# Patient Record
Sex: Female | Born: 1955 | Race: White | Hispanic: No | Marital: Married | Smoking: Former smoker
Health system: Southern US, Community
[De-identification: ages and names within clinical notes are randomized; demographics above are authoritative.]

## PROBLEM LIST (undated history)

## (undated) DIAGNOSIS — Z860100 Personal history of colon polyps, unspecified: Secondary | ICD-10-CM

## (undated) DIAGNOSIS — G4730 Sleep apnea, unspecified: Secondary | ICD-10-CM

## (undated) DIAGNOSIS — F329 Major depressive disorder, single episode, unspecified: Secondary | ICD-10-CM

## (undated) DIAGNOSIS — F32A Depression, unspecified: Secondary | ICD-10-CM

## (undated) DIAGNOSIS — Z9989 Dependence on other enabling machines and devices: Secondary | ICD-10-CM

## (undated) DIAGNOSIS — M1990 Unspecified osteoarthritis, unspecified site: Secondary | ICD-10-CM

## (undated) DIAGNOSIS — I10 Essential (primary) hypertension: Secondary | ICD-10-CM

## (undated) DIAGNOSIS — K760 Fatty (change of) liver, not elsewhere classified: Secondary | ICD-10-CM

## (undated) DIAGNOSIS — K640 First degree hemorrhoids: Secondary | ICD-10-CM

## (undated) DIAGNOSIS — E538 Deficiency of other specified B group vitamins: Secondary | ICD-10-CM

## (undated) DIAGNOSIS — M47816 Spondylosis without myelopathy or radiculopathy, lumbar region: Secondary | ICD-10-CM

## (undated) DIAGNOSIS — M479 Spondylosis, unspecified: Secondary | ICD-10-CM

## (undated) DIAGNOSIS — E039 Hypothyroidism, unspecified: Secondary | ICD-10-CM

## (undated) DIAGNOSIS — J309 Allergic rhinitis, unspecified: Secondary | ICD-10-CM

## (undated) DIAGNOSIS — E7800 Pure hypercholesterolemia, unspecified: Secondary | ICD-10-CM

## (undated) DIAGNOSIS — N200 Calculus of kidney: Secondary | ICD-10-CM

## (undated) DIAGNOSIS — R351 Nocturia: Secondary | ICD-10-CM

## (undated) DIAGNOSIS — Z973 Presence of spectacles and contact lenses: Secondary | ICD-10-CM

## (undated) DIAGNOSIS — K449 Diaphragmatic hernia without obstruction or gangrene: Secondary | ICD-10-CM

## (undated) DIAGNOSIS — G4733 Obstructive sleep apnea (adult) (pediatric): Secondary | ICD-10-CM

## (undated) DIAGNOSIS — G629 Polyneuropathy, unspecified: Secondary | ICD-10-CM

## (undated) DIAGNOSIS — D509 Iron deficiency anemia, unspecified: Secondary | ICD-10-CM

## (undated) DIAGNOSIS — Z87442 Personal history of urinary calculi: Secondary | ICD-10-CM

## (undated) DIAGNOSIS — E119 Type 2 diabetes mellitus without complications: Secondary | ICD-10-CM

## (undated) DIAGNOSIS — J45909 Unspecified asthma, uncomplicated: Secondary | ICD-10-CM

## (undated) DIAGNOSIS — Z85528 Personal history of other malignant neoplasm of kidney: Secondary | ICD-10-CM

## (undated) HISTORY — PX: FRACTURE SURGERY: SHX138

## (undated) HISTORY — PX: OTHER SURGICAL HISTORY: SHX169

## (undated) HISTORY — PX: CARDIOVASCULAR STRESS TEST: SHX262

## (undated) HISTORY — PX: POLYPECTOMY: SHX149

## (undated) HISTORY — DX: Sleep apnea, unspecified: G47.30

## (undated) HISTORY — PX: CARDIAC CATHETERIZATION: SHX172

## (undated) HISTORY — PX: COLONOSCOPY: SHX174

---

## 1991-02-27 HISTORY — PX: KNEE ARTHROSCOPY: SUR90

## 2002-12-29 ENCOUNTER — Other Ambulatory Visit: Admission: RE | Admit: 2002-12-29 | Discharge: 2002-12-29 | Payer: Self-pay | Admitting: Obstetrics and Gynecology

## 2003-01-12 ENCOUNTER — Encounter: Admission: RE | Admit: 2003-01-12 | Discharge: 2003-01-12 | Payer: Self-pay | Admitting: Obstetrics and Gynecology

## 2003-03-24 ENCOUNTER — Encounter: Admission: RE | Admit: 2003-03-24 | Discharge: 2003-03-24 | Payer: Self-pay | Admitting: Chiropractic Medicine

## 2003-12-30 ENCOUNTER — Ambulatory Visit (HOSPITAL_COMMUNITY): Admission: RE | Admit: 2003-12-30 | Discharge: 2003-12-30 | Payer: Self-pay | Admitting: Neurosurgery

## 2003-12-31 ENCOUNTER — Ambulatory Visit: Payer: Self-pay | Admitting: Endocrinology

## 2004-01-03 ENCOUNTER — Other Ambulatory Visit: Admission: RE | Admit: 2004-01-03 | Discharge: 2004-01-03 | Payer: Self-pay | Admitting: Obstetrics and Gynecology

## 2004-01-05 ENCOUNTER — Ambulatory Visit: Payer: Self-pay | Admitting: Endocrinology

## 2004-01-27 ENCOUNTER — Encounter: Admission: RE | Admit: 2004-01-27 | Discharge: 2004-01-27 | Payer: Self-pay | Admitting: Endocrinology

## 2004-04-19 ENCOUNTER — Ambulatory Visit: Payer: Self-pay | Admitting: Internal Medicine

## 2004-08-09 ENCOUNTER — Ambulatory Visit: Payer: Self-pay | Admitting: Endocrinology

## 2004-08-17 ENCOUNTER — Ambulatory Visit: Payer: Self-pay | Admitting: Endocrinology

## 2004-08-30 ENCOUNTER — Ambulatory Visit: Payer: Self-pay

## 2004-08-30 ENCOUNTER — Encounter: Payer: Self-pay | Admitting: Cardiology

## 2004-09-11 ENCOUNTER — Encounter: Payer: Self-pay | Admitting: Pulmonary Disease

## 2004-09-11 ENCOUNTER — Ambulatory Visit (HOSPITAL_BASED_OUTPATIENT_CLINIC_OR_DEPARTMENT_OTHER): Admission: RE | Admit: 2004-09-11 | Discharge: 2004-09-11 | Payer: Self-pay | Admitting: Endocrinology

## 2004-09-18 ENCOUNTER — Ambulatory Visit: Payer: Self-pay | Admitting: Pulmonary Disease

## 2004-11-01 ENCOUNTER — Encounter: Payer: Self-pay | Admitting: Family Medicine

## 2004-11-01 ENCOUNTER — Ambulatory Visit: Payer: Self-pay | Admitting: Pulmonary Disease

## 2004-11-10 ENCOUNTER — Ambulatory Visit: Payer: Self-pay | Admitting: Endocrinology

## 2004-11-16 ENCOUNTER — Ambulatory Visit: Payer: Self-pay | Admitting: Endocrinology

## 2004-12-14 ENCOUNTER — Ambulatory Visit: Payer: Self-pay | Admitting: Endocrinology

## 2004-12-21 ENCOUNTER — Ambulatory Visit: Payer: Self-pay | Admitting: Pulmonary Disease

## 2005-01-05 ENCOUNTER — Other Ambulatory Visit: Admission: RE | Admit: 2005-01-05 | Discharge: 2005-01-05 | Payer: Self-pay | Admitting: Obstetrics and Gynecology

## 2005-01-16 ENCOUNTER — Encounter: Admission: RE | Admit: 2005-01-16 | Discharge: 2005-01-16 | Payer: Self-pay | Admitting: Neurosurgery

## 2005-01-31 ENCOUNTER — Ambulatory Visit: Payer: Self-pay | Admitting: Endocrinology

## 2005-02-02 ENCOUNTER — Encounter: Admission: RE | Admit: 2005-02-02 | Discharge: 2005-02-02 | Payer: Self-pay | Admitting: Neurosurgery

## 2005-02-05 ENCOUNTER — Ambulatory Visit: Payer: Self-pay | Admitting: Pulmonary Disease

## 2005-03-23 ENCOUNTER — Ambulatory Visit: Payer: Self-pay | Admitting: Internal Medicine

## 2005-04-24 ENCOUNTER — Encounter: Admission: RE | Admit: 2005-04-24 | Discharge: 2005-04-24 | Payer: Self-pay | Admitting: Endocrinology

## 2005-08-09 ENCOUNTER — Ambulatory Visit (HOSPITAL_COMMUNITY): Admission: RE | Admit: 2005-08-09 | Discharge: 2005-08-09 | Payer: Self-pay | Admitting: Neurosurgery

## 2005-10-23 ENCOUNTER — Encounter: Admission: RE | Admit: 2005-10-23 | Discharge: 2005-10-23 | Payer: Self-pay | Admitting: Neurosurgery

## 2005-11-13 ENCOUNTER — Encounter: Admission: RE | Admit: 2005-11-13 | Discharge: 2005-11-13 | Payer: Self-pay | Admitting: Neurosurgery

## 2005-11-23 ENCOUNTER — Ambulatory Visit: Payer: Self-pay | Admitting: Endocrinology

## 2005-11-29 ENCOUNTER — Ambulatory Visit: Payer: Self-pay | Admitting: Endocrinology

## 2005-12-27 ENCOUNTER — Ambulatory Visit: Payer: Self-pay | Admitting: Gastroenterology

## 2006-02-22 ENCOUNTER — Ambulatory Visit: Payer: Self-pay | Admitting: Endocrinology

## 2006-04-26 ENCOUNTER — Ambulatory Visit: Payer: Self-pay | Admitting: Family Medicine

## 2006-05-09 ENCOUNTER — Ambulatory Visit: Payer: Self-pay | Admitting: Endocrinology

## 2006-05-22 ENCOUNTER — Ambulatory Visit: Payer: Self-pay | Admitting: Endocrinology

## 2006-07-12 ENCOUNTER — Ambulatory Visit: Payer: Self-pay | Admitting: Internal Medicine

## 2006-09-24 ENCOUNTER — Ambulatory Visit: Payer: Self-pay | Admitting: Endocrinology

## 2006-09-24 LAB — CONVERTED CEMR LAB: TSH: 22.63 microintl units/mL — ABNORMAL HIGH (ref 0.35–5.50)

## 2006-09-27 ENCOUNTER — Encounter: Payer: Self-pay | Admitting: Endocrinology

## 2006-09-27 DIAGNOSIS — I10 Essential (primary) hypertension: Secondary | ICD-10-CM | POA: Insufficient documentation

## 2006-09-27 DIAGNOSIS — J45909 Unspecified asthma, uncomplicated: Secondary | ICD-10-CM | POA: Insufficient documentation

## 2006-10-04 ENCOUNTER — Ambulatory Visit: Payer: Self-pay | Admitting: Endocrinology

## 2006-11-29 ENCOUNTER — Ambulatory Visit: Payer: Self-pay | Admitting: Endocrinology

## 2006-11-29 LAB — CONVERTED CEMR LAB
AST: 56 units/L — ABNORMAL HIGH (ref 0–37)
Bilirubin, Direct: 0.2 mg/dL (ref 0.0–0.3)
Chloride: 101 meq/L (ref 96–112)
Creatinine, Ser: 1.1 mg/dL (ref 0.4–1.2)
Eosinophils Relative: 3.8 % (ref 0.0–5.0)
Glucose, Bld: 129 mg/dL — ABNORMAL HIGH (ref 70–99)
HCT: 39.4 % (ref 36.0–46.0)
Hemoglobin: 13.8 g/dL (ref 12.0–15.0)
Hgb A1c MFr Bld: 7.3 % — ABNORMAL HIGH (ref 4.6–6.0)
LDL Cholesterol: 50 mg/dL (ref 0–99)
MCV: 90.6 fL (ref 78.0–100.0)
Neutrophils Relative %: 66.2 % (ref 43.0–77.0)
RBC: 4.35 M/uL (ref 3.87–5.11)
RDW: 12.5 % (ref 11.5–14.6)
Sodium: 141 meq/L (ref 135–145)
Total Bilirubin: 1.3 mg/dL — ABNORMAL HIGH (ref 0.3–1.2)
Total CHOL/HDL Ratio: 3.3
Total Protein: 7.8 g/dL (ref 6.0–8.3)
WBC: 11.9 10*3/uL — ABNORMAL HIGH (ref 4.5–10.5)

## 2006-12-24 ENCOUNTER — Encounter: Admission: RE | Admit: 2006-12-24 | Discharge: 2006-12-24 | Payer: Self-pay | Admitting: Endocrinology

## 2007-01-07 ENCOUNTER — Telehealth (INDEPENDENT_AMBULATORY_CARE_PROVIDER_SITE_OTHER): Payer: Self-pay | Admitting: *Deleted

## 2007-01-07 ENCOUNTER — Ambulatory Visit: Payer: Self-pay | Admitting: Endocrinology

## 2007-01-07 DIAGNOSIS — E039 Hypothyroidism, unspecified: Secondary | ICD-10-CM | POA: Insufficient documentation

## 2007-01-15 ENCOUNTER — Ambulatory Visit: Payer: Self-pay | Admitting: Endocrinology

## 2007-01-15 DIAGNOSIS — E876 Hypokalemia: Secondary | ICD-10-CM | POA: Insufficient documentation

## 2007-01-17 ENCOUNTER — Telehealth: Payer: Self-pay | Admitting: Endocrinology

## 2007-01-20 ENCOUNTER — Encounter: Payer: Self-pay | Admitting: Endocrinology

## 2007-04-18 ENCOUNTER — Ambulatory Visit: Payer: Self-pay | Admitting: Endocrinology

## 2007-04-18 DIAGNOSIS — E119 Type 2 diabetes mellitus without complications: Secondary | ICD-10-CM | POA: Insufficient documentation

## 2007-04-20 LAB — CONVERTED CEMR LAB
CO2: 29 meq/L (ref 19–32)
Creatinine, Ser: 1.2 mg/dL (ref 0.4–1.2)
Potassium: 4 meq/L (ref 3.5–5.1)
Sodium: 139 meq/L (ref 135–145)
TSH: 10.14 microintl units/mL — ABNORMAL HIGH (ref 0.35–5.50)

## 2007-04-22 ENCOUNTER — Ambulatory Visit: Payer: Self-pay | Admitting: Endocrinology

## 2007-05-14 ENCOUNTER — Telehealth: Payer: Self-pay | Admitting: Endocrinology

## 2007-06-18 ENCOUNTER — Telehealth (INDEPENDENT_AMBULATORY_CARE_PROVIDER_SITE_OTHER): Payer: Self-pay | Admitting: *Deleted

## 2007-06-18 ENCOUNTER — Ambulatory Visit: Payer: Self-pay | Admitting: Endocrinology

## 2007-06-18 DIAGNOSIS — M542 Cervicalgia: Secondary | ICD-10-CM

## 2007-06-18 LAB — CONVERTED CEMR LAB
BUN: 24 mg/dL — ABNORMAL HIGH (ref 6–23)
CO2: 31 meq/L (ref 19–32)
Chloride: 103 meq/L (ref 96–112)
Creatinine, Ser: 1.7 mg/dL — ABNORMAL HIGH (ref 0.4–1.2)
Hgb A1c MFr Bld: 7.4 % — ABNORMAL HIGH (ref 4.6–6.0)

## 2007-06-25 ENCOUNTER — Encounter: Admission: RE | Admit: 2007-06-25 | Discharge: 2007-06-25 | Payer: Self-pay | Admitting: Endocrinology

## 2007-06-26 ENCOUNTER — Telehealth (INDEPENDENT_AMBULATORY_CARE_PROVIDER_SITE_OTHER): Payer: Self-pay | Admitting: *Deleted

## 2007-07-02 ENCOUNTER — Encounter: Payer: Self-pay | Admitting: Endocrinology

## 2007-08-04 ENCOUNTER — Encounter: Payer: Self-pay | Admitting: Endocrinology

## 2007-08-04 ENCOUNTER — Telehealth: Payer: Self-pay | Admitting: Endocrinology

## 2007-08-16 ENCOUNTER — Ambulatory Visit: Payer: Self-pay | Admitting: Internal Medicine

## 2007-08-16 ENCOUNTER — Telehealth: Payer: Self-pay | Admitting: Internal Medicine

## 2007-08-16 DIAGNOSIS — J209 Acute bronchitis, unspecified: Secondary | ICD-10-CM | POA: Insufficient documentation

## 2007-09-15 ENCOUNTER — Ambulatory Visit: Payer: Self-pay | Admitting: Endocrinology

## 2007-09-15 LAB — CONVERTED CEMR LAB: TSH: 0.5 microintl units/mL (ref 0.35–5.50)

## 2007-09-22 ENCOUNTER — Telehealth (INDEPENDENT_AMBULATORY_CARE_PROVIDER_SITE_OTHER): Payer: Self-pay | Admitting: *Deleted

## 2007-09-24 ENCOUNTER — Telehealth (INDEPENDENT_AMBULATORY_CARE_PROVIDER_SITE_OTHER): Payer: Self-pay | Admitting: *Deleted

## 2007-10-07 ENCOUNTER — Telehealth (INDEPENDENT_AMBULATORY_CARE_PROVIDER_SITE_OTHER): Payer: Self-pay | Admitting: *Deleted

## 2007-10-28 ENCOUNTER — Ambulatory Visit: Payer: Self-pay | Admitting: Endocrinology

## 2007-10-31 ENCOUNTER — Telehealth (INDEPENDENT_AMBULATORY_CARE_PROVIDER_SITE_OTHER): Payer: Self-pay | Admitting: *Deleted

## 2007-11-23 ENCOUNTER — Emergency Department (HOSPITAL_COMMUNITY): Admission: EM | Admit: 2007-11-23 | Discharge: 2007-11-23 | Payer: Self-pay | Admitting: Emergency Medicine

## 2007-12-11 ENCOUNTER — Ambulatory Visit: Payer: Self-pay | Admitting: Endocrinology

## 2007-12-14 LAB — CONVERTED CEMR LAB
Hgb A1c MFr Bld: 5.6 %
TSH: 0.04 u[IU]/mL — ABNORMAL LOW

## 2007-12-16 ENCOUNTER — Ambulatory Visit: Payer: Self-pay | Admitting: Endocrinology

## 2007-12-16 DIAGNOSIS — G4730 Sleep apnea, unspecified: Secondary | ICD-10-CM | POA: Insufficient documentation

## 2007-12-26 ENCOUNTER — Ambulatory Visit: Payer: Self-pay | Admitting: Pulmonary Disease

## 2007-12-29 ENCOUNTER — Ambulatory Visit: Payer: Self-pay | Admitting: Pulmonary Disease

## 2007-12-29 DIAGNOSIS — J309 Allergic rhinitis, unspecified: Secondary | ICD-10-CM | POA: Insufficient documentation

## 2008-01-26 ENCOUNTER — Ambulatory Visit: Payer: Self-pay | Admitting: Pulmonary Disease

## 2008-02-17 ENCOUNTER — Telehealth: Payer: Self-pay | Admitting: Endocrinology

## 2008-03-06 ENCOUNTER — Encounter: Payer: Self-pay | Admitting: Pulmonary Disease

## 2008-03-11 ENCOUNTER — Encounter: Admission: RE | Admit: 2008-03-11 | Discharge: 2008-03-11 | Payer: Self-pay | Admitting: Endocrinology

## 2008-03-16 ENCOUNTER — Ambulatory Visit: Payer: Self-pay | Admitting: Endocrinology

## 2008-03-16 LAB — CONVERTED CEMR LAB
ALT: 53 units/L — ABNORMAL HIGH (ref 0–35)
AST: 46 units/L — ABNORMAL HIGH (ref 0–37)
Basophils Relative: 0.7 % (ref 0.0–3.0)
Bilirubin, Direct: 0.1 mg/dL (ref 0.0–0.3)
CO2: 26 meq/L (ref 19–32)
Calcium: 9.2 mg/dL (ref 8.4–10.5)
Chloride: 111 meq/L (ref 96–112)
Creatinine, Ser: 1.2 mg/dL (ref 0.4–1.2)
Creatinine,U: 579.7 mg/dL
Eosinophils Relative: 6 % — ABNORMAL HIGH (ref 0.0–5.0)
Glucose, Bld: 120 mg/dL — ABNORMAL HIGH (ref 70–99)
Hemoglobin: 11.2 g/dL — ABNORMAL LOW (ref 12.0–15.0)
Ketones, ur: 15 mg/dL — AB
Lymphocytes Relative: 28.1 % (ref 12.0–46.0)
Microalb, Ur: 11.4 mg/dL — ABNORMAL HIGH (ref 0.0–1.9)
Monocytes Relative: 8.2 % (ref 3.0–12.0)
Neutro Abs: 4.2 10*3/uL (ref 1.4–7.7)
Nitrite: NEGATIVE
RBC: 3.66 M/uL — ABNORMAL LOW (ref 3.87–5.11)
Specific Gravity, Urine: >=1.03 (ref 1.000–1.03)
TSH: 0.05 microintl units/mL — ABNORMAL LOW (ref 0.35–5.50)
Total CHOL/HDL Ratio: 3.1
Total Protein: 6.7 g/dL (ref 6.0–8.3)
Triglycerides: 139 mg/dL (ref 0–149)
WBC: 7.4 10*3/uL (ref 4.5–10.5)
pH: 5 (ref 5.0–8.0)

## 2008-03-19 ENCOUNTER — Ambulatory Visit: Payer: Self-pay | Admitting: Endocrinology

## 2008-03-19 DIAGNOSIS — Z780 Asymptomatic menopausal state: Secondary | ICD-10-CM | POA: Insufficient documentation

## 2008-03-19 DIAGNOSIS — K7581 Nonalcoholic steatohepatitis (NASH): Secondary | ICD-10-CM

## 2008-03-19 DIAGNOSIS — S93409A Sprain of unspecified ligament of unspecified ankle, initial encounter: Secondary | ICD-10-CM | POA: Insufficient documentation

## 2008-03-20 IMAGING — MG MM DIGITAL SCREENING BILATERAL
4 series · 4 of 4 positions shown · non-contrast
Comparison: Prior studies.

DG SCREEN MAMMOGRAM BILATERAL
Bilateral CC and MLO view(s) were taken.

DIGITAL SCREENING MAMMOGRAM WITH CAD:

[R CC]
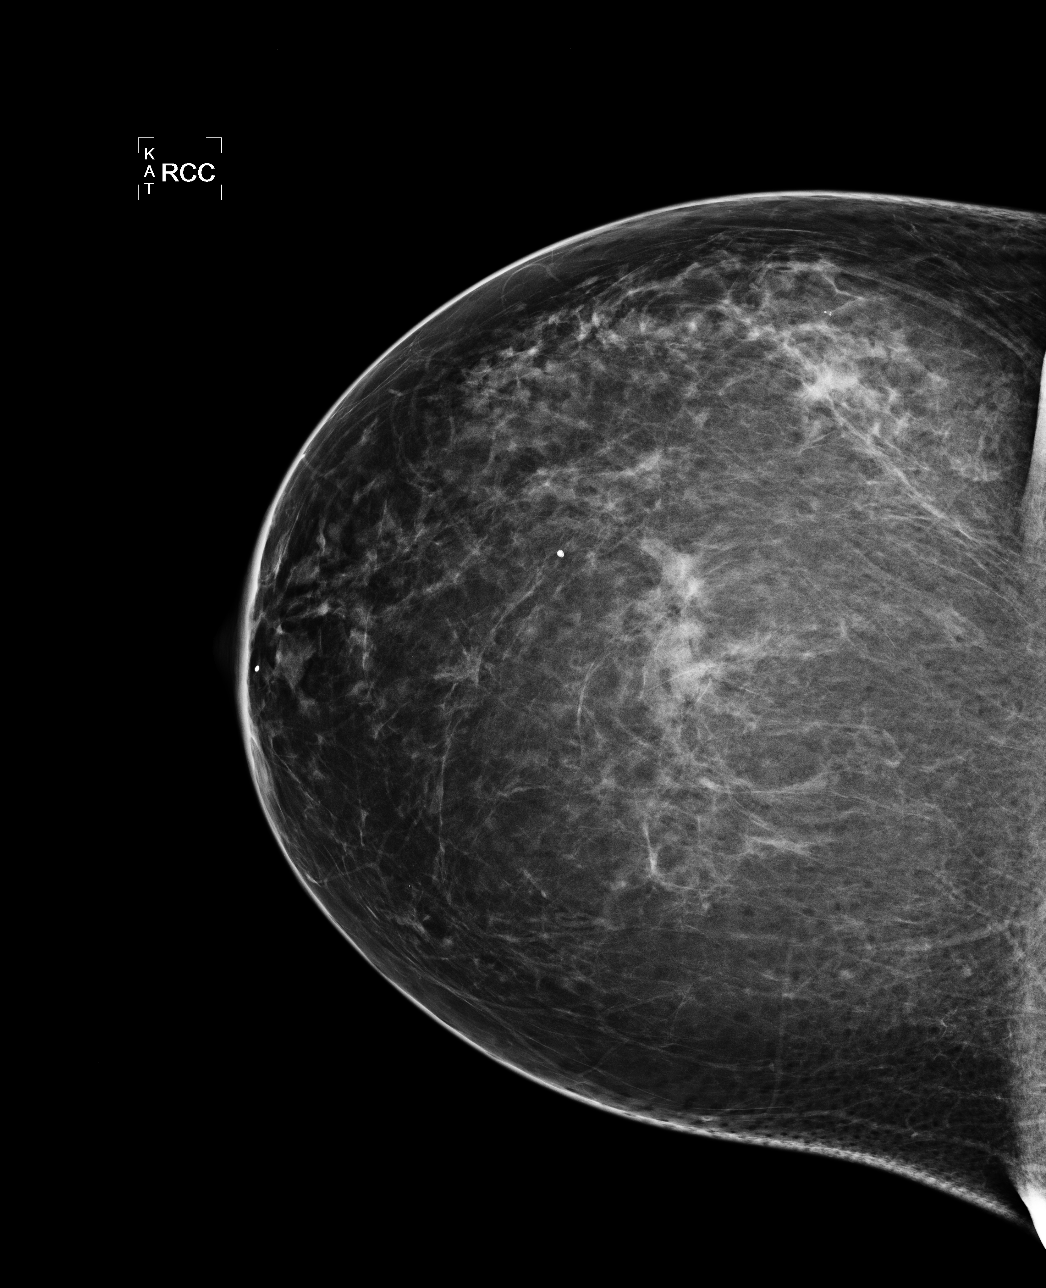

[L CC]
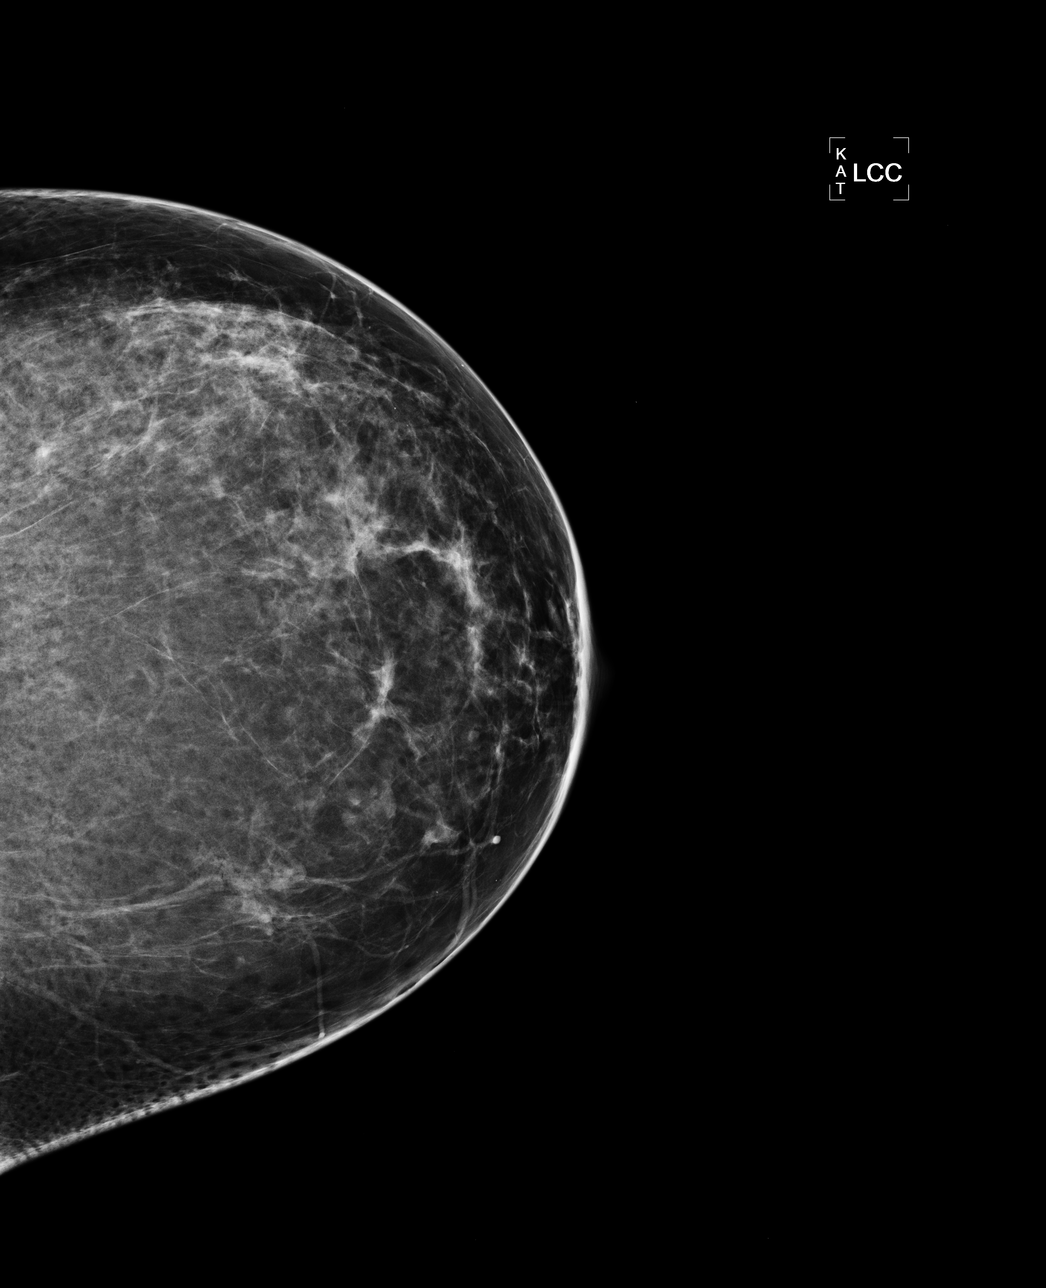

[L MLO]
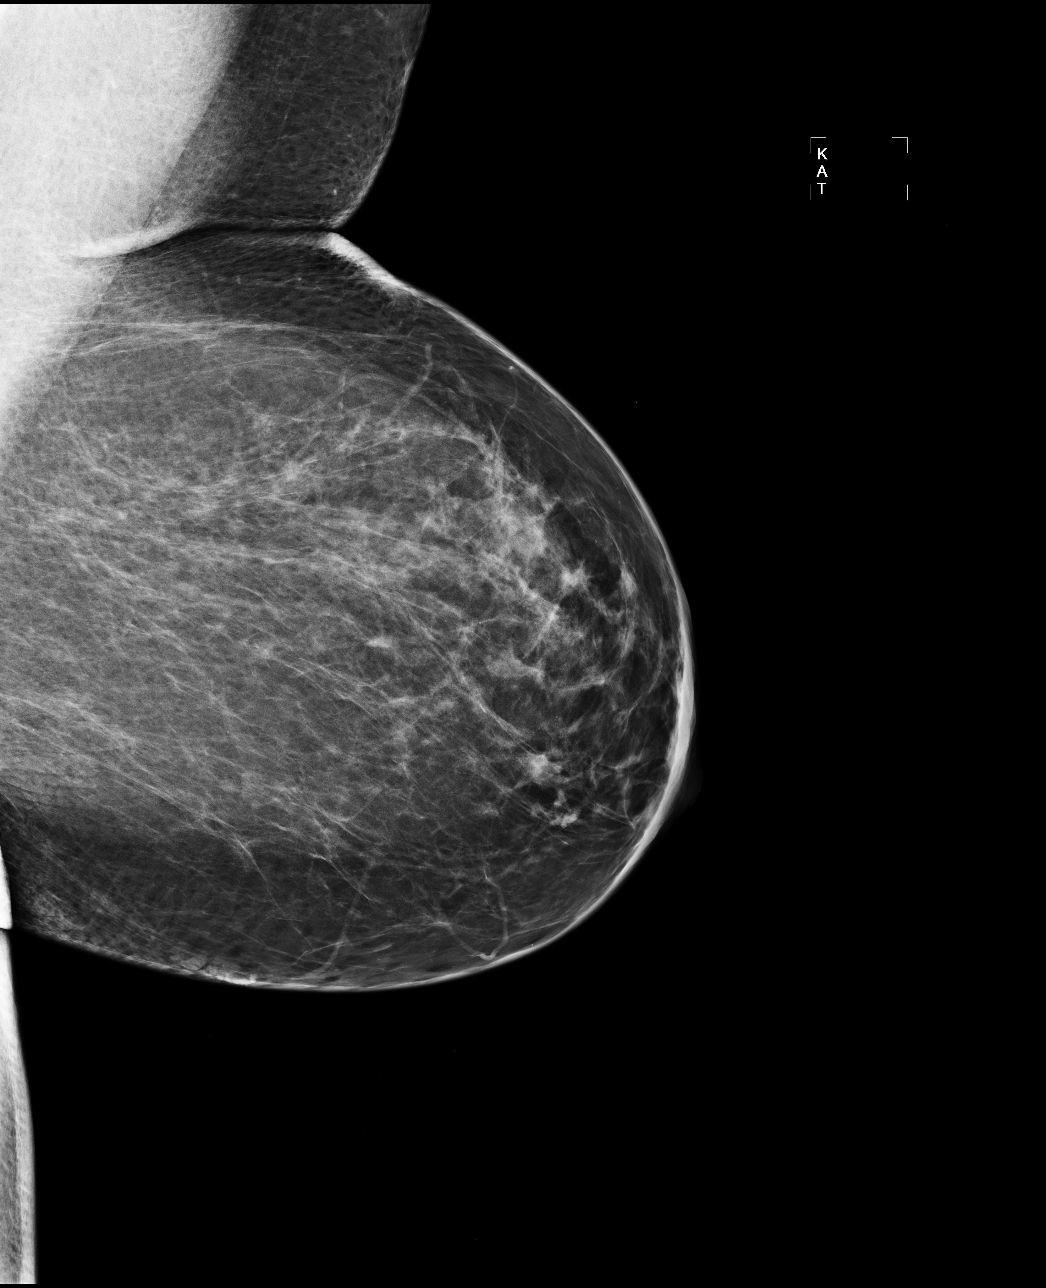

[R MLO]
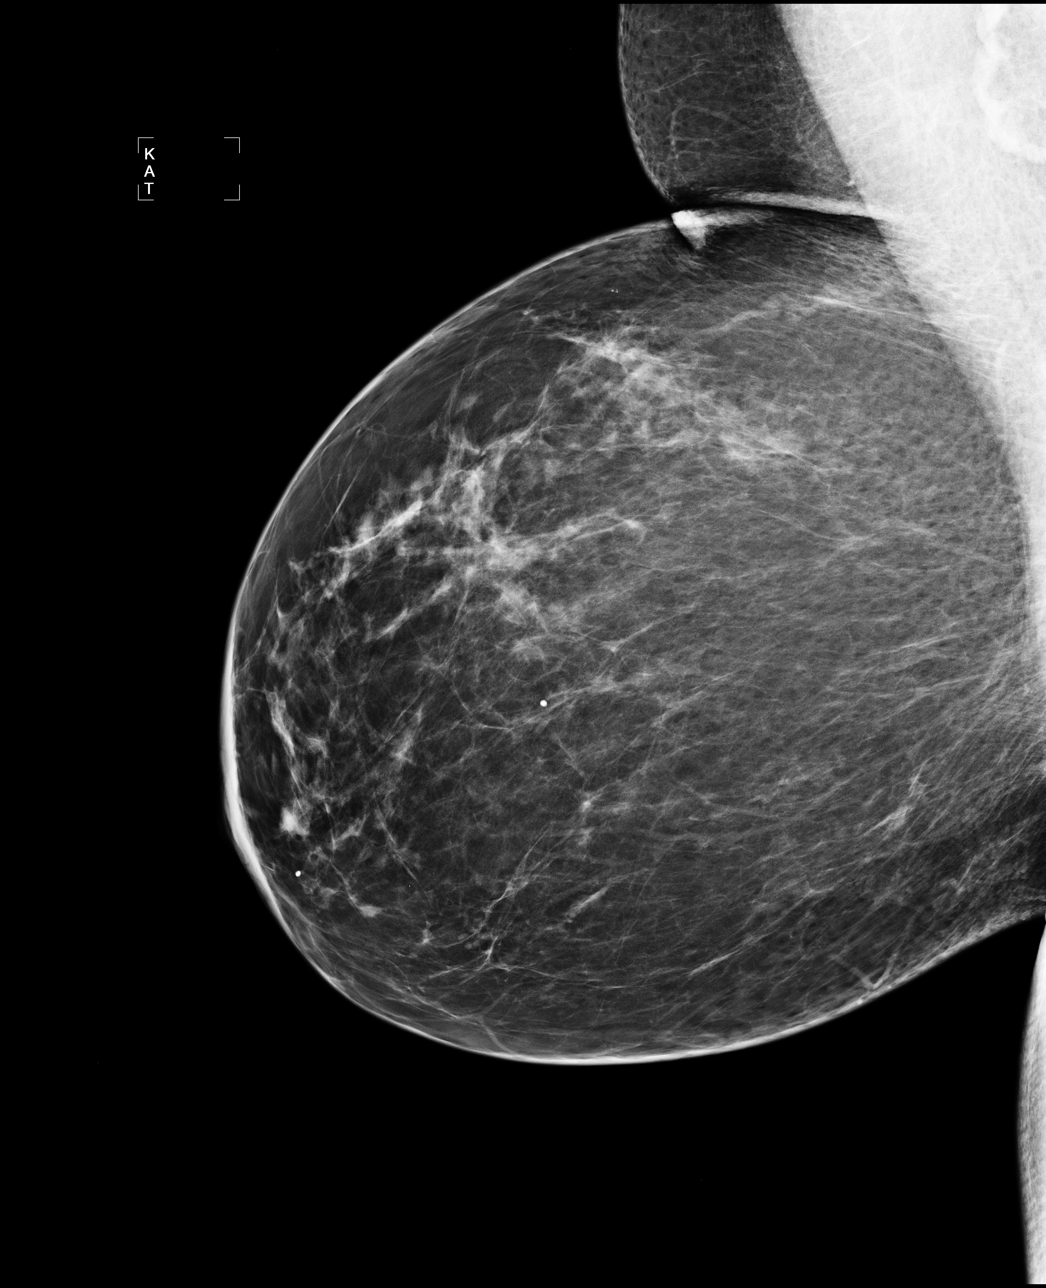

[4 of 4 positions shown; findings below may reference images not displayed]

There are scattered fibroglandular densities.  There is no dominant mass, architectural distortion 
or calcification to suggest malignancy.
IMPRESSION: No mammographic evidence of malignancy.  Suggest yearly screening mammography.

ASSESSMENT: Negative - BI-RADS 1

Screening mammogram in 1 year.
ANALYZED BY COMPUTER AIDED DETECTION. , THIS PROCEDURE WAS A DIGITAL MAMMOGRAM.

## 2008-04-16 ENCOUNTER — Ambulatory Visit: Payer: Self-pay | Admitting: Family Medicine

## 2008-04-16 ENCOUNTER — Encounter: Payer: Self-pay | Admitting: Endocrinology

## 2008-04-23 ENCOUNTER — Ambulatory Visit: Payer: Self-pay | Admitting: Gastroenterology

## 2008-04-26 ENCOUNTER — Telehealth (INDEPENDENT_AMBULATORY_CARE_PROVIDER_SITE_OTHER): Payer: Self-pay | Admitting: *Deleted

## 2008-04-30 ENCOUNTER — Telehealth: Payer: Self-pay | Admitting: Endocrinology

## 2008-05-07 ENCOUNTER — Ambulatory Visit: Payer: Self-pay | Admitting: Gastroenterology

## 2008-05-07 LAB — HM COLONOSCOPY

## 2008-05-12 ENCOUNTER — Ambulatory Visit: Payer: Self-pay | Admitting: Endocrinology

## 2008-05-13 LAB — CONVERTED CEMR LAB
Basophils Absolute: 0.1 10*3/uL (ref 0.0–0.1)
Basophils Relative: 0.7 % (ref 0.0–3.0)
HCT: 33.7 % — ABNORMAL LOW (ref 36.0–46.0)
Hemoglobin: 11.5 g/dL — ABNORMAL LOW (ref 12.0–15.0)
Lymphocytes Relative: 30.1 % (ref 12.0–46.0)
Lymphs Abs: 2.3 10*3/uL (ref 0.7–4.0)
Monocytes Relative: 5.8 % (ref 3.0–12.0)
Neutro Abs: 4.5 10*3/uL (ref 1.4–7.7)
RBC: 3.82 M/uL — ABNORMAL LOW (ref 3.87–5.11)
RDW: 12.8 % (ref 11.5–14.6)
TSH: 0.05 microintl units/mL — ABNORMAL LOW (ref 0.35–5.50)

## 2008-07-09 ENCOUNTER — Telehealth: Payer: Self-pay | Admitting: Endocrinology

## 2008-09-03 ENCOUNTER — Telehealth (INDEPENDENT_AMBULATORY_CARE_PROVIDER_SITE_OTHER): Payer: Self-pay | Admitting: *Deleted

## 2008-09-16 ENCOUNTER — Ambulatory Visit: Payer: Self-pay | Admitting: Pulmonary Disease

## 2008-12-03 ENCOUNTER — Telehealth: Payer: Self-pay | Admitting: Endocrinology

## 2008-12-06 ENCOUNTER — Ambulatory Visit: Payer: Self-pay | Admitting: Endocrinology

## 2008-12-06 DIAGNOSIS — R42 Dizziness and giddiness: Secondary | ICD-10-CM | POA: Insufficient documentation

## 2008-12-06 DIAGNOSIS — D509 Iron deficiency anemia, unspecified: Secondary | ICD-10-CM

## 2009-01-03 ENCOUNTER — Encounter: Payer: Self-pay | Admitting: Endocrinology

## 2009-01-17 ENCOUNTER — Telehealth: Payer: Self-pay | Admitting: Endocrinology

## 2009-01-25 ENCOUNTER — Telehealth: Payer: Self-pay | Admitting: Endocrinology

## 2009-01-31 ENCOUNTER — Telehealth: Payer: Self-pay | Admitting: Endocrinology

## 2009-02-08 ENCOUNTER — Ambulatory Visit: Payer: Self-pay | Admitting: Endocrinology

## 2009-03-02 ENCOUNTER — Telehealth: Payer: Self-pay | Admitting: Endocrinology

## 2009-03-23 ENCOUNTER — Encounter: Admission: RE | Admit: 2009-03-23 | Discharge: 2009-03-23 | Payer: Self-pay | Admitting: Orthopedic Surgery

## 2009-04-15 ENCOUNTER — Ambulatory Visit: Payer: Self-pay | Admitting: Endocrinology

## 2009-04-15 LAB — CONVERTED CEMR LAB
ALT: 56 units/L — ABNORMAL HIGH (ref 0–35)
Albumin: 4.3 g/dL (ref 3.5–5.2)
Alkaline Phosphatase: 62 units/L (ref 39–117)
BUN: 21 mg/dL (ref 6–23)
Basophils Relative: 0.6 % (ref 0.0–3.0)
Bilirubin, Direct: 0.3 mg/dL (ref 0.0–0.3)
Cholesterol: 109 mg/dL (ref 0–200)
Creatinine, Ser: 1.3 mg/dL — ABNORMAL HIGH (ref 0.4–1.2)
Eosinophils Relative: 1.9 % (ref 0.0–5.0)
GFR calc non Af Amer: 45.26 mL/min (ref >60)
Glucose, Bld: 120 mg/dL — ABNORMAL HIGH (ref 70–99)
Hemoglobin: 12.4 g/dL (ref 12.0–15.0)
Hgb A1c MFr Bld: 6.7 % — ABNORMAL HIGH (ref 4.6–6.5)
LDL Cholesterol: 35 mg/dL (ref 0–99)
Lymphocytes Relative: 36.4 % (ref 12.0–46.0)
MCV: 91.8 fL (ref 78.0–100.0)
Microalb, Ur: 8.7 mg/dL — ABNORMAL HIGH (ref 0.0–1.9)
Neutro Abs: 4.3 10*3/uL (ref 1.4–7.7)
Neutrophils Relative %: 54.2 % (ref 43.0–77.0)
Potassium: 3.7 meq/L (ref 3.5–5.1)
RBC: 3.99 M/uL (ref 3.87–5.11)
Total Protein, Urine: 30 mg/dL
Total Protein: 7.2 g/dL (ref 6.0–8.3)
Urine Glucose: NEGATIVE mg/dL
VLDL: 29 mg/dL (ref 0.0–40.0)
WBC: 7.7 10*3/uL (ref 4.5–10.5)

## 2009-04-18 ENCOUNTER — Ambulatory Visit: Payer: Self-pay | Admitting: Endocrinology

## 2009-04-18 ENCOUNTER — Telehealth (INDEPENDENT_AMBULATORY_CARE_PROVIDER_SITE_OTHER): Payer: Self-pay | Admitting: *Deleted

## 2009-04-18 ENCOUNTER — Telehealth: Payer: Self-pay | Admitting: Endocrinology

## 2009-04-18 DIAGNOSIS — M47816 Spondylosis without myelopathy or radiculopathy, lumbar region: Secondary | ICD-10-CM | POA: Insufficient documentation

## 2009-04-18 DIAGNOSIS — M479 Spondylosis, unspecified: Secondary | ICD-10-CM

## 2009-04-18 DIAGNOSIS — R079 Chest pain, unspecified: Secondary | ICD-10-CM

## 2009-04-18 DIAGNOSIS — E7800 Pure hypercholesterolemia, unspecified: Secondary | ICD-10-CM

## 2009-04-27 ENCOUNTER — Encounter: Payer: Self-pay | Admitting: Endocrinology

## 2009-05-02 ENCOUNTER — Telehealth (INDEPENDENT_AMBULATORY_CARE_PROVIDER_SITE_OTHER): Payer: Self-pay | Admitting: *Deleted

## 2009-05-10 ENCOUNTER — Telehealth (INDEPENDENT_AMBULATORY_CARE_PROVIDER_SITE_OTHER): Payer: Self-pay | Admitting: *Deleted

## 2009-05-11 ENCOUNTER — Ambulatory Visit: Payer: Self-pay | Admitting: Cardiology

## 2009-05-11 ENCOUNTER — Encounter (HOSPITAL_COMMUNITY): Admission: RE | Admit: 2009-05-11 | Discharge: 2009-06-29 | Payer: Self-pay | Admitting: Endocrinology

## 2009-05-11 ENCOUNTER — Ambulatory Visit: Payer: Self-pay

## 2009-05-13 ENCOUNTER — Telehealth (INDEPENDENT_AMBULATORY_CARE_PROVIDER_SITE_OTHER): Payer: Self-pay | Admitting: *Deleted

## 2009-05-19 ENCOUNTER — Telehealth: Payer: Self-pay | Admitting: Endocrinology

## 2009-05-19 ENCOUNTER — Encounter: Admission: RE | Admit: 2009-05-19 | Discharge: 2009-05-19 | Payer: Self-pay | Admitting: Endocrinology

## 2009-05-19 LAB — HM MAMMOGRAPHY: HM Mammogram: NORMAL

## 2009-05-23 ENCOUNTER — Ambulatory Visit: Payer: Self-pay | Admitting: Cardiovascular Disease

## 2009-05-23 DIAGNOSIS — R002 Palpitations: Secondary | ICD-10-CM | POA: Insufficient documentation

## 2009-06-01 ENCOUNTER — Ambulatory Visit: Payer: Self-pay | Admitting: Cardiovascular Disease

## 2009-06-01 ENCOUNTER — Inpatient Hospital Stay (HOSPITAL_BASED_OUTPATIENT_CLINIC_OR_DEPARTMENT_OTHER): Admission: RE | Admit: 2009-06-01 | Discharge: 2009-06-01 | Payer: Self-pay | Admitting: Cardiovascular Disease

## 2009-06-09 ENCOUNTER — Emergency Department (HOSPITAL_COMMUNITY): Admission: EM | Admit: 2009-06-09 | Discharge: 2009-06-09 | Payer: Self-pay | Admitting: Emergency Medicine

## 2009-06-14 ENCOUNTER — Ambulatory Visit: Payer: Self-pay | Admitting: Internal Medicine

## 2009-06-14 DIAGNOSIS — H6690 Otitis media, unspecified, unspecified ear: Secondary | ICD-10-CM | POA: Insufficient documentation

## 2009-06-27 ENCOUNTER — Telehealth: Payer: Self-pay | Admitting: Endocrinology

## 2009-06-28 ENCOUNTER — Ambulatory Visit: Payer: Self-pay | Admitting: Endocrinology

## 2009-06-28 DIAGNOSIS — R319 Hematuria, unspecified: Secondary | ICD-10-CM

## 2009-06-28 LAB — CONVERTED CEMR LAB
Basophils Absolute: 0 10*3/uL (ref 0.0–0.1)
Eosinophils Absolute: 0.3 10*3/uL (ref 0.0–0.7)
Leukocytes, UA: NEGATIVE
Lymphocytes Relative: 32.5 % (ref 12.0–46.0)
MCHC: 34.8 g/dL (ref 30.0–36.0)
Neutrophils Relative %: 55.5 % (ref 43.0–77.0)
Nitrite: NEGATIVE
Platelets: 169 10*3/uL (ref 150.0–400.0)
RDW: 14.3 % (ref 11.5–14.6)
Saturation Ratios: 17.3 % — ABNORMAL LOW (ref 20.0–50.0)
Specific Gravity, Urine: >=1.03 (ref 1.000–1.030)
Transferrin: 264.1 mg/dL (ref 212.0–360.0)
pH: 5.5 (ref 5.0–8.0)

## 2009-07-01 ENCOUNTER — Encounter: Payer: Self-pay | Admitting: Endocrinology

## 2009-07-13 ENCOUNTER — Ambulatory Visit: Payer: Self-pay | Admitting: Endocrinology

## 2009-07-14 LAB — CONVERTED CEMR LAB

## 2009-07-19 ENCOUNTER — Ambulatory Visit: Payer: Self-pay | Admitting: Cardiovascular Disease

## 2009-08-09 ENCOUNTER — Ambulatory Visit: Payer: Self-pay | Admitting: Pulmonary Disease

## 2009-08-09 DIAGNOSIS — G4733 Obstructive sleep apnea (adult) (pediatric): Secondary | ICD-10-CM

## 2009-08-16 ENCOUNTER — Encounter: Payer: Self-pay | Admitting: Endocrinology

## 2009-09-26 LAB — CONVERTED CEMR LAB: Pap Smear: NORMAL

## 2009-09-26 LAB — HM PAP SMEAR

## 2009-11-08 ENCOUNTER — Ambulatory Visit: Payer: Self-pay | Admitting: Endocrinology

## 2009-11-08 DIAGNOSIS — N209 Urinary calculus, unspecified: Secondary | ICD-10-CM | POA: Insufficient documentation

## 2009-11-08 LAB — CONVERTED CEMR LAB
Basophils Absolute: 0 10*3/uL (ref 0.0–0.1)
Hemoglobin: 12.1 g/dL (ref 12.0–15.0)
Iron: 82 ug/dL (ref 42–145)
Lymphocytes Relative: 33 % (ref 12.0–46.0)
Monocytes Relative: 5.5 % (ref 3.0–12.0)
Neutro Abs: 3.1 10*3/uL (ref 1.4–7.7)
Neutrophils Relative %: 57.8 % (ref 43.0–77.0)
RBC: 3.88 M/uL (ref 3.87–5.11)
RDW: 13.7 % (ref 11.5–14.6)
Saturation Ratios: 17.1 % — ABNORMAL LOW (ref 20.0–50.0)
Transferrin: 343 mg/dL (ref 212.0–360.0)

## 2009-11-10 ENCOUNTER — Telehealth: Payer: Self-pay | Admitting: Endocrinology

## 2009-11-12 ENCOUNTER — Encounter: Payer: Self-pay | Admitting: Pulmonary Disease

## 2009-11-22 ENCOUNTER — Telehealth: Payer: Self-pay | Admitting: Pulmonary Disease

## 2009-12-02 ENCOUNTER — Telehealth: Payer: Self-pay | Admitting: Internal Medicine

## 2009-12-26 ENCOUNTER — Ambulatory Visit: Payer: Self-pay | Admitting: Endocrinology

## 2009-12-29 ENCOUNTER — Ambulatory Visit: Payer: Self-pay | Admitting: Endocrinology

## 2009-12-29 DIAGNOSIS — M545 Low back pain, unspecified: Secondary | ICD-10-CM | POA: Insufficient documentation

## 2009-12-29 LAB — CONVERTED CEMR LAB: Sed Rate: 21 mm/hr (ref 0–22)

## 2010-02-07 ENCOUNTER — Ambulatory Visit: Payer: Self-pay | Admitting: Endocrinology

## 2010-02-20 ENCOUNTER — Emergency Department (HOSPITAL_COMMUNITY)
Admission: EM | Admit: 2010-02-20 | Discharge: 2010-02-20 | Payer: Self-pay | Source: Home / Self Care | Admitting: Emergency Medicine

## 2010-03-07 ENCOUNTER — Encounter: Payer: Self-pay | Admitting: Endocrinology

## 2010-03-07 ENCOUNTER — Ambulatory Visit
Admission: RE | Admit: 2010-03-07 | Discharge: 2010-03-07 | Payer: Self-pay | Source: Home / Self Care | Attending: Endocrinology | Admitting: Endocrinology

## 2010-03-07 LAB — CONVERTED CEMR LAB
Nitrite: NEGATIVE
Protein, U semiquant: 100
pH: 5

## 2010-03-10 ENCOUNTER — Encounter: Payer: Self-pay | Admitting: Endocrinology

## 2010-03-14 ENCOUNTER — Telehealth: Payer: Self-pay | Admitting: Endocrinology

## 2010-03-19 ENCOUNTER — Encounter: Payer: Self-pay | Admitting: Endocrinology

## 2010-03-23 ENCOUNTER — Other Ambulatory Visit (HOSPITAL_COMMUNITY): Payer: Self-pay | Admitting: Surgery

## 2010-03-23 ENCOUNTER — Encounter: Payer: Self-pay | Admitting: Endocrinology

## 2010-03-23 DIAGNOSIS — E0590 Thyrotoxicosis, unspecified without thyrotoxic crisis or storm: Secondary | ICD-10-CM

## 2010-03-26 LAB — CONVERTED CEMR LAB
BUN: 21 mg/dL (ref 6–23)
Basophils Absolute: 0.1 10*3/uL (ref 0.0–0.1)
Basophils Relative: 0.5 % (ref 0.0–3.0)
CO2: 27 meq/L (ref 19–32)
CO2: 28 meq/L (ref 19–32)
Calcium: 8.9 mg/dL (ref 8.4–10.5)
Chloride: 101 meq/L (ref 96–112)
Chloride: 106 meq/L (ref 96–112)
Eosinophils Absolute: 0.3 10*3/uL (ref 0.0–0.7)
Glucose, Bld: 205 mg/dL — ABNORMAL HIGH (ref 70–99)
HCT: 33.3 % — ABNORMAL LOW (ref 36.0–46.0)
HCT: 34.3 % — ABNORMAL LOW (ref 36.0–46.0)
Hemoglobin: 10.8 g/dL — ABNORMAL LOW (ref 12.0–15.0)
Hemoglobin: 11.5 g/dL — ABNORMAL LOW (ref 12.0–15.0)
Lymphocytes Relative: 30.7 % (ref 12.0–46.0)
Lymphs Abs: 1.8 10*3/uL (ref 0.7–4.0)
MCHC: 32.4 g/dL (ref 30.0–36.0)
MCHC: 33.6 g/dL (ref 30.0–36.0)
MCV: 91.2 fL (ref 78.0–100.0)
Monocytes Absolute: 0.4 10*3/uL (ref 0.1–1.0)
Monocytes Relative: 6.7 % (ref 3.0–12.0)
Neutro Abs: 3.6 10*3/uL (ref 1.4–7.7)
Neutro Abs: 3.8 10*3/uL (ref 1.4–7.7)
Platelets: 181 10*3/uL (ref 150.0–400.0)
Potassium: 4 meq/L (ref 3.5–5.1)
Potassium: 4.3 meq/L (ref 3.5–5.1)
RBC: 3.62 M/uL — ABNORMAL LOW (ref 3.87–5.11)
RDW: 12.6 % (ref 11.5–14.6)
Sodium: 139 meq/L (ref 135–145)
Sodium: 142 meq/L (ref 135–145)
TSH: 0.19 microintl units/mL — ABNORMAL LOW (ref 0.35–5.50)
aPTT: 29.8 s — ABNORMAL HIGH (ref 21.7–28.8)

## 2010-03-28 NOTE — Miscellaneous (Signed)
Summary: optimal pressure 12cm.  Clinical Lists Changes  Orders: Added new Referral order of DME Referral (DME) - Signed auto shows great compliance, no leak, optimal pressure 12cm.

## 2010-03-28 NOTE — Letter (Signed)
Summary: [facility]  [facility]   Imported By: Lester Clarence 04/29/2009 11:24:25  _____________________________________________________________________  External Attachment:    Type:   Image     Comment:   External Document

## 2010-03-28 NOTE — Progress Notes (Signed)
Summary: c pap order   [id number]  Phone Note Call from Patient   Caller: Patient Call For: clance Summary of Call: pt need [facility] Initial call taken by: Rickard Patience,  November 22, 2009 2:56 PM  Follow-up for Phone Call        ATC pt at home #.  NA and unable to leave message.  ATC pt at work #.  [id number]. . Just need to get more info on what pt is exactly needing.  Aundra Millet Reynolds LPN  November 22, 2009 3:06 PM   Spoke with pt and she states she took her machine in to [facility] to have it adjusted and they told her is has too many hours on it and that she needs a new machine and for her to contact Dr. Shelle Iron for a rx for new machine. Please advise if ok to send rx for new cpap machine. Carron Curie CMA  November 22, 2009 4:25 PM   Additional Follow-up for Phone Call Additional follow up Details #1::        order sent to [facility]. Additional Follow-up by: Barbaraann Share MD,  November 23, 2009 1:57 PM

## 2010-03-28 NOTE — Assessment & Plan Note (Signed)
Summary: np6/chest pain/jml  Medications Added LOSARTAN POTASSIUM-HCTZ 100-12.5 MG TABS (LOSARTAN POTASSIUM-HCTZ) Take one tablet by mouth once daily.      Allergies Added:   Visit Type:  Initial Consult Referring Provider:  Romero Belling Primary Provider:  Minus Breeding MD  CC:  chest pain and palpitations.  History of Present Illness: 55 yo white female with history of HTN, hyperlipidemia and DM who is referred today as a new patient for further evaluation of chest pain. She has recently undergone a Lexiscan stress myoview that demonstrated possible ischemia in the distal anteroseptal wall and apex. This could represent breast attenuation artifact or ischemia. She tells me that she has been under much stress lately. She describes "fluttering" of her heart when sitting around. This lasts for a few seconds. This makes her feel like she can't catch her breath. She has had no dizziness, near syncope or syncope. This occurs several times per month, especially when she is stressed out. Her chest pain occurs mostly when she is laying down. It feels like a pressure over the sternum. This gets better when she sits up. No other associated symptoms with the chest pressure. She does not exercise and has difficulty walking because of her achilles tendon. It is difficult to say if there is an exertional component to the chest pain.   Current Medications (verified): 1)  Trazodone Hcl 100 Mg  Tabs (Trazodone Hcl) .... Take 1 Tab Qhs 2)  Zocor 80 Mg  Tabs (Simvastatin) .... Take 1 By Mouth Qd 3)  Diclofenac Sodium 75 Mg  Tbec (Diclofenac Sodium) .... Take 1 By Mouth Two Times A Day 4)  Potassium Chloride Cr 8 Meq  Cpcr (Potassium Chloride) .... 2 Tabs Qd 5)  Glucose Test Strips, Any Brand, and Lancets .... Qd 6)  Ventolin Hfa 108 (90 Base) Mcg/act  Aers (Albuterol Sulfate) .... 2 Puffs Up To Four Times Daily As Needed For Wheezing 7)  Glucophage Xr 500 Mg  Xr24h-Tab (Metformin Hcl) .... Take 2 By Mouth Two  Times A Day 8)  Citalopram Hydrobromide 40 Mg Tabs (Citalopram Hydrobromide) .... Qd 9)  Nasonex 50 Mcg/act  Susp (Mometasone Furoate) .... Two Puffs Each Nostril Daily 10)  Metoprolol Succinate 25 Mg Xr24h-Tab (Metoprolol Succinate) .... 1/2 Qd 11)  Clotrimazole-Betamethasone 1-0.05 % Crea (Clotrimazole-Betamethasone) .... Three Times A Day As Needed Foot Cracking 12)  Tylenol With Codeine #3 300-30 Mg Tabs (Acetaminophen-Codeine) .Marland Kitchen.. 1 Q4h As Needed Pain 13)  Onetouch Ultra Test  Strp (Glucose Blood) .... Once Daily, and Lancets 250.00 14)  Levothyroxine Sodium 175 Mcg Tabs (Levothyroxine Sodium) .Marland Kitchen.. 1 Qd 15)  Losartan Potassium-Hctz 100-12.5 Mg Tabs (Losartan Potassium-Hctz) .... Take One Tablet By Mouth Once Daily.  Allergies (verified): 1)  Erythromycin  Past History:  Past Medical History: Reviewed history from 05/20/2009 and no changes required. 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 Mother alive with colon cancer Father deceased from MI, age 21 2 sisters alive and healthy  allergies: 2 sisters asthma: sister heart disease: father, paternal grandmother clotting disorders: grandson cancer: mother (colon)  Social History: Reviewed history from 04/18/2009 and no changes required. Patient  states former smoker. She quit in 1977. pt is married. pt has 2 children She does not use alcohol or illicit drugs. pt is a Horticulturist, commercial (studying medical coding and billing).    Review of Systems       The patient complains of chest pain and palpitations.  The patient denies fatigue, malaise, fever, weight gain/loss, vision loss, decreased hearing, hoarseness, shortness of breath, prolonged cough, wheezing, sleep apnea, coughing up blood, abdominal pain, blood in stool, nausea, vomiting, diarrhea, heartburn, incontinence, blood in urine, muscle weakness, joint pain, leg swelling, rash, skin lesions, headache, fainting, dizziness, depression, anxiety, enlarged lymph nodes, easy bruising or bleeding, and environmental allergies.    Vital Signs:  Patient profile:   55 year old female Height:      68 inches Weight:      244 pounds BMI:     37.23 Pulse rate:   76 / minute BP sitting:   136 / 88  (left arm)  Vitals Entered By: Laurance Flatten CMA (May 23, 2009 3:52 PM)  Physical Exam  General:  General: Well developed, well nourished, NAD HEENT: OP clear, mucus membranes moist SKIN: warm, dry Neuro: No focal deficits Musculoskeletal: Muscle strength 5/5 all ext Psychiatric: Mood and affect normal Neck: No JVD, no carotid bruits, no thyromegaly, no lymphadenopathy. Lungs:Clear bilaterally, no wheezes, rhonci, crackles CV: RRR no murmurs, gallops rubs Abdomen: soft, NT, ND, BS present Extremities: No edema, pulses 2+.    Nuclear Study  Procedure date:  05/11/2009  Findings:      Rest Procedure   Myocardial perfusion imaging was performed at rest 45 minutes following the intravenous administration of Myoview Technetium 67m Tetrofosmin.  Stress Procedure   The patient received IV Lexiscan 0.4 mg over 15-seconds.  Myoview injected at 30-seconds.  There were no significant changes with infusion.  Quantitative spect images were obtained after a 45 minute delay.  QPS  Raw  Data Images:  There is a breast shadow. Stress Images:  There is decreased uptake in the distal anteroseptal wall and apex. Rest Images:  There is mild decreased uptake in the apex. Subtraction (SDS):  These findings are consistent with mild  ischemia; cannot R/O shifting breast attenuation as cause of defect. Transient Ischemic Dilatation:  .97  (Normal <1.22)  Lung/Heart Ratio:  .33  (Normal <0.45)  Quantitative Gated Spect Images  QGS EDV:  98 ml QGS ESV:  35 ml QGS EF:  64 % QGS cine images:  Normal wall motion.   Overall Impression   Exercise Capacity: Lexiscan study with no exercise. BP Response: Normal blood pressure response. Clinical Symptoms: No chest pain ECG Impression: No significant ST segment change suggestive of ischemia. Overall Impression: Abnormal lexiscan nuclear study with mild ischemia in the distal anteroseptal wall and apex; this defect may be due to shifting breast attenuation.  EKG  Procedure date:  05/23/2009  Findings:      NSR, rate 76 bpm. 1st degree AV block.   Impression & Recommendations:  Problem # 1:  CHEST PAIN (ICD-786.50) Many atypical features however she does have multiple risk factors for CAD  including HTN, hyperlipidemia, DM, obesity, sedentary lifestyle and a strong family history CAD. Her stress test was abnormal and could represent apical ischemia. I have discussed proceeding to a diagnostic left heart cath to exclude CAD. Risk and benefits reviewed. She is in agreement to proceed. We will arrange this for Wednesday April  6th at 8:30am in the outpatient cath lab.  BMET, CBC and coags today.   Her updated medication list for this problem includes:    Metoprolol Succinate 25 Mg Xr24h-tab (Metoprolol succinate) .Marland Kitchen... 1/2 qd  Orders: EKG w/ Interpretation (93000) TLB-BMP (Basic Metabolic Panel-BMET) (80048-METABOL) TLB-CBC Platelet - w/Differential (85025-CBCD) TLB-PT (Protime) (85610-PTP) TLB-PTT (85730-PTTL)  Problem # 2:   PALPITATIONS (ICD-785.1) Occur several times per month and only last a few seconds. Could be related to short runs of SVT or premature beats. If these worsen or occur more frequently, will have her wear a heart monitor. For now, observe and continue the beta blocker.   Her updated medication list for this problem includes:    Metoprolol Succinate 25 Mg Xr24h-tab (Metoprolol succinate) .Marland Kitchen... 1/2 qd  Patient Instructions: 1)  Your physician recommends that you schedule a follow-up appointment in: 3 weeks 2)  Your physician recommends that you continue on your current medications as directed. Please refer to the Current Medication list given to you today. 3)  Your physician has requested that you have a cardiac catheterization.  Cardiac catheterization is used to diagnose and/or treat various heart conditions. Doctors may recommend this procedure for a number of different reasons. The most common reason is to evaluate chest pain. Chest pain can be a symptom of coronary artery disease (CAD), and cardiac catheterization can show whether plaque is narrowing or blocking your heart's arteries. This procedure is also used to evaluate the valves, as well as measure the blood flow and oxygen levels in different parts of your heart.  For further information please visit [email or web address]  Please follow instruction sheet, as given.

## 2010-03-28 NOTE — Assessment & Plan Note (Signed)
Summary: rov for osa   Copy to:  Romero Belling Primary Provider/Referring Provider:  Minus Breeding MD  CC:  Pt is here for a 1 yr f/u appt on OSA.   Pt states she is wearing her cpap machine every night.  Approx 8 hours per night. Pt states she is due for a new mask and would also like cpap machine brought in for a routine check up.  Pt also c/o nsal stuffiness and headaches.  Pt states she has sob at rest when she is "hot."  .  History of Present Illness: the pt comes in today for f/u of her osa.  She has been wearing cpap compliantly, and states that she is due for new supplies and mask.  She is sleeping well, and is satisfied with her daytime alertness, but thinks she may be not quite as rested as in the past.  She wonders if her pressure needs to be looked at again.  Her weight is stable from the last visit.  She has some nasal congestion, and I have asked her to make sure the humidity is set appropriately for her.  Current Medications (verified): 1)  Trazodone Hcl 100 Mg  Tabs (Trazodone Hcl) .... Take 1 Tab Qhs 2)  Zocor 80 Mg  Tabs (Simvastatin) .... Take 1 By Mouth Qd 3)  Diclofenac Sodium 75 Mg  Tbec (Diclofenac Sodium) .... Take 1 By Mouth Two Times A Day 4)  Potassium Chloride Cr 8 Meq  Cpcr (Potassium Chloride) .... 2 Tabs Qd 5)  Glucose Test Strips, Any Brand, and Lancets .... Qd 6)  Ventolin Hfa 108 (90 Base) Mcg/act  Aers (Albuterol Sulfate) .... 2 Puffs Up To Four Times Daily As Needed For Wheezing 7)  Glucophage Xr 500 Mg  Xr24h-Tab (Metformin Hcl) .... Take 2 By Mouth Two Times A Day 8)  Citalopram Hydrobromide 40 Mg Tabs (Citalopram Hydrobromide) .... Qd 9)  Nasonex 50 Mcg/act  Susp (Mometasone Furoate) .... Two Puffs Each Nostril Daily 10)  Metoprolol Succinate 25 Mg Xr24h-Tab (Metoprolol Succinate) .Marland Kitchen.. 1po  Once Daily 11)  Clotrimazole-Betamethasone 1-0.05 % Crea (Clotrimazole-Betamethasone) .... Three Times A Day As Needed Foot Cracking 12)  Onetouch Ultra Test  Strp  (Glucose Blood) .... Once Daily, and Lancets 250.00 13)  Losartan Potassium-Hctz 100-12.5 Mg Tabs (Losartan Potassium-Hctz) .... Take One Tablet By Mouth Once Daily. 14)  Meclizine Hcl 12.5 Mg Tabs (Meclizine Hcl) .Marland Kitchen.. 1 - 2 By Mouth Three Times A Day As Needed 15)  Levothyroxine Sodium 200 Mcg Tabs (Levothyroxine Sodium) .Marland Kitchen.. 1 Once Daily  Allergies (verified): 1)  Erythromycin  Review of Systems       The patient complains of shortness of breath at rest, headaches, and nasal congestion/difficulty breathing through nose.  The patient denies shortness of breath with activity, productive cough, non-productive cough, coughing up blood, chest pain, irregular heartbeats, acid heartburn, indigestion, loss of appetite, weight change, abdominal pain, difficulty swallowing, sore throat, tooth/dental problems, sneezing, itching, ear ache, anxiety, depression, hand/feet swelling, joint stiffness or pain, rash, change in color of mucus, and fever.    Vital Signs:  Patient profile:   55 year old female Height:      67 inches Weight:      241 pounds BMI:     37.88 O2 Sat:      98 % on Room air Temp:     98.6 degrees F oral Pulse rate:   84 / minute BP sitting:   120 / 70  (left  arm) Cuff size:   large  Vitals Entered By: Arman Filter LPN (August 09, 2009 11:55 AM)  O2 Flow:  Room air CC: Pt is here for a 1 yr f/u appt on OSA.   Pt states she is wearing her cpap machine every night.  Approx 8 hours per night. Pt states she is due for a new mask and would also like cpap machine brought in for a routine check up.  Pt also c/o nsal stuffiness and headaches.  Pt states she has sob at rest when she is "hot."   Comments Medications reviewed with patient Arman Filter LPN  August 09, 2009 11:58 AM    Physical Exam  General:  obese female in nad Nose:  no skin breakdown or pressure necrosis from cpap mask Extremities:  no edema or cyanosis Neurologic:  alert, not sleepy, moves all 4.   Impression &  Recommendations:  Problem # 1:  OBSTRUCTIVE SLEEP APNEA (ICD-327.23) the pt is doing fairly well with cpap, but is in need of new supplies and mask.  Will also re-optimize her pressure with autoset at home for 2 weeks, and let her know the appropriate pressure.  I have encouraged her to work aggressively on weight loss, and to followup with me in 12mos or sooner if having issues. Care Plan:  At this point, will arrange for the patient's machine to be changed over to auto mode for 2 weeks to optimize their pressure.  I will review the downloaded data once sent by dme, and also evaluate for compliance, leaks, and residual osa.  I will call the patient and dme to discuss the results, and have the patient's machine set appropriately.  This will serve as the pt's cpap pressure titration.  Other Orders: Est. Patient Level III (16109) DME Referral (DME)  Patient Instructions: 1)  will get dme to work with you on new supplies, check your machine, and to reoptimize pressure with auto device 2)  work on weight loss 3)  followup with me in one year.

## 2010-03-28 NOTE — Progress Notes (Signed)
  Phone Note Refill Request Message from:  Fax from Pharmacy on May 19, 2009 11:58 AM  Pharmacy states Losartan Potassium-HCTZ 100-12.5 mg is on back order. Pharmacy would like to know if they can go back to plain Losartan 100 mg and plain HCTZ 12.5mg ? Please advise  Initial call taken by: Josph Macho RMA,  May 19, 2009 11:59 AM  Follow-up for Phone Call        sent  Follow-up by: Minus Breeding MD,  May 19, 2009 12:03 PM    New/Updated Medications: LOSARTAN POTASSIUM 100 MG TABS (LOSARTAN POTASSIUM) 1 once daily HYDROCHLOROTHIAZIDE 12.5 MG TABS (HYDROCHLOROTHIAZIDE) 1 once daily Prescriptions: HYDROCHLOROTHIAZIDE 12.5 MG TABS (HYDROCHLOROTHIAZIDE) 1 once daily  #30 x 11   Entered and Authorized by:   Minus Breeding MD   Signed by:   Minus Breeding MD on 05/19/2009   Method used:   Electronically to        [facility] [address] (retail)       [phone]       [place], [place]  [address]       Ph: [phone]       Fax: [phone]   RxID:   [id number] LOSARTAN POTASSIUM 100 MG TABS (LOSARTAN POTASSIUM) 1 once daily  #30 x 11   Entered and Authorized by:   Minus Breeding MD   Signed by:   Minus Breeding MD on 05/19/2009   Method used:   Electronically to        [facility] [address] (retail)       [phone]       [place], [place]  [address]       Ph: [phone]       Fax: [phone]   RxID:   [id number]

## 2010-03-28 NOTE — Assessment & Plan Note (Signed)
Summary: PER PT 3 MTH FU---STC   Vital Signs:  Patient profile:   55 year old female Height:      67 inches Weight:      221 pounds BMI:     34.74 O2 Sat:      98 % on Room air Temp:     98.2 degrees F oral Pulse rate:   87 / minute BP sitting:   112 / 80  (left arm) Cuff size:   large  Vitals Entered ByZella Ball Ewing (Jun 28, 2009 8:05 AM)  O2 Flow:  Room air CC: 3 Month Followup/RE   Referring Provider:  Romero Belling Primary Provider:  Minus Breeding MD  CC:  3 Month Followup/RE.  History of Present Illness: pt does not take fe tab.  she has slight hematuria last week with uti--now resolved. no cbg record, but states cbg's are well-controlled. dizziness persists, but is improved.  she feels no different with increasing synthroid. bilateral otalgia persists    Current Medications (verified): 1)  Trazodone Hcl 100 Mg  Tabs (Trazodone Hcl) .... Take 1 Tab Qhs 2)  Zocor 80 Mg  Tabs (Simvastatin) .... Take 1 By Mouth Qd 3)  Diclofenac Sodium 75 Mg  Tbec (Diclofenac Sodium) .... Take 1 By Mouth Two Times A Day 4)  Potassium Chloride Cr 8 Meq  Cpcr (Potassium Chloride) .... 2 Tabs Qd 5)  Glucose Test Strips, Any Brand, and Lancets .... Qd 6)  Ventolin Hfa 108 (90 Base) Mcg/act  Aers (Albuterol Sulfate) .... 2 Puffs Up To Four Times Daily As Needed For Wheezing 7)  Glucophage Xr 500 Mg  Xr24h-Tab (Metformin Hcl) .... Take 2 By Mouth Two Times A Day 8)  Citalopram Hydrobromide 40 Mg Tabs (Citalopram Hydrobromide) .... Qd 9)  Nasonex 50 Mcg/act  Susp (Mometasone Furoate) .... Two Puffs Each Nostril Daily 10)  Metoprolol Succinate 25 Mg Xr24h-Tab (Metoprolol Succinate) .Marland Kitchen.. 1po  Once Daily 11)  Clotrimazole-Betamethasone 1-0.05 % Crea (Clotrimazole-Betamethasone) .... Three Times A Day As Needed Foot Cracking 12)  Onetouch Ultra Test  Strp (Glucose Blood) .... Once Daily, and Lancets 250.00 13)  Levothyroxine Sodium 175 Mcg Tabs (Levothyroxine Sodium) .Marland Kitchen.. 1 Qd 14)  Losartan  Potassium-Hctz 100-12.5 Mg Tabs (Losartan Potassium-Hctz) .... Take One Tablet By Mouth Once Daily. 15)  Meclizine Hcl 12.5 Mg Tabs (Meclizine Hcl) .Marland Kitchen.. 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         she has lost weight, due to her efforts  Physical Exam  General:  normal appearance.   Head:  head: no deformity eyes: no periorbital swelling, no proptosis external nose and ears are normal mouth: no lesion seen Eyes:  both tm's are slightly red Neck:  Supple without thyroid enlargement or tenderness.   Additional Exam:   Hemoglobin           [L]  11.4 g/dL  12.0-15.0   Hematocrit           [L]  32.6 %     Iron Saturation      [L]  17.3 %                      20.0-50.0 FastTSH              [H]  10.99 uIU/mL       Impression & Recommendations:  Problem # 1:  ANEMIA, IRON DEFICIENCY (ICD-280.9) needs increased rx  Problem # 2:  HEMATURIA UNSPECIFIED (ICD-599.70) uncertain etiology  Problem # 3:  UNSPECIFIED HYPOTHYROIDISM (ICD-244.9) needs increased rx  Problem # 4:  OTITIS MEDIA, LEFT (ICD-382.9) persistent  Medications Added to Medication List This Visit: 1)  Cefuroxime Axetil 250 Mg Tabs  (Cefuroxime axetil) .Marland Kitchen.. 1 tab two times a day 2)  Levothyroxine Sodium 200 Mcg Tabs (Levothyroxine sodium) .Marland Kitchen.. 1 once daily  Other Orders: TLB-Udip w/ Micro (81001-URINE) TLB-CBC Platelet - w/Differential (85025-CBCD) TLB-IBC Pnl (Iron/FE;Transferrin) (83550-IBC) TLB-TSH (Thyroid Stimulating Hormone) (84443-TSH) TLB-A1C / Hgb A1C (Glycohemoglobin) (83036-A1C) Urology Referral (Urology) Est. Patient Level IV (08657)  Patient Instructions: 1)  tests are being ordered for you today.  a few days after the test(s), please call [phone] to hear your test results. 2)  Please schedule a follow-up appointment in 6 months. 3)  cefuroxime 250 mg two times a day 4)  (update: i left message on phone-tree:  increase synthroid to 200/day.  refer urol.  take fe 2/day). Prescriptions: LEVOTHYROXINE SODIUM 200 MCG TABS (LEVOTHYROXINE SODIUM) 1 once daily  #30 x 11   Entered and Authorized by:   Minus Breeding MD   Signed by:   Minus Breeding MD on 06/28/2009   Method used:   Electronically to        [facility] [address] (retail)       [phone]       [place], [place]  [address]       Ph: [phone]       Fax: [phone]   RxID:   [id number] METOPROLOL SUCCINATE 25 MG XR24H-TAB (METOPROLOL SUCCINATE) 1po  once daily  #90 x 3   Entered and Authorized by:   Minus Breeding MD   Signed by:   Minus Breeding MD on 06/28/2009   Method used:   Print then Give to Patient   RxID:   [id number] POTASSIUM CHLORIDE CR 8 MEQ  CPCR (POTASSIUM CHLORIDE) 2 tabs qd  #180 x 3   Entered and Authorized by:   Minus Breeding MD   Signed by:   Minus Breeding MD on 06/28/2009   Method used:   Print then Give to Patient   RxID:   [id number] LOSARTAN POTASSIUM-HCTZ 100-12.5 MG TABS (LOSARTAN POTASSIUM-HCTZ) Take one tablet by mouth once daily.  #90 x 3   Entered and Authorized by:   Minus Breeding MD   Signed by:   Minus Breeding MD on 06/28/2009   Method used:    Print then Give to Patient   RxID:   [id number] METOPROLOL SUCCINATE 25 MG XR24H-TAB (METOPROLOL SUCCINATE) 1po  once daily  #90 x 3   Entered and Authorized by:   Minus Breeding MD   Signed by:   Minus Breeding MD on 06/28/2009   Method used:   Electronically to  [facility] [address] (retail)       [phone]       [place], [place]  [address]       Ph: [phone]       Fax: [phone]   RxID:   [id number] CITALOPRAM HYDROBROMIDE 40 MG TABS (CITALOPRAM HYDROBROMIDE) qd  #90 x 3   Entered and Authorized by:   Minus Breeding MD   Signed by:   Minus Breeding MD on 06/28/2009   Method used:   Electronically to        [facility] [address] (retail)       [phone]       [place], [place]  [address]       Ph: [phone]       Fax: [phone]   RxID:   [id number] CEFUROXIME AXETIL 250 MG TABS (CEFUROXIME AXETIL) 1 tab two times a day  #14 x 11   Entered and Authorized by:   Minus Breeding MD   Signed by:   Minus Breeding MD on 06/28/2009   Method used:   Electronically to        [facility] [address] (retail)       [phone]       [place], [place]  [address]       Ph: [phone]       Fax: [phone]   RxID:   [id number]   Appended Document: PER PT 3 MTH FU---STC please ask pt to come in for hemoccults (because of anemia)  Appended Document: PER PT 3 MTH FU---STC Informed pt and put hemoccult cards in cabinet.

## 2010-03-28 NOTE — Assessment & Plan Note (Signed)
Summary: post hosp needs visit 4/19 per [facility]/sae pt/sae out all wk/cd   Vital Signs:  Patient profile:   55 year old female Height:      68 inches Weight:      241.13 pounds BMI:     36.80 O2 Sat:      97 % on Room air Temp:     97.3 degrees F oral Pulse rate:   76 / minute BP sitting:   122 / 82  (left arm) Cuff size:   large  Vitals Entered ByZella Ball Ewing (June 14, 2009 10:16 AM)  O2 Flow:  Room air CC: Post Hospital/RE   Primary Care Provider:  Minus Breeding MD  CC:  Post Hospital/RE.  History of Present Illness: here post ER eval, unfort with worsening left ear pain, fever, and mild dizziness , headache and malaise; for the last 2 days;  no ST, cough and Pt denies CP, sob, doe, wheezing, orthopnea, pnd, worsening LE edema, palps, dizziness or syncope   Pt denies new neuro symptoms such as headache, facial or extremity weakness   Pt denies polydipsia, polyuria, or low sugar symptoms such as shakiness improved with eating.  Overall good compliance with meds, trying to follow low chol, DM diet, wt stable, little excercise however   Problems Prior to Update: 1)  Otitis Media, Left  (ICD-382.9) 2)  Palpitations  (ICD-785.1) 3)  Chest Pain  (ICD-786.50) 4)  Hypertension  (ICD-401.9) 5)  Hypercholesterolemia  (ICD-272.0) 6)  Dizziness  (ICD-780.4) 7)  Osteoarthritis, Lumbar Spine  (ICD-721.90) 8)  Anemia, Iron Deficiency  (ICD-280.9) 9)  Routine General Medical Exam@health  Care Facl  (ICD-V70.0) 10)  Asymptomatic Postmenopausal Status  (ICD-V49.81) 11)  Ankle Sprain, Right  (ICD-845.00) 12)  Fatty Liver Disease  (ICD-571.8) 13)  Allergic Rhinitis Cause Unspecified  (ICD-477.9) 14)  Sleep Apnea  (ICD-780.57) 15)  Asthmatic Bronchitis, Acute  (ICD-466.0) 16)  Neck Pain  (ICD-723.1) 17)  Aodm  (ICD-250.00) 18)  Hypokalemia  (ICD-276.8) 19)  Unspecified Hypothyroidism  (ICD-244.9) 20)  Asthma  (ICD-493.90)  Medications Prior to Update: 1)  Trazodone Hcl 100 Mg  Tabs  (Trazodone Hcl) .... Take 1 Tab Qhs 2)  Zocor 80 Mg  Tabs (Simvastatin) .... Take 1 By Mouth Qd 3)  Diclofenac Sodium 75 Mg  Tbec (Diclofenac Sodium) .... Take 1 By Mouth Two Times A Day 4)  Potassium Chloride Cr 8 Meq  Cpcr (Potassium Chloride) .... 2 Tabs Qd 5)  Glucose Test Strips, Any Brand, and Lancets .... Qd 6)  Ventolin Hfa 108 (90 Base) Mcg/act  Aers (Albuterol Sulfate) .... 2 Puffs Up To Four Times Daily As Needed For Wheezing 7)  Glucophage Xr 500 Mg  Xr24h-Tab (Metformin Hcl) .... Take 2 By Mouth Two Times A Day 8)  Citalopram Hydrobromide 40 Mg Tabs (Citalopram Hydrobromide) .... Qd 9)  Nasonex 50 Mcg/act  Susp (Mometasone Furoate) .... Two Puffs Each Nostril Daily 10)  Metoprolol Succinate 25 Mg Xr24h-Tab (Metoprolol Succinate) .... 1/2 Qd 11)  Clotrimazole-Betamethasone 1-0.05 % Crea (Clotrimazole-Betamethasone) .... Three Times A Day As Needed Foot Cracking 12)  Tylenol With Codeine #3 300-30 Mg Tabs (Acetaminophen-Codeine) .Marland Kitchen.. 1 Q4h As Needed Pain 13)  Onetouch Ultra Test  Strp (Glucose Blood) .... Once Daily, and Lancets 250.00 14)  Levothyroxine Sodium 175 Mcg Tabs (Levothyroxine Sodium) .Marland Kitchen.. 1 Qd 15)  Losartan Potassium-Hctz 100-12.5 Mg Tabs (Losartan Potassium-Hctz) .... Take One Tablet By Mouth Once Daily.  Current Medications (verified): 1)  Trazodone Hcl 100  Mg  Tabs (Trazodone Hcl) .... Take 1 Tab Qhs 2)  Zocor 80 Mg  Tabs (Simvastatin) .... Take 1 By Mouth Qd 3)  Diclofenac Sodium 75 Mg  Tbec (Diclofenac Sodium) .... Take 1 By Mouth Two Times A Day 4)  Potassium Chloride Cr 8 Meq  Cpcr (Potassium Chloride) .... 2 Tabs Qd 5)  Glucose Test Strips, Any Brand, and Lancets .... Qd 6)  Ventolin Hfa 108 (90 Base) Mcg/act  Aers (Albuterol Sulfate) .... 2 Puffs Up To Four Times Daily As Needed For Wheezing 7)  Glucophage Xr 500 Mg  Xr24h-Tab (Metformin Hcl) .... Take 2 By Mouth Two Times A Day 8)  Citalopram Hydrobromide 40 Mg Tabs (Citalopram Hydrobromide) .... Qd 9)   Nasonex 50 Mcg/act  Susp (Mometasone Furoate) .... Two Puffs Each Nostril Daily 10)  Metoprolol Succinate 25 Mg Xr24h-Tab (Metoprolol Succinate) .Marland Kitchen.. 1po  Once Daily 11)  Clotrimazole-Betamethasone 1-0.05 % Crea (Clotrimazole-Betamethasone) .... Three Times A Day As Needed Foot Cracking 12)  Onetouch Ultra Test  Strp (Glucose Blood) .... Once Daily, and Lancets 250.00 13)  Levothyroxine Sodium 175 Mcg Tabs (Levothyroxine Sodium) .Marland Kitchen.. 1 Qd 14)  Losartan Potassium-Hctz 100-12.5 Mg Tabs (Losartan Potassium-Hctz) .... Take One Tablet By Mouth Once Daily. 15)  Cephalexin 500 Mg Caps (Cephalexin) .Marland Kitchen.. 1 By Mouth Three Times A Day 16)  Meclizine Hcl 12.5 Mg Tabs (Meclizine Hcl) .Marland Kitchen.. 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 She quit in 1977. pt is married. pt has 2 children She does not use alcohol or illicit drugs. pt is a Horticulturist, commercial (studying medical coding and billing).    Risk Factors: Smoking Status: quit (12/29/2007)  Review of Systems       all otherwise negative per pt -    Physical Exam  General:  alert and  overweight-appearing.   Head:  normocephalic and atraumatic.   Eyes:  vision grossly intact, pupils equal, and pupils round.   Ears:  left tm severe erythema with mild bulging;  right tm ok and canals clear, sinus nontender Nose:  no external deformity and no nasal discharge.   Mouth:  no gingival abnormalities and pharynx pink and moist.   Neck:  supple and no masses.   Lungs:  normal respiratory effort and normal breath sounds.   Heart:  normal rate and regular rhythm.   Extremities:  no edema, no erythema  Neurologic:  cranial nerves II-XII intact, gait normal, and finger-to-nose normal.     Impression & Recommendations:  Problem # 1:  OTITIS MEDIA, LEFT (ICD-382.9)  Her updated medication list for this problem includes:    Diclofenac Sodium 75 Mg Tbec (Diclofenac sodium) .Marland Kitchen... Take 1 by mouth two times a day    Cephalexin 500 Mg Caps (Cephalexin) .Marland Kitchen... 1 by mouth three times a day treat as above, f/u any worsening signs or symptoms   Problem # 2:  HYPERTENSION (ICD-401.9)  Her  updated medication list for this problem includes:    Metoprolol Succinate 25 Mg Xr24h-tab (Metoprolol succinate) .Marland Kitchen... 1po  once daily    Losartan Potassium-hctz 100-12.5 Mg Tabs (Losartan potassium-hctz) .Marland Kitchen... Take one tablet by mouth once daily.  BP today: 122/82 Prior BP: 136/88 (05/23/2009)  Labs Reviewed: K+: 4.0 (05/23/2009) Creat: : 1.3 (05/23/2009)   Chol: 109 (04/15/2009)   HDL: 45.30 (04/15/2009)   LDL: 35 (04/15/2009)   TG: 145.0 (04/15/2009) stable overall by hx and exam, ok to continue meds/tx as is   Problem # 3:  AODM (ICD-250.00)  Her updated medication list for this problem includes:    Glucophage Xr 500 Mg Xr24h-tab (Metformin hcl) .Marland Kitchen... Take 2 by mouth two times a day    Losartan Potassium-hctz 100-12.5 Mg Tabs (Losartan potassium-hctz) .Marland Kitchen... Take one tablet by mouth once daily.  Labs Reviewed: Creat: 1.3 (05/23/2009)    Reviewed HgBA1c results: 6.7 (04/15/2009)  6.7  (12/06/2008) stable overall by hx and exam, ok to continue meds/tx as is   Complete Medication List: 1)  Trazodone Hcl 100 Mg Tabs (Trazodone hcl) .... Take 1 tab qhs 2)  Zocor 80 Mg Tabs (Simvastatin) .... Take 1 by mouth qd 3)  Diclofenac Sodium 75 Mg Tbec (Diclofenac sodium) .... Take 1 by mouth two times a day 4)  Potassium Chloride Cr 8 Meq Cpcr (Potassium chloride) .... 2 tabs qd 5)  Glucose Test Strips, Any Brand, and Lancets  .... Qd 6)  Ventolin Hfa 108 (90 Base) Mcg/act Aers (Albuterol sulfate) .... 2 puffs up to four times daily as needed for wheezing 7)  Glucophage Xr 500 Mg Xr24h-tab (Metformin hcl) .... Take 2 by mouth two times a day 8)  Citalopram Hydrobromide 40 Mg Tabs (Citalopram hydrobromide) .... Qd 9)  Nasonex 50 Mcg/act Susp (Mometasone furoate) .... Two puffs each nostril daily 10)  Metoprolol Succinate 25 Mg Xr24h-tab (Metoprolol succinate) .Marland Kitchen.. 1po  once daily 11)  Clotrimazole-betamethasone 1-0.05 % Crea (Clotrimazole-betamethasone) .... Three times a day as needed foot cracking 12)  Onetouch Ultra Test Strp (Glucose blood) .... Once daily, and lancets 250.00 13)  Levothyroxine Sodium 175 Mcg Tabs (Levothyroxine sodium) .Marland Kitchen.. 1 qd 14)  Losartan Potassium-hctz 100-12.5 Mg Tabs (Losartan potassium-hctz) .... Take one tablet by mouth once daily. 15)  Cephalexin 500 Mg Caps (Cephalexin) .Marland Kitchen.. 1 by mouth three times a day 16)  Meclizine Hcl 12.5 Mg Tabs (Meclizine hcl) .Marland Kitchen.. 1 - 2 by mouth three times a day as needed  Patient Instructions: 1)  Please take all new medications as prescribed 2)  Continue all previous medications as before this visit  3)  Please schedule an appointment with your primary doctor as needed: Prescriptions: MECLIZINE HCL 12.5 MG TABS (MECLIZINE HCL) 1 - 2 by mouth three times a day as needed  #60 x 1   Entered and Authorized by:   Corwin Levins MD   Signed by:   Corwin Levins MD on 06/14/2009   Method used:   Electronically to        [facility] [address] (retail)       [phone]       [place], [place]  [address]       Ph: [phone]       Fax: [phone]   RxID:   [id number] CEPHALEXIN 500 MG CAPS (CEPHALEXIN) 1 by mouth three times a day  #30 x 0   Entered and Authorized by:  Corwin Levins MD   Signed by:   Corwin Levins MD on 06/14/2009   Method used:   Electronically to        [facility] [address] (retail)       [phone]       [place], [place]  [address]       Ph: [phone]       Fax: [phone]   RxID:   [id number]

## 2010-03-28 NOTE — Progress Notes (Signed)
----   Converted from flag ---- ---- 05/13/2009 8:43 AM, Edman Circle wrote: appt 3/28 @ 4:00 with Clifton James  ---- 05/13/2009 8:07 AM, Dagoberto Reef wrote: Thanks  ---- 05/12/2009 6:17 PM, Minus Breeding MD wrote: The following orders have been entered for this patient and placed on Admin Hold:  Type:     Referral       Code:   Cardiology Description:   Cardiology Referral Order Date:   05/12/2009   Authorized By:   Minus Breeding MD Order #:   [id number] Clinical Notes: ------------------------------

## 2010-03-28 NOTE — Letter (Signed)
[facility]: [facility]   Imported By: Sherian Rein 08/05/2009 08:44:04  _____________________________________________________________________  External Attachment:    Type:   Image     Comment:   External Document

## 2010-03-28 NOTE — Progress Notes (Signed)
Summary: labs  Phone Note Call from Patient   Caller: Patient Reason for Call: Talk to Doctor, Lab or Test Results Summary of Call: Pt left msg on vm had bloodwork done on Tuesday. Not on phone-tree. Pt requesting results. Pls advise Initial call taken by: Orlan Leavens RMA,  November 10, 2009 3:39 PM  Follow-up for Phone Call        i checked phone-tree.  it is there. Follow-up by: Minus Breeding MD,  November 10, 2009 4:00 PM  Additional Follow-up for Phone Call Additional follow up Details #1::        called pt back with md response. I verify # that was given to her, and it was'nt correct #. Gave her correct  MRN # Additional Follow-up by: Orlan Leavens RMA,  November 10, 2009 4:07 PM

## 2010-03-28 NOTE — Assessment & Plan Note (Signed)
Summary: back pain/cd   Vital Signs:  Patient profile:   55 year old female Height:      67 inches (170.18 cm) Weight:      244.25 pounds (111.02 kg) BMI:     38.39 O2 Sat:      96 % on Room air Temp:     98.3 degrees F (36.83 degrees C) oral Pulse rate:   73 / minute Pulse rhythm:   regular BP sitting:   124 / 70  (left arm) Cuff size:   large  Vitals Entered By: Brenton Grills CMA Duncan Dull) (December 29, 2009 8:34 AM)  O2 Flow:  Room air CC: Pt c/o back pain/pt is no longer taking Urocrit/aj Is Patient Diabetic? No Pain Assessment Patient in pain? yes     Location: back Intensity: 8   Referring Provider:  Romero Belling Primary Provider:  Minus Breeding MD  CC:  Pt c/o back pain/pt is no longer taking Urocrit/aj.  History of Present Illness: pt states 1 week of moderate pain at the right lumbat paravertebral area.  no assoc numbness.  no recent injury.  she says codeine causes strange dreams.  she took vicodin from an old prescription, and it did not help.  she has seen dr Ethelene Hal for this in the past.    Current Medications (verified): 1)  Trazodone Hcl 100 Mg  Tabs (Trazodone Hcl) .... Take 1 Tab Qhs 2)  Zocor 80 Mg  Tabs (Simvastatin) .... Take 1 By Mouth Qd 3)  Diclofenac Sodium 75 Mg  Tbec (Diclofenac Sodium) .... Take 1 By Mouth Two Times A Day 4)  Potassium Chloride Cr 8 Meq  Cpcr (Potassium Chloride) .... 2 Tabs Qd 5)  Ventolin Hfa 108 (90 Base) Mcg/act  Aers (Albuterol Sulfate) .... 2 Puffs Up To Four Times Daily As Needed For Wheezing 6)  Glucophage Xr 500 Mg  Xr24h-Tab (Metformin Hcl) .... Take 2 By Mouth Two Times A Day 7)  Citalopram Hydrobromide 40 Mg Tabs (Citalopram Hydrobromide) .... Qd 8)  Nasonex 50 Mcg/act  Susp (Mometasone Furoate) .... Two Puffs Each Nostril Daily 9)  Metoprolol Succinate 25 Mg Xr24h-Tab (Metoprolol Succinate) .Marland Kitchen.. 1po  Once Daily 10)  Clotrimazole-Betamethasone 1-0.05 % Crea (Clotrimazole-Betamethasone) .... Three Times A Day As Needed  Foot Cracking 11)  Losartan Potassium-Hctz 100-12.5 Mg Tabs (Losartan Potassium-Hctz) .... Take One Tablet By Mouth Once Daily. 12)  Meclizine Hcl 12.5 Mg Tabs (Meclizine Hcl) .Marland Kitchen.. 1 - 2 By Mouth Three Times A Day As Needed 13)  Levothyroxine Sodium 200 Mcg Tabs (Levothyroxine Sodium) .Marland Kitchen.. 1 Once Daily 14)  Urocit-K 15 15 Meq (1620 Mg) Cr-Tabs (Potassium Citrate) .... 2 Tablets Two Times A Day 15)  Onetouch Test  Strp (Glucose Blood) .... Once Daily, and Lancets 250.00 16)  Onglyza 5 Mg Tabs (Saxagliptin Hcl) .Marland Kitchen.. 1 Tab Each Am 17)  Amaryl 1 Mg Tabs (Glimepiride) .Marland Kitchen.. 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  denies leg weakness.  denies hypoglycemia  Physical Exam  General:  obese.  Msk:  there is no spinal tenderness. gait is normal and steady Extremities:  sensation is intact to touch on the legs Additional Exam:   Sed Rate                  21 mm/hr      Impression & Recommendations:  Problem # 1:  OSTEOARTHRITIS, LUMBAR SPINE (ICD-721.90) Assessment Deteriorated he is referred back to [facility] for f/u, but pt says she will for herself  Medications Added to Medication List This Visit: 1)  Oxycodone Hcl 10 Mg Tabs  (Oxycodone hcl) .Marland Kitchen.. 1 tab every 4 hrs as needed for pain  Other Orders: TLB-Sedimentation Rate (ESR) (85652-ESR) Est. Patient Level IV ([address])  Patient Instructions: 1)  please see dr Ethelene Hal for follow-up. 2)  blood tests are being ordered for you today.  please call [phone] to hear your test results. 3)  oxycodone 10 mg every 4 hrs as needed for pain. Prescriptions: OXYCODONE HCL 10 MG TABS (OXYCODONE HCL) 1 tab every 4 hrs as needed for pain  #50 x 0   Entered and Authorized by:   Minus Breeding MD   Signed by:   Minus Breeding MD on 12/29/2009   Method used:   Print then Give to Patient   RxID:   [id number]    Orders Added: 1)  TLB-Sedimentation Rate (ESR) [85652-ESR] 2)  Est. Patient Level IV [86578]

## 2010-03-28 NOTE — Assessment & Plan Note (Signed)
Summary: f3w/jss    Visit Type:  3 weeks follow up Referring Provider:  Romero Belling Primary Provider:  Minus Breeding MD  CC:  Pt. denies any more episodes of chest pain.  History of Present Illness: 55 yo white female with history of HTN, hyperlipidemia and DM who is here today for cardiac follow up. She was seen as a new patient for further evaluation of chest pain three weeks ago. She had recently undergone a Lexiscan stress myoview that demonstrated possible ischemia in the distal anteroseptal wall and apex.  She also described chest pain. I arranged a left heart cath to exclude obstructive CAD. This was performed on 06/01/09 and showed no evidence of CAD.  Her blood pressure was elevated in the cath lab and her LVEDP was 32. I started Toprol. She has had no further chest pain. She did have a recent inner ear infection with vertigo but this has resolved.   Current Medications (verified): 1)  Trazodone Hcl 100 Mg  Tabs (Trazodone Hcl) .... Take 1 Tab Qhs 2)  Zocor 80 Mg  Tabs (Simvastatin) .... Take 1 By Mouth Qd 3)  Diclofenac Sodium 75 Mg  Tbec (Diclofenac Sodium) .... Take 1 By Mouth Two Times A Day 4)  Potassium Chloride Cr 8 Meq  Cpcr (Potassium Chloride) .... 2 Tabs Qd 5)  Glucose Test Strips, Any Brand, and Lancets .... Qd 6)  Ventolin Hfa 108 (90 Base) Mcg/act  Aers (Albuterol Sulfate) .... 2 Puffs Up To Four Times Daily As Needed For Wheezing 7)  Glucophage Xr 500 Mg  Xr24h-Tab (Metformin Hcl) .... Take 2 By Mouth Two Times A Day 8)  Citalopram Hydrobromide 40 Mg Tabs (Citalopram Hydrobromide) .... Qd 9)  Nasonex 50 Mcg/act  Susp (Mometasone Furoate) .... Two Puffs Each Nostril Daily 10)  Metoprolol Succinate 25 Mg Xr24h-Tab (Metoprolol Succinate) .Marland Kitchen.. 1po  Once Daily 11)  Clotrimazole-Betamethasone 1-0.05 % Crea (Clotrimazole-Betamethasone) .... Three Times A Day As Needed Foot Cracking 12)  Onetouch Ultra Test  Strp (Glucose Blood) .... Once Daily, and Lancets 250.00 13)   Losartan Potassium-Hctz 100-12.5 Mg Tabs (Losartan Potassium-Hctz) .... Take One Tablet By Mouth Once Daily. 14)  Meclizine Hcl 12.5 Mg Tabs (Meclizine Hcl) .Marland Kitchen.. 1 - 2 By Mouth Three Times A Day As Needed 15)  Cefuroxime Axetil 250 Mg Tabs (Cefuroxime Axetil) .Marland Kitchen.. 1 Tab Two Times A Day 16)  Levothyroxine Sodium 200 Mcg Tabs (Levothyroxine Sodium) .Marland Kitchen.. 1 Once Daily  Allergies: 1)  Erythromycin  Past History:  Past Medical History: Reviewed history from 05/20/2009 and no changes required. 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 Patient states former smoker. She quit in 1977. pt is married. pt has 2 children She does not use alcohol or illicit drugs. pt is a Horticulturist, commercial (studying medical coding and billing).    Review of Systems  The patient denies fatigue, malaise, fever, weight gain/loss, vision loss, decreased hearing, hoarseness, chest pain, palpitations, shortness of breath, prolonged cough, wheezing, sleep apnea, coughing up blood, abdominal pain, blood in stool, nausea, vomiting, diarrhea, heartburn, incontinence, blood in urine, muscle weakness, joint pain, leg swelling, rash,  skin lesions, headache, fainting, dizziness, depression, anxiety, enlarged lymph nodes, easy bruising or bleeding, and environmental allergies.    Vital Signs:  Patient  profile:   55 year old female Height:      67 inches Weight:      245.75 pounds BMI:     38.63 Pulse rate:   83 / minute Pulse rhythm:   regular Resp:     18 per minute BP sitting:   134 / 74  (right arm) Cuff size:   large  Vitals Entered By: Vikki Ports (Jul 19, 2009 11:37 AM)  Physical Exam  General:  General: Well developed, well nourished, NAD Psychiatric: Mood and affect normal Neck: No JVD, no carotid bruits Lungs:Clear bilaterally, no wheezes, rhonci, crackles CV: RRR no murmurs, gallops rubs Abdomen: soft, NT, ND, BS present Extremities: No edema, pulses 2+. Right groin cath site without hematoma.    Cardiac Cath  Procedure date:  06/01/2009  Findings:      1. Left main coronary artery was a long vessel that had no disease.     This bifurcated into the circumflex and the left anterior     descending artery. 2. The circumflex vessel was a small to moderate-sized vessel that was     composed largely of a bifurcating moderate sized obtuse marginal     branch.  The AV groove portion of the circumflex was small in     caliber.  There was a ramus intermediate branch that was small to     moderate size and had no disease. 3. Left anterior descending is a large vessel that courses to the apex     and gives off two diagonal branches.  The first diagonal branch is     moderate-sized and has no disease.  Second diagonal branch is small     in caliber.  There is a very small-caliber third diagonal branch.     There is no disease noted in the left anterior descending system. 4. The right coronary artery is a large dominant vessel that has no     evidence of obstructive disease. 5. Left ventricular angiogram was performed in the RAO projection and     showed normal left ventricular systolic function with ejection     fraction of 60-65%.  No wall motion abnormalities.    Impression & Recommendations:  Problem # 1:  CHEST PAIN (ICD-786.50) No recurrence. No CAD on  cath. No further cardiac workup.   Her updated medication list for this problem includes:    Metoprolol Succinate 25 Mg Xr24h-tab (Metoprolol succinate) .Marland Kitchen... 1po  once daily  Problem # 2:  HYPERTENSION (ICD-401.9) Much better controlled on current therapy. She will continue to have her BP followed by Dr. Everardo All.   Her updated medication list for this problem includes:    Metoprolol Succinate 25 Mg Xr24h-tab (Metoprolol succinate) .Marland Kitchen... 1po  once daily    Losartan Potassium-hctz 100-12.5 Mg Tabs (Losartan potassium-hctz) .Marland Kitchen... Take one tablet by mouth once daily.  Patient Instructions: 1)  Your physician recommends that you schedule a follow-up appointment in: as needed.

## 2010-03-28 NOTE — Progress Notes (Signed)
Summary: CBG/SAE pt  Phone Note Call from Patient Call back at [facility] Phone [phone] Call back at Work Phone [phone]   Caller: Patient   Summary of Call: Pt called saying her CBG's are 255 and 258.  Would like her meds adjusted.  Follow-up for Phone Call        already at max glucophage and onglyza dose - will add low dose amaryl - 1 by mouth two times a day, hold if sugar<100 - erx done - will need to make OV to f/u on same with SAE in next 4 weeks, sooner if problems - thanks Follow-up by: Newt Lukes MD,  December 02, 2009 12:00 PM  Additional Follow-up for Phone Call Additional follow up Details #1::        Pt states that her normal average is 160-140. Pt advised to take if above 160 and to follow up with SAE. Is this okay? Additional Follow-up by: Margaret Pyle, CMA,  December 02, 2009 12:58 PM    Additional Follow-up for Phone Call Additional follow up Details #2::    yes - thanks Newt Lukes MD  December 02, 2009 1:01 PM   New/Updated Medications: AMARYL 1 MG TABS (GLIMEPIRIDE) 1 by mouth two times a day, hold if cbg<100 Prescriptions: AMARYL 1 MG TABS (GLIMEPIRIDE) 1 by mouth two times a day, hold if cbg<100  #60 x 0   Entered and Authorized by:   Newt Lukes MD   Signed by:   Newt Lukes MD on 12/02/2009   Method used:   Electronically to        [facility] [address] (retail)       [phone]       [place], [place]  [address]       Ph: [phone]       Fax: [phone]   RxID:   [id number]

## 2010-03-28 NOTE — Progress Notes (Signed)
----   Converted from flag ---- ---- 04/18/2009 9:27 AM, Minus Breeding MD wrote: paper chart please ? egd ? myoview ------------------------------  Phone Note Other Incoming   Summary of Call: Paperchart requested. Initial call taken by: Josph Macho RMA,  April 18, 2009 9:40 AM  Follow-up for Phone Call        Nothing for an EGD Myoview 08/30/04 Paperchart on MD's desk Follow-up by: Josph Macho RMA,  April 19, 2009 10:12 AM  Additional Follow-up for Phone Call Additional follow up Details #1::        i called pt 04/20/09. pt says she does not take fe tabs i scheduled myoview Additional Follow-up by: Minus Breeding MD,  April 20, 2009 6:29 PM

## 2010-03-28 NOTE — Cardiovascular Report (Signed)
Summary: Pre Cath Orders  Pre Cath Orders   Imported By: Roderic Ovens 05/31/2009 11:44:37  _____________________________________________________________________  External Attachment:    Type:   Image     Comment:   External Document

## 2010-03-28 NOTE — Progress Notes (Signed)
Summary: REFILL   Phone Note Refill Request Message from:  Pharmacy  Refills Requested: Medication #1:  DICLOFENAC SODIUM 75 MG  TBEC TAKE 1 by mouth two times a day OK to refill?   Initial call taken by: Lamar Sprinkles, CMA,  Jun 27, 2009 8:26 AM  Follow-up for Phone Call        refill prn Follow-up by: Minus Breeding MD,  Jun 27, 2009 9:02 AM    Prescriptions: DICLOFENAC SODIUM 75 MG  TBEC (DICLOFENAC SODIUM) TAKE 1 by mouth two times a day  #180 Each x 3   Entered by:   Margaret Pyle, CMA   Authorized by:   Minus Breeding MD   Signed by:   Margaret Pyle, CMA on 06/27/2009   Method used:   Electronically to        [facility] [address] (retail)       [phone]       [place], [place]  [address]       Ph: [phone]       Fax: [phone]   RxID:   [id number]

## 2010-03-28 NOTE — Assessment & Plan Note (Signed)
Summary: Cardiology Nuclear Study  Nuclear Med Background Indications for Stress Test: Evaluation for Ischemia   History: Myocardial Perfusion Study  History Comments: 07/06 MPS NL EF 69%  Symptoms: Chest Tightness    Nuclear Pre-Procedure Cardiac Risk Factors: Family History - CAD, History of Smoking, NIDDM Caffeine/Decaff Intake: None NPO After: 6:00 AM Lungs: clear IV 0.9% NS with Angio Cath: 22g     IV Site: (L) AC IV Started by: Irean Hong RN Chest Size (in) 44     Cup Size D     Height (in): 68 Weight (lb): 239 BMI: 36.47 Tech Comments: The patient has foot pain, and took metoprolol this am., changed to lexiscan.Patsy Edwards,RN.  Nuclear Med Study 1 or 2 day study:  1 day     Stress Test Type:  Eugenie Birks Reading MD:  Olga Millers, MD     Referring MD:  S.Ellison Resting Radionuclide:  Technetium 79m Tetrofosmin     Resting Radionuclide Dose:  11.0 mCi  Stress Radionuclide:  Technetium 89m Tetrofosmin     Stress Radionuclide Dose:  33.0 mCi   Stress Protocol   Lexiscan: 0.4 mg   Stress Test Technologist:  Milana Na EMT-P     Nuclear Technologist:  Domenic Polite CNMT  Rest Procedure  Myocardial perfusion imaging was performed at rest 45 minutes following the intravenous administration of Myoview Technetium 101m Tetrofosmin.  Stress Procedure  The patient received IV Lexiscan 0.4 mg over 15-seconds.  Myoview injected at 30-seconds.  There were no significant changes with infusion.  Quantitative spect images were obtained after a 45 minute delay.  QPS Raw Data Images:  There is a breast shadow. Stress Images:  There is decreased uptake in the distal anteroseptal wall and apex. Rest Images:  There is mild decreased uptake in the apex. Subtraction (SDS):  These findings are consistent with mild  ischemia; cannot R/O shifting breast attenuation as cause of defect. Transient Ischemic Dilatation:  .97  (Normal <1.22)  Lung/Heart Ratio:  .33  (Normal  <0.45)  Quantitative Gated Spect Images QGS EDV:  98 ml QGS ESV:  35 ml QGS EF:  64 % QGS cine images:  Normal wall motion.   Overall Impression  Exercise Capacity: Lexiscan study with no exercise. BP Response: Normal blood pressure response. Clinical Symptoms: No chest pain ECG Impression: No significant ST segment change suggestive of ischemia. Overall Impression: Abnormal lexiscan nuclear study with mild ischemia in the distal anteroseptal wall and apex; this defect may be due to shifting breast attenuation.  Appended Document: Orders Update  i called pt 05/12/09 ref cardiol   Clinical Lists Changes  Orders: Added new Referral order of Cardiology Referral (Cardiology) - Signed

## 2010-03-28 NOTE — Progress Notes (Signed)
----   Converted from flag ---- ---- 04/18/2009 11:44 AM, Minus Breeding MD wrote: no, we'll takwe the dip only  ---- 04/18/2009 10:25 AM, Josph Macho RMA wrote: Lab called and said pt had enough urine for tests except microscopic testing/ lab stated that if you wanted urine to be looked at under Microscope pt would need to come back? ------------------------------

## 2010-03-28 NOTE — Assessment & Plan Note (Signed)
Summary: elev sugar/cd   Vital Signs:  Patient profile:   55 year old female Height:      67 inches (170.18 cm) Weight:      240.50 pounds (109.32 kg) BMI:     37.80 O2 Sat:      97 % on Room air Temp:     98.0 degrees F (36.67 degrees C) oral Pulse rate:   74 / minute BP sitting:   122 / 60  (left arm) Cuff size:   large  Vitals Entered By: Brenton Grills MA (November 08, 2009 8:54 AM)  O2 Flow:  Room air CC: elevated blood sugar/pt needs test strips and lancets for Freedom meter/aj Is Patient Diabetic? Yes   Referring Provider:  Romero Belling Primary Provider:  Minus Breeding MD  CC:  elevated blood sugar/pt needs test strips and lancets for Freedom meter/aj.  History of Present Illness: the status of at least 3 ongoing medical problems is addressed today: dm:  no cbg record, but states cbg's have increased to 200.  she reports slight leg edema. anemia:  she has intermittent mild brbpr.  she does not take fe tabs hypothyroidism:  she has dry skin.  Current Medications (verified): 1)  Trazodone Hcl 100 Mg  Tabs (Trazodone Hcl) .... Take 1 Tab Qhs 2)  Zocor 80 Mg  Tabs (Simvastatin) .... Take 1 By Mouth Qd 3)  Diclofenac Sodium 75 Mg  Tbec (Diclofenac Sodium) .... Take 1 By Mouth Two Times A Day 4)  Potassium Chloride Cr 8 Meq  Cpcr (Potassium Chloride) .... 2 Tabs Qd 5)  Glucose Test Strips, Any Brand, and Lancets .... Qd 6)  Ventolin Hfa 108 (90 Base) Mcg/act  Aers (Albuterol Sulfate) .... 2 Puffs Up To Four Times Daily As Needed For Wheezing 7)  Glucophage Xr 500 Mg  Xr24h-Tab (Metformin Hcl) .... Take 2 By Mouth Two Times A Day 8)  Citalopram Hydrobromide 40 Mg Tabs (Citalopram Hydrobromide) .... Qd 9)  Nasonex 50 Mcg/act  Susp (Mometasone Furoate) .... Two Puffs Each Nostril Daily 10)  Metoprolol Succinate 25 Mg Xr24h-Tab (Metoprolol Succinate) .Marland Kitchen.. 1po  Once Daily 11)  Clotrimazole-Betamethasone 1-0.05 % Crea (Clotrimazole-Betamethasone) .... Three Times A Day As  Needed Foot Cracking 12)  Onetouch Ultra Test  Strp (Glucose Blood) .... Once Daily, and Lancets 250.00 13)  Losartan Potassium-Hctz 100-12.5 Mg Tabs (Losartan Potassium-Hctz) .... Take One Tablet By Mouth Once Daily. 14)  Meclizine Hcl 12.5 Mg Tabs (Meclizine Hcl) .Marland Kitchen.. 1 - 2 By Mouth Three Times A Day As Needed 15)  Levothyroxine Sodium 200 Mcg Tabs (Levothyroxine Sodium) .Marland Kitchen.. 1 Once Daily 16)  Urocit-K 15 15 Meq (1620 Mg) Cr-Tabs (Potassium Citrate) .... 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         denies n/v  Physical Exam  General:  normal appearance.   Pulses:  dorsalis pedis intact bilat.  Extremities:  there are contraction deformities at the right foot.  no ulcer on  the feet.  feet are of normal color and temp.   trace right pedal edema and trace left pedal edema.   Neurologic:  cn 2-12 grossly intact.   readily moves all 4's.   sensation is intact to touch on the feet, but significantly decreased from normal. Additional Exam:  Hemoglobin                12.1 g/dL                   16.1-09.6 Hematocrit           [L]  34.9 %    Hemoglobin A1C       [H]  7.5 %   Impression & Recommendations:  Problem # 1:  ANEMIA, IRON  DEFICIENCY (ICD-280.9) Assessment Improved  Problem # 2:  edema this precludes actos, at least for now  Problem # 3:  AODM (ICD-250.00) needs increased rx  Problem # 4:  UNSPECIFIED HYPOTHYROIDISM (ICD-244.9) well-replaced  Medications Added to Medication List This Visit: 1)  Urocit-k 15 15 Meq (1620 Mg) Cr-tabs (Potassium citrate) .... 2 tablets two times a day 2)  Onetouch Test Strp (Glucose blood) .... Once daily, and lancets 250.00 3)  Onglyza 5 Mg Tabs (Saxagliptin hcl) .Marland Kitchen.. 1 tab each am  Other Orders: TLB-CBC Platelet - w/Differential (85025-CBCD) TLB-TSH (Thyroid Stimulating Hormone) (84443-TSH) TLB-IBC Pnl (Iron/FE;Transferrin) (83550-IBC) TLB-A1C / Hgb A1C (Glycohemoglobin) (83036-A1C) Est. Patient Level IV (04540)  Patient Instructions: 1)  blood tests are being ordered for you today.  please call [phone] to hear your test results. 2)  pending the test results, please add onglyza 5 mg each am 3)  good diet and exercise habits significanly improve the control of your diabetes.  please let me know if you wish to be referred to a dietician.  high blood sugar is very risky to your health.  you should see an eye doctor every year. 4)  Please schedule a follow-up appointment in 4 months. 5)  (update: i left message on phone-tree:  rx as we discussed) Prescriptions: ONGLYZA 5 MG TABS (SAXAGLIPTIN HCL) 1 tab each am  #30 x 11   Entered and Authorized by:   Minus Breeding MD   Signed by:   Minus Breeding MD on 11/08/2009   Method used:   Electronically to        [facility] [phone]* (retail)       [address] [place]  [address]       Ph: [phone] or [phone]       Fax: [phone]   RxID:   [id number] [facility] TEST  STRP (GLUCOSE BLOOD) once daily, and lancets 250.00  #100 x 3   Entered and Authorized by:   Minus Breeding MD   Signed by:   Minus Breeding MD on 11/08/2009   Method used:   Electronically to        [facility] [phone]*  (retail)       [address]       [place], [place]  [address]       Ph: [phone] or [phone]       Fax: [phone]   RxID:   [id number]

## 2010-03-28 NOTE — Letter (Signed)
[facility]: [facility]   Imported By: Sherian Rein 08/18/2009 15:14:08  _____________________________________________________________________  External Attachment:    Type:   Image     Comment:   External Document

## 2010-03-28 NOTE — Progress Notes (Signed)
  Phone Note Refill Request Message from:  Fax from Pharmacy on March 02, 2009 8:48 AM  Refills Requested: Medication #1:  VENTOLIN HFA 108 (90 BASE) MCG/ACT  AERS 2 puffs up to four times daily as needed for wheezing   Dosage confirmed as above?Dosage Confirmed [facility] supply  Initial call taken by: Josph Macho CMA,  March 02, 2009 8:48 AM    Prescriptions: VENTOLIN HFA 108 (90 BASE) MCG/ACT  AERS (ALBUTEROL SULFATE) 2 puffs up to four times daily as needed for wheezing  #3 x 3   Entered by:   Josph Macho CMA   Authorized by:   Minus Breeding MD   Signed by:   Josph Macho CMA on 03/02/2009   Method used:   [facility] to        [facility] [phone]* (retail)       [address]       [place], [place]  [address]       Ph: [phone] or [phone]       Fax: [phone]   RxID:   [id number]

## 2010-03-28 NOTE — Assessment & Plan Note (Signed)
Summary: CPX/NWS #/cd   Vital Signs:  Patient profile:   55 year old female Height:      68 inches (172.72 cm) Weight:      239.50 pounds (108.86 kg) O2 Sat:      96 % on Room air Temp:     97.1 degrees F (36.17 degrees C) oral Pulse rate:   97 / minute BP sitting:   108 / 72  (left arm) Cuff size:   large  Vitals Entered By: Josph Macho RMA (April 18, 2009 9:07 AM)  O2 Flow:  Room air CC: Physical/Pt states she has been having chest tightness/ pt states she is no longer taking Singulair, Doxycycline, or Benzonatate/CF Is Patient Diabetic? Yes   Referring Provider:  Romero Belling Primary Provider:  Everardo All  CC:  Physical/Pt states she has been having chest tightness/ pt states she is no longer taking Singulair, Doxycycline, and or Benzonatate/CF.  History of Present Illness: here for regular wellness examination.  He's feeling pretty well in general, and does not drink or smoke.   Current Medications (verified): 1)  Hyzaar 100-25 Mg  Tabs (Losartan Potassium-Hctz) .... Take 1 By Mouth Qd 2)  Trazodone Hcl 100 Mg  Tabs (Trazodone Hcl) .... Take 1/2 Tab Qhs 3)  Zocor 80 Mg  Tabs (Simvastatin) .... Take 1 By Mouth Qd 4)  Diclofenac Sodium 75 Mg  Tbec (Diclofenac Sodium) .... Take 1 By Mouth Two Times A Day 5)  Potassium Chloride Cr 8 Meq  Cpcr (Potassium Chloride) .... 2 Tabs Qd 6)  Glucose Test Strips, Any Brand, and Lancets .... Qd 7)  Ventolin Hfa 108 (90 Base) Mcg/act  Aers (Albuterol Sulfate) .... 2 Puffs Up To Four Times Daily As Needed For Wheezing 8)  Glucophage Xr 500 Mg  Xr24h-Tab (Metformin Hcl) .... Take 2 By Mouth Two Times A Day 9)  Citalopram Hydrobromide 40 Mg Tabs (Citalopram Hydrobromide) .... Qd 10)  Nasonex 50 Mcg/act  Susp (Mometasone Furoate) .... Two Puffs Each Nostril Daily 11)  Singulair 10 Mg  Tabs (Montelukast Sodium) .... One By Mouth Daily 12)  Metoprolol Succinate 25 Mg Xr24h-Tab (Metoprolol Succinate) .... 1/2 Qd 13)   Clotrimazole-Betamethasone 1-0.05 % Crea (Clotrimazole-Betamethasone) .... Three Times A Day As Needed Foot Cracking 14)  Levothyroxine Sodium 150 Mcg Tabs (Levothyroxine Sodium) .Marland Kitchen.. 1 Qd 15)  Tylenol With Codeine #3 300-30 Mg Tabs (Acetaminophen-Codeine) .Marland Kitchen.. 1 Q4h As Needed Pain 16)  Doxycycline Hyclate 100 Mg Caps (Doxycycline Hyclate) .Marland Kitchen.. 1 Bid 17)  Benzonatate 100 Mg Caps (Benzonatate) .Marland Kitchen.. 1 Three Times A Day As Needed Cough 18)  Onetouch Ultra Test  Strp (Glucose Blood) .... Once Daily, and Lancets 250.00  Allergies (verified): 1)  Erythromycin  Family History: Reviewed history from 12/29/2007 and no changes required. allergies: 2 sisters asthma: sister heart disease: father, paternal grandmother clotting disorders: grandson cancer: mother (colon)   Social History: Reviewed history from 12/29/2007 and no changes required. Patient states former smoker.  pt is married. pt has children. pt is a Horticulturist, commercial.    Review of Systems  The patient denies fever, weight loss, vision loss, decreased hearing, chest pain, dyspnea on exertion, prolonged cough, abdominal pain, melena, severe indigestion/heartburn, and suspicious skin lesions.         she atttributes chest pain and headache to anxiety.  she attributes depression to her dtr's probs.  Physical Exam  General:  normal appearance.   Head:  head: no deformity eyes: no periorbital swelling, no proptosis external nose  and ears are normal mouth: no lesion seen Neck:  Supple without thyroid enlargement or tenderness.   Breasts:  sees gyn  Heart:  Regular rate and rhythm without murmurs or gallops noted. Normal S1,S2.   Abdomen:  abdomen is soft, nontender.  no hepatosplenomegaly.   not distended.  there is a self-reducing ventral hernia Rectal:  sees gyn  Genitalia:  sees gyn  Msk:  muscle bulk and strength are grossly normal.  no obvious joint swelling.  gait is normal and steady  Pulses:  dorsalis pedis  intact bilat.  no carotid bruit Extremities:  there are contraction deformities at the right foot.  no ulcer on the feet.  feet are of normal color and temp.  no edeme Neurologic:  cn 2-12 grossly intact.   readily moves all 4's.   sensation is intact to touch on the feet, but significantly decreased from normal. Skin:  normal texture and temp.  no rash.  not diaphoretic  Cervical Nodes:  No significant adenopathy.  Psych:  Alert and cooperative; normal mood and affect; normal attention span and concentration.   Additional Exam:  SEPARATE EVALUATION FOLLOWS--EACH PROBLEM HERE IS NEW, NOT RESPONDING TO TREATMENT, OR POSES SIGNIFICANT RISK TO THE PATIENT'S HEALTH: HISTORY OF THE PRESENT ILLNESS: she says trazodone does not relieve her insomnia low plts are noted. she denies brbpr pt states last week had tightness in the chest in the context of anxiety PAST MEDICAL HISTORY reviewed and up to date today REVIEW OF SYSTEMS: denies syncope and weight gain PHYSICAL EXAMINATION: chest wall is nontender clear to auscultation.  no respiratory distress see vs page LAB/XRAY RESULTS: ecg is reviewed Platelet Count       [L]  128.0 K/uL    FastTSH              [H]  13.74 uIU/mL   IMPRESSION: htn, overcontrolled insomnia, needs increased rx chest pain, uncertain etiology low plts hypothyroidism, needs increased rx PLAN: see instruction page   Impression & Recommendations:  Problem # 1:  ROUTINE GENERAL MEDICAL EXAM@HEALTH  CARE FACL (ICD-V70.0)  Medications Added to Medication List This Visit: 1)  Trazodone Hcl 100 Mg Tabs (Trazodone hcl) .... Take 1 tab qhs 2)  Losartan Potassium-hctz 100-12.5 Mg Tabs (Losartan potassium-hctz) .Marland Kitchen.. 1 qd 3)  Levothyroxine Sodium 175 Mcg Tabs (Levothyroxine sodium) .Marland Kitchen.. 1 qd  Other Orders: EKG w/ Interpretation ([address]) TLB-Lipid Panel (80061-LIPID) TLB-BMP (Basic Metabolic Panel-BMET) (80048-METABOL) TLB-CBC Platelet - w/Differential  (85025-CBCD) TLB-Hepatic/Liver Function Pnl (80076-HEPATIC) TLB-TSH (Thyroid Stimulating Hormone) (84443-TSH) TLB-IBC Pnl (Iron/FE;Transferrin) (83550-IBC) TLB-A1C / Hgb A1C (Glycohemoglobin) (83036-A1C) TLB-Microalbumin/Creat Ratio, Urine (82043-MALB) TLB-Udip w/ Micro (81001-URINE) Cardiology Referral (Cardiology) Est. Patient Level IV ([address]) Est. Patient 40-64 years ([address])  Preventive Care Screening     gyn is dr Senaida Ores   Patient Instructions: 1)  increase trazodone to 100 mg at bedtime. 2)  skip 1 day of hyzaar, then restart at 100/12.5, 1 day. 3)  tests are being ordered for you today.  a few days after the test(s), please call [phone] to hear your test results. 4)  pending the test results, please continue the same other medications for now. 5)  Please schedule a follow-up appointment in 3 months. 6)  (update: i left message on phone-tree:  increase levothyroxine to 175 micrograms/d.  we'll follow plts) Prescriptions: LEVOTHYROXINE SODIUM 175 MCG TABS (LEVOTHYROXINE SODIUM) 1 qd  #30 x 11   Entered and Authorized by:   Minus Breeding MD   Signed by:  Minus Breeding MD on 04/19/2009   Method used:   [facility]        [facility] [phone]* (retail)       [address], [place]  [address]       Ph: [phone] or [phone]       Fax: [phone]   RxID:   [id number] LOSARTAN POTASSIUM-HCTZ 100-12.5 MG TABS (LOSARTAN POTASSIUM-HCTZ) 1 qd  #30 x 11   Entered and Authorized by:   Minus Breeding MD   Signed by:   Minus Breeding MD on 04/18/2009   Method used:   [facility] to        [facility] [phone]* (retail)       [address] [place]  [address]       Ph: [phone] or [phone]       Fax: [phone]   RxID:   [id number] LOSARTAN POTASSIUM-HCTZ 100-12.5 MG TABS (LOSARTAN POTASSIUM-HCTZ) 1 qd  #30 x 11   Entered and Authorized by:   Minus Breeding MD   Signed by:   Minus Breeding MD on 04/18/2009    Method used:   Electronically to        [facility] [address] (retail)       [phone]       [place], [place]  [address]       Ph: [phone]       Fax: [phone]   RxID:   [id number] TRAZODONE HCL 100 MG  TABS (TRAZODONE HCL) take 1 tab qhs  #30 x 11   Entered and Authorized by:   Minus Breeding MD   Signed by:   Minus Breeding MD on 04/18/2009   Method used:   Electronically to        [facility] [address] (retail)       [phone]       [place], [place]  [address]       Ph: [phone]       Fax: [phone]   RxID:   [id number]

## 2010-03-28 NOTE — Progress Notes (Signed)
Summary: Nuclear Pre-Procedure  Phone Note Outgoing Call   Call placed by: Milana Na, EMT-P,  May 10, 2009 2:02 PM Summary of Call: Left message with information on Myoview Information Sheet (see scanned document for details).      Nuclear Med Background Indications for Stress Test: Evaluation for Ischemia   History: Myocardial Perfusion Study  History Comments: 07/06 MPS NL EF 69%  Symptoms: Chest Tightness    Nuclear Pre-Procedure Cardiac Risk Factors: Family History - CAD, History of Smoking, NIDDM Height (in): 68  Nuclear Med Study Referring MD:  S.Ellison

## 2010-03-28 NOTE — Letter (Signed)
Summary: Cardiac Catheterization Instructions- [facility]  [facility], Main Office  [address]   [place], [place] [address]   Phone: [phone]  Fax: [phone]     05/23/2009 MRN: [id number]  Savannah [name] [address] [place], [place]  [address]  Dear Savannah [name],   You are scheduled for a Cardiac Catheterization on _April 6, 2011 with Dr. Clifton James.  Please arrive to the 1st floor of the Heart and Vascular Center at [facility] at 7:30 am  on the day of your procedure. Please do not arrive before 6:30 a.m. Call the Heart and Vascular Center at [phone] if you are unable to make your appointmnet. The Code to get into the parking garage under the building is 0001.. Take the elevators to the 1st floor. You must have someone to drive you home. Someone must be with you for the first 24 hours after you arrive home. Please wear clothes that are easy to get on and off and wear slip-on shoes. Do not eat or drink after midnight except water with your medications that morning. Bring all your medications and current insurance cards with you.  _X__ DO NOT take these medications before your procedure: Do not take glucophage evening before and morning of procedure and do not take for 48 hours after procedure ________________________________________________________________  ___ Make sure you take your aspirin.  __X_ You may take ALL of your medications with water that morning. ________________________________________________________________________________________________________________________________  ___ DO NOT take ANY medications before your procedure.  ___ Pre-med instructions:  ________________________________________________________________________________________________________________________________  The usual length of stay after your procedure is 2 to 3 hours. This can vary.  If you have any questions, please call the office at the number  listed above.   Dossie Arbour, RN, BSN

## 2010-03-28 NOTE — Assessment & Plan Note (Signed)
Summary: FLU SHOT Natale Milch  Nurse Visit   Allergies: 1)  Erythromycin  Orders Added: 1)  Admin 1st Vaccine [90471] 2)  Flu Vaccine 40yrs + [[address]]      Flu Vaccine Consent Questions     Do you have a history of severe allergic reactions to this vaccine? no    Any prior history of allergic reactions to egg and/or gelatin? no    Do you have a sensitivity to the preservative Thimersol? no    Do you have a past history of Guillan-Barre Syndrome? no    Do you currently have an acute febrile illness? no    Have you ever had a severe reaction to latex? no    Vaccine information given and explained to patient? yes    Are you currently pregnant? no    Lot Number:[id number]   Exp Date:08/26/2010   Site Given  Left Deltoid IM

## 2010-03-28 NOTE — Progress Notes (Signed)
----   Converted from flag ---- ---- 05/02/2009 1:28 PM, Edman Circle wrote: appt 3/16 @ 8:15 NPO 4 hours, no smoking or caffeine 12 hours  ---- 05/02/2009 11:56 AM, Dagoberto Reef wrote: Savannah [name], pt need treadmill myoview.   Thanks ------------------------------

## 2010-03-30 NOTE — Assessment & Plan Note (Signed)
Summary: 6 MTH FU  STC   Vital Signs:  Patient profile:   55 year old female Height:      67 inches (170.18 cm) Weight:      237.25 pounds (107.84 kg) BMI:     37.29 O2 Sat:      96 % on Room air Temp:     98.8 degrees F (37.11 degrees C) oral Pulse rate:   85 / minute Pulse rhythm:   regular BP sitting:   122 / 80  (left arm) Cuff size:   large  Vitals Entered By: Brenton Grills CMA Duncan Dull) (February 07, 2010 9:56 AM)  O2 Flow:  Room air CC: Follow-up visit/discuss Onglyza/aj Is Patient Diabetic? Yes   Referring Provider:  Romero Belling Primary Provider:  Minus Breeding MD  CC:  Follow-up visit/discuss Onglyza/aj.  History of Present Illness: pt states she feels well in general.  she seldom takes the amaryl.    Current Medications (verified): 1)  Trazodone Hcl 100 Mg  Tabs (Trazodone Hcl) .... Take 1 Tab Qhs 2)  Zocor 80 Mg  Tabs (Simvastatin) .... Take 1 By Mouth Qd 3)  Diclofenac Sodium 75 Mg  Tbec (Diclofenac Sodium) .... Take 1 By Mouth Two Times A Day 4)  Potassium Chloride Cr 8 Meq  Cpcr (Potassium Chloride) .... 2 Tabs Qd 5)  Ventolin Hfa 108 (90 Base) Mcg/act  Aers (Albuterol Sulfate) .... 2 Puffs Up To Four Times Daily As Needed For Wheezing 6)  Glucophage Xr 500 Mg  Xr24h-Tab (Metformin Hcl) .... Take 2 By Mouth Two Times A Day 7)  Citalopram Hydrobromide 40 Mg Tabs (Citalopram Hydrobromide) .... Qd 8)  Nasonex 50 Mcg/act  Susp (Mometasone Furoate) .... Two Puffs Each Nostril Daily 9)  Metoprolol Succinate 25 Mg Xr24h-Tab (Metoprolol Succinate) .Marland Kitchen.. 1po  Once Daily 10)  Clotrimazole-Betamethasone 1-0.05 % Crea (Clotrimazole-Betamethasone) .... Three Times A Day As Needed Foot Cracking 11)  Losartan Potassium-Hctz 100-12.5 Mg Tabs (Losartan Potassium-Hctz) .... Take One Tablet By Mouth Once Daily. 12)  Meclizine Hcl 12.5 Mg Tabs (Meclizine Hcl) .Marland Kitchen.. 1 - 2 By Mouth Three Times A Day As Needed 13)  Levothyroxine Sodium 200 Mcg Tabs (Levothyroxine Sodium) .Marland Kitchen.. 1 Once  Daily 14)  Urocit-K 15 15 Meq (1620 Mg) Cr-Tabs (Potassium Citrate) .... 2 Tablets Two Times A Day 15)  Onetouch Test  Strp (Glucose Blood) .... Once Daily, and Lancets 250.00 16)  Onglyza 5 Mg Tabs (Saxagliptin Hcl) .Marland Kitchen.. 1 Tab Each Am 17)  Amaryl 1 Mg Tabs (Glimepiride) .Marland Kitchen.. 1 By Mouth Two Times A Day, Hold If Cbg<100 18)  Oxycodone Hcl 10 Mg Tabs (Oxycodone Hcl) .Marland Kitchen.. 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    Physical Exam  General:  normal appearance.   Psych:  Alert and cooperative; normal mood and affect; normal attention span and concentration.   Additional Exam:  Hemoglobin A1C            6.2 %  Impression & Recommendations:  Problem # 1:  AODM (ICD-250.00) well-controlled  Other Orders: TLB-A1C / Hgb A1C (Glycohemoglobin) (83036-A1C)  Patient Instructions: 1)  blood tests are being ordered for you today.  please call [phone] to hear your test results. 2)  Please schedule a physical appointment in 3 months. 3)  (update: i left message on phone-tree:  rx as we discussed.  you do not need the amaryl). Prescriptions: ONGLYZA 5 MG TABS (SAXAGLIPTIN HCL) 1 tab each am  #90 x 3   Entered and Authorized by:   Minus Breeding MD   Signed by:   Minus Breeding MD on 02/07/2010   Method used:   Electronically to        [facility] [address] (retail)       [phone]       [place], [place]  [address]       Ph: [phone]       Fax: [phone]   RxID:   [id number]    Orders Added: 1)  TLB-A1C / Hgb A1C (Glycohemoglobin) [83036-A1C]     Preventive Care Screening  Pap Smear:    Date:  09/26/2009    Results:  normal   Mammogram:    Date:  05/19/2009    Results:  normal

## 2010-03-30 NOTE — Assessment & Plan Note (Signed)
Summary: blood in urine/cd   Vital Signs:  Patient profile:   55 year old female Height:      67 inches (170.18 cm) Weight:      244.50 pounds (111.14 kg) BMI:     38.43 O2 Sat:      97 % on Room air Temp:     97.4 degrees F (36.33 degrees C) oral Pulse rate:   81 / minute Pulse rhythm:   regular BP sitting:   120 / 70  (left arm) Cuff size:   large  Vitals Entered By: Brenton Grills CMA Duncan Dull) (March 07, 2010 3:35 PM)  O2 Flow:  Room air CC: Blood in urine, frequency, pressure in lower abdomen/dx with Kidney stone in ER 02/20/10/aj Is Patient Diabetic? Yes   Referring Provider:  Romero Belling Primary Provider:  Minus Breeding MD  CC:  Blood in urine, frequency, and pressure in lower abdomen/dx with Kidney stone in ER 02/20/10/aj.  History of Present Illness: pt was seen in er 2 weeks ago with painful hematuria.  ct showed only the pre-existing right renal stone.  she was rx'ed with abx and pain meds.  sxs are little if any improved since then.    Current Medications (verified): 1)  Trazodone Hcl 100 Mg  Tabs (Trazodone Hcl) .... Take 1 Tab Qhs 2)  Zocor 80 Mg  Tabs (Simvastatin) .... Take 1 By Mouth Qd 3)  Diclofenac Sodium 75 Mg  Tbec (Diclofenac Sodium) .... Take 1 By Mouth Two Times A Day 4)  Potassium Chloride Cr 8 Meq  Cpcr (Potassium Chloride) .... 2 Tabs Qd 5)  Ventolin Hfa 108 (90 Base) Mcg/act  Aers (Albuterol Sulfate) .... 2 Puffs Up To Four Times Daily As Needed For Wheezing 6)  Glucophage Xr 500 Mg  Xr24h-Tab (Metformin Hcl) .... Take 2 By Mouth Two Times A Day 7)  Citalopram Hydrobromide 40 Mg Tabs (Citalopram Hydrobromide) .... Qd 8)  Nasonex 50 Mcg/act  Susp (Mometasone Furoate) .... Two Puffs Each Nostril Daily 9)  Metoprolol Succinate 25 Mg Xr24h-Tab (Metoprolol Succinate) .Marland Kitchen.. 1po  Once Daily 10)  Clotrimazole-Betamethasone 1-0.05 % Crea (Clotrimazole-Betamethasone) .... Three Times A Day As Needed Foot Cracking 11)  Losartan Potassium-Hctz 100-12.5 Mg  Tabs (Losartan Potassium-Hctz) .... Take One Tablet By Mouth Once Daily. 12)  Meclizine Hcl 12.5 Mg Tabs (Meclizine Hcl) .Marland Kitchen.. 1 - 2 By Mouth Three Times A Day As Needed 13)  Levothyroxine Sodium 200 Mcg Tabs (Levothyroxine Sodium) .Marland Kitchen.. 1 Once Daily 14)  Urocit-K 15 15 Meq (1620 Mg) Cr-Tabs (Potassium Citrate) .... 2 Tablets Two Times A Day 15)  Onetouch Test  Strp (Glucose Blood) .... Once Daily, and Lancets 250.00 16)  Onglyza 5 Mg Tabs (Saxagliptin Hcl) .Marland Kitchen.. 1 Tab Each Am 17)  Oxycodone Hcl 10 Mg Tabs (Oxycodone Hcl) .Marland Kitchen.. 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    Physical Exam  General:  obese.  no distress  Abdomen:  abdomen is soft, nontender.  no hepatosplenomegaly.  not distended.  no hernia.    Impression & Recommendations:  Problem # 1:  URINARY CALCULUS (ICD-592.9) with recurrent sxs  Other Orders: T-Urine Culture (Spectrum Order) [id number]) Urology Referral (Urology) Est. Patient Level III ([address])  Patient Instructions: 1)  refer back to dr Patsi Sears.  you will be called with a day and time for an appointment.   2)  continue to take oxycodone as needed for pain.     Orders Added: 1)  T-Urine Culture (Spectrum Order)  [[id number]] 2)  Urology Referral [Urology] 3)  Est. Patient Level III [99213]    Laboratory Results   Urine Tests   Date/Time Reported: 03/07/2010 3:36pm  Routine Urinalysis   Color: brown Appearance: Hazy Glucose: negative   (Normal Range: Negative) Bilirubin: moderate   (Normal Range: Negative) Ketone: moderate (40)   (Normal Range: Negative) Spec. Gravity: >=1.030   (Normal Range: 1.003-1.035) Blood: moderate   (Normal Range: Negative) pH: 5.0   (Normal Range: 5.0-8.0) Protein: 100   (Normal Range: Negative) Urobilinogen: 0.2   (Normal Range: 0-1) Nitrite: negative   (Normal Range: Negative) Leukocyte Esterace: negative   (Normal Range: Negative)

## 2010-03-30 NOTE — Letter (Signed)
Summary: [facility]   Imported By: Lester Monticello 03/17/2010 10:45:57  _____________________________________________________________________  External Attachment:    Type:   Image     Comment:   External Document

## 2010-03-30 NOTE — Progress Notes (Signed)
Summary: rx refill req  Phone Note Refill Request Message from:  Fax from Pharmacy on March 14, 2010 1:52 PM  Refills Requested: Medication #1:  TRAZODONE HCL 100 MG  TABS take 1 tab qhs   Dosage confirmed as above?Dosage Confirmed   Last Refilled: 12/20/2009  Method Requested: Electronic Next Appointment Scheduled: 05/09/2010 Initial call taken by: Brenton Grills CMA (AAMA),  March 14, 2010 1:52 PM    Prescriptions: TRAZODONE HCL 100 MG  TABS (TRAZODONE HCL) take 1 tab qhs  #30 x 1   Entered by:   Brenton Grills CMA (AAMA)   Authorized by:   Minus Breeding MD   Signed by:   Brenton Grills CMA (AAMA) on 03/14/2010   Method used:   Electronically to        [facility] [address] (retail)       [phone]       [place], [place]  [address]       Ph: [phone]       Fax: [phone]   RxID:   [id number]

## 2010-04-06 ENCOUNTER — Ambulatory Visit (HOSPITAL_COMMUNITY)
Admission: RE | Admit: 2010-04-06 | Discharge: 2010-04-06 | Disposition: A | Discharge status: Home / Self Care | Payer: 59 | Source: Ambulatory Visit | Attending: Surgery | Admitting: Surgery

## 2010-04-06 DIAGNOSIS — E0590 Thyrotoxicosis, unspecified without thyrotoxic crisis or storm: Secondary | ICD-10-CM

## 2010-04-06 MED ORDER — TECHNETIUM TC 99M SESTAMIBI - CARDIOLITE
25.0000 | Freq: Once | INTRAVENOUS | Status: AC | PRN
  Administered 2010-04-06: 09:00:00 25 via INTRAVENOUS

## 2010-04-06 MED ORDER — TECHNETIUM TC 99M SESTAMIBI - CARDIOLITE: 25.0000 | Freq: Once | INTRAVENOUS | Status: DC | PRN

## 2010-04-19 NOTE — Consult Note (Signed)
Summary: [facility]   Imported By: Sherian Rein 04/11/2010 10:21:16  _____________________________________________________________________  External Attachment:    Type:   Image     Comment:   External Document

## 2010-04-25 ENCOUNTER — Encounter: Payer: Self-pay | Admitting: Endocrinology

## 2010-05-02 ENCOUNTER — Other Ambulatory Visit: Payer: Self-pay

## 2010-05-03 ENCOUNTER — Encounter: Payer: Self-pay | Admitting: Endocrinology

## 2010-05-08 LAB — URINALYSIS, ROUTINE W REFLEX MICROSCOPIC
Ketones, ur: NEGATIVE mg/dL
Nitrite: NEGATIVE
Protein, ur: NEGATIVE mg/dL
pH: 6 (ref 5.0–8.0)

## 2010-05-08 LAB — COMPREHENSIVE METABOLIC PANEL
ALT: 51 U/L — ABNORMAL HIGH (ref 0–35)
BUN: 17 mg/dL (ref 6–23)
CO2: 24 mEq/L (ref 19–32)
Calcium: 8.9 mg/dL (ref 8.4–10.5)
Creatinine, Ser: 1.08 mg/dL (ref 0.4–1.2)
GFR calc non Af Amer: 53 mL/min — ABNORMAL LOW (ref >60)
Glucose, Bld: 210 mg/dL — ABNORMAL HIGH (ref 70–99)

## 2010-05-08 LAB — CBC
HCT: 34.7 % — ABNORMAL LOW (ref 36.0–46.0)
Hemoglobin: 11.4 g/dL — ABNORMAL LOW (ref 12.0–15.0)
MCH: 29.5 pg (ref 26.0–34.0)
MCHC: 32.9 g/dL (ref 30.0–36.0)
MCV: 89.9 fL (ref 78.0–100.0)
RDW: 13.8 % (ref 11.5–15.5)

## 2010-05-08 LAB — DIFFERENTIAL
Eosinophils Absolute: 0.2 10*3/uL (ref 0.0–0.7)
Lymphocytes Relative: 32 % (ref 12–46)
Lymphs Abs: 1.6 10*3/uL (ref 0.7–4.0)
Neutro Abs: 2.8 10*3/uL (ref 1.7–7.7)
Neutrophils Relative %: 57 % (ref 43–77)

## 2010-05-08 LAB — URINE MICROSCOPIC-ADD ON

## 2010-05-09 ENCOUNTER — Encounter: Payer: Self-pay | Admitting: Endocrinology

## 2010-05-09 ENCOUNTER — Other Ambulatory Visit: Payer: Self-pay | Admitting: Endocrinology

## 2010-05-09 ENCOUNTER — Other Ambulatory Visit: Payer: 59

## 2010-05-09 ENCOUNTER — Encounter (INDEPENDENT_AMBULATORY_CARE_PROVIDER_SITE_OTHER): Payer: 59 | Admitting: Endocrinology

## 2010-05-09 DIAGNOSIS — I10 Essential (primary) hypertension: Secondary | ICD-10-CM

## 2010-05-09 DIAGNOSIS — E7800 Pure hypercholesterolemia, unspecified: Secondary | ICD-10-CM

## 2010-05-09 DIAGNOSIS — E039 Hypothyroidism, unspecified: Secondary | ICD-10-CM

## 2010-05-09 DIAGNOSIS — K7689 Other specified diseases of liver: Secondary | ICD-10-CM

## 2010-05-09 DIAGNOSIS — Z780 Asymptomatic menopausal state: Secondary | ICD-10-CM

## 2010-05-09 DIAGNOSIS — E119 Type 2 diabetes mellitus without complications: Secondary | ICD-10-CM

## 2010-05-09 DIAGNOSIS — M1480 Arthropathies in other specified diseases classified elsewhere, unspecified site: Secondary | ICD-10-CM

## 2010-05-09 DIAGNOSIS — Z0000 Encounter for general adult medical examination without abnormal findings: Secondary | ICD-10-CM

## 2010-05-09 DIAGNOSIS — Z23 Encounter for immunization: Secondary | ICD-10-CM

## 2010-05-09 DIAGNOSIS — D509 Iron deficiency anemia, unspecified: Secondary | ICD-10-CM

## 2010-05-09 LAB — HEMOGLOBIN A1C: Hgb A1c MFr Bld: 7.1 % — ABNORMAL HIGH (ref 4.6–6.5)

## 2010-05-09 LAB — CBC WITH DIFFERENTIAL/PLATELET
Eosinophils Absolute: 0.2 10*3/uL (ref 0.0–0.7)
Eosinophils Relative: 3.1 % (ref 0.0–5.0)
MCHC: 35.2 g/dL (ref 30.0–36.0)
MCV: 87.7 fl (ref 78.0–100.0)
Monocytes Absolute: 0.4 10*3/uL (ref 0.1–1.0)
Neutrophils Relative %: 63.8 % (ref 43.0–77.0)
Platelets: 166 10*3/uL (ref 150.0–400.0)
WBC: 6.1 10*3/uL (ref 4.5–10.5)

## 2010-05-09 LAB — HEPATIC FUNCTION PANEL
Bilirubin, Direct: 0.2 mg/dL (ref 0.0–0.3)
Total Bilirubin: 1.1 mg/dL (ref 0.3–1.2)

## 2010-05-09 LAB — IBC PANEL
Iron: 70 ug/dL (ref 42–145)
Transferrin: 330.8 mg/dL (ref 212.0–360.0)

## 2010-05-09 LAB — BASIC METABOLIC PANEL
BUN: 19 mg/dL (ref 6–23)
Calcium: 9.1 mg/dL (ref 8.4–10.5)
Chloride: 106 mEq/L (ref 96–112)
Creatinine, Ser: 1.1 mg/dL (ref 0.4–1.2)

## 2010-05-09 LAB — URINALYSIS, ROUTINE W REFLEX MICROSCOPIC
Nitrite: NEGATIVE
Total Protein, Urine: NEGATIVE
Urine Glucose: NEGATIVE
Urobilinogen, UA: 0.2 (ref 0.0–1.0)

## 2010-05-09 LAB — LIPID PANEL
Cholesterol: 113 mg/dL (ref 0–200)
HDL: 34.2 mg/dL — ABNORMAL LOW (ref >39.00)
LDL Cholesterol: 45 mg/dL (ref 0–99)
Triglycerides: 168 mg/dL — ABNORMAL HIGH (ref 0.0–149.0)

## 2010-05-09 LAB — MICROALBUMIN / CREATININE URINE RATIO: Microalb, Ur: 1.3 mg/dL (ref 0.0–1.9)

## 2010-05-09 NOTE — Letter (Signed)
Summary: Dr. Magnus Ivan visit note  Dr. Magnus Ivan visit note   Imported By: Kassie Mends 05/05/2010 09:35:09  _____________________________________________________________________  External Attachment:    Type:   Image     Comment:   External Document

## 2010-05-11 ENCOUNTER — Telehealth: Payer: Self-pay | Admitting: Endocrinology

## 2010-05-15 ENCOUNTER — Ambulatory Visit (INDEPENDENT_AMBULATORY_CARE_PROVIDER_SITE_OTHER)
Admission: RE | Admit: 2010-05-15 | Discharge: 2010-05-15 | Disposition: A | Discharge status: Home / Self Care | Payer: 59 | Source: Ambulatory Visit

## 2010-05-15 DIAGNOSIS — Z780 Asymptomatic menopausal state: Secondary | ICD-10-CM

## 2010-05-15 DIAGNOSIS — Z13820 Encounter for screening for osteoporosis: Secondary | ICD-10-CM

## 2010-05-16 NOTE — Letter (Signed)
[facility]: [facility]   Imported By: Sherian Rein 05/10/2010 10:17:35  _____________________________________________________________________  External Attachment:    Type:   Image     Comment:   External Document

## 2010-05-16 NOTE — Progress Notes (Signed)
Summary: Rx req  Phone Note Call from Patient Call back at Home Phone [phone]   Caller: Patient Summary of Call: Pt called stating she recieved message left on PT and would like to start Actos as advised. Pt is requesting Rx to [facility] Initial call taken by: Margaret Pyle, CMA,  May 11, 2010 9:46 AM  Follow-up for Phone Call        i sent rx Follow-up by: Minus Breeding MD,  May 11, 2010 11:58 AM  Additional Follow-up for Phone Call Additional follow up Details #1::        Pt informed Additional Follow-up by: Margaret Pyle, CMA,  May 11, 2010 12:00 PM    New/Updated Medications: ACTOS 15 MG TABS (PIOGLITAZONE HCL) 1 tab once daily Prescriptions: ACTOS 15 MG TABS (PIOGLITAZONE HCL) 1 tab once daily  #30 x 11   Entered and Authorized by:   Minus Breeding MD   Signed by:   Minus Breeding MD on 05/11/2010   Method used:   Electronically to        [facility]* (retail)       [address]       [place], [place]  [address]       Ph: [phone]       Fax: [phone]   RxID:   [id number]

## 2010-05-17 LAB — URINALYSIS, ROUTINE W REFLEX MICROSCOPIC
Glucose, UA: NEGATIVE mg/dL
Hgb urine dipstick: NEGATIVE
Ketones, ur: NEGATIVE mg/dL
Protein, ur: NEGATIVE mg/dL
pH: 5 (ref 5.0–8.0)

## 2010-05-17 LAB — BASIC METABOLIC PANEL
BUN: 20 mg/dL (ref 6–23)
CO2: 24 mEq/L (ref 19–32)
Chloride: 107 mEq/L (ref 96–112)
GFR calc Af Amer: >60 mL/min (ref >60)
Potassium: 4 mEq/L (ref 3.5–5.1)

## 2010-05-17 LAB — CBC
HCT: 32.3 % — ABNORMAL LOW (ref 36.0–46.0)
MCHC: 34.3 g/dL (ref 30.0–36.0)
MCV: 90.3 fL (ref 78.0–100.0)
RBC: 3.58 MIL/uL — ABNORMAL LOW (ref 3.87–5.11)
WBC: 4.9 10*3/uL (ref 4.0–10.5)

## 2010-05-17 LAB — POCT I-STAT GLUCOSE
Glucose, Bld: 151 mg/dL — ABNORMAL HIGH (ref 70–99)
Operator id: 221371

## 2010-05-17 LAB — DIFFERENTIAL
Basophils Relative: 0 % (ref 0–1)
Eosinophils Absolute: 0.2 10*3/uL (ref 0.0–0.7)
Eosinophils Relative: 4 % (ref 0–5)
Lymphs Abs: 1.3 10*3/uL (ref 0.7–4.0)
Monocytes Relative: 6 % (ref 3–12)

## 2010-05-23 ENCOUNTER — Other Ambulatory Visit: Payer: Self-pay | Admitting: Endocrinology

## 2010-05-25 ENCOUNTER — Encounter: Payer: Self-pay | Admitting: Endocrinology

## 2010-05-25 NOTE — Assessment & Plan Note (Signed)
Summary: PHYSICAL  / STC / NWS   Vital Signs:  Patient profile:   55 year old female Height:      67 inches (170.18 cm) Weight:      238.25 pounds (108.30 kg) BMI:     37.45 O2 Sat:      96 % on Room air Temp:     99.1 degrees F (37.28 degrees C) oral Pulse rate:   83 / minute Pulse rhythm:   regular BP sitting:   124 / 82  (left arm) Cuff size:   large  Vitals Entered By: Brenton Grills CMA Duncan Dull) (May 09, 2010 9:35 AM)  O2 Flow:  Room air CC: CPX/aj Is Patient Diabetic? Yes   Referring Provider:  Romero Belling Primary Provider:  Minus Breeding MD  CC:  CPX/aj.  History of Present Illness: here for regular wellness examination.  He's feeling pretty well in general, and does not drink or smoke.  Current Medications (verified): 1)  Trazodone Hcl 100 Mg  Tabs (Trazodone Hcl) .... Take 1 Tab Qhs 2)  Zocor 80 Mg  Tabs (Simvastatin) .... Take 1 By Mouth Qd 3)  Diclofenac Sodium 75 Mg  Tbec (Diclofenac Sodium) .... Take 1 By Mouth Two Times A Day 4)  Potassium Chloride Cr 8 Meq  Cpcr (Potassium Chloride) .... 2 Tabs Qd 5)  Ventolin Hfa 108 (90 Base) Mcg/act  Aers (Albuterol Sulfate) .... 2 Puffs Up To Four Times Daily As Needed For Wheezing 6)  Glucophage Xr 500 Mg  Xr24h-Tab (Metformin Hcl) .... Take 2 By Mouth Two Times A Day 7)  Citalopram Hydrobromide 40 Mg Tabs (Citalopram Hydrobromide) .... Qd 8)  Nasonex 50 Mcg/act  Susp (Mometasone Furoate) .... Two Puffs Each Nostril Daily 9)  Metoprolol Succinate 25 Mg Xr24h-Tab (Metoprolol Succinate) .Marland Kitchen.. 1po  Once Daily 10)  Clotrimazole-Betamethasone 1-0.05 % Crea (Clotrimazole-Betamethasone) .... Three Times A Day As Needed Foot Cracking 11)  Losartan Potassium-Hctz 100-12.5 Mg Tabs (Losartan Potassium-Hctz) .... Take One Tablet By Mouth Once Daily. 12)  Meclizine Hcl 12.5 Mg Tabs (Meclizine Hcl) .Marland Kitchen.. 1 - 2 By Mouth Three Times A Day As Needed 13)  Levothyroxine Sodium 200 Mcg Tabs (Levothyroxine Sodium) .Marland Kitchen.. 1 Once Daily 14)   Urocit-K 15 15 Meq (1620 Mg) Cr-Tabs (Potassium Citrate) .... 2 Tablets Two Times A Day 15)  Onetouch Test  Strp (Glucose Blood) .... Once Daily, and Lancets 250.00 16)  Onglyza 5 Mg Tabs (Saxagliptin Hcl) .Marland Kitchen.. 1 Tab Each Am 17)  Oxycodone Hcl 10 Mg Tabs (Oxycodone Hcl) .Marland Kitchen.. 1 Tab Every 4 Hrs As Needed For Pain  Allergies (verified): 1)  Erythromycin  Past History:  Past Surgical History: Arthroscopic knee surgery 1993 gyn is dr Ambrose Mantle  Family History: Reviewed history from 05/23/2009 and no changes required. Mother alive with colon cancer Father deceased from MI, age 57 2 sisters alive and healthy  allergies: 2 sisters asthma: sister heart disease: father, paternal grandmother clotting disorders: grandson cancer: mother (colon)   Social History: Reviewed history from 05/23/2009 and no changes required. Patient states former smoker. She quit in 1977. pt is married. pt has 2 children She does not use alcohol or illicit drugs. 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    Review of Systems  The patient denies weight gain, vision loss, decreased hearing, chest pain, syncope, dyspnea on exertion, headaches, abdominal pain, melena, hematochezia, severe indigestion/heartburn, suspicious skin lesions, and depression.         she has a  slight dry cough--residual from recent uri.  she recently saw urol for heamaturia.  Physical Exam  General:  obese.  no distress  Head:  head: no deformity eyes: no periorbital swelling, no proptosis external nose and ears are normal mouth: no lesion seen Neck:  Supple without thyroid enlargement or tenderness.  Breasts:  sees gyn  Heart:  Regular rate and rhythm without murmurs or gallops noted. Normal S1,S2.   Abdomen:  abdomen is soft, nontender.  no hepatosplenomegaly.   not distended.  no hernia  Rectal:  sees gyn  Genitalia:  sees gyn  Msk:  muscle bulk and strength are grossly normal.  no obvious joint  swelling.  gait is normal and steady  Extremities:  no deformity.  no ulcer on the feet.  feet are of normal color and temp.  no edema the right achilles tendon is (chronically) ruptured there hammer toes and mycotic toenails.   Neurologic:  cn 2-12 grossly intact.   readily moves all 4's.   sensation is intact to touch on the feet  Skin:  normal texture and temp.  no rash.  not diaphoretic  Cervical Nodes:  No significant adenopathy.  Psych:  Alert and cooperative; normal mood and affect; normal attention span and concentration.   Additional Exam:  SEPARATE EVALUATION FOLLOWS--EACH PROBLEM HERE IS NEW, NOT RESPONDING TO TREATMENT, OR POSES SIGNIFICANT RISK TO THE PATIENT'S HEALTH: HISTORY OF THE PRESENT ILLNESS: pt states 3 weeks of intermittent slight pain at both ears.  she had assoc nasal congestion, but this is improved.   PAST MEDICAL HISTORY reviewed and up to date today REVIEW OF SYSTEMS: denies fever and weight loss PHYSICAL EXAMINATION: dorsalis pedis intact bilat.  no carotid bruit clear to auscultation.  no respiratory distress both tm's are slightly red LAB/XRAY RESULTS: anemia, fe, and a1c, and liver panel are noted IMPRESSION: dm, needs increased rx bilat aom fatty liver, needs increased rx fe-deficiency anemia, needs increased rx PLAN: see instruction page    Impression & Recommendations:  Problem # 1:  ROUTINE GENERAL MEDICAL EXAM@HEALTH  CARE FACL (ICD-V70.0)  Medications Added to Medication List This Visit: 1)  Doxycycline Hyclate 100 Mg Tabs (Doxycycline hyclate) .Marland Kitchen.. 1 tab once daily  Other Orders: EKG w/ Interpretation (93000) T-Lumbar Vertebral Assessment (04540) T-Bone Densitometry (98119) Pneumococcal Vaccine (14782) Admin 1st Vaccine (95621) TLB-Lipid Panel (80061-LIPID) TLB-BMP (Basic Metabolic Panel-BMET) (80048-METABOL) TLB-CBC Platelet - w/Differential (85025-CBCD) TLB-Hepatic/Liver Function Pnl (80076-HEPATIC) TLB-TSH (Thyroid  Stimulating Hormone) (84443-TSH) TLB-IBC Pnl (Iron/FE;Transferrin) (83550-IBC) TLB-A1C / Hgb A1C (Glycohemoglobin) (83036-A1C) TLB-Microalbumin/Creat Ratio, Urine (82043-MALB) TLB-Udip w/ Micro (81001-URINE) Est. Patient Level IV (30865) Est. Patient 40-64 years (78469)  Patient Instructions: 1)  take doxycycline 100 mg two times a day 2)  please consider these measures for your health:  minimize alcohol.  do not use tobacco products.  have a colonoscopy at least every 10 years from age 88.  keep firearms safely stored.  always use seat belts.  have working smoke alarms in your home.  see an eye doctor and dentist regularly.  never drive under the influence of alcohol or drugs (including prescription drugs).  those with fair skin should take precautions against the sun. 3)  please let me know what your wishes would be, if artificial life support measures should become necessary.  it is critically important to prevent falling down (keep floor areas well-lit, dry, and free of loose objects) 4)  blood tests are being ordered for you today.  please call [phone] to hear your test  results. 5)  Please schedule a follow-up appointment in 6 months. 6)  (pth 252.1 and a1c 250.00 prior) 7)  (update: i left message on phone-tree:  take fe 1/day.  hemoccults are being mailed.  i advised actos 15 mg once daily.   Prescriptions: OXYCODONE HCL 10 MG TABS (OXYCODONE HCL) 1 tab every 4 hrs as needed for pain  #50 x 0   Entered and Authorized by:   Minus Breeding MD   Signed by:   Minus Breeding MD on 05/09/2010   Method used:   Print then Give to Patient   RxID:   [id number] DOXYCYCLINE HYCLATE 100 MG TABS (DOXYCYCLINE HYCLATE) 1 tab once daily  #14 x 0   Entered and Authorized by:   Minus Breeding MD   Signed by:   Minus Breeding MD on 05/09/2010   Method used:   Electronically to        [facility] (retail)       [phone]       [place], [place]  [address]        Ph: [phone]       Fax: [phone]   RxID:   [id number]    Orders Added: 1)  EKG w/ Interpretation [93000] 2)  T-Lumbar Vertebral Assessment [77082] 3)  T-Bone Densitometry [77080] 4)  Pneumococcal Vaccine [90732] 5)  Admin 1st Vaccine [90471] 6)  TLB-Lipid Panel [80061-LIPID] 7)  TLB-BMP (Basic Metabolic Panel-BMET) [80048-METABOL] 8)  TLB-CBC Platelet - w/Differential [85025-CBCD] 9)  TLB-Hepatic/Liver Function Pnl [80076-HEPATIC] 10)  TLB-TSH (Thyroid Stimulating Hormone) [84443-TSH] 11)  TLB-IBC Pnl (Iron/FE;Transferrin) [83550-IBC] 12)  TLB-A1C / Hgb A1C (Glycohemoglobin) [83036-A1C] 13)  TLB-Microalbumin/Creat Ratio, Urine [82043-MALB] 14)  TLB-Udip w/ Micro [81001-URINE] 15)  Est. Patient Level IV [34742] 16)  Est. Patient 40-64 years [99396]   Immunizations Administered:  Pneumonia Vaccine:    Vaccine Type: Pneumovax    Site: right deltoid    Mfr: Merck    Dose: 0.5 ml    Route: IM    Given by: Brenton Grills CMA (AAMA)    Exp. Date: 07/21/2011    Lot #: [id number]    VIS given: 01/31/09 version given May 09, 2010.   Immunizations Administered:  Pneumonia Vaccine:    Vaccine Type: Pneumovax    Site: right deltoid    Mfr: Merck    Dose: 0.5 ml    Route: IM    Given by: Brenton Grills CMA (AAMA)    Exp. Date: 07/21/2011    Lot #: [id number]    VIS given: 01/31/09 version given May 09, 2010.

## 2010-05-29 ENCOUNTER — Emergency Department (HOSPITAL_COMMUNITY)
Admission: EM | Admit: 2010-05-29 | Discharge: 2010-05-29 | Disposition: A | Discharge status: Home / Self Care | Payer: 59 | Attending: Emergency Medicine | Admitting: Emergency Medicine

## 2010-05-29 DIAGNOSIS — Z87442 Personal history of urinary calculi: Secondary | ICD-10-CM | POA: Insufficient documentation

## 2010-05-29 DIAGNOSIS — Z79899 Other long term (current) drug therapy: Secondary | ICD-10-CM | POA: Insufficient documentation

## 2010-05-29 DIAGNOSIS — M549 Dorsalgia, unspecified: Secondary | ICD-10-CM | POA: Insufficient documentation

## 2010-05-29 DIAGNOSIS — E039 Hypothyroidism, unspecified: Secondary | ICD-10-CM | POA: Insufficient documentation

## 2010-05-29 DIAGNOSIS — I10 Essential (primary) hypertension: Secondary | ICD-10-CM | POA: Insufficient documentation

## 2010-05-29 DIAGNOSIS — M5430 Sciatica, unspecified side: Secondary | ICD-10-CM | POA: Insufficient documentation

## 2010-06-08 LAB — GLUCOSE, CAPILLARY
Glucose-Capillary: 110 mg/dL — ABNORMAL HIGH (ref 70–99)
Glucose-Capillary: 121 mg/dL — ABNORMAL HIGH (ref 70–99)

## 2010-06-15 ENCOUNTER — Other Ambulatory Visit: Payer: Self-pay | Admitting: Endocrinology

## 2010-06-25 ENCOUNTER — Other Ambulatory Visit: Payer: Self-pay | Admitting: Endocrinology

## 2010-06-26 NOTE — Telephone Encounter (Signed)
Pt requesting Rx refill on Celexa.

## 2010-06-26 NOTE — Telephone Encounter (Signed)
Please preload and refill prn

## 2010-06-28 ENCOUNTER — Other Ambulatory Visit: Payer: Self-pay | Admitting: Endocrinology

## 2010-06-28 MED ORDER — CITALOPRAM HYDROBROMIDE 40 MG PO TABS: 40.0000 mg | ORAL_TABLET | Freq: Every day | ORAL | Status: DC

## 2010-06-28 NOTE — Telephone Encounter (Signed)
R'cd fax from [facility] for refill for pt's Citalopram  Last OV-05/09/2010  Last Filled-03/25/2010

## 2010-07-07 ENCOUNTER — Other Ambulatory Visit: Payer: Self-pay | Admitting: Endocrinology

## 2010-07-13 ENCOUNTER — Other Ambulatory Visit: Payer: Self-pay | Admitting: Endocrinology

## 2010-07-14 ENCOUNTER — Other Ambulatory Visit (INDEPENDENT_AMBULATORY_CARE_PROVIDER_SITE_OTHER): Payer: 59

## 2010-07-14 ENCOUNTER — Ambulatory Visit (INDEPENDENT_AMBULATORY_CARE_PROVIDER_SITE_OTHER): Payer: 59 | Admitting: Endocrinology

## 2010-07-14 ENCOUNTER — Encounter: Payer: Self-pay | Admitting: Endocrinology

## 2010-07-14 ENCOUNTER — Other Ambulatory Visit: Payer: Self-pay | Admitting: *Deleted

## 2010-07-14 DIAGNOSIS — K7689 Other specified diseases of liver: Secondary | ICD-10-CM

## 2010-07-14 DIAGNOSIS — E876 Hypokalemia: Secondary | ICD-10-CM

## 2010-07-14 DIAGNOSIS — M6281 Muscle weakness (generalized): Secondary | ICD-10-CM

## 2010-07-14 DIAGNOSIS — E039 Hypothyroidism, unspecified: Secondary | ICD-10-CM

## 2010-07-14 DIAGNOSIS — D509 Iron deficiency anemia, unspecified: Secondary | ICD-10-CM

## 2010-07-14 DIAGNOSIS — E119 Type 2 diabetes mellitus without complications: Secondary | ICD-10-CM

## 2010-07-14 LAB — HEMOGLOBIN A1C: Hgb A1c MFr Bld: 6.3 % (ref 4.6–6.5)

## 2010-07-14 LAB — CBC WITH DIFFERENTIAL/PLATELET
Eosinophils Absolute: 0.2 10*3/uL (ref 0.0–0.7)
Lymphocytes Relative: 32.1 % (ref 12.0–46.0)
MCHC: 34.5 g/dL (ref 30.0–36.0)
MCV: 90.6 fl (ref 78.0–100.0)
Monocytes Absolute: 0.4 10*3/uL (ref 0.1–1.0)
Neutrophils Relative %: 58.5 % (ref 43.0–77.0)
Platelets: 165 10*3/uL (ref 150.0–400.0)
WBC: 6 10*3/uL (ref 4.5–10.5)

## 2010-07-14 LAB — HEPATIC FUNCTION PANEL
AST: 48 U/L — ABNORMAL HIGH (ref 0–37)
Albumin: 3.9 g/dL (ref 3.5–5.2)
Alkaline Phosphatase: 59 U/L (ref 39–117)
Bilirubin, Direct: 0.2 mg/dL (ref 0.0–0.3)
Total Protein: 6.7 g/dL (ref 6.0–8.3)

## 2010-07-14 LAB — BASIC METABOLIC PANEL
BUN: 23 mg/dL (ref 6–23)
Calcium: 9.2 mg/dL (ref 8.4–10.5)
Chloride: 105 mEq/L (ref 96–112)
Creatinine, Ser: 1.1 mg/dL (ref 0.4–1.2)

## 2010-07-14 LAB — CK: Total CK: 344 U/L — ABNORMAL HIGH (ref 7–177)

## 2010-07-14 LAB — IBC PANEL: Iron: 84 ug/dL (ref 42–145)

## 2010-07-14 MED ORDER — SIMVASTATIN 80 MG PO TABS: 80.0000 mg | ORAL_TABLET | Freq: Every day | ORAL | Status: DC

## 2010-07-14 MED ORDER — METOPROLOL SUCCINATE ER 25 MG PO TB24: 25.0000 mg | ORAL_TABLET | Freq: Every day | ORAL | Status: DC

## 2010-07-14 MED ORDER — LOSARTAN POTASSIUM-HCTZ 100-12.5 MG PO TABS: 1.0000 | ORAL_TABLET | Freq: Every day | ORAL | Status: DC

## 2010-07-14 NOTE — Procedures (Signed)
[name], [name] NO.:  [id number]   MEDICAL RECORD NO.:  [id number]          PATIENT TYPE:  OUT   LOCATION:  SLEEP CENTER                 FACILITY:  [facility]   PHYSICIAN:  Marcelyn Bruins, M.D. [facility] DATE OF BIRTH:  03/24/1955   DATE OF STUDY:  09/11/2004                              NOCTURNAL POLYSOMNOGRAM   REFERRING PHYSICIAN:  Dr. Romero Belling   INDICATION FOR THE STUDY:  Hypersomnia with sleep apnea. Epworth score: 10.   SLEEP ARCHITECTURE:  The patient had a total sleep time of 242 minutes and  never achieved slow wave sleep or REM. Sleep onset latency was prolonged 93  minutes. Sleep efficiency was very 452%.   IMPRESSION:  1.  Very severe obstructive sleep apnea/hypopnea syndrome with a respiratory      disturbance index of 119 events per hour and oxygen desaturation as low      as 75%. Events were not positional but were associated with moderate to      loud snoring. Treatment at this degree of sleep apnea will primarily      revolve around weight loss and C-PAP.  2.  Rare premature atrial contraction and premature ventricular contraction.     ______________________________  Suzzette Righter    KC/MEDQ  D:  09/20/2004 11:46:28  T:  09/20/2004 13:39:19  Job:  [id number]

## 2010-07-14 NOTE — Patient Instructions (Addendum)
blood tests are being ordered for you today.  please call [phone] to hear your test results.  You will be prompted to enter the 9-digit "MRN" number that appears at the top left of this page, followed by #.  Then you will hear the message. Let' check a special test at a neurologist's office, to try and find the cause of the symptoms. Stop simvastatin, on a trial basis (update: i left message on phone-tree:  rx as we discussed).

## 2010-07-14 NOTE — Progress Notes (Signed)
Subjective:    Patient ID: Savannah [name], female    DOB: February 03, 1956, 55 y.o.   MRN: [id number]  HPI Pt states few weeks of moderate pain at the arms and legs, worse in the past few days.  She has assoc weakness.  On further questioning, she says the sxs are throughout the body.   She takes fe 1/d She takes actos. Past Medical History  Diagnosis Date  . Unspecified hypothyroidism 01/07/2007  . 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 HYPERCHOLESTEROLEMIA 04/18/2009  . HYPOKALEMIA 01/15/2007  . ANEMIA, IRON DEFICIENCY 12/06/2008  . OBSTRUCTIVE SLEEP APNEA 08/09/2009  . HYPERTENSION 09/27/2006  . ALLERGIC RHINITIS CAUSE UNSPECIFIED 12/29/2007  . ASTHMA 09/27/2006  . FATTY LIVER DISEASE 03/19/2008  . URINARY CALCULUS 11/08/2009  . 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 BACK PAIN, LUMBAR 12/29/2009  . Palpitations 05/23/2009  . ASYMPTOMATIC POSTMENOPAUSAL STATUS 03/19/2008    Past Surgical History  Procedure Date  . Knee arthroscopy 1993    History   Social History  . Marital Status: Married    Spouse Name: N/A    Number of Children: 2  . Years of Education: N/A   Occupational History  . Homemaker    Social History Main Topics  . Smoking status: Former Smoker    Quit date: 02/27/1975  . Smokeless tobacco: Not on file  . Alcohol Use: No  . Drug Use: No  . Sexually Active:    Other Topics Concern  . Not on file   Social History Narrative   Pt has completed training as a Energy manager    Current Outpatient Prescriptions on File Prior to Visit  Medication Sig Dispense Refill  . citalopram (CELEXA) 40 MG tablet Take 1 tablet (40 mg total) by mouth daily.  90 tablet  3  . clotrimazole-betamethasone (LOTRISONE) cream Three times a day as needed for foot cracking       . diclofenac (VOLTAREN) 75 MG EC tablet TAKE ONE TABLET BY MOUTH TWICE DAILY  180 tablet  2  . glucose blood (ONE TOUCH TEST STRIPS) test strip Once daily dx 250.00       . KLOR-CON 8 MEQ CR tablet TAKE TWO TABLETS BY MOUTH  EVERY DAY  180 each  2  . levothyroxine (SYNTHROID, LEVOTHROID) 200 MCG tablet TAKE ONE TABLET BY MOUTH EVERY DAY  30 tablet  5  . losartan-hydrochlorothiazide (HYZAAR) 100-12.5 MG per tablet Take 1 tablet by mouth daily.        . meclizine (ANTIVERT) 12.5 MG tablet 1-2 by mouth three times a day as needed       . metFORMIN (GLUCOPHAGE-XR) 500 MG 24 hr tablet Take 2 tablets by mouth two times a day       . metoprolol succinate (TOPROL-XL) 25 MG 24 hr tablet Take 25 mg by mouth daily.        . Oxycodone HCl 10 MG TABS Take 1 tablet by mouth every 4 (four) hours as needed. For pain       . pioglitazone (ACTOS) 15 MG tablet Take 15 mg by mouth daily.        . Potassium Citrate (UROCIT-K 15) 15 MEQ (1620 MG) TBCR Take 2 tablets by mouth 2 (two) times daily.        . saxagliptin HCl (ONGLYZA) 5 MG TABS tablet Take 2.5 mg by mouth every morning.        . simvastatin (ZOCOR) 80 MG tablet Take 80 mg by mouth at bedtime.        Marland Kitchen  traZODone (DESYREL) 100 MG tablet TAKE ONE TABLET BY MOUTH AT BEDTIME  30 tablet  5  . albuterol (VENTOLIN HFA) 108 (90 BASE) MCG/ACT inhaler 2 puffs up to four times daily as needed for wheezing       . DISCONTD: citalopram (CELEXA) 40 MG tablet TAKE ONE TABLET BY MOUTH EVERY DAY  30 tablet  3  . DISCONTD: doxycycline (DORYX) 100 MG EC tablet Take 100 mg by mouth daily.        Marland Kitchen DISCONTD: mometasone (NASONEX) 50 MCG/ACT nasal spray 2 sprays by Nasal route daily.          Allergies  Allergen Reactions  . Erythromycin     REACTION: nausea/vomiting    Family History  Problem Relation Age of Onset  . Cancer Mother     Colon Cancer  . Heart attack Father   . Heart disease Father   . Allergies Sister   . Asthma Sister   . Heart disease Paternal Grandfather   . Allergies Sister   . Clotting disorder Grandchild     BP 118/76  Pulse 78  Temp(Src) 97.4 F (36.3 C) (Oral)  Ht 5\' 7"  (1.702 m)  Wt 229 lb 6.4 oz (104.055 kg)  BMI 35.93 kg/m2  SpO2 98%    Review of  Systems She reports drowsiness.  Denies dizziness and depression.    Objective:   Physical Exam GENERAL: no distress. Msk: strength is 4/5 throughout all 4's. Gait: slow but steady.  Neuro: sensation is intact to touch on the lower extremities. knee jerks are 2+ bilat. PSYCH: Alert and oriented x 3.     Lab Results  Component Value Date   HGBA1C 6.3 07/14/2010   Lab Results  Component Value Date   IRON 84 07/14/2010   Lab Results  Component Value Date   WBC 6.0 07/14/2010   HGB 11.7* 07/14/2010   HCT 33.8* 07/14/2010   MCV 90.6 07/14/2010   PLT 165.0 07/14/2010      Assessment & Plan:  Myalgias, uncertain etiology fe-deficiency anemia, improved Dm, well-controlled.

## 2010-08-05 ENCOUNTER — Other Ambulatory Visit: Payer: Self-pay | Admitting: Endocrinology

## 2010-08-29 ENCOUNTER — Other Ambulatory Visit: Payer: Self-pay | Admitting: Endocrinology

## 2010-09-04 IMAGING — CT CT HEAD W/O CM
1 series · 16 of 30 positions shown, 20 images · non-contrast
Comparison: None available.

CLINICAL DATA: Dizziness.  Minus fill finding.  Asthma.
Hypertension.

CT HEAD WITHOUT CONTRAST
TECHNIQUE: Contiguous axial images were obtained from the base of
the skull through the vertex without contrast.

[Series 2: head_seq 4.5 h37s st · axial · 0.43mm/px · z∈[+1031,+1157]mm · 16 of 32 slices shown, 20 images]
[im 2/32  brain]
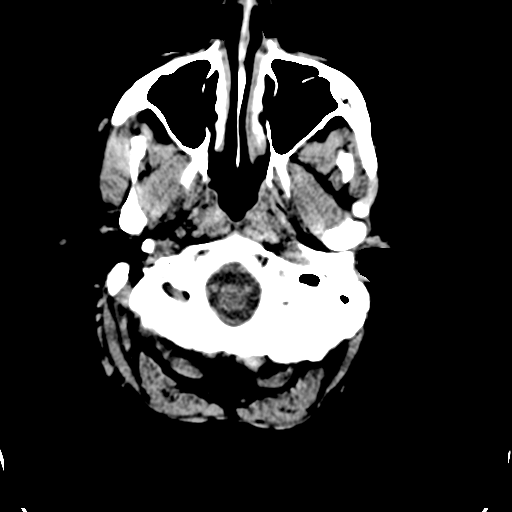
[im 2/32  bone]
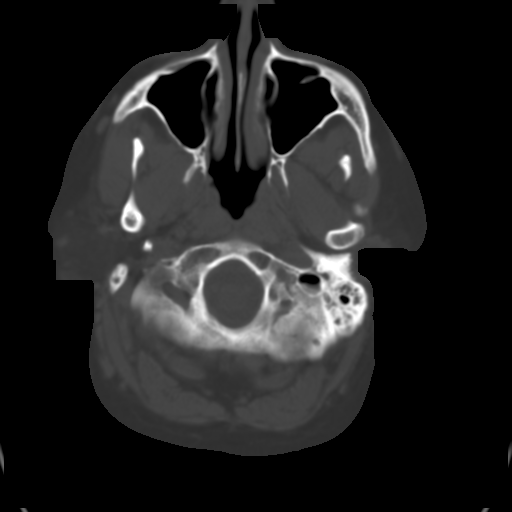
[im 4/32  brain]
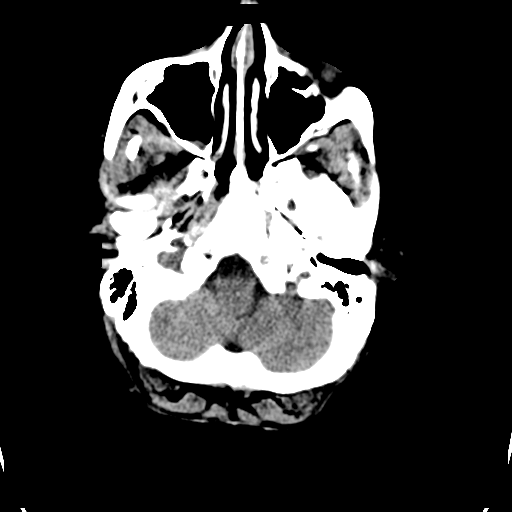
[im 6/32  brain]
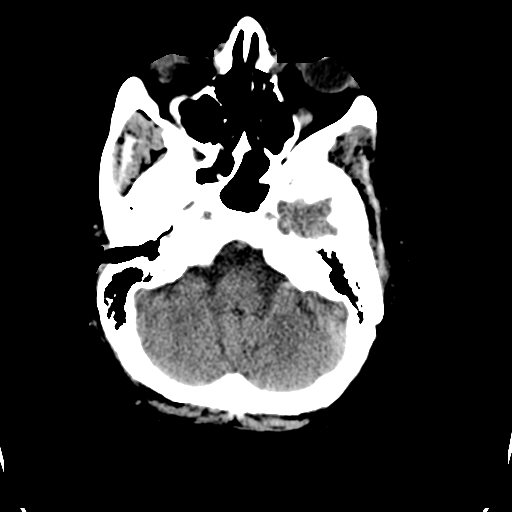
[im 8/32  brain]
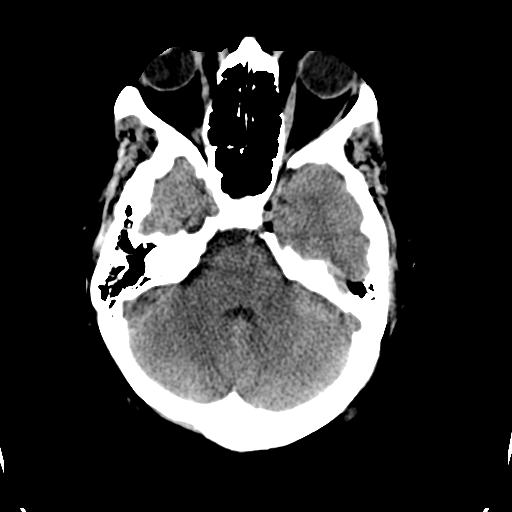
[im 9/32  brain]
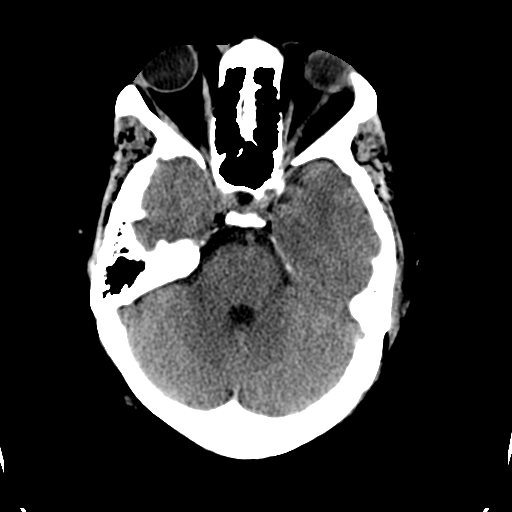
[im 9/32  bone]
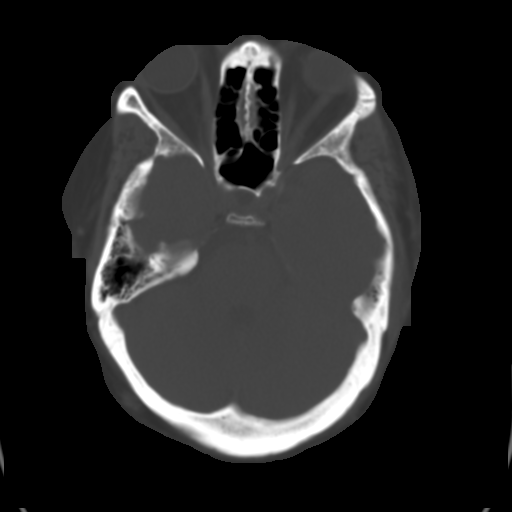
[im 11/32  brain]
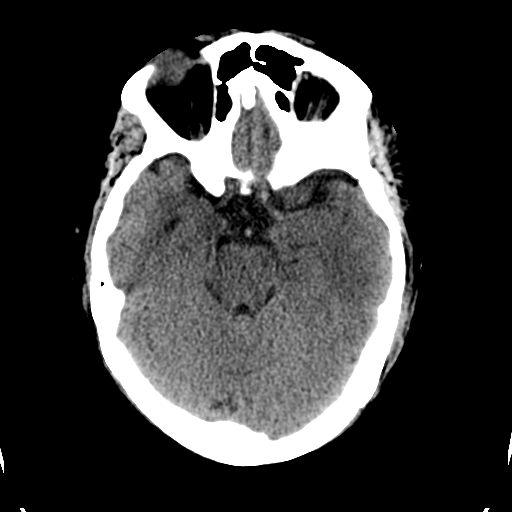
[im 13/32  brain]
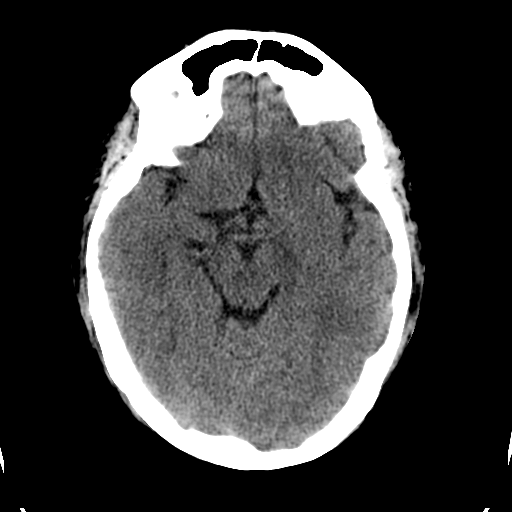
[im 15/32  brain]
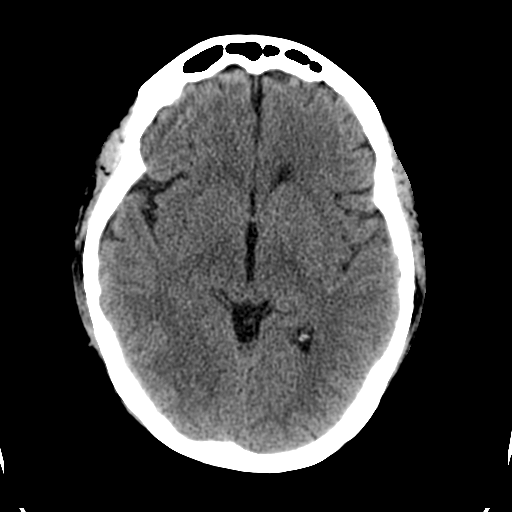
[im 17/32  brain]
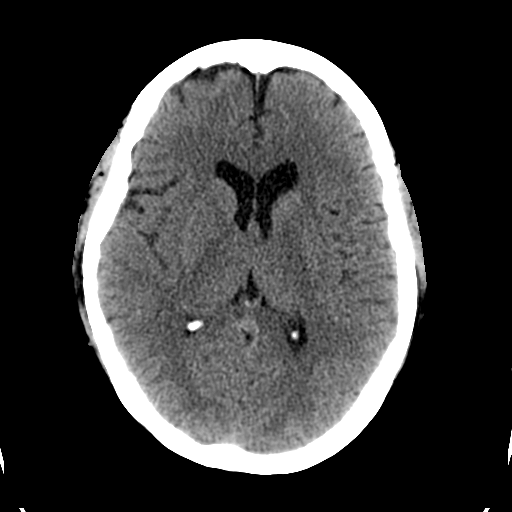
[im 17/32  bone]
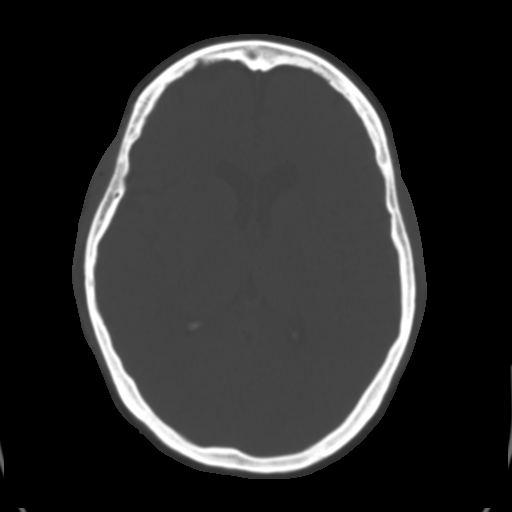
[im 19/32  brain]
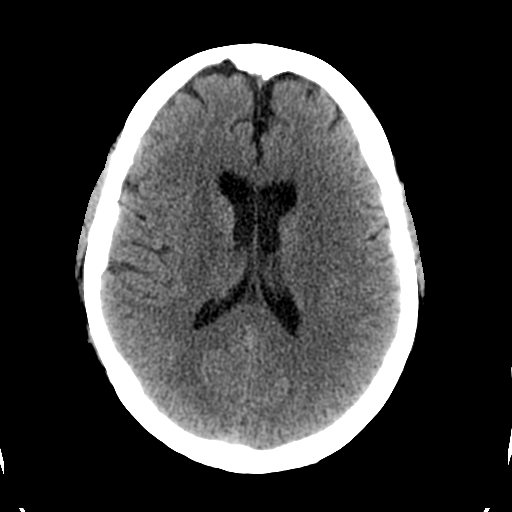
[im 21/32  brain]
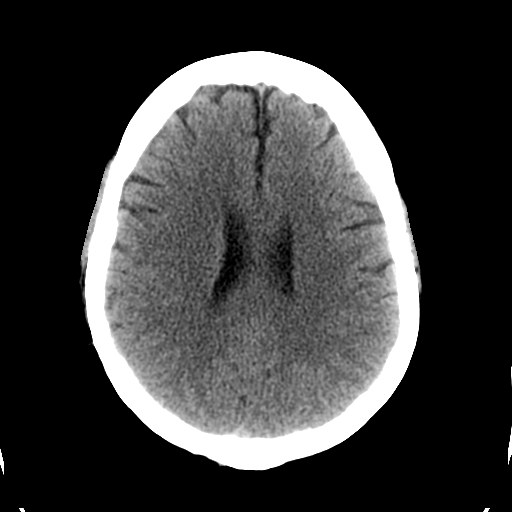
[im 23/32  brain]
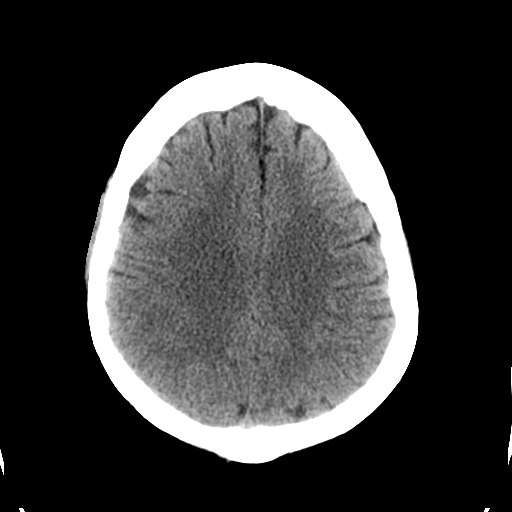
[im 24/32  brain]
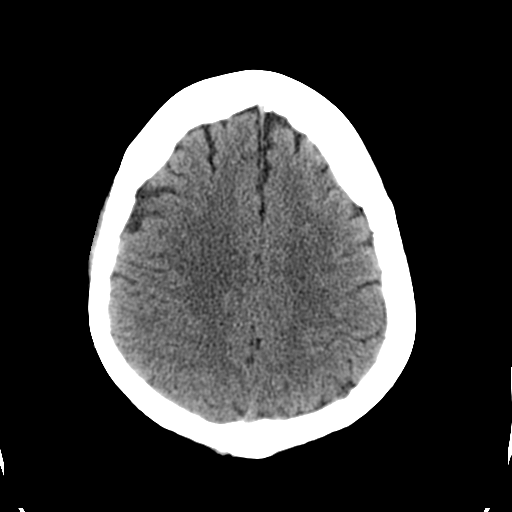
[im 24/32  bone]
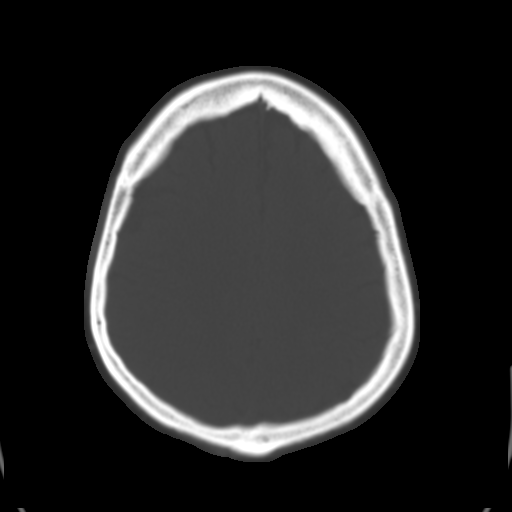
[im 26/32  brain]
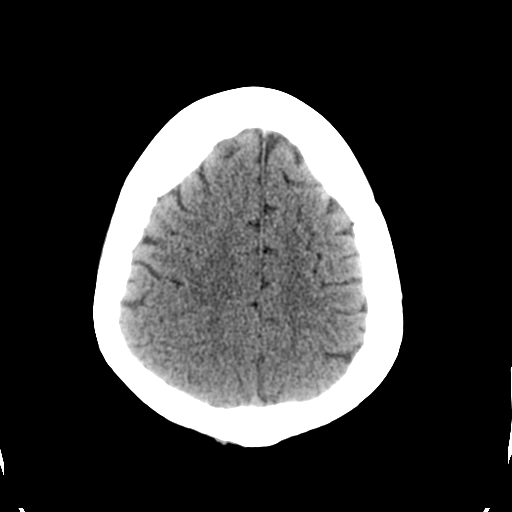
[im 28/32  brain]
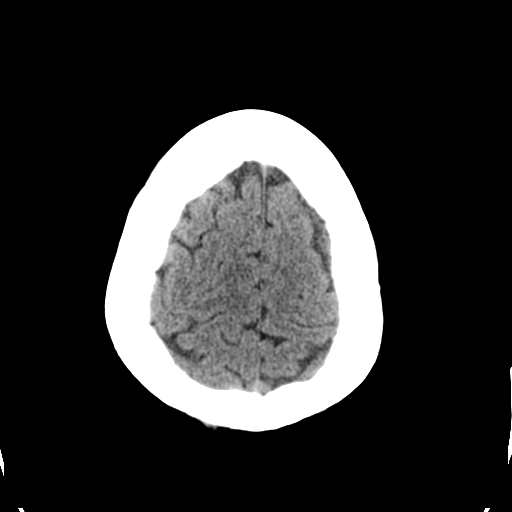
[im 30/32  brain]
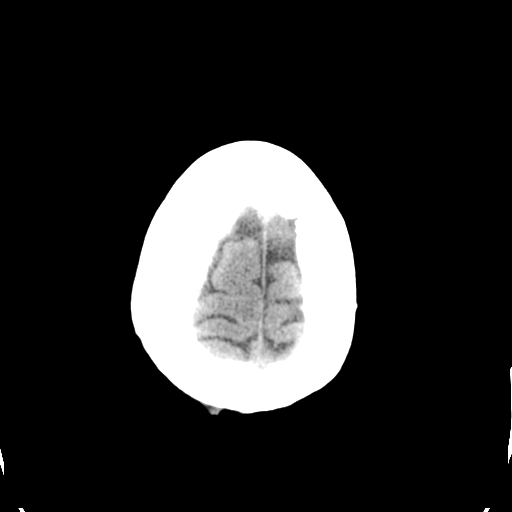

[16 of 30 positions shown; findings below may reference images not displayed]

FINDINGS: No acute intracranial abnormality is present.
Specifically, there is no evidence for acute infarct, hemorrhage,
mass, hydrocephalus, or extra-axial fluid collection.  The
paranasal sinuses and mastoid air cells are clear.  The globes and
orbits are intact.  The osseous skull is intact.
IMPRESSION: Normal CT of the brain.

## 2010-09-10 ENCOUNTER — Other Ambulatory Visit: Payer: Self-pay | Admitting: Endocrinology

## 2010-09-19 ENCOUNTER — Other Ambulatory Visit: Payer: Self-pay

## 2010-09-19 MED ORDER — OXYCODONE HCL 10 MG PO TABS: 1.0000 | ORAL_TABLET | ORAL | Status: DC | PRN

## 2010-09-19 NOTE — Telephone Encounter (Signed)
Pt informed, Rx in cabinet for pt pick up  

## 2010-09-19 NOTE — Telephone Encounter (Signed)
i printed 

## 2010-10-21 ENCOUNTER — Other Ambulatory Visit: Payer: Self-pay | Admitting: Endocrinology

## 2010-10-23 ENCOUNTER — Ambulatory Visit (INDEPENDENT_AMBULATORY_CARE_PROVIDER_SITE_OTHER): Payer: 59 | Admitting: Endocrinology

## 2010-10-23 ENCOUNTER — Ambulatory Visit (INDEPENDENT_AMBULATORY_CARE_PROVIDER_SITE_OTHER)
Admission: RE | Admit: 2010-10-23 | Discharge: 2010-10-23 | Disposition: A | Discharge status: Home / Self Care | Payer: 59 | Source: Ambulatory Visit | Attending: Endocrinology | Admitting: Endocrinology

## 2010-10-23 ENCOUNTER — Encounter: Payer: Self-pay | Admitting: Endocrinology

## 2010-10-23 ENCOUNTER — Other Ambulatory Visit (INDEPENDENT_AMBULATORY_CARE_PROVIDER_SITE_OTHER): Payer: 59

## 2010-10-23 DIAGNOSIS — E119 Type 2 diabetes mellitus without complications: Secondary | ICD-10-CM

## 2010-10-23 DIAGNOSIS — R05 Cough: Secondary | ICD-10-CM

## 2010-10-23 DIAGNOSIS — R300 Dysuria: Secondary | ICD-10-CM

## 2010-10-23 DIAGNOSIS — R059 Cough, unspecified: Secondary | ICD-10-CM

## 2010-10-23 LAB — URINALYSIS, ROUTINE W REFLEX MICROSCOPIC
Specific Gravity, Urine: 1.025 (ref 1.000–1.030)
Urobilinogen, UA: 0.2 (ref 0.0–1.0)

## 2010-10-23 LAB — HEMOGLOBIN A1C: Hgb A1c MFr Bld: 5.7 % (ref 4.6–6.5)

## 2010-10-23 MED ORDER — OXYCODONE HCL 10 MG PO TABS: 1.0000 | ORAL_TABLET | ORAL | Status: DC | PRN

## 2010-10-23 MED ORDER — ATORVASTATIN CALCIUM 10 MG PO TABS: 10.0000 mg | ORAL_TABLET | Freq: Every day | ORAL | Status: DC

## 2010-10-23 MED ORDER — CEFUROXIME AXETIL 500 MG PO TABS: 500.0000 mg | ORAL_TABLET | Freq: Two times a day (BID) | ORAL | Status: AC

## 2010-10-23 MED ORDER — PIOGLITAZONE HCL 15 MG PO TABS: 15.0000 mg | ORAL_TABLET | Freq: Every day | ORAL | Status: DC

## 2010-10-23 MED ORDER — PROMETHAZINE-CODEINE 6.25-10 MG/5ML PO SYRP: 5.0000 mL | ORAL_SOLUTION | ORAL | Status: AC | PRN

## 2010-10-23 NOTE — Patient Instructions (Addendum)
blood and urine tests are being requested for you today.  please call [phone] to hear your test results.  You will be prompted to enter the 9-digit "MRN" number that appears at the top left of this page, followed by #.  Then you will hear the message.   Here is a prescription for cough syrup.  i have sent a prescription to your pharmacy, for both the urine and respiratory infections. Try loratadine-d (non-prescription) as needed for congestion.  Try lipitor 10 mg daily. Here is a refill of your pain medicine.  Do not take it with your cough syrup, as they are related medication. Please make a follow-up appointment in 3 months (update: i left message on phone-tree:  Stop onglyza)

## 2010-10-23 NOTE — Progress Notes (Signed)
Subjective:    Patient ID: Savannah [name], female    DOB: 1955/05/09, 55 y.o.   MRN: [id number]  HPI Pt states 10 days of prod-quality cough in the chest, and assoc nasal congestion. no cbg record, but states cbg's are well-controlled. She stopped her lipitor due to myalgias. Past Medical History  Diagnosis Date  . Unspecified hypothyroidism 01/07/2007  . 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 HYPERCHOLESTEROLEMIA 04/18/2009  . HYPOKALEMIA 01/15/2007  . ANEMIA, IRON DEFICIENCY 12/06/2008  . OBSTRUCTIVE SLEEP APNEA 08/09/2009  . HYPERTENSION 09/27/2006  . ALLERGIC RHINITIS CAUSE UNSPECIFIED 12/29/2007  . ASTHMA 09/27/2006  . FATTY LIVER DISEASE 03/19/2008  . URINARY CALCULUS 11/08/2009  . 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 BACK PAIN, LUMBAR 12/29/2009  . Palpitations 05/23/2009  . ASYMPTOMATIC POSTMENOPAUSAL STATUS 03/19/2008    Past Surgical History  Procedure Date  . Knee arthroscopy 1993    History   Social History  . Marital Status: Married    Spouse Name: N/A    Number of Children: 2  . Years of Education: N/A   Occupational History  . Homemaker    Social History Main Topics  . Smoking status: Former Smoker    Quit date: 02/27/1975  . Smokeless tobacco: Not on file  . Alcohol Use: No  . Drug Use: No  . Sexually Active:    Other Topics Concern  . Not on file   Social History Narrative   Pt has completed training as a Energy manager    Current Outpatient Prescriptions on File Prior to Visit  Medication Sig Dispense Refill  . citalopram (CELEXA) 40 MG tablet Take 1 tablet (40 mg total) by mouth daily.  90 tablet  3  . clotrimazole-betamethasone (LOTRISONE) cream Three times a day as needed for foot cracking       . diclofenac (VOLTAREN) 75 MG EC tablet TAKE ONE TABLET BY MOUTH TWICE DAILY  180 tablet  2  . glucose blood (ONE TOUCH TEST STRIPS) test strip Once daily dx 250.00       . KLOR-CON 8 MEQ CR tablet TAKE TWO TABLETS BY MOUTH EVERY DAY  180 each  2  . levothyroxine  (SYNTHROID, LEVOTHROID) 200 MCG tablet TAKE ONE TABLET BY MOUTH EVERY DAY  30 tablet  5  . losartan-hydrochlorothiazide (HYZAAR) 100-12.5 MG per tablet TAKE 1 TABLET BY MOUTH EVERY DAY  30 tablet  5  . meclizine (ANTIVERT) 12.5 MG tablet 1-2 by mouth three times a day as needed       . metFORMIN (GLUCOPHAGE-XR) 500 MG 24 hr tablet TAKE TWO TABLETS BY MOUTH TWICE DAILY  120 tablet  5  . metoprolol succinate (TOPROL-XL) 25 MG 24 hr tablet TAKE 1 TABLET BY MOUTH ONCE DAILY  90 tablet  1  . Oxycodone HCl 10 MG TABS Take 1 tablet (10 mg total) by mouth every 4 (four) hours as needed. For pain  50 each  0  . pioglitazone (ACTOS) 15 MG tablet Take 15 mg by mouth daily.        . Potassium Citrate (UROCIT-K 15) 15 MEQ (1620 MG) TBCR Take 2 tablets by mouth 2 (two) times daily.        . saxagliptin HCl (ONGLYZA) 5 MG TABS tablet Take 2.5 mg by mouth every morning.        . traZODone (DESYREL) 100 MG tablet TAKE ONE TABLET BY MOUTH AT BEDTIME  30 tablet  5  . albuterol (VENTOLIN HFA) 108 (90 BASE) MCG/ACT inhaler 2 puffs up  to four times daily as needed for wheezing       . simvastatin (ZOCOR) 80 MG tablet TAKE 1 TABLET BY MOUTH AT BED TIME  90 tablet  2   Allergies  Allergen Reactions  . Erythromycin     REACTION: nausea/vomiting   Family History  Problem Relation Age of Onset  . Cancer Mother     Colon Cancer  . Heart attack Father   . Heart disease Father   . Allergies Sister   . Asthma Sister   . Heart disease Paternal Grandfather   . Allergies Sister   . Clotting disorder Grandchild    BP 118/70  Pulse 94  Temp(Src) 98.5 F (36.9 C) (Oral)  Ht 5\' 8"  (1.727 m)  Wt 228 lb 9.6 oz (103.692 kg)  BMI 34.76 kg/m2  SpO2 97%  Review of Systems  Constitutional: Negative for unexpected weight change.  HENT: Negative for tinnitus.   Cardiovascular: Negative for chest pain.  Neurological: Negative for syncope.  Psychiatric/Behavioral: Positive for sleep disturbance.   She has bilat  earache, low-back pain, dysuria.  Fever is resolved. Denies n/v and sob.  Myalgias are much better.    Objective:   Physical Exam VITAL SIGNS:  See vs page GENERAL: no distress head: no deformity eyes: no periorbital swelling, no proptosis external nose and ears are normal mouth: no lesion seen Both eac's are normal.  Right tm is very red.  Left is less red. LUNGS:  Clear to auscultation.   Lab Results  Component Value Date   HGBA1C 5.7 10/23/2010   Lab Results  Component Value Date   CHOL 113 05/09/2010   CHOL 109 04/15/2009   CHOL 119 03/16/2008   Lab Results  Component Value Date   HDL 34.20* 05/09/2010   HDL 45.30 04/15/2009   HDL 54.0* 03/16/2008   Lab Results  Component Value Date   LDLCALC 45 05/09/2010   LDLCALC 35 04/15/2009   LDLCALC 53 03/16/2008   Lab Results  Component Value Date   TRIG 168.0* 05/09/2010   TRIG 145.0 04/15/2009   TRIG 139 03/16/2008   Lab Results  Component Value Date   CHOLHDL 3 05/09/2010   CHOLHDL 2 04/15/2009   CHOLHDL 3.1 CALC 03/16/2008   ua is pos      Assessment & Plan:  uri, new Dm.  She does not need this much medication. Chronic pain syndrome.  Pain med and cough syrup could interact.   Dyslipidemia, therapy is limited by perceived drug intolerance, but her ldl is very good.  Therefore, she could be well-controlled with a lower dosage of lipitor Uti, new

## 2010-11-06 ENCOUNTER — Other Ambulatory Visit: Payer: Self-pay | Admitting: Endocrinology

## 2010-11-07 ENCOUNTER — Ambulatory Visit: Payer: 59 | Admitting: Endocrinology

## 2010-11-07 NOTE — Telephone Encounter (Signed)
Pt states she has no SOB or fever. Rx faxed to [facility].

## 2010-11-07 NOTE — Telephone Encounter (Signed)
Please verify no fever or sob. If not, i have printed refill

## 2010-11-07 NOTE — Telephone Encounter (Signed)
Please advise 

## 2010-11-12 ENCOUNTER — Other Ambulatory Visit: Payer: Self-pay | Admitting: Endocrinology

## 2010-11-27 LAB — URINALYSIS, ROUTINE W REFLEX MICROSCOPIC
Glucose, UA: NEGATIVE
Nitrite: POSITIVE — AB
Specific Gravity, Urine: 1.029
pH: 5

## 2010-11-27 LAB — URINE MICROSCOPIC-ADD ON

## 2010-11-27 LAB — URINE CULTURE

## 2010-11-27 LAB — GLUCOSE, CAPILLARY: Glucose-Capillary: 122 — ABNORMAL HIGH

## 2010-12-01 ENCOUNTER — Encounter (INDEPENDENT_AMBULATORY_CARE_PROVIDER_SITE_OTHER): Payer: Self-pay | Admitting: Surgery

## 2010-12-05 ENCOUNTER — Ambulatory Visit (INDEPENDENT_AMBULATORY_CARE_PROVIDER_SITE_OTHER): Payer: 59 | Admitting: Surgery

## 2010-12-05 ENCOUNTER — Encounter (INDEPENDENT_AMBULATORY_CARE_PROVIDER_SITE_OTHER): Payer: Self-pay | Admitting: Surgery

## 2010-12-05 VITALS — BP 130/82 | HR 96 | Temp 98.1°F | Resp 18 | Ht 68.0 in | Wt 231.4 lb

## 2010-12-05 DIAGNOSIS — E210 Primary hyperparathyroidism: Secondary | ICD-10-CM

## 2010-12-05 NOTE — Progress Notes (Signed)
Subjective:     Patient ID: Savannah [name], female   DOB: [birth year], 55 y.o.   MRN: [id number]  HPI She is here for a followup visit of her hyperparathyroidism. Again, she has elevated parathyroid hormone, multiple kidney stones, but minimally elevated calcium. Her last PTH level was 103. When I saw her last we were unable to localize a parathyroid adenoma on preoperative scanning. At that point we decided to forego a neck exploration. She is referred back again by Dr. Patsi Sears to discuss this further  Review of Systems     Objective:   Physical Exam    Again, on physical examination there are no palpable neck masses. Assessment:     Hyperparathyroidism    Plan:     At this point, I again discussed options. This included reimaging her neck versus an attempt at minimally invasive parathyroid surgery with injection the day of surgery with an understanding that a total neck exploration course may be necessary versus continued medical management with even perhaps vitamin D replacement. At this point she is going to again get Dr. George Hugh opinion regarding this. I will see her back after that.

## 2010-12-06 ENCOUNTER — Encounter: Payer: Self-pay | Admitting: Endocrinology

## 2010-12-06 ENCOUNTER — Ambulatory Visit (INDEPENDENT_AMBULATORY_CARE_PROVIDER_SITE_OTHER): Payer: 59 | Admitting: Endocrinology

## 2010-12-06 ENCOUNTER — Other Ambulatory Visit (INDEPENDENT_AMBULATORY_CARE_PROVIDER_SITE_OTHER): Payer: 59

## 2010-12-06 DIAGNOSIS — E213 Hyperparathyroidism, unspecified: Secondary | ICD-10-CM

## 2010-12-06 DIAGNOSIS — E559 Vitamin D deficiency, unspecified: Secondary | ICD-10-CM

## 2010-12-06 LAB — BASIC METABOLIC PANEL
Calcium: 9 mg/dL (ref 8.4–10.5)
GFR: 60.2 mL/min (ref >60.00)
Potassium: 4.4 mEq/L (ref 3.5–5.1)
Sodium: 140 mEq/L (ref 135–145)

## 2010-12-06 NOTE — Patient Instructions (Addendum)
blood tests are being requested for you today.  please call [phone] to hear your test results.  You will be prompted to enter the 9-digit "MRN" number that appears at the top left of this page, followed by #.  Then you will hear the message. (update: i left message on phone-tree:  You need only take vit-d 2000 units/day.

## 2010-12-06 NOTE — Progress Notes (Signed)
Subjective:    Patient ID: Savannah [name], female    DOB: 30-May-1955, 55 y.o.   MRN: [id number]  HPI Pt was ref to dr Patsi Sears for urolithiasis.  elev pth was noted, and the question has been raised as to whether this is primary or secondary.  She says no h/o osteoporosis.  She fx right wrist (age 27), and left "elbow" (approx age 17).  Both falls were with roller skating.   Past Medical History  Diagnosis Date  . Unspecified hypothyroidism 01/07/2007  . 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 HYPERCHOLESTEROLEMIA 04/18/2009  . HYPOKALEMIA 01/15/2007  . ANEMIA, IRON DEFICIENCY 12/06/2008  . OBSTRUCTIVE SLEEP APNEA 08/09/2009  . HYPERTENSION 09/27/2006  . ALLERGIC RHINITIS CAUSE UNSPECIFIED 12/29/2007  . ASTHMA 09/27/2006  . FATTY LIVER DISEASE 03/19/2008  . URINARY CALCULUS 11/08/2009  . 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 BACK PAIN, LUMBAR 12/29/2009  . Palpitations 05/23/2009  . ASYMPTOMATIC POSTMENOPAUSAL STATUS 03/19/2008  . Chronic kidney disease     Past Surgical History  Procedure Date  . Knee arthroscopy 1993  . Hernia repair   . Cardiac catheterization 2011    History   Social History  . Marital Status: Married    Spouse Name: N/A    Number of Children: 2  . Years of Education: N/A   Occupational History  . Homemaker    Social History Main Topics  . Smoking status: Former Smoker    Quit date: 02/27/1975  . Smokeless tobacco: Not on file  . Alcohol Use: No  . Drug Use: No  . Sexually Active:    Other Topics Concern  . Not on file   Social History Narrative   Pt has completed training as a Energy manager    Current Outpatient Prescriptions on File Prior to Visit  Medication Sig Dispense Refill  . albuterol (VENTOLIN HFA) 108 (90 BASE) MCG/ACT inhaler 2 puffs up to four times daily as needed for wheezing       . atorvastatin (LIPITOR) 10 MG tablet Take 1 tablet (10 mg total) by mouth daily.  30 tablet  11  . citalopram (CELEXA) 40 MG tablet Take 1 tablet (40 mg total) by  mouth daily.  90 tablet  3  . clotrimazole-betamethasone (LOTRISONE) cream Three times a day as needed for foot cracking       . diclofenac (VOLTAREN) 75 MG EC tablet TAKE ONE TABLET BY MOUTH TWICE DAILY  180 tablet  2  . glucose blood (ONE TOUCH TEST STRIPS) test strip Once daily dx 250.00       . levothyroxine (SYNTHROID, LEVOTHROID) 200 MCG tablet TAKE ONE TABLET BY MOUTH EVERY DAY  30 tablet  5  . losartan-hydrochlorothiazide (HYZAAR) 100-12.5 MG per tablet TAKE 1 TABLET BY MOUTH EVERY DAY  30 tablet  5  . meclizine (ANTIVERT) 12.5 MG tablet 1-2 by mouth three times a day as needed       . metFORMIN (GLUCOPHAGE-XR) 500 MG 24 hr tablet TAKE TWO TABLETS BY MOUTH TWICE DAILY  120 tablet  5  . metoprolol succinate (TOPROL-XL) 25 MG 24 hr tablet TAKE 1 TABLET BY MOUTH ONCE DAILY  90 tablet  1  . Oxycodone HCl 10 MG TABS Take 1 tablet (10 mg total) by mouth every 4 (four) hours as needed. For pain  100 each  0  . pioglitazone (ACTOS) 15 MG tablet Take 1 tablet (15 mg total) by mouth daily.  30 tablet  11  . Potassium Citrate (UROCIT-K 15) 15 MEQ (  1620 MG) TBCR Take 2 tablets by mouth 2 (two) times daily.        . simvastatin (ZOCOR) 80 MG tablet TAKE 1 TABLET BY MOUTH AT BED TIME  90 tablet  2  . traZODone (DESYREL) 100 MG tablet TAKE ONE TABLET BY MOUTH AT BEDTIME  30 tablet  2    Allergies  Allergen Reactions  . Erythromycin     REACTION: nausea/vomiting    Family History  Problem Relation Age of Onset  . Cancer Mother     Colon Cancer  . Heart attack Father   . Heart disease Father   . Allergies Sister   . Asthma Sister   . Heart disease Paternal Grandfather   . Allergies Sister   . Clotting disorder Grandchild     BP 126/82  Pulse 82  Temp(Src) 98.4 F (36.9 C) (Oral)  Ht 5\' 8"  (1.727 m)  Wt 231 lb 3.2 oz (104.872 kg)  BMI 35.15 kg/m2  SpO2 96%    Review of Systems Denies loc.  She seldom has cramps.      Objective:   Physical Exam VITAL SIGNS:  See vs  page GENERAL: no distress Gait: favors right lower extremity (chronically ruptured right achilles tendon)  (dexa was normal earlier this year)  outside test results are reviewed: Pth=103 Ca++=9.1 Lab Results  Component Value Date   PTH 84.0* 12/06/2010   CALCIUM 9.0 12/06/2010   Vitamin-d=29    Assessment & Plan:  Hyperparathyroidism, prob secondary. Urolithiasis, unlikely related to elev pth Vit-d deficiency, mild.

## 2010-12-07 LAB — VITAMIN D 25 HYDROXY (VIT D DEFICIENCY, FRACTURES): Vit D, 25-Hydroxy: 29 ng/mL — ABNORMAL LOW (ref 30–89)

## 2010-12-07 LAB — PTH, INTACT AND CALCIUM: PTH: 84 pg/mL — ABNORMAL HIGH (ref 14.0–72.0)

## 2010-12-09 DIAGNOSIS — E559 Vitamin D deficiency, unspecified: Secondary | ICD-10-CM | POA: Insufficient documentation

## 2010-12-28 ENCOUNTER — Other Ambulatory Visit: Payer: Self-pay | Admitting: Endocrinology

## 2010-12-29 ENCOUNTER — Ambulatory Visit (INDEPENDENT_AMBULATORY_CARE_PROVIDER_SITE_OTHER): Payer: 59 | Admitting: *Deleted

## 2010-12-29 DIAGNOSIS — Z23 Encounter for immunization: Secondary | ICD-10-CM

## 2011-01-20 ENCOUNTER — Ambulatory Visit: Payer: 59 | Admitting: Family Medicine

## 2011-01-22 ENCOUNTER — Ambulatory Visit: Payer: 59 | Admitting: Endocrinology

## 2011-02-08 ENCOUNTER — Other Ambulatory Visit: Payer: Self-pay | Admitting: Endocrinology

## 2011-03-02 ENCOUNTER — Other Ambulatory Visit: Payer: Self-pay | Admitting: Endocrinology

## 2011-03-08 ENCOUNTER — Other Ambulatory Visit: Payer: Self-pay | Admitting: Endocrinology

## 2011-03-29 ENCOUNTER — Ambulatory Visit: Payer: 59 | Admitting: Endocrinology

## 2011-04-02 ENCOUNTER — Encounter: Payer: Self-pay | Admitting: Endocrinology

## 2011-04-02 ENCOUNTER — Ambulatory Visit (INDEPENDENT_AMBULATORY_CARE_PROVIDER_SITE_OTHER): Payer: BC Managed Care – PPO | Admitting: Endocrinology

## 2011-04-02 VITALS — BP 124/76 | HR 88 | Temp 97.4°F | Ht 68.0 in | Wt 234.0 lb

## 2011-04-02 DIAGNOSIS — H6690 Otitis media, unspecified, unspecified ear: Secondary | ICD-10-CM

## 2011-04-02 MED ORDER — POTASSIUM CITRATE ER 15 MEQ (1620 MG) PO TBCR: 2.0000 | EXTENDED_RELEASE_TABLET | Freq: Two times a day (BID) | ORAL | Status: DC

## 2011-04-02 MED ORDER — LOSARTAN POTASSIUM-HCTZ 100-12.5 MG PO TABS: 1.0000 | ORAL_TABLET | Freq: Every day | ORAL | Status: DC

## 2011-04-02 MED ORDER — PIOGLITAZONE HCL 15 MG PO TABS: 15.0000 mg | ORAL_TABLET | Freq: Every day | ORAL | Status: DC

## 2011-04-02 MED ORDER — OXYCODONE HCL 10 MG PO TABS: 10.0000 mg | ORAL_TABLET | ORAL | Status: DC | PRN

## 2011-04-02 MED ORDER — METOPROLOL SUCCINATE ER 25 MG PO TB24: 25.0000 mg | ORAL_TABLET | Freq: Every day | ORAL | Status: DC

## 2011-04-02 MED ORDER — DOXYCYCLINE HYCLATE 100 MG PO TABS: 100.0000 mg | ORAL_TABLET | Freq: Two times a day (BID) | ORAL | Status: AC

## 2011-04-02 MED ORDER — ATORVASTATIN CALCIUM 10 MG PO TABS: 10.0000 mg | ORAL_TABLET | Freq: Every day | ORAL | Status: DC

## 2011-04-02 NOTE — Progress Notes (Signed)
Subjective:    Patient ID: Savannah [name], female    DOB: 05/26/1955, 56 y.o.   MRN: [id number]  HPI Pt has recently regained her insurance, as her husband changed jobs.  Pt states 1 week of moderate pain at both ears, but no assoc "blocked" sensation.   Past Medical History  Diagnosis Date  . Unspecified hypothyroidism 01/07/2007  . 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 HYPERCHOLESTEROLEMIA 04/18/2009  . HYPOKALEMIA 01/15/2007  . ANEMIA, IRON DEFICIENCY 12/06/2008  . OBSTRUCTIVE SLEEP APNEA 08/09/2009  . HYPERTENSION 09/27/2006  . ALLERGIC RHINITIS CAUSE UNSPECIFIED 12/29/2007  . ASTHMA 09/27/2006  . FATTY LIVER DISEASE 03/19/2008  . URINARY CALCULUS 11/08/2009  . 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 BACK PAIN, LUMBAR 12/29/2009  . Palpitations 05/23/2009  . ASYMPTOMATIC POSTMENOPAUSAL STATUS 03/19/2008  . Chronic kidney disease     Past Surgical History  Procedure Date  . Knee arthroscopy 1993  . Hernia repair   . Cardiac catheterization 2011    History   Social History  . Marital Status: Married    Spouse Name: N/A    Number of Children: 2  . Years of Education: N/A   Occupational History  . Homemaker    Social History Main Topics  . Smoking status: Former Smoker    Quit date: 02/27/1975  . Smokeless tobacco: Not on file  . Alcohol Use: No  . Drug Use: No  . Sexually Active:    Other Topics Concern  . Not on file   Social History Narrative   Pt has completed training as a Energy manager    Current Outpatient Prescriptions on File Prior to Visit  Medication Sig Dispense Refill  . albuterol (VENTOLIN HFA) 108 (90 BASE) MCG/ACT inhaler 2 puffs up to four times daily as needed for wheezing       . citalopram (CELEXA) 40 MG tablet Take 1 tablet (40 mg total) by mouth daily.  90 tablet  3  . clotrimazole-betamethasone (LOTRISONE) cream Three times a day as needed for foot cracking       . diclofenac (VOLTAREN) 75 MG EC tablet TAKE ONE TABLET BY MOUTH TWICE DAILY  180 tablet  2    . glucose blood (ONE TOUCH TEST STRIPS) test strip Once daily dx 250.00       . levothyroxine (SYNTHROID, LEVOTHROID) 200 MCG tablet TAKE ONE TABLET BY MOUTH EVERY DAY  30 tablet  5  . meclizine (ANTIVERT) 12.5 MG tablet 1-2 by mouth three times a day as needed       . metFORMIN (GLUCOPHAGE-XR) 500 MG 24 hr tablet TAKE TWO TABLETS BY MOUTH TWICE DAILY  120 tablet  5  . traZODone (DESYREL) 100 MG tablet TAKE ONE TABLET BY MOUTH AT BEDTIME  30 tablet  3    Allergies  Allergen Reactions  . Erythromycin     REACTION: nausea/vomiting    Family History  Problem Relation Age of Onset  . Cancer Mother     Colon Cancer  . Heart attack Father   . Heart disease Father   . Allergies Sister   . Asthma Sister   . Heart disease Paternal Grandfather   . Allergies Sister   . Clotting disorder Grandchild     BP 124/76  Pulse 88  Temp(Src) 97.4 F (36.3 C) (Oral)  Ht 5\' 8"  (1.727 m)  Wt 234 lb (106.142 kg)  BMI 35.58 kg/m2  SpO2 99%   Review of Systems Denies fever.  Pain med works well.  Objective:   Physical Exam VITAL SIGNS:  See vs page GENERAL: no distress Ears:  Both tm's are red     Assessment & Plan:  bilat AOM, new Low-back-pain, well-controlled HTN, well-controlled

## 2011-04-02 NOTE — Patient Instructions (Addendum)
i have sent a prescription to your pharmacy, for an antibiotic.   Please come back for a regular physical appointment in late may, or in June.

## 2011-06-14 ENCOUNTER — Other Ambulatory Visit: Payer: Self-pay | Admitting: Endocrinology

## 2011-06-25 ENCOUNTER — Other Ambulatory Visit: Payer: Self-pay

## 2011-06-25 MED ORDER — OXYCODONE HCL 10 MG PO TABS: 10.0000 mg | ORAL_TABLET | ORAL | Status: DC | PRN

## 2011-06-25 NOTE — Telephone Encounter (Signed)
Pt informed, Rx in cabinet for pt pick up  

## 2011-06-27 ENCOUNTER — Encounter: Payer: Self-pay | Admitting: Pulmonary Disease

## 2011-06-27 ENCOUNTER — Ambulatory Visit (INDEPENDENT_AMBULATORY_CARE_PROVIDER_SITE_OTHER): Payer: BC Managed Care – PPO | Admitting: Pulmonary Disease

## 2011-06-27 VITALS — BP 120/72 | HR 72 | Temp 98.6°F | Ht 67.0 in | Wt 239.0 lb

## 2011-06-27 DIAGNOSIS — G4733 Obstructive sleep apnea (adult) (pediatric): Secondary | ICD-10-CM

## 2011-06-27 NOTE — Assessment & Plan Note (Signed)
The patient had been wearing CPAP compliantly, and feels that Savannah [name] continues to do well with the device.  Savannah [name] is overdue for a new mask and supplies, he would also like her machine checked to make sure that it is functioning properly.  I have asked her to stay on CPAP compliantly, and to work aggressively on weight loss.  Savannah [name] will followup in one year if doing well.

## 2011-06-27 NOTE — Patient Instructions (Signed)
Will get your machine checked, and make sure in working order Will get you new mask and supplies. Work on weight loss followup with me in 1 year if doing well.

## 2011-06-27 NOTE — Progress Notes (Signed)
  Subjective:    Patient ID: Savannah [name], female    DOB: May 19, 1955, 56 y.o.   MRN: [id number]  HPI The patient comes in today for followup of her known obstructive sleep apnea.  She is wearing CPAP compliantly, but has not kept up with supplies and mask changes.  She is having some leaking from her nasal pillows, and would also like to get her machine checked.  She feels that she sleeps fairly well with the mask and machine, and that it has definitely improved her daytime alertness.  Her weight is stable since the last visit.   Review of Systems  Constitutional: Negative for fever and unexpected weight change.  HENT: Positive for ear pain, sneezing and sinus pressure. Negative for nosebleeds, congestion, sore throat, rhinorrhea, trouble swallowing, dental problem and postnasal drip.   Eyes: Negative for redness and itching.  Respiratory: Negative for cough, chest tightness, shortness of breath and wheezing.   Cardiovascular: Negative for palpitations and leg swelling.  Gastrointestinal: Negative for nausea and vomiting.  Genitourinary: Negative for dysuria.  Musculoskeletal: Negative for joint swelling.  Skin: Negative for rash.  Neurological: Positive for headaches.  Hematological: Does not bruise/bleed easily.  Psychiatric/Behavioral: Negative for dysphoric mood. The patient is not nervous/anxious.        Objective:   Physical Exam Overweight female in no acute distress No skin breakdown or pressure necrosis from the CPAP mask Lower extremities without significant edema, no cyanosis Alert and oriented, moves all 4 extremities.       Assessment & Plan:

## 2011-07-06 ENCOUNTER — Other Ambulatory Visit: Payer: Self-pay | Admitting: Endocrinology

## 2011-07-24 ENCOUNTER — Ambulatory Visit (INDEPENDENT_AMBULATORY_CARE_PROVIDER_SITE_OTHER): Payer: BC Managed Care – PPO | Admitting: Endocrinology

## 2011-07-24 ENCOUNTER — Encounter: Payer: Self-pay | Admitting: Endocrinology

## 2011-07-24 ENCOUNTER — Other Ambulatory Visit (INDEPENDENT_AMBULATORY_CARE_PROVIDER_SITE_OTHER): Payer: BC Managed Care – PPO

## 2011-07-24 VITALS — BP 138/72 | HR 80 | Temp 97.8°F | Ht 68.0 in | Wt 240.0 lb

## 2011-07-24 DIAGNOSIS — R319 Hematuria, unspecified: Secondary | ICD-10-CM

## 2011-07-24 DIAGNOSIS — E039 Hypothyroidism, unspecified: Secondary | ICD-10-CM

## 2011-07-24 DIAGNOSIS — D509 Iron deficiency anemia, unspecified: Secondary | ICD-10-CM

## 2011-07-24 DIAGNOSIS — N209 Urinary calculus, unspecified: Secondary | ICD-10-CM

## 2011-07-24 DIAGNOSIS — K7689 Other specified diseases of liver: Secondary | ICD-10-CM

## 2011-07-24 DIAGNOSIS — R42 Dizziness and giddiness: Secondary | ICD-10-CM

## 2011-07-24 DIAGNOSIS — E119 Type 2 diabetes mellitus without complications: Secondary | ICD-10-CM

## 2011-07-24 DIAGNOSIS — M479 Spondylosis, unspecified: Secondary | ICD-10-CM

## 2011-07-24 DIAGNOSIS — E7800 Pure hypercholesterolemia, unspecified: Secondary | ICD-10-CM

## 2011-07-24 DIAGNOSIS — E349 Endocrine disorder, unspecified: Secondary | ICD-10-CM

## 2011-07-24 DIAGNOSIS — I10 Essential (primary) hypertension: Secondary | ICD-10-CM

## 2011-07-24 DIAGNOSIS — M1480 Arthropathies in other specified diseases classified elsewhere, unspecified site: Secondary | ICD-10-CM

## 2011-07-24 LAB — CBC WITH DIFFERENTIAL/PLATELET
Basophils Absolute: 0 10*3/uL (ref 0.0–0.1)
Basophils Relative: 0.6 % (ref 0.0–3.0)
Eosinophils Absolute: 0.2 10*3/uL (ref 0.0–0.7)
HCT: 34.9 % — ABNORMAL LOW (ref 36.0–46.0)
Hemoglobin: 11.7 g/dL — ABNORMAL LOW (ref 12.0–15.0)
Lymphs Abs: 1.5 10*3/uL (ref 0.7–4.0)
MCHC: 33.5 g/dL (ref 30.0–36.0)
MCV: 91 fl (ref 78.0–100.0)
Monocytes Absolute: 0.3 10*3/uL (ref 0.1–1.0)
Neutro Abs: 2.9 10*3/uL (ref 1.4–7.7)
RDW: 14 % (ref 11.5–14.6)

## 2011-07-24 LAB — URINALYSIS, ROUTINE W REFLEX MICROSCOPIC
Bilirubin Urine: NEGATIVE
Urine Glucose: NEGATIVE
Urobilinogen, UA: 0.2 (ref 0.0–1.0)

## 2011-07-24 LAB — HEPATIC FUNCTION PANEL
ALT: 52 U/L — ABNORMAL HIGH (ref 0–35)
AST: 42 U/L — ABNORMAL HIGH (ref 0–37)
Albumin: 3.9 g/dL (ref 3.5–5.2)
Total Protein: 6.8 g/dL (ref 6.0–8.3)

## 2011-07-24 LAB — LIPID PANEL
Cholesterol: 183 mg/dL (ref 0–200)
HDL: 49.1 mg/dL (ref >39.00)
Total CHOL/HDL Ratio: 4
Triglycerides: 205 mg/dL — ABNORMAL HIGH (ref 0.0–149.0)

## 2011-07-24 LAB — BASIC METABOLIC PANEL
CO2: 25 mEq/L (ref 19–32)
Calcium: 9.1 mg/dL (ref 8.4–10.5)
Chloride: 106 mEq/L (ref 96–112)
Glucose, Bld: 132 mg/dL — ABNORMAL HIGH (ref 70–99)
Sodium: 139 mEq/L (ref 135–145)

## 2011-07-24 LAB — IBC PANEL: Transferrin: 309.6 mg/dL (ref 212.0–360.0)

## 2011-07-24 LAB — LDL CHOLESTEROL, DIRECT: Direct LDL: 115.7 mg/dL

## 2011-07-24 LAB — VITAMIN B12: Vitamin B-12: 276 pg/mL (ref 211–911)

## 2011-07-24 LAB — MICROALBUMIN / CREATININE URINE RATIO: Microalb Creat Ratio: 0.7 mg/g (ref 0.0–30.0)

## 2011-07-24 LAB — URIC ACID: Uric Acid, Serum: 5.4 mg/dL (ref 2.4–7.0)

## 2011-07-24 LAB — TSH: TSH: 0.51 u[IU]/mL (ref 0.35–5.50)

## 2011-07-24 MED ORDER — MECLIZINE HCL 12.5 MG PO TABS: 12.5000 mg | ORAL_TABLET | Freq: Three times a day (TID) | ORAL | Status: DC | PRN

## 2011-07-24 MED ORDER — OXYCODONE HCL 10 MG PO TABS: 10.0000 mg | ORAL_TABLET | ORAL | Status: DC | PRN

## 2011-07-24 MED ORDER — TRIAMCINOLONE ACETONIDE 0.1 % EX CREA: TOPICAL_CREAM | Freq: Three times a day (TID) | CUTANEOUS | Status: AC | PRN

## 2011-07-24 NOTE — Patient Instructions (Addendum)
please consider these measures for your health:  minimize alcohol.  do not use tobacco products.  have a colonoscopy at least every 10 years from age 57.  Women should have an annual mammogram from age 76.  keep firearms safely stored.  always use seat belts.  have working smoke alarms in your home.  see an eye doctor and dentist regularly.  never drive under the influence of alcohol or drugs (including prescription drugs).  those with fair skin should take precautions against the sun. blood tests are being requested for you today.  You will receive a letter with results. Refer to an orthopedic specialist.  you will receive a phone call, about a day and time for an appointment.   here are some tests for blood in the bowels.  please follow the instructions, and return to the lab downstairs.   Here are prescriptions for skin cream and oxycodone.   Please return in 1 year.

## 2011-07-24 NOTE — Progress Notes (Signed)
Subjective:    Patient ID: Savannah [name], female    DOB: 03-01-1955, 56 y.o.   MRN: [id number]  HPI here for regular wellness examination.  she's feeling pretty well in general, and says chronic med probs are stable, except as noted below.   Past Medical History  Diagnosis Date  . Unspecified hypothyroidism 01/07/2007  . 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 HYPERCHOLESTEROLEMIA 04/18/2009  . HYPOKALEMIA 01/15/2007  . ANEMIA, IRON DEFICIENCY 12/06/2008  . OBSTRUCTIVE SLEEP APNEA 08/09/2009  . HYPERTENSION 09/27/2006  . ALLERGIC RHINITIS CAUSE UNSPECIFIED 12/29/2007  . ASTHMA 09/27/2006  . FATTY LIVER DISEASE 03/19/2008  . URINARY CALCULUS 11/08/2009  . 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 BACK PAIN, LUMBAR 12/29/2009  . Palpitations 05/23/2009  . ASYMPTOMATIC POSTMENOPAUSAL STATUS 03/19/2008  . Chronic kidney disease     Past Surgical History  Procedure Date  . Knee arthroscopy 1993  . Hernia repair   . Cardiac catheterization 2011    History   Social History  . Marital Status: Married    Spouse Name: N/A    Number of Children: 2  . Years of Education: N/A   Occupational History  . Homemaker    Social History Main Topics  . Smoking status: Former Smoker -- 0.3 packs/day for 2 years    Types: Cigarettes    Quit date: 02/27/1975  . Smokeless tobacco: Not on file  . Alcohol Use: No  . Drug Use: No  . Sexually Active: Not on file   Other Topics Concern  . Not on file   Social History Narrative   Pt has completed training as a Energy manager    Current Outpatient Prescriptions on File Prior to Visit  Medication Sig Dispense Refill  . albuterol (VENTOLIN HFA) 108 (90 BASE) MCG/ACT inhaler 2 puffs up to four times daily as needed for wheezing       . atorvastatin (LIPITOR) 10 MG tablet Take 1 tablet (10 mg total) by mouth daily.  90 tablet  3  . citalopram (CELEXA) 40 MG tablet Take 1 tablet (40 mg total) by mouth daily.  90 tablet  3  . clotrimazole-betamethasone (LOTRISONE) cream  Three times a day as needed for foot cracking       . diclofenac (VOLTAREN) 75 MG EC tablet TAKE ONE TABLET BY MOUTH TWICE DAILY  180 tablet  2  . glucose blood (ONE TOUCH TEST STRIPS) test strip Once daily dx 250.00       . levothyroxine (SYNTHROID, LEVOTHROID) 200 MCG tablet TAKE ONE TABLET BY MOUTH EVERY DAY  30 tablet  5  . losartan-hydrochlorothiazide (HYZAAR) 100-12.5 MG per tablet Take 1 tablet by mouth daily.  90 tablet  3  . meclizine (ANTIVERT) 12.5 MG tablet 1-2 by mouth three times a day as needed       . metFORMIN (GLUCOPHAGE-XR) 500 MG 24 hr tablet TAKE TWO TABLETS BY MOUTH TWICE DAILY  120 tablet  5  . metoprolol succinate (TOPROL-XL) 25 MG 24 hr tablet Take 1 tablet (25 mg total) by mouth daily.  90 tablet  3  . Oxycodone HCl 10 MG TABS Take 1 tablet (10 mg total) by mouth every 4 (four) hours as needed. For pain  100 each  0  . pioglitazone (ACTOS) 15 MG tablet Take 1 tablet (15 mg total) by mouth daily.  90 tablet  3  . Potassium Citrate (UROCIT-K 15) 15 MEQ (1620 MG) TBCR Take 2 tablets by mouth 2 (two) times daily.  180 tablet  3  . traZODone (DESYREL) 100 MG tablet TAKE ONE TABLET BY MOUTH AT BEDTIME  30 tablet  2    Allergies  Allergen Reactions  . Erythromycin     REACTION: nausea/vomiting    Family History  Problem Relation Age of Onset  . Cancer Mother     Colon Cancer  . Heart attack Father   . Heart disease Father   . Allergies Sister   . Asthma Sister   . Heart disease Paternal Grandfather   . Allergies Sister   . Clotting disorder Grandchild     BP 138/72  Pulse 80  Temp(Src) 97.8 F (36.6 C) (Oral)  Ht 5\' 8"  (1.727 m)  Wt 240 lb (108.863 kg)  BMI 36.49 kg/m2  SpO2 96%  Review of Systems  Constitutional: Negative for fever.  HENT: Negative for hearing loss.   Eyes: Negative for visual disturbance.  Respiratory: Negative for shortness of breath.   Gastrointestinal: Negative for anal bleeding.  Genitourinary: Negative for hematuria.    Musculoskeletal: Positive for back pain.  Skin: Negative for rash.  Neurological: Negative for headaches.       Intermittent vertigo  Psychiatric/Behavioral: Positive for dysphoric mood.       Objective:   Physical Exam VS: see vs page GEN: no distress HEAD: head: no deformity eyes: no periorbital swelling, no proptosis external nose and ears are normal mouth: no lesion seen NECK: supple, thyroid is not enlarged CHEST WALL: no deformity LUNGS:  Clear to auscultation BREASTS:  sees gyn CV: reg rate and rhythm, no murmur ABD: abdomen is soft, nontender.  no hepatosplenomegaly.  not distended.  no hernia GENITALIA/RECTAL: sees gyn MUSCULOSKELETAL: muscle bulk and strength are grossly normal.  no obvious joint swelling.  gait is normal and steady EXTEMITIES: no deformity.  no ulcer on the feet.  feet are of normal color and temp.  Trace bilat leg edema.  There is bilateral onychomycosis PULSES: dorsalis pedis intact bilat.  no carotid bruit NEURO:  cn 2-12 grossly intact.   readily moves all 4's.  sensation is intact to touch on the feet, but decreased from normal SKIN:  Normal texture and temperature.  No rash or suspicious lesion is visible.   NODES:  None palpable at the neck.   PSYCH: alert, oriented x3.  Does not appear anxious nor depressed.    Assessment & Plan:  Wellness visit today, with problems stable, except as noted.   SEPARATE EVALUATION FOLLOWS--EACH PROBLEM HERE IS NEW, NOT RESPONDING TO TREATMENT, OR POSES SIGNIFICANT RISK TO THE PATIENT'S HEALTH: HISTORY OF THE PRESENT ILLNESS: Low-back pain is worse.  She received steroid shot in the past, and it helped.   Anemia persists.  She denies syncope. Dyslipidemia: denies chest pain PAST MEDICAL HISTORY reviewed and up to date today REVIEW OF SYSTEMS: Denies weight change.  She has easy bruising. PHYSICAL EXAMINATION: VITAL SIGNS:  See vs page GENERAL: no distress Spine: nontender LAB/XRAY RESULTS: Lab  Results  Component Value Date   WBC 4.9 07/24/2011   HGB 11.7* 07/24/2011   HCT 34.9* 07/24/2011   PLT 133.0* 07/24/2011   GLUCOSE 132* 07/24/2011   CHOL 183 07/24/2011   TRIG 205.0* 07/24/2011   HDL 49.10 07/24/2011   LDLDIRECT 115.7 07/24/2011   LDLCALC 45 05/09/2010   ALT 52* 07/24/2011   AST 42* 07/24/2011   NA 139 07/24/2011   K 4.7 07/24/2011   CL 106 07/24/2011   CREATININE 1.0 07/24/2011   BUN 21 07/24/2011   CO2  25 07/24/2011   TSH 0.51 07/24/2011   INR 1.1 ratio* 05/23/2009   HGBA1C 6.6* 07/24/2011   MICROALBUR 1.5 07/24/2011  IMPRESSION: Anemia, persistent.  uncertain etiology Low-back pain, recurrent Dyslipidemia, needs increased rx PLAN: See instruction page

## 2011-07-25 ENCOUNTER — Telehealth: Payer: Self-pay | Admitting: *Deleted

## 2011-07-25 LAB — PTH, INTACT AND CALCIUM: PTH: 56.4 pg/mL (ref 14.0–72.0)

## 2011-07-25 NOTE — Telephone Encounter (Signed)
Called pt to inform of lab results, pt informed. Pt states that she is taking 1 Iron pill daily. She also wants increased Lipitor rx sent to [facility] mail order.

## 2011-07-26 MED ORDER — ATORVASTATIN CALCIUM 40 MG PO TABS: 40.0000 mg | ORAL_TABLET | Freq: Every day | ORAL | Status: DC

## 2011-07-26 NOTE — Telephone Encounter (Signed)
Pt informed

## 2011-07-26 NOTE — Telephone Encounter (Signed)
Left message for pt to callback office.  

## 2011-07-26 NOTE — Telephone Encounter (Signed)
i have sent a prescription to your pharmacy, for the increased lipitor Please continue the same fe tabs

## 2011-08-21 ENCOUNTER — Telehealth: Payer: Self-pay | Admitting: Endocrinology

## 2011-08-21 MED ORDER — OXYCODONE HCL 10 MG PO TABS: 10.0000 mg | ORAL_TABLET | ORAL | Status: DC | PRN

## 2011-08-21 NOTE — Telephone Encounter (Signed)
Dr Everardo All is PCP - to forward request

## 2011-08-21 NOTE — Telephone Encounter (Signed)
i printed 

## 2011-08-21 NOTE — Telephone Encounter (Signed)
Patient requesting refill for Oxycodone and would like to come pick it up when ready.

## 2011-08-22 NOTE — Telephone Encounter (Signed)
Informed pt rx ready for pickup via VM, rx placed upfront in cabinet.

## 2011-09-06 ENCOUNTER — Other Ambulatory Visit: Payer: Self-pay | Admitting: Endocrinology

## 2011-09-23 ENCOUNTER — Other Ambulatory Visit: Payer: Self-pay | Admitting: Endocrinology

## 2011-10-05 ENCOUNTER — Telehealth: Payer: Self-pay | Admitting: Endocrinology

## 2011-10-05 MED ORDER — OXYCODONE HCL 10 MG PO TABS: 10.0000 mg | ORAL_TABLET | ORAL | Status: DC | PRN

## 2011-10-05 NOTE — Telephone Encounter (Signed)
Pt states that she did see ortho MD and that he gave her a cortisone injection in her back, but it did not work as well this time.

## 2011-10-05 NOTE — Telephone Encounter (Signed)
Did pt go to ortho?

## 2011-10-05 NOTE — Telephone Encounter (Signed)
i printed 

## 2011-10-05 NOTE — Telephone Encounter (Signed)
Patient calling that she needs a refill for her Oxycodone 10 mg take 1 by mouth every 4 hours as needed.  EPIC chart shows last refill 08/21/11 and she took her last pill this morning, 10/05/11.   Please call her when the prescription is ready to be picked up.  Her number is [phone].

## 2011-10-05 NOTE — Telephone Encounter (Signed)
Pt informed rx ready for pickup, rx placed upfront in cabinet. 

## 2011-10-26 ENCOUNTER — Encounter: Payer: Self-pay | Admitting: Endocrinology

## 2011-10-26 ENCOUNTER — Ambulatory Visit (INDEPENDENT_AMBULATORY_CARE_PROVIDER_SITE_OTHER): Payer: BC Managed Care – PPO | Admitting: Endocrinology

## 2011-10-26 VITALS — BP 130/78 | HR 106 | Temp 99.0°F | Ht 68.0 in | Wt 240.0 lb

## 2011-10-26 DIAGNOSIS — E119 Type 2 diabetes mellitus without complications: Secondary | ICD-10-CM

## 2011-10-26 DIAGNOSIS — J069 Acute upper respiratory infection, unspecified: Secondary | ICD-10-CM

## 2011-10-26 MED ORDER — PROMETHAZINE-CODEINE 6.25-10 MG/5ML PO SYRP: 5.0000 mL | ORAL_SOLUTION | ORAL | Status: AC | PRN

## 2011-10-26 MED ORDER — CEFUROXIME AXETIL 250 MG PO TABS: 250.0000 mg | ORAL_TABLET | Freq: Two times a day (BID) | ORAL | Status: AC

## 2011-10-26 NOTE — Patient Instructions (Addendum)
i have sent a prescription: antibiotic and cough syrup. Refer to a dietician specialist.  you will receive a phone call, about a day and time for an appointment Loratadine-d (non-prescription) will help your congestion. I hope you feel better soon.  If you don't feel better by next week, please call back.  Please call sooner if you get worse.

## 2011-10-26 NOTE — Progress Notes (Signed)
Subjective:    Patient ID: Savannah [name], female    DOB: 06/22/1955, 56 y.o.   MRN: [id number]  HPI Pt states 2 weeks of moderate pain at both ears, and assoc prod cough. Past Medical History  Diagnosis Date  . Unspecified hypothyroidism 01/07/2007  . 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 HYPERCHOLESTEROLEMIA 04/18/2009  . HYPOKALEMIA 01/15/2007  . ANEMIA, IRON DEFICIENCY 12/06/2008  . OBSTRUCTIVE SLEEP APNEA 08/09/2009  . HYPERTENSION 09/27/2006  . ALLERGIC RHINITIS CAUSE UNSPECIFIED 12/29/2007  . ASTHMA 09/27/2006  . FATTY LIVER DISEASE 03/19/2008  . URINARY CALCULUS 11/08/2009  . 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 BACK PAIN, LUMBAR 12/29/2009  . Palpitations 05/23/2009  . ASYMPTOMATIC POSTMENOPAUSAL STATUS 03/19/2008  . Chronic kidney disease     Past Surgical History  Procedure Date  . Knee arthroscopy 1993  . Hernia repair   . Cardiac catheterization 2011    History   Social History  . Marital Status: Married    Spouse Name: N/A    Number of Children: 2  . Years of Education: N/A   Occupational History  . Homemaker    Social History Main Topics  . Smoking status: Former Smoker -- 0.3 packs/day for 2 years    Types: Cigarettes    Quit date: 02/27/1975  . Smokeless tobacco: Not on file  . Alcohol Use: No  . Drug Use: No  . Sexually Active: Not on file   Other Topics Concern  . Not on file   Social History Narrative   Pt has completed training as a Energy manager    Current Outpatient Prescriptions on File Prior to Visit  Medication Sig Dispense Refill  . albuterol (VENTOLIN HFA) 108 (90 BASE) MCG/ACT inhaler 2 puffs up to four times daily as needed for wheezing       . atorvastatin (LIPITOR) 40 MG tablet Take 1 tablet (40 mg total) by mouth daily.  90 tablet  3  . citalopram (CELEXA) 40 MG tablet TAKE ONE TABLET BY MOUTH EVERY DAY  90 tablet  2  . clotrimazole-betamethasone (LOTRISONE) cream Three times a day as needed for foot cracking       . diclofenac (VOLTAREN) 75  MG EC tablet TAKE ONE TABLET BY MOUTH TWICE DAILY  180 tablet  2  . gabapentin (NEURONTIN) 300 MG capsule Take 300 mg by mouth at bedtime.      Marland Kitchen glucose blood (ONE TOUCH TEST STRIPS) test strip Once daily dx 250.00       . levothyroxine (SYNTHROID, LEVOTHROID) 200 MCG tablet TAKE ONE TABLET BY MOUTH EVERY DAY  30 tablet  5  . losartan-hydrochlorothiazide (HYZAAR) 100-12.5 MG per tablet Take 1 tablet by mouth daily.  90 tablet  3  . meclizine (ANTIVERT) 12.5 MG tablet Take 1 tablet (12.5 mg total) by mouth 3 (three) times daily as needed for dizziness. 1-2 by mouth three times a day as needed  50 tablet  3  . metFORMIN (GLUCOPHAGE-XR) 500 MG 24 hr tablet TAKE TWO TABLETS BY MOUTH TWICE DAILY  120 tablet  9  . metoprolol succinate (TOPROL-XL) 25 MG 24 hr tablet Take 1 tablet (25 mg total) by mouth daily.  90 tablet  3  . Oxycodone HCl 10 MG TABS Take 1 tablet (10 mg total) by mouth every 4 (four) hours as needed. For pain  120 each  0  . pioglitazone (ACTOS) 15 MG tablet Take 1 tablet (15 mg total) by mouth daily.  90 tablet  3  . Potassium  Citrate (UROCIT-K 15) 15 MEQ (1620 MG) TBCR Take 2 tablets by mouth 2 (two) times daily.  180 tablet  3  . traZODone (DESYREL) 100 MG tablet TAKE ONE TABLET BY MOUTH EVERY DAY AT BEDTIME  30 tablet  3  . triamcinolone cream (KENALOG) 0.1 % Apply topically 3 (three) times daily as needed. for rash  45 g  3    Allergies  Allergen Reactions  . Erythromycin     REACTION: nausea/vomiting    Family History  Problem Relation Age of Onset  . Cancer Mother     Colon Cancer  . Heart attack Father   . Heart disease Father   . Allergies Sister   . Asthma Sister   . Heart disease Paternal Grandfather   . Allergies Sister   . Clotting disorder Grandchild     BP 130/78  Pulse 106  Temp 99 F (37.2 C) (Oral)  Ht 5\' 8"  (1.727 m)  Wt 240 lb (108.863 kg)  BMI 36.49 kg/m2  SpO2 95%   Review of Systems She has nasal congestion and low-grade fever.  No sob  or wheezing.    Objective:   Physical Exam VITAL SIGNS:  See vs page GENERAL: no distress head: no deformity eyes: no periorbital swelling, no proptosis external nose and ears are normal mouth: no lesion seen. Both tm's are very red.      Assessment & Plan:  URI, new

## 2011-11-02 ENCOUNTER — Ambulatory Visit (INDEPENDENT_AMBULATORY_CARE_PROVIDER_SITE_OTHER): Payer: BC Managed Care – PPO | Admitting: Physician Assistant

## 2011-11-02 VITALS — BP 130/76 | HR 73 | Temp 98.1°F | Resp 18 | Ht 68.0 in | Wt 239.0 lb

## 2011-11-02 DIAGNOSIS — R350 Frequency of micturition: Secondary | ICD-10-CM

## 2011-11-02 DIAGNOSIS — H6690 Otitis media, unspecified, unspecified ear: Secondary | ICD-10-CM

## 2011-11-02 LAB — POCT UA - MICROSCOPIC ONLY
Crystals, Ur, HPF, POC: NEGATIVE
Mucus, UA: POSITIVE

## 2011-11-02 LAB — POCT URINALYSIS DIPSTICK
Bilirubin, UA: NEGATIVE
Blood, UA: NEGATIVE
Protein, UA: NEGATIVE
Spec Grav, UA: 1.025
pH, UA: 5

## 2011-11-02 MED ORDER — AMOXICILLIN 875 MG PO TABS: 875.0000 mg | ORAL_TABLET | Freq: Two times a day (BID) | ORAL | Status: AC

## 2011-11-02 MED ORDER — CIPROFLOXACIN HCL 500 MG PO TABS: 500.0000 mg | ORAL_TABLET | Freq: Two times a day (BID) | ORAL | Status: AC

## 2011-11-02 NOTE — Progress Notes (Signed)
  Subjective:    Patient ID: Savannah [name], female    DOB: 1955/12/30, 56 y.o.   MRN: [id number]  HPI 56 year old female presents with acute onset of urinary frequency, suprapubic pressure, and some lower abdominal pain.  Has a history of urinary tract infections in the past. Denies fever, chills, nausea, vomiting, or back pain.  Also complains of left ear pain. Says she was treated for a right ear infection by per PCP and just completed antibiotics.  States her left ear now is hurting and has pressure.      Review of Systems  All other systems reviewed and are negative.       Objective:   Physical Exam  Constitutional: She is oriented to person, place, and time. She appears well-developed and well-nourished.  HENT:  Head: Normocephalic and atraumatic.  Right Ear: Hearing, tympanic membrane, external ear and ear canal normal.  Left Ear: Hearing, external ear and ear canal normal. Tympanic membrane is erythematous. A middle ear effusion is present.  Mouth/Throat: No oropharyngeal exudate.  Eyes: Conjunctivae are normal.  Cardiovascular: Normal rate, regular rhythm and normal heart sounds.   Pulmonary/Chest: Effort normal and breath sounds normal.  Abdominal: Soft. Bowel sounds are normal. There is tenderness (suprapubic tenderness, no CVA tenderness bilaterally).  Neurological: She is alert and oriented to person, place, and time.  Psychiatric: She has a normal mood and affect. Her behavior is normal. Judgment and thought content normal.          Assessment & Plan:   1. Urinary frequency  Cipro 500 mg bid x 5 days Urine culture sent Increase fluids Follow up if symptoms worsen or fail to improve.  POCT UA - Microscopic Only, POCT urinalysis dipstick, ciprofloxacin (CIPRO) 500 MG tablet, Urine culture  2. Otitis media  Start amoxicillin 875 mg  Take OTC Zyrtec to help with effusion amoxicillin (AMOXIL) 875 MG tablet

## 2011-11-04 LAB — URINE CULTURE
Colony Count: NO GROWTH
Organism ID, Bacteria: NO GROWTH

## 2011-11-27 ENCOUNTER — Ambulatory Visit: Payer: BC Managed Care – PPO | Admitting: *Deleted

## 2011-11-27 ENCOUNTER — Telehealth: Payer: Self-pay | Admitting: Endocrinology

## 2011-11-27 NOTE — Telephone Encounter (Signed)
Caller: Fusako/Patient; Patient Name: Savannah [name]; PCP: Romero Belling (Adults only); Best Callback Phone Number: [phone]; Reason for call:Right ear congestion.  On 10/26/11-was seen for Upper Respiratory Infection and was given antibiotic with cough medication. About 1 week afterward, patient felt she had a urinary tract infection but could not get in to see Dr. Rennis Harding.  Went to a walk-in clinic and was diagnosed with a bladder infection and ear infection.  She was placed on an antibiotic for ear and bladder. Medication was completed 1-2 weeks ago.  Currently feels that  left ear is congested and muffled to sound. When she lays down it seems to open up, but when up it is at its worse.  She feels like she can hear her heart beat.  Denies pain or emergent Symptoms.  Triaged using Ear: symptoms with a disposition for home care due to ear fullness/pressure along with symptoms of a cold/upper respiratory infection or diagnosed seasonal allergies.  Care advice given.  Caller demonstrated understanding.  Patient is  requesting refill of Oxycodone HCl 10 MG TABS;  Dose: 10 mg Route: Oral Frequency: Every 4 hours PRN.  Dr. Everardo All is the original prescriber, her last office visit was 10/26/11.  She has (1) pill left.   [facility] on [address] is pharmacy of choice at [phone].  OFFICE: PLEASE FOLLOW UP ON OXYCODONE REQUEST WITH PATIENT.  THANKS

## 2011-11-27 NOTE — Telephone Encounter (Signed)
Pt states she does not have funding for an office visit copay. Please advise.

## 2011-11-27 NOTE — Telephone Encounter (Signed)
Please advise ov tomorrow 

## 2011-11-28 NOTE — Telephone Encounter (Signed)
i cannot advise until i examine pt

## 2011-11-28 NOTE — Telephone Encounter (Signed)
Pt notified stated that she would just have to make an appt when funds became available.

## 2011-12-10 ENCOUNTER — Other Ambulatory Visit: Payer: Self-pay | Admitting: Endocrinology

## 2011-12-13 ENCOUNTER — Ambulatory Visit: Payer: BC Managed Care – PPO | Admitting: *Deleted

## 2011-12-28 ENCOUNTER — Other Ambulatory Visit: Payer: Self-pay

## 2011-12-28 ENCOUNTER — Encounter: Payer: Self-pay | Admitting: Endocrinology

## 2011-12-28 ENCOUNTER — Ambulatory Visit (INDEPENDENT_AMBULATORY_CARE_PROVIDER_SITE_OTHER): Payer: BC Managed Care – PPO | Admitting: Endocrinology

## 2011-12-28 VITALS — BP 126/82 | HR 102 | Temp 98.6°F | Wt 240.0 lb

## 2011-12-28 DIAGNOSIS — Z23 Encounter for immunization: Secondary | ICD-10-CM

## 2011-12-28 DIAGNOSIS — M545 Low back pain: Secondary | ICD-10-CM

## 2011-12-28 MED ORDER — OXYCODONE HCL 10 MG PO TABS: 10.0000 mg | ORAL_TABLET | ORAL | Status: DC | PRN

## 2011-12-28 MED ORDER — TRAZODONE HCL 100 MG PO TABS: 100.0000 mg | ORAL_TABLET | Freq: Every day | ORAL | Status: DC

## 2011-12-28 MED ORDER — CEFUROXIME AXETIL 250 MG PO TABS: 250.0000 mg | ORAL_TABLET | Freq: Two times a day (BID) | ORAL | Status: AC

## 2011-12-28 MED ORDER — LEVOTHYROXINE SODIUM 200 MCG PO TABS: 200.0000 ug | ORAL_TABLET | Freq: Every day | ORAL | Status: DC

## 2011-12-28 NOTE — Patient Instructions (Addendum)
i have sent a prescription to your pharmacy, for an antibiotic pill. Loratadine-d (non-prescription) will help your congestion. Here is a refill of your pain medication. I hope you feel better soon.  If you don't feel better by next week, please dr Ethelene Hal.
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
## 2012-01-07 ENCOUNTER — Ambulatory Visit: Payer: BC Managed Care – PPO | Admitting: *Deleted

## 2012-01-08 ENCOUNTER — Ambulatory Visit: Payer: BC Managed Care – PPO | Admitting: *Deleted

## 2012-01-15 ENCOUNTER — Encounter: Payer: Self-pay | Admitting: Endocrinology

## 2012-01-15 ENCOUNTER — Ambulatory Visit (INDEPENDENT_AMBULATORY_CARE_PROVIDER_SITE_OTHER): Payer: BC Managed Care – PPO | Admitting: Endocrinology

## 2012-01-15 ENCOUNTER — Other Ambulatory Visit: Payer: Self-pay | Admitting: *Deleted

## 2012-01-15 VITALS — BP 134/78 | HR 74 | Temp 98.8°F | Wt 240.0 lb

## 2012-01-15 DIAGNOSIS — R1013 Epigastric pain: Secondary | ICD-10-CM

## 2012-01-15 LAB — CBC WITH DIFFERENTIAL/PLATELET
Hemoglobin: 11.3 g/dL — ABNORMAL LOW (ref 12.0–15.0)
Lymphs Abs: 2.7 10*3/uL (ref 0.7–4.0)
MCH: 29.5 pg (ref 26.0–34.0)
Monocytes Relative: 3 % (ref 3–12)
Neutro Abs: 4.4 10*3/uL (ref 1.7–7.7)
Neutrophils Relative %: 40 % — ABNORMAL LOW (ref 43–77)
RBC: 3.83 MIL/uL — ABNORMAL LOW (ref 3.87–5.11)

## 2012-01-15 LAB — HEPATIC FUNCTION PANEL
AST: 20 U/L (ref 0–37)
Alkaline Phosphatase: 64 U/L (ref 39–117)
Bilirubin, Direct: 0.2 mg/dL (ref 0.0–0.3)
Indirect Bilirubin: 0.9 mg/dL (ref 0.0–0.9)
Total Bilirubin: 1.1 mg/dL (ref 0.3–1.2)

## 2012-01-15 MED ORDER — ONDANSETRON HCL 4 MG PO TABS: 4.0000 mg | ORAL_TABLET | Freq: Four times a day (QID) | ORAL | Status: DC | PRN

## 2012-01-15 MED ORDER — OMEPRAZOLE 40 MG PO CPDR: 40.0000 mg | DELAYED_RELEASE_CAPSULE | Freq: Every day | ORAL | Status: DC

## 2012-01-15 NOTE — Progress Notes (Signed)
Subjective:    Patient ID: Savannah [name], female    DOB: 12/26/1955, 56 y.o.   MRN: [id number]  HPI Pt states 2 weeks of moderate pain at the epigastric area, but no assoc vomiting.  She has nausea.  sxs are not affected by eating.   She denies brbpr and hematuria.  Past Medical History  Diagnosis Date  . Unspecified hypothyroidism 01/07/2007  . 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 HYPERCHOLESTEROLEMIA 04/18/2009  . HYPOKALEMIA 01/15/2007  . ANEMIA, IRON DEFICIENCY 12/06/2008  . OBSTRUCTIVE SLEEP APNEA 08/09/2009  . HYPERTENSION 09/27/2006  . ALLERGIC RHINITIS CAUSE UNSPECIFIED 12/29/2007  . ASTHMA 09/27/2006  . FATTY LIVER DISEASE 03/19/2008  . URINARY CALCULUS 11/08/2009  . 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 BACK PAIN, LUMBAR 12/29/2009  . Palpitations 05/23/2009  . ASYMPTOMATIC POSTMENOPAUSAL STATUS 03/19/2008  . Chronic kidney disease     Past Surgical History  Procedure Date  . Knee arthroscopy 1993  . Hernia repair   . Cardiac catheterization 2011    History   Social History  . Marital Status: Married    Spouse Name: N/A    Number of Children: 2  . Years of Education: N/A   Occupational History  . Homemaker    Social History Main Topics  . Smoking status: Former Smoker -- 0.3 packs/day for 2 years    Types: Cigarettes    Quit date: 02/27/1975  . Smokeless tobacco: Not on file  . Alcohol Use: No  . Drug Use: No  . Sexually Active: Not on file   Other Topics Concern  . Not on file   Social History Narrative   Pt has completed training as a Energy manager    Current Outpatient Prescriptions on File Prior to Visit  Medication Sig Dispense Refill  . albuterol (VENTOLIN HFA) 108 (90 BASE) MCG/ACT inhaler 2 puffs up to four times daily as needed for wheezing       . atorvastatin (LIPITOR) 40 MG tablet Take 1 tablet (40 mg total) by mouth daily.  90 tablet  3  . citalopram (CELEXA) 40 MG tablet TAKE ONE TABLET BY MOUTH EVERY DAY  90 tablet  2  . clotrimazole-betamethasone  (LOTRISONE) cream Three times a day as needed for foot cracking       . diclofenac (VOLTAREN) 75 MG EC tablet TAKE ONE TABLET BY MOUTH TWICE DAILY  180 tablet  1  . gabapentin (NEURONTIN) 300 MG capsule Take 300 mg by mouth at bedtime.      Marland Kitchen glucose blood (ONE TOUCH TEST STRIPS) test strip Once daily dx 250.00       . levothyroxine (SYNTHROID, LEVOTHROID) 200 MCG tablet Take 1 tablet (200 mcg total) by mouth daily.  90 tablet  6  . losartan-hydrochlorothiazide (HYZAAR) 100-12.5 MG per tablet Take 1 tablet by mouth daily.  90 tablet  3  . meclizine (ANTIVERT) 12.5 MG tablet Take 1 tablet (12.5 mg total) by mouth 3 (three) times daily as needed for dizziness. 1-2 by mouth three times a day as needed  50 tablet  3  . metFORMIN (GLUCOPHAGE-XR) 500 MG 24 hr tablet TAKE TWO TABLETS BY MOUTH TWICE DAILY  120 tablet  9  . metoprolol succinate (TOPROL-XL) 25 MG 24 hr tablet Take 1 tablet (25 mg total) by mouth daily.  90 tablet  3  . Oxycodone HCl 10 MG TABS Take 1 tablet (10 mg total) by mouth every 4 (four) hours as needed. For pain  120 each  0  .  pioglitazone (ACTOS) 15 MG tablet Take 1 tablet (15 mg total) by mouth daily.  90 tablet  3  . Potassium Citrate (UROCIT-K 15) 15 MEQ (1620 MG) TBCR Take 2 tablets by mouth 2 (two) times daily.  180 tablet  3  . traZODone (DESYREL) 100 MG tablet Take 1 tablet (100 mg total) by mouth at bedtime.  90 tablet  6  . triamcinolone cream (KENALOG) 0.1 % Apply topically 3 (three) times daily as needed. for rash  45 g  3  . omeprazole (PRILOSEC) 40 MG capsule Take 1 capsule (40 mg total) by mouth daily.  30 capsule  1    Allergies  Allergen Reactions  . Erythromycin     REACTION: nausea/vomiting    Family History  Problem Relation Age of Onset  . Cancer Mother     Colon Cancer  . Heart attack Father   . Heart disease Father   . Allergies Sister   . Asthma Sister   . Heart disease Paternal Grandfather   . Allergies Sister   . Clotting disorder Grandchild      BP 134/78  Pulse 74  Temp 98.8 F (37.1 C) (Oral)  Wt 240 lb (108.863 kg)  SpO2 98%   Review of Systems Denies fever and diarrhea.      Objective:   Physical Exam VITAL SIGNS:  See vs page GENERAL: no distress ABDOMEN: abdomen is soft, nontender.  no hepatosplenomegaly.  not distended.  no hernia   Lab Results  Component Value Date   WBC 10.9* 01/15/2012   HGB 11.3* 01/15/2012   HCT 33.5* 01/15/2012   PLT 173 01/15/2012   GLUCOSE 132* 07/24/2011   CHOL 183 07/24/2011   TRIG 205.0* 07/24/2011   HDL 49.10 07/24/2011   LDLDIRECT 115.7 07/24/2011   LDLCALC 45 05/09/2010   ALT 18 01/15/2012   AST 20 01/15/2012   NA 139 07/24/2011   K 4.7 07/24/2011   CL 106 07/24/2011   CREATININE 1.0 07/24/2011   BUN 21 07/24/2011   CO2 25 07/24/2011   TSH 0.51 07/24/2011   INR 1.1 ratio* 05/23/2009   HGBA1C 6.6* 07/24/2011   MICROALBUR 1.5 07/24/2011      Assessment & Plan:  abd pain, new, uncertain etiology Depression.  There is an interaction between celexa and prilosec Anemia, unchanged

## 2012-01-15 NOTE — Patient Instructions (Addendum)
Let's check an ultrasound.  you will receive a phone call, about a day and time for an appointment. blood and urine tests are being requested for you today.  We'll contact you with results. i have sent 2 prescriptions to your pharmacy: for the stomach and nausea. While on "omeprazole," take just 1/2 of your citalopram. I hope you feel better soon.  If you don't feel better by next week, please call back.  Please call sooner if you get worse.

## 2012-01-15 NOTE — Telephone Encounter (Signed)
PRIOR AUTHORIZATION APPROVED FOR MEDICATION OMEPRAZOLE 40MG  CAPSULE. [facility] NOTIFIED

## 2012-01-16 ENCOUNTER — Telehealth: Payer: Self-pay | Admitting: *Deleted

## 2012-01-16 LAB — URINALYSIS, ROUTINE W REFLEX MICROSCOPIC
Glucose, UA: NEGATIVE mg/dL
Ketones, ur: NEGATIVE mg/dL
Nitrite: NEGATIVE
Specific Gravity, Urine: 1.018 (ref 1.005–1.030)
pH: 5.5 (ref 5.0–8.0)

## 2012-01-16 LAB — URINALYSIS, MICROSCOPIC ONLY: Casts: NONE SEEN

## 2012-01-16 NOTE — Telephone Encounter (Signed)
[place] APPROVAL FOR PATIENT MEDICATION OMEPRAZOLE FAXED TO PHARMACY FROM [facility].

## 2012-01-23 ENCOUNTER — Ambulatory Visit
Admission: RE | Admit: 2012-01-23 | Discharge: 2012-01-23 | Disposition: A | Discharge status: Home / Self Care | Payer: BC Managed Care – PPO | Source: Ambulatory Visit | Attending: Endocrinology | Admitting: Endocrinology

## 2012-01-23 DIAGNOSIS — R1013 Epigastric pain: Secondary | ICD-10-CM

## 2012-02-07 ENCOUNTER — Ambulatory Visit: Payer: Self-pay | Admitting: *Deleted

## 2012-02-13 ENCOUNTER — Encounter: Payer: Self-pay | Admitting: *Deleted

## 2012-02-13 ENCOUNTER — Encounter: Payer: BC Managed Care – PPO | Attending: Endocrinology | Admitting: *Deleted

## 2012-02-13 VITALS — Ht 68.0 in | Wt 236.6 lb

## 2012-02-13 DIAGNOSIS — E119 Type 2 diabetes mellitus without complications: Secondary | ICD-10-CM | POA: Insufficient documentation

## 2012-02-13 DIAGNOSIS — Z713 Dietary counseling and surveillance: Secondary | ICD-10-CM | POA: Insufficient documentation

## 2012-02-13 NOTE — Patient Instructions (Addendum)
Goals:  Follow Diabetes Meal Plan as instructed  Eat 3 meals and 2 snacks, or every 3-5 hrs - AVOID meal skipping  Limit carbohydrate intake to 45 grams/meal and 0-15 grams/snack  Add lean protein foods to all meals/snacks  Avoid sugar sweetened beverages and concentrated sweets  Avoid high fat foods (i.e. Fast food, fried foods, etc)  Monitor glucose levels as instructed by your doctor  Aim for 30-60 mins of physical activity daily  Bring food record and glucose log to your next nutrition visit

## 2012-02-13 NOTE — Progress Notes (Signed)
  Medical Nutrition Therapy:  Appt start time: 1100  End time:  1200.  Assessment:  T2DM.  Savannah [name] comes today for DM assessment with an A1c of 6.6% (07/24/11). Is not checking BG at this time and no longer taking Actos d/t negative claims in the media. Advised her to alert MD asap. Consumes only 2 meals/day, which consist of high CHO and/or fat intake. Drinks mainly water, though has 12-20 oz of regular soft drinks "sometimes". Claims she has never had DM education, though knows how to count CHO. States she has "just gotten off track". No structured exercise reported at this time.   Lab Results  Component Value Date   HGBA1C 6.6* 07/24/2011    MEDICATIONS: See medication list. Pt has not taken ACTOS in the last 2 weeks d/t reading negative media about it's side effects.    DIETARY INTAKE:  Usual eating pattern includes 2 meals and 0-1 snacks per day.  24-hr recall:  B ( AM): NONE  Snk ( AM): NONE L ( PM): [employer] - new potatoes, meatloaf, and veggies;  Steak & Shake - salad w/ ranch Snk ( PM): Apples, bananas, carrots, OR celery w/ ranch  D ( PM): Steak (3-4 oz), baked potato (6 oz) w/ olive oil & spices Snk ( PM): None Beverages: Water, occasionally regular Coke or Pepsi (12-20 oz)  Usual physical activity: None  Estimated needs: 1400 calories (for weight loss of 1/2 lb per week) 155-160 g carbohydrates (45% of calories)  Progress Towards Goal(s):  In progress.   Nutritional Diagnosis:  [place]-2.1 Inpaired nutrition utilization related to glucose metabolism as evidenced by recent A1c of 6.6% and MD referral for MNT .    Intervention:  Nutrition education including CHO metabolism, CHO counting, meal planning.   Handouts given during visit include:  Living Well with Diabetes - [employer]  [facility] Meal Planning Card  Mr. Idell Pickles Quick & Easy Diabetic Cooking  Meal Planning for People with Diabetes - [employer]i [employer]entis  Samples given during visit include:   VerioIQ Meter Starter Kit:  1 ea Lot # [id number]; Exp: 08/14  VerioIQ Strips: 1 box (10 strips) Lot # [id number]; Exp: 04/14  Monitoring/Evaluation:  Dietary intake, exercise, A1c, BG trends, and body weight in 6 week(s).

## 2012-02-15 ENCOUNTER — Other Ambulatory Visit: Payer: Self-pay

## 2012-02-15 ENCOUNTER — Telehealth: Payer: Self-pay | Admitting: Endocrinology

## 2012-02-15 MED ORDER — CLOTRIMAZOLE-BETAMETHASONE 1-0.05 % EX CREA: TOPICAL_CREAM | Freq: Two times a day (BID) | CUTANEOUS | Status: DC

## 2012-02-15 NOTE — Telephone Encounter (Signed)
Patient needs a refill on Clotrimazole-bethamethasone cream sent to [facility], also she got a new one-touch verio machine and she needs to have strips called in

## 2012-03-18 ENCOUNTER — Encounter: Payer: Self-pay | Admitting: Endocrinology

## 2012-03-18 ENCOUNTER — Ambulatory Visit (INDEPENDENT_AMBULATORY_CARE_PROVIDER_SITE_OTHER): Payer: BC Managed Care – PPO | Admitting: Endocrinology

## 2012-03-18 VITALS — BP 134/80 | HR 96 | Temp 97.8°F | Wt 234.0 lb

## 2012-03-18 DIAGNOSIS — J069 Acute upper respiratory infection, unspecified: Secondary | ICD-10-CM

## 2012-03-18 MED ORDER — CEFUROXIME AXETIL 250 MG PO TABS: 250.0000 mg | ORAL_TABLET | Freq: Two times a day (BID) | ORAL | Status: AC

## 2012-03-18 MED ORDER — OXYCODONE HCL 10 MG PO TABS: 10.0000 mg | ORAL_TABLET | ORAL | Status: DC | PRN

## 2012-03-18 NOTE — Progress Notes (Signed)
Subjective:    Patient ID: Savannah [name], female    DOB: September 19, 1955, 57 y.o.   MRN: [id number]  HPI Pt states few days of moderate pain at the right ear, and assoc dry cough.   Past Medical History  Diagnosis Date  . Unspecified hypothyroidism 01/07/2007  . 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 HYPERCHOLESTEROLEMIA 04/18/2009  . HYPOKALEMIA 01/15/2007  . ANEMIA, IRON DEFICIENCY 12/06/2008  . OBSTRUCTIVE SLEEP APNEA 08/09/2009  . HYPERTENSION 09/27/2006  . ALLERGIC RHINITIS CAUSE UNSPECIFIED 12/29/2007  . ASTHMA 09/27/2006  . FATTY LIVER DISEASE 03/19/2008  . URINARY CALCULUS 11/08/2009  . 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 BACK PAIN, LUMBAR 12/29/2009  . Palpitations 05/23/2009  . ASYMPTOMATIC POSTMENOPAUSAL STATUS 03/19/2008  . Chronic kidney disease     Past Surgical History  Procedure Date  . Knee arthroscopy 1993  . Cardiac catheterization 2011    History   Social History  . Marital Status: Married    Spouse Name: N/A    Number of Children: 2  . Years of Education: N/A   Occupational History  . Homemaker    Social History Main Topics  . Smoking status: Former Smoker -- 0.3 packs/day for 2 years    Types: Cigarettes    Quit date: 02/27/1975  . Smokeless tobacco: Not on file  . Alcohol Use: No  . Drug Use: No  . Sexually Active: Not on file   Other Topics Concern  . Not on file   Social History Narrative   Pt has completed training as a Energy manager    Current Outpatient Prescriptions on File Prior to Visit  Medication Sig Dispense Refill  . albuterol (VENTOLIN HFA) 108 (90 BASE) MCG/ACT inhaler 2 puffs up to four times daily as needed for wheezing       . atorvastatin (LIPITOR) 40 MG tablet Take 1 tablet (40 mg total) by mouth daily.  90 tablet  3  . citalopram (CELEXA) 40 MG tablet TAKE ONE TABLET BY MOUTH EVERY DAY  90 tablet  2  . clotrimazole-betamethasone (LOTRISONE) cream Apply topically 2 (two) times daily. Three times a day as needed for foot cracking  30 g  3    . diclofenac (VOLTAREN) 75 MG EC tablet TAKE ONE TABLET BY MOUTH TWICE DAILY  180 tablet  1  . gabapentin (NEURONTIN) 300 MG capsule Take 300 mg by mouth at bedtime.      Marland Kitchen glucose blood (ONE TOUCH TEST STRIPS) test strip Once daily dx 250.00       . levothyroxine (SYNTHROID, LEVOTHROID) 200 MCG tablet Take 1 tablet (200 mcg total) by mouth daily.  90 tablet  6  . losartan-hydrochlorothiazide (HYZAAR) 100-12.5 MG per tablet Take 1 tablet by mouth daily.  90 tablet  3  . meclizine (ANTIVERT) 12.5 MG tablet Take 1 tablet (12.5 mg total) by mouth 3 (three) times daily as needed for dizziness. 1-2 by mouth three times a day as needed  50 tablet  3  . metFORMIN (GLUCOPHAGE-XR) 500 MG 24 hr tablet TAKE TWO TABLETS BY MOUTH TWICE DAILY  120 tablet  9  . metoprolol succinate (TOPROL-XL) 25 MG 24 hr tablet Take 1 tablet (25 mg total) by mouth daily.  90 tablet  3  . omeprazole (PRILOSEC) 40 MG capsule Take 1 capsule (40 mg total) by mouth daily.  30 capsule  1  . ondansetron (ZOFRAN) 4 MG tablet Take 1 tablet (4 mg total) by mouth every 6 (six) hours as needed for nausea.  30 tablet  1  . pioglitazone (ACTOS) 15 MG tablet Take 1 tablet (15 mg total) by mouth daily.  90 tablet  3  . Potassium Citrate (UROCIT-K 15) 15 MEQ (1620 MG) TBCR Take 2 tablets by mouth 2 (two) times daily.  180 tablet  3  . traZODone (DESYREL) 100 MG tablet Take 1 tablet (100 mg total) by mouth at bedtime.  90 tablet  6  . triamcinolone cream (KENALOG) 0.1 % Apply topically 3 (three) times daily as needed. for rash  45 g  3    Allergies  Allergen Reactions  . Erythromycin     REACTION: nausea/vomiting    Family History  Problem Relation Age of Onset  . Cancer Mother     Colon Cancer  . Heart attack Father   . Heart disease Father   . Allergies Sister   . Asthma Sister   . Heart disease Paternal Grandfather   . Allergies Sister   . Clotting disorder Grandchild     BP 134/80  Pulse 96  Temp 97.8 F (36.6 C) (Oral)   Wt 234 lb (106.142 kg)  SpO2 94%    Review of Systems Denies fever and sob.      Objective:   Physical Exam VITAL SIGNS:  See vs page GENERAL: no distress head: no deformity eyes: no periorbital swelling, no proptosis external nose and ears are normal mouth: no lesion seen Right TM is very red, but the left is only slightly red.     Assessment & Plan:  URI, new

## 2012-03-18 NOTE — Patient Instructions (Addendum)
Here is a refill of your pain medication, which helps your cough also. i have sent a prescription to your pharmacy, for an antibiotic pill. Loratadine-d (non-prescription) will help your congestion. I hope you feel better soon.  If you don't feel better by next week, please call back.  Please call sooner if you get worse.

## 2012-04-04 ENCOUNTER — Other Ambulatory Visit: Payer: Self-pay | Admitting: Endocrinology

## 2012-05-19 ENCOUNTER — Other Ambulatory Visit: Payer: Self-pay

## 2012-05-19 DIAGNOSIS — Z1231 Encounter for screening mammogram for malignant neoplasm of breast: Secondary | ICD-10-CM

## 2012-06-03 ENCOUNTER — Other Ambulatory Visit: Payer: Self-pay | Admitting: Endocrinology

## 2012-06-09 ENCOUNTER — Ambulatory Visit
Admission: RE | Admit: 2012-06-09 | Discharge: 2012-06-09 | Disposition: A | Discharge status: Home / Self Care | Payer: BC Managed Care – PPO | Source: Ambulatory Visit

## 2012-06-09 DIAGNOSIS — Z1231 Encounter for screening mammogram for malignant neoplasm of breast: Secondary | ICD-10-CM

## 2012-06-10 ENCOUNTER — Other Ambulatory Visit: Payer: Self-pay | Admitting: Endocrinology

## 2012-06-10 DIAGNOSIS — R928 Other abnormal and inconclusive findings on diagnostic imaging of breast: Secondary | ICD-10-CM

## 2012-06-17 ENCOUNTER — Other Ambulatory Visit: Payer: Self-pay | Admitting: *Deleted

## 2012-06-17 ENCOUNTER — Other Ambulatory Visit: Payer: Self-pay | Admitting: Endocrinology

## 2012-06-17 MED ORDER — CITALOPRAM HYDROBROMIDE 40 MG PO TABS: 40.0000 mg | ORAL_TABLET | Freq: Every day | ORAL | Status: DC

## 2012-06-23 ENCOUNTER — Ambulatory Visit
Admission: RE | Admit: 2012-06-23 | Discharge: 2012-06-23 | Disposition: A | Discharge status: Home / Self Care | Payer: BC Managed Care – PPO | Source: Ambulatory Visit | Attending: Endocrinology | Admitting: Endocrinology

## 2012-06-23 ENCOUNTER — Other Ambulatory Visit: Payer: Self-pay | Admitting: Endocrinology

## 2012-06-23 DIAGNOSIS — R928 Other abnormal and inconclusive findings on diagnostic imaging of breast: Secondary | ICD-10-CM

## 2012-06-26 ENCOUNTER — Ambulatory Visit
Admission: RE | Admit: 2012-06-26 | Discharge: 2012-06-26 | Disposition: A | Discharge status: Home / Self Care | Payer: BC Managed Care – PPO | Source: Ambulatory Visit | Attending: Endocrinology | Admitting: Endocrinology

## 2012-06-26 ENCOUNTER — Other Ambulatory Visit: Payer: Self-pay | Admitting: Endocrinology

## 2012-06-26 DIAGNOSIS — R928 Other abnormal and inconclusive findings on diagnostic imaging of breast: Secondary | ICD-10-CM

## 2012-07-01 ENCOUNTER — Ambulatory Visit (INDEPENDENT_AMBULATORY_CARE_PROVIDER_SITE_OTHER): Payer: BC Managed Care – PPO | Admitting: Pulmonary Disease

## 2012-07-01 ENCOUNTER — Encounter: Payer: Self-pay | Admitting: Pulmonary Disease

## 2012-07-01 VITALS — BP 122/80 | HR 75 | Temp 96.7°F | Ht 68.0 in | Wt 229.2 lb

## 2012-07-01 DIAGNOSIS — G4733 Obstructive sleep apnea (adult) (pediatric): Secondary | ICD-10-CM

## 2012-07-01 NOTE — Patient Instructions (Addendum)
Will send an order to your equipment provider for new supplies and to clear your sim card. Keep working on weight loss.  You are doing well. followup with me in one year.

## 2012-07-01 NOTE — Assessment & Plan Note (Addendum)
The pt is doing well with cpap, and feels rested in the am's with adequate daytime alertness.  She is overdue for a new mask and supplies, and will send an order to her DME.  She has also lost weight since our last visit, and I have asked her to continue. She will f/u with me in one year if doing well.

## 2012-07-01 NOTE — Progress Notes (Signed)
  Subjective:    Patient ID: Savannah [name], female    DOB: [birth year], 57 y.o.   MRN: [id number]  HPI The patient comes in today for followup of her obstructive sleep apnea.  She is wearing CPAP compliantly, and is having no issues with mask fit or pressure.  She is sleeping well with the device, and reports no significant daytime alertness issues.  Her weight has gone down 10 pounds since the last visit.   Review of Systems  Constitutional: Positive for diaphoresis. Negative for fever and unexpected weight change.  HENT: Positive for congestion, rhinorrhea, sneezing, postnasal drip and sinus pressure. Negative for ear pain, nosebleeds, sore throat, trouble swallowing and dental problem.   Eyes: Negative for redness and itching.  Respiratory: Positive for cough, chest tightness, shortness of breath and wheezing.   Cardiovascular: Positive for chest pain. Negative for palpitations and leg swelling.  Gastrointestinal: Negative for nausea and vomiting.  Genitourinary: Negative for dysuria.  Musculoskeletal: Negative for joint swelling.  Skin: Negative for rash.  Neurological: Positive for dizziness and headaches.       Balance issues--equilibrium is off  Hematological: Does not bruise/bleed easily.  Psychiatric/Behavioral: Negative for dysphoric mood. The patient is not nervous/anxious.        Objective:   Physical Exam Obese female in no acute distress Nose with purulent discharge noted No skin breakdown or pressure necrosis from CPAP mask. Neck without lymphadenopathy or thyromegaly Lower extremities with mild edema, no cyanosis Alert and oriented, does not appear to be sleepy, moves all 4 extremities.       Assessment & Plan:

## 2012-07-04 ENCOUNTER — Ambulatory Visit (INDEPENDENT_AMBULATORY_CARE_PROVIDER_SITE_OTHER): Payer: BC Managed Care – PPO | Admitting: Endocrinology

## 2012-07-04 ENCOUNTER — Encounter: Payer: Self-pay | Admitting: Endocrinology

## 2012-07-04 VITALS — BP 126/80 | HR 87 | Temp 98.8°F | Ht 68.0 in | Wt 228.0 lb

## 2012-07-04 DIAGNOSIS — H6690 Otitis media, unspecified, unspecified ear: Secondary | ICD-10-CM

## 2012-07-04 MED ORDER — PROMETHAZINE-CODEINE 6.25-10 MG/5ML PO SYRP: 5.0000 mL | ORAL_SOLUTION | ORAL | Status: DC | PRN

## 2012-07-04 MED ORDER — OXYCODONE HCL 10 MG PO TABS: 10.0000 mg | ORAL_TABLET | ORAL | Status: DC | PRN

## 2012-07-04 MED ORDER — CEFUROXIME AXETIL 250 MG PO TABS: 250.0000 mg | ORAL_TABLET | Freq: Two times a day (BID) | ORAL | Status: AC

## 2012-07-04 MED ORDER — METFORMIN HCL ER 500 MG PO TB24: 1000.0000 mg | ORAL_TABLET | Freq: Two times a day (BID) | ORAL | Status: DC

## 2012-07-04 NOTE — Patient Instructions (Addendum)
Please come in for a regular physical after 07/23/12. Here are 2 prescriptions: antibiotic and cough syrup.  I hope you feel better soon.  If you don't feel better by the week after next, please call back.  Please call sooner if you get worse.

## 2012-07-04 NOTE — Progress Notes (Signed)
Subjective:    Patient ID: Savannah [name], female    DOB: 30-Jul-1955, 57 y.o.   MRN: [id number]  HPI Pt states 2 weeks of moderate prod-quality cough in the chest, and assoc nasal congestion Past Medical History  Diagnosis Date  . Unspecified hypothyroidism 01/07/2007  . 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 HYPERCHOLESTEROLEMIA 04/18/2009  . HYPOKALEMIA 01/15/2007  . ANEMIA, IRON DEFICIENCY 12/06/2008  . OBSTRUCTIVE SLEEP APNEA 08/09/2009  . HYPERTENSION 09/27/2006  . ALLERGIC RHINITIS CAUSE UNSPECIFIED 12/29/2007  . ASTHMA 09/27/2006  . FATTY LIVER DISEASE 03/19/2008  . URINARY CALCULUS 11/08/2009  . 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 BACK PAIN, LUMBAR 12/29/2009  . Palpitations 05/23/2009  . ASYMPTOMATIC POSTMENOPAUSAL STATUS 03/19/2008  . Chronic kidney disease     Past Surgical History  Procedure Laterality Date  . Knee arthroscopy  1993  . Cardiac catheterization  2011    History   Social History  . Marital Status: Married    Spouse Name: N/A    Number of Children: 2  . Years of Education: N/A   Occupational History  . Homemaker    Social History Main Topics  . Smoking status: Former Smoker -- 0.30 packs/day for 2 years    Types: Cigarettes    Quit date: 02/27/1975  . Smokeless tobacco: Not on file  . Alcohol Use: No  . Drug Use: No  . Sexually Active: Not on file   Other Topics Concern  . Not on file   Social History Narrative   Pt has completed training as a Energy manager    Current Outpatient Prescriptions on File Prior to Visit  Medication Sig Dispense Refill  . albuterol (VENTOLIN HFA) 108 (90 BASE) MCG/ACT inhaler 2 puffs up to four times daily as needed for wheezing       . atorvastatin (LIPITOR) 40 MG tablet Take 1 tablet (40 mg total) by mouth daily.  90 tablet  3  . cholecalciferol (VITAMIN D) 1000 UNITS tablet Take 1,000 Units by mouth 2 (two) times daily.      . citalopram (CELEXA) 40 MG tablet Take 1 tablet (40 mg total) by mouth daily.  90 tablet  2  .  clotrimazole-betamethasone (LOTRISONE) cream Apply topically 2 (two) times daily. Three times a day as needed for foot cracking  30 g  3  . diclofenac (VOLTAREN) 75 MG EC tablet TAKE ONE TABLET BY MOUTH TWICE DAILY  180 tablet  0  . ferrous fumarate (HEMOCYTE - 106 MG FE) 325 (106 FE) MG TABS Take 1 tablet by mouth daily.      Marland Kitchen gabapentin (NEURONTIN) 300 MG capsule Take 300 mg by mouth at bedtime.      Marland Kitchen glucose blood (ONE TOUCH TEST STRIPS) test strip Once daily dx 250.00       . levothyroxine (SYNTHROID, LEVOTHROID) 200 MCG tablet Take 1 tablet (200 mcg total) by mouth daily.  90 tablet  6  . losartan-hydrochlorothiazide (HYZAAR) 100-12.5 MG per tablet TAKE 1 TABLET DAILY  90 tablet  3  . meclizine (ANTIVERT) 12.5 MG tablet Take 1 tablet (12.5 mg total) by mouth 3 (three) times daily as needed for dizziness. 1-2 by mouth three times a day as needed  50 tablet  3  . metoprolol succinate (TOPROL-XL) 25 MG 24 hr tablet TAKE 1 TABLET DAILY  90 tablet  3  . omeprazole (PRILOSEC) 40 MG capsule Take 1 capsule (40 mg total) by mouth daily.  30 capsule  1  . ondansetron (ZOFRAN) 4  MG tablet Take 1 tablet (4 mg total) by mouth every 6 (six) hours as needed for nausea.  30 tablet  1  . pioglitazone (ACTOS) 15 MG tablet Take 1 tablet (15 mg total) by mouth daily.  90 tablet  3  . potassium chloride (K-DUR) 10 MEQ tablet Take 20 mEq by mouth 3 (three) times daily.      . traZODone (DESYREL) 100 MG tablet Take 1 tablet (100 mg total) by mouth at bedtime.  90 tablet  6  . triamcinolone cream (KENALOG) 0.1 % Apply topically 3 (three) times daily as needed. for rash  45 g  3   No current facility-administered medications on file prior to visit.    Allergies  Allergen Reactions  . Erythromycin     REACTION: nausea/vomiting    Family History  Problem Relation Age of Onset  . Cancer Mother     Colon Cancer  . Heart attack Father   . Heart disease Father   . Allergies Sister   . Asthma Sister   .  Heart disease Paternal Grandfather   . Allergies Sister   . Clotting disorder Grandchild     BP 126/80  Pulse 87  Temp(Src) 98.8 F (37.1 C) (Oral)  Ht 5\' 8"  (1.727 m)  Wt 228 lb (103.42 kg)  BMI 34.68 kg/m2  SpO2 96%   Review of Systems She reports bilat otalgia, but no fever or wheezing.      Objective:   Physical Exam VITAL SIGNS:  See vs page GENERAL: no distress head: no deformity eyes: no periorbital swelling, no proptosis external nose and ears are normal mouth: no lesion seen Both tm's are red LUNGS:  Clear to auscultation.     Assessment & Plan:  URI, new

## 2012-07-09 ENCOUNTER — Other Ambulatory Visit: Payer: Self-pay | Admitting: Endocrinology

## 2012-07-09 ENCOUNTER — Other Ambulatory Visit: Payer: Self-pay | Admitting: *Deleted

## 2012-07-09 MED ORDER — METFORMIN HCL ER 500 MG PO TB24: 1000.0000 mg | ORAL_TABLET | Freq: Two times a day (BID) | ORAL | Status: DC

## 2012-08-01 ENCOUNTER — Ambulatory Visit (INDEPENDENT_AMBULATORY_CARE_PROVIDER_SITE_OTHER): Payer: BC Managed Care – PPO | Admitting: Endocrinology

## 2012-08-01 ENCOUNTER — Encounter: Payer: Self-pay | Admitting: Endocrinology

## 2012-08-01 VITALS — BP 136/78 | HR 78 | Ht 69.0 in | Wt 223.0 lb

## 2012-08-01 DIAGNOSIS — K7689 Other specified diseases of liver: Secondary | ICD-10-CM

## 2012-08-01 DIAGNOSIS — E7800 Pure hypercholesterolemia, unspecified: Secondary | ICD-10-CM

## 2012-08-01 DIAGNOSIS — E876 Hypokalemia: Secondary | ICD-10-CM

## 2012-08-01 DIAGNOSIS — E119 Type 2 diabetes mellitus without complications: Secondary | ICD-10-CM

## 2012-08-01 DIAGNOSIS — R112 Nausea with vomiting, unspecified: Secondary | ICD-10-CM

## 2012-08-01 DIAGNOSIS — Z0000 Encounter for general adult medical examination without abnormal findings: Secondary | ICD-10-CM

## 2012-08-01 DIAGNOSIS — D509 Iron deficiency anemia, unspecified: Secondary | ICD-10-CM

## 2012-08-01 DIAGNOSIS — E039 Hypothyroidism, unspecified: Secondary | ICD-10-CM

## 2012-08-01 DIAGNOSIS — E559 Vitamin D deficiency, unspecified: Secondary | ICD-10-CM

## 2012-08-01 DIAGNOSIS — E213 Hyperparathyroidism, unspecified: Secondary | ICD-10-CM

## 2012-08-01 DIAGNOSIS — R319 Hematuria, unspecified: Secondary | ICD-10-CM

## 2012-08-01 DIAGNOSIS — I10 Essential (primary) hypertension: Secondary | ICD-10-CM

## 2012-08-01 LAB — HEMOGLOBIN A1C: Hgb A1c MFr Bld: 6.3 % (ref 4.6–6.5)

## 2012-08-01 LAB — BASIC METABOLIC PANEL
BUN: 24 mg/dL — ABNORMAL HIGH (ref 6–23)
CO2: 26 mEq/L (ref 19–32)
Chloride: 103 mEq/L (ref 96–112)
Potassium: 5.4 mEq/L — ABNORMAL HIGH (ref 3.5–5.1)

## 2012-08-01 LAB — TSH: TSH: 0.67 u[IU]/mL (ref 0.35–5.50)

## 2012-08-01 LAB — LIPID PANEL
Cholesterol: 206 mg/dL — ABNORMAL HIGH (ref 0–200)
HDL: 35.8 mg/dL — ABNORMAL LOW (ref >39.00)

## 2012-08-01 LAB — URINALYSIS, ROUTINE W REFLEX MICROSCOPIC
Hgb urine dipstick: NEGATIVE
Nitrite: NEGATIVE
Urobilinogen, UA: 1 (ref 0.0–1.0)
pH: 6 (ref 5.0–8.0)

## 2012-08-01 LAB — CBC WITH DIFFERENTIAL/PLATELET
Basophils Absolute: 0 10*3/uL (ref 0.0–0.1)
HCT: 34.8 % — ABNORMAL LOW (ref 36.0–46.0)
Lymphs Abs: 1.7 10*3/uL (ref 0.7–4.0)
Monocytes Relative: 4.7 % (ref 3.0–12.0)
Platelets: 232 10*3/uL (ref 150.0–400.0)
RDW: 14.2 % (ref 11.5–14.6)

## 2012-08-01 LAB — MICROALBUMIN / CREATININE URINE RATIO
Creatinine,U: 486.7 mg/dL
Microalb Creat Ratio: 1 mg/g (ref 0.0–30.0)

## 2012-08-01 LAB — LDL CHOLESTEROL, DIRECT: Direct LDL: 135.7 mg/dL

## 2012-08-01 LAB — HEPATIC FUNCTION PANEL: Total Bilirubin: 0.8 mg/dL (ref 0.3–1.2)

## 2012-08-01 LAB — IBC PANEL
Saturation Ratios: 41.3 % (ref 20.0–50.0)
Transferrin: 238.6 mg/dL (ref 212.0–360.0)

## 2012-08-01 MED ORDER — ATORVASTATIN CALCIUM 40 MG PO TABS: 40.0000 mg | ORAL_TABLET | Freq: Every day | ORAL | Status: DC

## 2012-08-01 NOTE — Patient Instructions (Addendum)
Try stopping the metformin and diclofenac on a trial basis. Refer to a gastroenterology specialist.  you will receive a phone call, about a day and time for an appointment.   please consider these measures for your health:  minimize alcohol.  do not use tobacco products.  have a colonoscopy at least every 10 years from age 58.  Women should have an annual mammogram from age 69.  keep firearms safely stored.  always use seat belts.  have working smoke alarms in your home.  see an eye doctor and dentist regularly.  never drive under the influence of alcohol or drugs (including prescription drugs).  those with fair skin should take precautions against the sun.   blood tests are being requested for you today.  We'll contact you with results.   Please come back for a follow-up appointment in 6 months.

## 2012-08-01 NOTE — Progress Notes (Signed)
Subjective:    Patient ID: Savannah [name], female    DOB: [birth year], 57 y.o.   MRN: [id number]  HPI Pt is here for regular wellness examination, and is feeling pretty well in general, and says chronic med probs are stable, except as noted below Past Medical History  Diagnosis Date  . Unspecified hypothyroidism 01/07/2007  . 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 HYPERCHOLESTEROLEMIA 04/18/2009  . HYPOKALEMIA 01/15/2007  . ANEMIA, IRON DEFICIENCY 12/06/2008  . OBSTRUCTIVE SLEEP APNEA 08/09/2009  . HYPERTENSION 09/27/2006  . ALLERGIC RHINITIS CAUSE UNSPECIFIED 12/29/2007  . ASTHMA 09/27/2006  . FATTY LIVER DISEASE 03/19/2008  . URINARY CALCULUS 11/08/2009  . 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 BACK PAIN, LUMBAR 12/29/2009  . Palpitations 05/23/2009  . ASYMPTOMATIC POSTMENOPAUSAL STATUS 03/19/2008  . Chronic kidney disease     Past Surgical History  Procedure Laterality Date  . Knee arthroscopy  1993  . Cardiac catheterization  2011    History   Social History  . Marital Status: Married    Spouse Name: N/A    Number of Children: 2  . Years of Education: N/A   Occupational History  . Homemaker    Social History Main Topics  . Smoking status: Former Smoker -- 0.30 packs/day for 2 years    Types: Cigarettes    Quit date: 02/27/1975  . Smokeless tobacco: Not on file  . Alcohol Use: No  . Drug Use: No  . Sexually Active: Not on file   Other Topics Concern  . Not on file   Social History Narrative   Pt has completed training as a Energy manager    Current Outpatient Prescriptions on File Prior to Visit  Medication Sig Dispense Refill  . albuterol (VENTOLIN HFA) 108 (90 BASE) MCG/ACT inhaler 2 puffs up to four times daily as needed for wheezing       . cholecalciferol (VITAMIN D) 1000 UNITS tablet Take 1,000 Units by mouth 2 (two) times daily.      . citalopram (CELEXA) 40 MG tablet Take 1 tablet (40 mg total) by mouth daily.  90 tablet  2  . clotrimazole-betamethasone (LOTRISONE) cream  Apply topically 2 (two) times daily. Three times a day as needed for foot cracking  30 g  3  . ferrous fumarate (HEMOCYTE - 106 MG FE) 325 (106 FE) MG TABS Take 1 tablet by mouth daily.      Marland Kitchen gabapentin (NEURONTIN) 300 MG capsule Take 300 mg by mouth at bedtime.      Marland Kitchen glucose blood (ONE TOUCH TEST STRIPS) test strip Once daily dx 250.00       . levothyroxine (SYNTHROID, LEVOTHROID) 200 MCG tablet Take 1 tablet (200 mcg total) by mouth daily.  90 tablet  6  . losartan-hydrochlorothiazide (HYZAAR) 100-12.5 MG per tablet TAKE 1 TABLET DAILY  90 tablet  3  . meclizine (ANTIVERT) 12.5 MG tablet Take 1 tablet (12.5 mg total) by mouth 3 (three) times daily as needed for dizziness. 1-2 by mouth three times a day as needed  50 tablet  3  . metoprolol succinate (TOPROL-XL) 25 MG 24 hr tablet TAKE 1 TABLET DAILY  90 tablet  3  . omeprazole (PRILOSEC) 40 MG capsule Take 1 capsule (40 mg total) by mouth daily.  30 capsule  1  . ondansetron (ZOFRAN) 4 MG tablet Take 1 tablet (4 mg total) by mouth every 6 (six) hours as needed for nausea.  30 tablet  1  . Oxycodone HCl 10 MG TABS Take  1 tablet (10 mg total) by mouth every 4 (four) hours as needed. For pain  120 each  0  . pioglitazone (ACTOS) 15 MG tablet Take 1 tablet (15 mg total) by mouth daily.  90 tablet  3  . promethazine-codeine (PHENERGAN WITH CODEINE) 6.25-10 MG/5ML syrup Take 5 mLs by mouth every 4 (four) hours as needed for cough.  240 mL  0  . traZODone (DESYREL) 100 MG tablet Take 1 tablet (100 mg total) by mouth at bedtime.  90 tablet  6   No current facility-administered medications on file prior to visit.    Allergies  Allergen Reactions  . Erythromycin     REACTION: nausea/vomiting    Family History  Problem Relation Age of Onset  . Cancer Mother     Colon Cancer  . Heart attack Father   . Heart disease Father   . Allergies Sister   . Asthma Sister   . Heart disease Paternal Grandfather   . Allergies Sister   . Clotting disorder  Grandchild    BP 136/78  Pulse 78  Ht 5\' 9"  (1.753 m)  Wt 223 lb (101.152 kg)  BMI 32.92 kg/m2  SpO2 98%  Review of Systems  Constitutional: Negative for fever.       She has lost a few lbs, due to her efforts  HENT: Negative for hearing loss.   Eyes: Negative for visual disturbance.  Respiratory: Negative for shortness of breath.   Gastrointestinal: Negative for anal bleeding.  Endocrine: Negative for cold intolerance.  Musculoskeletal: Positive for back pain.  Skin: Negative for rash.  Allergic/Immunologic: Positive for environmental allergies.  Hematological: Bruises/bleeds easily.  Psychiatric/Behavioral: Negative for dysphoric mood.      Objective:   Physical Exam VS: see vs page GEN: no distress HEAD: head: no deformity eyes: no periorbital swelling, no proptosis external nose and ears are normal mouth: no lesion seen NECK: supple, thyroid is not enlarged CHEST WALL: no deformity LUNGS:  Clear to auscultation BREASTS:  sees gyn CV: reg rate and rhythm, no murmur GENITALIA/RECTAL: sees gyn MUSCULOSKELETAL: muscle bulk and strength are grossly normal.  no obvious joint swelling.  gait is normal and steady EXTEMITIES: no deformity.  no ulcer on the feet.  feet are of normal color and temp.  no edema PULSES: dorsalis pedis intact bilat.  no carotid bruit NEURO:  cn 2-12 grossly intact.   readily moves all 4's.  sensation is intact to touch on the feet SKIN:  Normal texture and temperature.  No rash or suspicious lesion is visible.   NODES:  None palpable at the neck PSYCH: alert, oriented x3.  Does not appear anxious nor depressed.    Assessment & Plan:  Wellness visit today, with problems stable, except as noted. we discussed code status.  pt requests full code, but would not want to be started or maintained on artificial life-support measures if there was not a reasonable chance of recovery    SEPARATE EVALUATION FOLLOWS--EACH PROBLEM HERE IS NEW, NOT  RESPONDING TO TREATMENT, OR POSES SIGNIFICANT RISK TO THE PATIENT'S HEALTH: HISTORY OF THE PRESENT ILLNESS: The state of at least three ongoing medical problems is addressed today, with interval history of each noted here: Pt was seen a few mos ago for n/v, but pt says it has recurred Dyslipidemia: she has lost a few lbs Anemia: denies hematuria PAST MEDICAL HISTORY reviewed and up to date today REVIEW OF SYSTEMS: Denies chest pain and LOC PHYSICAL EXAMINATION: VITAL SIGNS:  See vs page GENERAL: no distress ABDOMEN: abdomen is soft, nontender.  no hepatosplenomegaly. not distended.  no hernia LAB/XRAY RESULTS: Lab Results  Component Value Date   WBC 6.6 08/01/2012   HGB 11.7* 08/01/2012   HCT 34.8* 08/01/2012   PLT 232.0 08/01/2012   GLUCOSE 96 08/01/2012   CHOL 206* 08/01/2012   TRIG 245.0* 08/01/2012   HDL 35.80* 08/01/2012   LDLDIRECT 135.7 08/01/2012   LDLCALC 45 05/09/2010   ALT 30 08/01/2012   AST 29 08/01/2012   NA 140 08/01/2012   K 5.4* 08/01/2012   CL 103 08/01/2012   CREATININE 1.5* 08/01/2012   BUN 24* 08/01/2012   CO2 26 08/01/2012   TSH 0.67 08/01/2012   INR 1.1 ratio* 05/23/2009   HGBA1C 6.3 08/01/2012   MICROALBUR 5.0 Repeated and verified X2.* 08/01/2012  IMPRESSION: Anemia, uncertain etiology, stable Dyslipidemia, needs increased rx Hypokalemia, overcontrolled. N/v, recurrent, uncertain etiology PLAN: See instruction page

## 2012-08-02 DIAGNOSIS — Z0001 Encounter for general adult medical examination with abnormal findings: Secondary | ICD-10-CM | POA: Insufficient documentation

## 2012-08-02 LAB — VITAMIN D 25 HYDROXY (VIT D DEFICIENCY, FRACTURES): Vit D, 25-Hydroxy: 53 ng/mL (ref 30–89)

## 2012-08-04 LAB — PTH, INTACT AND CALCIUM: Calcium: 9 mg/dL (ref 8.4–10.5)

## 2012-08-05 ENCOUNTER — Encounter: Payer: Self-pay | Admitting: Gastroenterology

## 2012-08-07 ENCOUNTER — Telehealth: Payer: Self-pay | Admitting: Endocrinology

## 2012-08-07 NOTE — Telephone Encounter (Signed)
Pt came in and stated that Dr. Everardo All told her to make an appt with Dr. Posey Rea to get one of the mole under the neck area remove. Mrs. Fortune is doctor Everardo All pt, never see Dr. Macario Golds before. Please advise.

## 2012-08-07 NOTE — Telephone Encounter (Signed)
No problem. Pls schedule skin bx w/me Thx

## 2012-08-07 NOTE — Telephone Encounter (Signed)
Pt has an appt 08/26/12.

## 2012-08-26 ENCOUNTER — Ambulatory Visit (INDEPENDENT_AMBULATORY_CARE_PROVIDER_SITE_OTHER): Payer: Self-pay | Admitting: Internal Medicine

## 2012-08-26 ENCOUNTER — Encounter: Payer: Self-pay | Admitting: Internal Medicine

## 2012-08-26 VITALS — BP 110/70 | HR 80 | Temp 98.2°F | Resp 16 | Wt 222.0 lb

## 2012-08-26 DIAGNOSIS — D485 Neoplasm of uncertain behavior of skin: Secondary | ICD-10-CM

## 2012-08-26 DIAGNOSIS — M25539 Pain in unspecified wrist: Secondary | ICD-10-CM

## 2012-08-26 DIAGNOSIS — D489 Neoplasm of uncertain behavior, unspecified: Secondary | ICD-10-CM

## 2012-08-26 DIAGNOSIS — M25531 Pain in right wrist: Secondary | ICD-10-CM

## 2012-08-26 MED ORDER — DICLOFENAC SODIUM 1 % TD GEL: 2.0000 g | Freq: Four times a day (QID) | TRANSDERMAL | Status: DC

## 2012-08-26 NOTE — Patient Instructions (Signed)
Postprocedure instructions :    A Band-Aid should be  changed twice daily. You can take a shower tomorrow.  Keep the wounds clean. You can wash them with liquid soap and water. Pat dry with gauze or a Kleenex tissue  Before applying antibiotic ointment and a Band-Aid.   You need to report immediately  if fever, chills or any signs of infection develop.    The biopsy results should be available in 1 -2 weeks. 

## 2012-08-26 NOTE — Progress Notes (Signed)
  Subjective:    Patient ID: Savannah [name], female    DOB: 1955-05-11, 57 y.o.   MRN: [id number]  HPI Skin bx C/o R wrist pain and swelling x 2 mo. No injury   Review of Systems  HENT: Negative for neck pain.   Musculoskeletal: Positive for joint swelling. Negative for myalgias.  Skin: Negative for rash and wound.  Neurological: Negative for weakness.       Objective:   Physical Exam  Constitutional: No distress.  Obese  Musculoskeletal: She exhibits edema and tenderness.  R radial wrist is swollen and is very tender along abd pollucis longus  Skin: No rash noted. No erythema.          Assessment & Plan:    Procedure Note :     Procedure :  Skin biopsy   Indication:   Suspicious lesion(s)   Risks including unsuccessful procedure , bleeding, infection, bruising, scar, a need for another complete procedure and others were explained to the patient in detail as well as the benefits. Informed consent was obtained and signed.   The patient was placed in a decubitus position.  Lesion #1 on R neck    measuring 8x6    mm   Skin over lesion #1  was prepped with Betadine and alcohol  and anesthetized with 1 cc of 2% lidocaine and epinephrine, using a 25-gauge 1 inch needle.  Shave biopsy with a sterile Dermablade was carried out in the usual fashion. Hyfrecator was used to destroy the rest of the lesion potentially left behind and for hemostasis. Band-Aid was applied with antibiotic ointment.    Tolerated well. Complications none.

## 2012-08-26 NOTE — Assessment & Plan Note (Addendum)
7/14 abd poll longus tendonitis Voltaren gel ACE We can inject w/steroids if needed F/u w/Dr Everardo All

## 2012-08-26 NOTE — Assessment & Plan Note (Signed)
Skin bx 

## 2012-09-01 ENCOUNTER — Ambulatory Visit (INDEPENDENT_AMBULATORY_CARE_PROVIDER_SITE_OTHER): Payer: BC Managed Care – PPO | Admitting: Gastroenterology

## 2012-09-01 ENCOUNTER — Encounter: Payer: Self-pay | Admitting: Gastroenterology

## 2012-09-01 VITALS — BP 116/70 | HR 84 | Ht 67.5 in | Wt 222.4 lb

## 2012-09-01 DIAGNOSIS — R109 Unspecified abdominal pain: Secondary | ICD-10-CM

## 2012-09-01 DIAGNOSIS — K7689 Other specified diseases of liver: Secondary | ICD-10-CM

## 2012-09-01 DIAGNOSIS — K760 Fatty (change of) liver, not elsewhere classified: Secondary | ICD-10-CM

## 2012-09-01 DIAGNOSIS — R112 Nausea with vomiting, unspecified: Secondary | ICD-10-CM

## 2012-09-01 MED ORDER — HYOSCYAMINE SULFATE 0.125 MG SL SUBL: SUBLINGUAL_TABLET | SUBLINGUAL | Status: DC

## 2012-09-01 NOTE — Progress Notes (Addendum)
History of Present Illness: This is a 57 year old female who relates a 6 month history of abdominal pain, frequent nausea and vomiting. She was placed on Prilosec for one month and her symptoms abated. After about 1 or 2 months her symptoms returned and she has resumed Prilosec for the past month without control of her symptoms. She notes frequent lower abdominal pain associated with intestinal rumbling and bloating. She notes nausea mainly in the mornings, sometimes associated with vomiting. When her symptoms started several months ago she had epigastric pain as well. Abdominal ultrasound showed fatty liver. She has had a 5-10 pound intentional weight loss over the past several months. Denies constipation, diarrhea, change in stool caliber, melena, hematochezia, dysphagia, reflux symptoms, chest pain.  Review of Systems: Pertinent positive and negative review of systems were noted in the above HPI section. All other review of systems were otherwise negative.  Current Medications, Allergies, Past Medical History, Past Surgical History, Family History and Social History were reviewed in [employer] record.  Physical Exam: General: Well developed , well nourished, no acute distress Head: Normocephalic and atraumatic Eyes:  sclerae anicteric, EOMI Ears: Normal auditory acuity Mouth: No deformity or lesions Neck: Supple, no masses or thyromegaly Lungs: Clear throughout to auscultation Heart: Regular rate and rhythm; no murmurs, rubs or bruits Abdomen: Soft, non tender and non distended. No masses, hepatosplenomegaly or hernias noted. Normal Bowel sounds Musculoskeletal: Symmetrical with no gross deformities  Skin: No lesions on visible extremities Pulses:  Normal pulses noted Extremities: No clubbing, cyanosis, edema or deformities noted Neurological: Alert oriented x 4, grossly nonfocal Cervical Nodes:  No significant cervical adenopathy Inguinal Nodes: No significant  inguinal adenopathy Psychological:  Alert and cooperative. Normal mood and affect  Assessment and Recommendations:  1. Morning nausea with occasional vomiting. Rule out GERD and ulcer disease. Continue omeprazole 40 mg daily. Schedule upper endoscopy. The risks, benefits, and alternatives to endoscopy with possible biopsy and possible dilation were discussed with the patient and they consent to proceed.   2. Lower abdominal pain associated with bloating and rumbling. Trial of Levsin as needed.  3. Fatty liver. Long-term weight loss and low fat diet with appropriate management of diabetes and lipids.  4. Colorectal cancer screening, average risk. Last colonoscopy March 2010. 10 year followup recommended.

## 2012-09-01 NOTE — Patient Instructions (Addendum)
We have sent the following medications to your pharmacy for you to pick up at your convenience: Levsin.  You have been scheduled for an endoscopy with propofol. Please follow written instructions given to you at your visit today. If you use inhalers (even only as needed), please bring them with you on the day of your procedure. Your physician has requested that you go to www.startemmi.com and enter the access code given to you at your visit today. This web site gives a general overview about your procedure. However, you should still follow specific instructions given to you by our office regarding your preparation for the procedure.  Thank you for choosing me and [facility].  Malcolm T. Stark, Jr., MD., FACG  

## 2012-09-04 ENCOUNTER — Telehealth: Payer: Self-pay | Admitting: *Deleted

## 2012-09-04 NOTE — Telephone Encounter (Signed)
Voltaren 1 % gel is not covered. Pt needs to try meloxicam, naproxen or tramadol. Please advise.

## 2012-09-05 NOTE — Telephone Encounter (Signed)
She is on Oxycodone - can't use Tramadol. She can't use NSAIDs due to stomach Thx

## 2012-09-10 NOTE — Telephone Encounter (Signed)
Form completed and pending signature

## 2012-09-17 ENCOUNTER — Ambulatory Visit (AMBULATORY_SURGERY_CENTER): Payer: BC Managed Care – PPO | Admitting: Gastroenterology

## 2012-09-17 ENCOUNTER — Encounter: Payer: Self-pay | Admitting: Gastroenterology

## 2012-09-17 VITALS — BP 162/77 | HR 62 | Temp 98.1°F | Resp 29 | Ht 67.0 in | Wt 222.0 lb

## 2012-09-17 DIAGNOSIS — K2990 Gastroduodenitis, unspecified, without bleeding: Secondary | ICD-10-CM

## 2012-09-17 DIAGNOSIS — K2970 Gastritis, unspecified, without bleeding: Secondary | ICD-10-CM

## 2012-09-17 DIAGNOSIS — R112 Nausea with vomiting, unspecified: Secondary | ICD-10-CM

## 2012-09-17 DIAGNOSIS — K2960 Other gastritis without bleeding: Secondary | ICD-10-CM

## 2012-09-17 DIAGNOSIS — R109 Unspecified abdominal pain: Secondary | ICD-10-CM

## 2012-09-17 DIAGNOSIS — K253 Acute gastric ulcer without hemorrhage or perforation: Secondary | ICD-10-CM

## 2012-09-17 DIAGNOSIS — R1013 Epigastric pain: Secondary | ICD-10-CM

## 2012-09-17 LAB — GLUCOSE, CAPILLARY
Glucose-Capillary: 223 mg/dL — ABNORMAL HIGH (ref 70–99)
Glucose-Capillary: 198 mg/dL — ABNORMAL HIGH (ref 70–99)

## 2012-09-17 MED ORDER — SODIUM CHLORIDE 0.9 % IV SOLN: 500.0000 mL | INTRAVENOUS | Status: DC

## 2012-09-17 NOTE — Progress Notes (Signed)
Procedure ends, to recovery, report given and VSS. 

## 2012-09-17 NOTE — Op Note (Signed)
[facility] [address] [place], [address]   ENDOSCOPY PROCEDURE REPORT  PATIENT: Savannah, [name]  MR#: [id number] BIRTHDATE: 12-21-1955 , 57  yrs. old GENDER: Female ENDOSCOPIST: Meryl Dare, MD, [facility] PROCEDURE DATE:  09/17/2012 PROCEDURE:  EGD w/ biopsy ASA CLASS:     Class II INDICATIONS:  Epigastric pain. Nausea. Vomiting. MEDICATIONS: MAC sedation, administered by CRNA and propofol (Diprivan) 120mg  IV TOPICAL ANESTHETIC: Cetacaine Spray DESCRIPTION OF PROCEDURE: After the risks benefits and alternatives of the procedure were thoroughly explained, informed consent was obtained.  The LB [id number] endoscope was introduced through the mouth and advanced to the second portion of the duodenum  without limitations.  The instrument was slowly withdrawn as the mucosa was fully examined.  ESOPHAGUS: The mucosa of the esophagus appeared normal. STOMACH: A single non-bleeding deep and clean-based ulcer, measuring 8 x 10mm in size, was found on the lesser curvature of the gastric antrum. Biopsies were taken around the ulcer. Mild gastritis was found in the gastric antrum, gastric body, and gastric fundus. It was erythematous and granular. Multiple biopsies were performed. DUODENUM: The duodenal mucosa showed no abnormalities in the bulb and second portion of the duodenum.  Retroflexed views revealed no abnormalities.   The scope was then withdrawn from the patient and the procedure completed.  COMPLICATIONS: There were no complications.  ENDOSCOPIC IMPRESSION: 1.   Gastric ulcer, measuring 8 x 10mm on the lesser curvature of the gastric antrum 2.   Gastritis in the gastric antrum, gastric body, and gastric fundus; multiple biopsies  RECOMMENDATIONS: 1.  Await pathology results 2.  Avoid ASA/NSAIDS 3.  Continue PPI long term 4.  Endoscopy in 3 months to document ulcer healing   eSigned:  Meryl Dare, MD, [facility] 09/17/2012 1:34  PM

## 2012-09-17 NOTE — Progress Notes (Signed)
Patient did not experience any of the following events: a burn prior to discharge; a fall within the facility; wrong site/side/patient/procedure/implant event; or a hospital transfer or hospital admission upon discharge from the facility. (G8907) Patient did not have preoperative order for IV antibiotic SSI prophylaxis. (G8918)  

## 2012-09-17 NOTE — Patient Instructions (Signed)

## 2012-09-17 NOTE — Progress Notes (Signed)
Called to room to assist during endoscopic procedure.  Patient ID and intended procedure confirmed with present staff. Received instructions for my participation in the procedure from the performing physician.  

## 2012-09-18 ENCOUNTER — Telehealth: Payer: Self-pay | Admitting: Pulmonary Disease

## 2012-09-18 ENCOUNTER — Other Ambulatory Visit: Payer: Self-pay | Admitting: *Deleted

## 2012-09-18 MED ORDER — OMEPRAZOLE 40 MG PO CPDR: 40.0000 mg | DELAYED_RELEASE_CAPSULE | Freq: Every day | ORAL | Status: DC

## 2012-09-18 NOTE — Telephone Encounter (Signed)
Will forward to KC as FYI 

## 2012-09-18 NOTE — Telephone Encounter (Signed)
Noted  

## 2012-09-19 ENCOUNTER — Telehealth: Payer: Self-pay | Admitting: *Deleted

## 2012-09-19 NOTE — Telephone Encounter (Signed)
Left message

## 2012-09-24 ENCOUNTER — Encounter: Payer: Self-pay | Admitting: Gastroenterology

## 2012-09-25 ENCOUNTER — Telehealth: Payer: Self-pay | Admitting: *Deleted

## 2012-09-25 NOTE — Telephone Encounter (Signed)
PA approved. See 09/25/12 phone note.

## 2012-09-25 NOTE — Telephone Encounter (Signed)
Voltaren 1 % gel is approved from 08/16/12 until 09/15/12. Pharmacy informed.

## 2012-10-03 ENCOUNTER — Encounter: Payer: Self-pay | Admitting: Endocrinology

## 2012-11-21 ENCOUNTER — Ambulatory Visit (INDEPENDENT_AMBULATORY_CARE_PROVIDER_SITE_OTHER): Payer: BC Managed Care – PPO | Admitting: Endocrinology

## 2012-11-21 ENCOUNTER — Encounter: Payer: Self-pay | Admitting: Endocrinology

## 2012-11-21 VITALS — BP 124/70 | HR 90 | Wt 228.0 lb

## 2012-11-21 DIAGNOSIS — M545 Low back pain, unspecified: Secondary | ICD-10-CM

## 2012-11-21 MED ORDER — OXYCODONE HCL 10 MG PO TABS: 10.0000 mg | ORAL_TABLET | ORAL | Status: DC | PRN

## 2012-11-21 NOTE — Progress Notes (Signed)
Subjective:    Patient ID: Savannah [name], female    DOB: May 09, 1955, 57 y.o.   MRN: [id number]  HPI Pt states few weeks of severe pain at the lower back, but no assoc bowel retention.  She last saw dr Ethelene Hal more than 1 year ago.  She says this is similar to the pain she had in the past.   Past Medical History  Diagnosis Date  . Unspecified hypothyroidism 01/07/2007  . 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 HYPERCHOLESTEROLEMIA 04/18/2009  . HYPOKALEMIA 01/15/2007  . ANEMIA, IRON DEFICIENCY 12/06/2008  . OBSTRUCTIVE SLEEP APNEA 08/09/2009  . HYPERTENSION 09/27/2006  . ALLERGIC RHINITIS CAUSE UNSPECIFIED 12/29/2007  . ASTHMA 09/27/2006  . FATTY LIVER DISEASE 03/19/2008  . URINARY CALCULUS 11/08/2009  . 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 BACK PAIN, LUMBAR 12/29/2009  . Palpitations 05/23/2009  . ASYMPTOMATIC POSTMENOPAUSAL STATUS 03/19/2008  . Chronic kidney disease     Past Surgical History  Procedure Laterality Date  . Knee arthroscopy Left 1993  . Cardiac catheterization  2011  . Compound fracture Right     arm    History   Social History  . Marital Status: Married    Spouse Name: N/A    Number of Children: 2  . Years of Education: N/A   Occupational History  . Homemaker    Social History Main Topics  . Smoking status: Former Smoker -- 0.30 packs/day for 2 years    Types: Cigarettes    Quit date: 02/27/1975  . Smokeless tobacco: Never Used  . Alcohol Use: No  . Drug Use: No  . Sexual Activity: Not on file   Other Topics Concern  . Not on file   Social History Narrative   Pt has completed training as a Energy manager    Current Outpatient Prescriptions on File Prior to Visit  Medication Sig Dispense Refill  . albuterol (VENTOLIN HFA) 108 (90 BASE) MCG/ACT inhaler 2 puffs up to four times daily as needed for wheezing       . atorvastatin (LIPITOR) 40 MG tablet Take 1 tablet (40 mg total) by mouth daily.  90 tablet  3  . cholecalciferol (VITAMIN D) 1000 UNITS tablet Take 1,000  Units by mouth 2 (two) times daily.      . citalopram (CELEXA) 40 MG tablet Take 1 tablet (40 mg total) by mouth daily.  90 tablet  2  . clotrimazole-betamethasone (LOTRISONE) cream Apply topically 2 (two) times daily. Three times a day as needed for foot cracking  30 g  3  . diclofenac sodium (VOLTAREN) 1 % GEL Apply 2 g topically 4 (four) times daily.  100 g  3  . ferrous fumarate (HEMOCYTE - 106 MG FE) 325 (106 FE) MG TABS Take 1 tablet by mouth daily.      Marland Kitchen gabapentin (NEURONTIN) 300 MG capsule Take 300 mg by mouth at bedtime.      Marland Kitchen glucose blood (ONE TOUCH TEST STRIPS) test strip Once daily dx 250.00       . hyoscyamine (LEVSIN SL) 0.125 MG SL tablet Take 1-2 tablets by mouth or under tongue every 4 hours as needed.  100 tablet  5  . levothyroxine (SYNTHROID, LEVOTHROID) 200 MCG tablet Take 1 tablet (200 mcg total) by mouth daily.  90 tablet  6  . losartan-hydrochlorothiazide (HYZAAR) 100-12.5 MG per tablet TAKE 1 TABLET DAILY  90 tablet  3  . meclizine (ANTIVERT) 12.5 MG tablet Take 1 tablet (12.5 mg total) by mouth 3 (three)  times daily as needed for dizziness. 1-2 by mouth three times a day as needed  50 tablet  3  . metoprolol succinate (TOPROL-XL) 25 MG 24 hr tablet TAKE 1 TABLET DAILY  90 tablet  3  . omeprazole (PRILOSEC) 40 MG capsule Take 1 capsule (40 mg total) by mouth daily.  30 capsule  1  . ondansetron (ZOFRAN) 4 MG tablet Take 1 tablet (4 mg total) by mouth every 6 (six) hours as needed for nausea.  30 tablet  1  . pioglitazone (ACTOS) 15 MG tablet Take 1 tablet (15 mg total) by mouth daily.  90 tablet  3  . traZODone (DESYREL) 100 MG tablet Take 1 tablet (100 mg total) by mouth at bedtime.  90 tablet  6   No current facility-administered medications on file prior to visit.    Allergies  Allergen Reactions  . Erythromycin     REACTION: nausea/vomiting    Family History  Problem Relation Age of Onset  . Colon cancer Mother   . Heart attack Father   . Heart disease  Father   . Allergies Sister   . Asthma Sister   . Heart disease Paternal Grandfather   . Clotting disorder Grandchild   . Diabetes Mother   . Kidney disease Mother     BP 124/70  Pulse 90  Wt 228 lb (103.42 kg)  BMI 35.7 kg/m2  SpO2 96%  Review of Systems Denies bladder retention, but she has anxiety.     Objective:   Physical Exam VITAL SIGNS:  See vs page GENERAL: no distress Sacral area: nontender Gait: she favors the RLE, due to an old ankle injury.      Assessment & Plan:  Low-back pain, recurrent Anxiety, probably exacerbated by pain

## 2012-11-21 NOTE — Patient Instructions (Addendum)
Refer back to dr Ethelene Hal.  you will receive a phone call, about a day and time for an appointment. Pending that appointment, here is a refill of your oxycodone pill. If the anxiety persists when the pain is better controlled, we can address that as a separate problem.

## 2012-11-25 ENCOUNTER — Other Ambulatory Visit: Payer: Self-pay

## 2012-11-25 MED ORDER — OMEPRAZOLE 40 MG PO CPDR: 40.0000 mg | DELAYED_RELEASE_CAPSULE | Freq: Every day | ORAL | Status: DC

## 2012-12-01 ENCOUNTER — Ambulatory Visit (AMBULATORY_SURGERY_CENTER): Payer: Self-pay | Admitting: *Deleted

## 2012-12-01 VITALS — Ht 67.5 in | Wt 223.0 lb

## 2012-12-01 DIAGNOSIS — K253 Acute gastric ulcer without hemorrhage or perforation: Secondary | ICD-10-CM

## 2012-12-01 NOTE — Progress Notes (Signed)
No allergies to eggs or soy. No problems with anesthesia.  

## 2012-12-05 ENCOUNTER — Encounter: Payer: Self-pay | Admitting: Gastroenterology

## 2012-12-12 ENCOUNTER — Ambulatory Visit (AMBULATORY_SURGERY_CENTER): Payer: BC Managed Care – PPO | Admitting: Gastroenterology

## 2012-12-12 ENCOUNTER — Encounter: Payer: Self-pay | Admitting: Gastroenterology

## 2012-12-12 VITALS — BP 142/73 | HR 58 | Temp 98.4°F | Resp 18 | Ht 67.5 in | Wt 223.0 lb

## 2012-12-12 DIAGNOSIS — K2960 Other gastritis without bleeding: Secondary | ICD-10-CM

## 2012-12-12 DIAGNOSIS — K253 Acute gastric ulcer without hemorrhage or perforation: Secondary | ICD-10-CM

## 2012-12-12 LAB — GLUCOSE, CAPILLARY
Glucose-Capillary: 98 mg/dL (ref 70–99)
Glucose-Capillary: 109 mg/dL — ABNORMAL HIGH (ref 70–99)

## 2012-12-12 MED ORDER — SODIUM CHLORIDE 0.9 % IV SOLN: 500.0000 mL | INTRAVENOUS | Status: DC

## 2012-12-12 MED ORDER — OMEPRAZOLE 40 MG PO CPDR: 40.0000 mg | DELAYED_RELEASE_CAPSULE | Freq: Two times a day (BID) | ORAL | Status: DC

## 2012-12-12 NOTE — Op Note (Signed)
[facility] [address] [place], [address]   ENDOSCOPY PROCEDURE REPORT  PATIENT: Savannah, [name]  MR#: [id number] BIRTHDATE: [birth year] , 57  yrs. old GENDER: Female ENDOSCOPIST: Meryl Dare, MD, [facility] PROCEDURE DATE:  12/12/2012 PROCEDURE:  EGD w/ biopsy ASA CLASS:     Class II INDICATIONS:  Follow up gastric ulcer. MEDICATIONS: MAC sedation, administered by CRNA and propofol (Diprivan) 140mg  IV TOPICAL ANESTHETIC: none DESCRIPTION OF PROCEDURE: After the risks benefits and alternatives of the procedure were thoroughly explained, informed consent was obtained.  The LB [id number] endoscope was introduced through the mouth and advanced to the second portion of the duodenum  without limitations.  The instrument was slowly withdrawn as the mucosa was fully examined.  STOMACH: Erythematous, elevated, soft friable tissue measuring 1cm x 2cm at prior ulcer site-likely granulation tissue but not a typical appearance.  This extended along the lesser curvature to the pylorus.  Multiple biopsies obtained.  Mild gastritis in the antrum, body and fundus. The stomach otherwise appeared normal. ESOPHAGUS: The mucosa of the esophagus appeared normal. DUODENUM: The duodenal mucosa showed no abnormalities in the bulb and second portion of the duodenum.  Retroflexed views revealed no abnormalities.   The scope was then withdrawn from the patient and the procedure completed.  COMPLICATIONS: There were no complications.  ENDOSCOPIC IMPRESSION: 1.   Presumed granulation tissue at prior antral ulcer site; multiple biopsies obtained. 2.   Mild diffuse gastritis  RECOMMENDATIONS: 1.  Continue PPI 2.  Await pathology results   eSigned:  Meryl Dare, MD, [facility] 12/12/2012 11:59 AM

## 2012-12-12 NOTE — Progress Notes (Signed)
Called to room to assist during endoscopic procedure.  Patient ID and intended procedure confirmed with present staff. Received instructions for my participation in the procedure from the performing physician.  

## 2012-12-12 NOTE — Progress Notes (Signed)
Patient did not experience any of the following events: a burn prior to discharge; a fall within the facility; wrong site/side/patient/procedure/implant event; or a hospital transfer or hospital admission upon discharge from the facility. (G8907) Patient did not have preoperative order for IV antibiotic SSI prophylaxis. (G8918)  

## 2012-12-12 NOTE — Progress Notes (Signed)
Procedure ends, to recovery, report given and VSS. 

## 2012-12-12 NOTE — Patient Instructions (Signed)
Impressions/recommendations:  Gastritis (handout given)  Resume omeprazole. Start twice daily dosing.  YOU HAD AN ENDOSCOPIC PROCEDURE TODAY AT THE [facility]: Refer to the procedure report that was given to you for any specific questions about what was found during the examination.  If the procedure report does not answer your questions, please call your gastroenterologist to clarify.  If you requested that your care partner not be given the details of your procedure findings, then the procedure report has been included in a sealed envelope for you to review at your convenience later.  YOU SHOULD EXPECT: Some feelings of bloating in the abdomen. Passage of more gas than usual.  Walking can help get rid of the air that was put into your GI tract during the procedure and reduce the bloating. If you had a lower endoscopy (such as a colonoscopy or flexible sigmoidoscopy) you may notice spotting of blood in your stool or on the toilet paper. If you underwent a bowel prep for your procedure, then you may not have a normal bowel movement for a few days.  DIET: Your first meal following the procedure should be a light meal and then it is ok to progress to your normal diet.  A half-sandwich or bowl of soup is an example of a good first meal.  Heavy or fried foods are harder to digest and may make you feel nauseous or bloated.  Likewise meals heavy in dairy and vegetables can cause extra gas to form and this can also increase the bloating.  Drink plenty of fluids but you should avoid alcoholic beverages for 24 hours.  ACTIVITY: Your care partner should take you home directly after the procedure.  You should plan to take it easy, moving slowly for the rest of the day.  You can resume normal activity the day after the procedure however you should NOT DRIVE or use heavy machinery for 24 hours (because of the sedation medicines used during the test).    SYMPTOMS TO REPORT IMMEDIATELY: A  gastroenterologist can be reached at any hour.  During normal business hours, 8:30 AM to 5:00 PM Monday through Friday, call [phone].  After hours and on weekends, please call the GI answering service at [phone] who will take a message and have the physician on call contact you.   Following upper endoscopy (EGD)  Vomiting of blood or coffee ground material  New chest pain or pain under the shoulder blades  Painful or persistently difficult swallowing  New shortness of breath  Fever of 100F or higher  Black, tarry-looking stools  FOLLOW UP: If any biopsies were taken you will be contacted by phone or by letter within the next 1-3 weeks.  Call your gastroenterologist if you have not heard about the biopsies in 3 weeks.  Our staff will call the home number listed on your records the next business day following your procedure to check on you and address any questions or concerns that you may have at that time regarding the information given to you following your procedure. This is a courtesy call and so if there is no answer at the home number and we have not heard from you through the emergency physician on call, we will assume that you have returned to your regular daily activities without incident.  SIGNATURES/CONFIDENTIALITY: You and/or your care partner have signed paperwork which will be entered into your electronic medical record.  These signatures attest to the fact that that the information above on  your After Visit Summary has been reviewed and is understood.  Full responsibility of the confidentiality of this discharge information lies with you and/or your care-partner. 

## 2012-12-15 ENCOUNTER — Other Ambulatory Visit: Payer: Self-pay

## 2012-12-15 ENCOUNTER — Telehealth: Payer: Self-pay | Admitting: *Deleted

## 2012-12-15 DIAGNOSIS — K253 Acute gastric ulcer without hemorrhage or perforation: Secondary | ICD-10-CM

## 2012-12-15 MED ORDER — OMEPRAZOLE 40 MG PO CPDR: 40.0000 mg | DELAYED_RELEASE_CAPSULE | Freq: Two times a day (BID) | ORAL | Status: DC

## 2012-12-15 NOTE — Telephone Encounter (Signed)
  Follow up Call-  Call back number 12/12/2012 09/17/2012  Post procedure Call Back phone  # [phone]  Permission to leave phone message Yes Yes     Patient questions:  Do you have a fever, pain , or abdominal swelling? no Pain Score  0 *  Have you tolerated food without any problems? yes  Have you been able to return to your normal activities? yes  Do you have any questions about your discharge instructions: Diet   no Medications  no Follow up visit  no  Do you have questions or concerns about your Care? no  Actions: * If pain score is 4 or above: No action needed, pain <4.  C/o some "soreness under my chin."  Denies trouble breathing or swallowing.  Told to call back if soreness increases and understanding voiced

## 2012-12-17 ENCOUNTER — Encounter: Payer: Self-pay | Admitting: Gastroenterology

## 2012-12-23 ENCOUNTER — Telehealth: Payer: Self-pay

## 2012-12-23 NOTE — Telephone Encounter (Signed)
Please send this message to [facility]

## 2012-12-23 NOTE — Telephone Encounter (Signed)
Pt states a referral was sent to Dr. Ethelene Hal for pt's back.  Pt and the office had been playing phone tag and when called back to schedule an appt and was told that the referral needed to be sent again.  Pls advise.

## 2012-12-31 ENCOUNTER — Encounter: Payer: Self-pay | Admitting: Cardiology

## 2013-01-01 ENCOUNTER — Other Ambulatory Visit: Payer: Self-pay

## 2013-01-23 ENCOUNTER — Ambulatory Visit: Payer: BC Managed Care – PPO | Admitting: Internal Medicine

## 2013-01-23 DIAGNOSIS — Z0289 Encounter for other administrative examinations: Secondary | ICD-10-CM

## 2013-02-04 ENCOUNTER — Encounter: Payer: Self-pay | Admitting: Internal Medicine

## 2013-02-04 ENCOUNTER — Ambulatory Visit (INDEPENDENT_AMBULATORY_CARE_PROVIDER_SITE_OTHER): Payer: Self-pay | Admitting: Internal Medicine

## 2013-02-04 VITALS — BP 130/72 | HR 76 | Temp 98.7°F | Resp 16 | Wt 221.0 lb

## 2013-02-04 DIAGNOSIS — M778 Other enthesopathies, not elsewhere classified: Secondary | ICD-10-CM

## 2013-02-04 DIAGNOSIS — R05 Cough: Secondary | ICD-10-CM

## 2013-02-04 DIAGNOSIS — M65839 Other synovitis and tenosynovitis, unspecified forearm: Secondary | ICD-10-CM

## 2013-02-04 DIAGNOSIS — M25531 Pain in right wrist: Secondary | ICD-10-CM

## 2013-02-04 DIAGNOSIS — J209 Acute bronchitis, unspecified: Secondary | ICD-10-CM

## 2013-02-04 DIAGNOSIS — J019 Acute sinusitis, unspecified: Secondary | ICD-10-CM | POA: Insufficient documentation

## 2013-02-04 DIAGNOSIS — G8929 Other chronic pain: Secondary | ICD-10-CM

## 2013-02-04 DIAGNOSIS — M25539 Pain in unspecified wrist: Secondary | ICD-10-CM

## 2013-02-04 MED ORDER — PROMETHAZINE-CODEINE 6.25-10 MG/5ML PO SYRP: 5.0000 mL | ORAL_SOLUTION | ORAL | Status: DC | PRN

## 2013-02-04 MED ORDER — METHYLPREDNISOLONE ACETATE 20 MG/ML IJ SUSP: 10.0000 mg | Freq: Once | INTRAMUSCULAR | Status: DC

## 2013-02-04 MED ORDER — CEFUROXIME AXETIL 500 MG PO TABS: 500.0000 mg | ORAL_TABLET | Freq: Two times a day (BID) | ORAL | Status: DC

## 2013-02-04 NOTE — Assessment & Plan Note (Signed)
See meds 

## 2013-02-04 NOTE — Progress Notes (Signed)
   Subjective:     Sinusitis This is a new problem. The current episode started 1 to 4 weeks ago. The problem has been gradually worsening since onset. There has been no fever. The pain is moderate. Associated symptoms include congestion, coughing and sinus pressure. Pertinent negatives include no shortness of breath. The treatment provided no relief.    C/o URI C/o R thumb pain x 1 year - worse lately  Review of Systems  Constitutional: Positive for fatigue. Negative for fever.  HENT: Positive for congestion, postnasal drip, rhinorrhea and sinus pressure.   Respiratory: Positive for cough. Negative for shortness of breath and wheezing.   Musculoskeletal: Positive for arthralgias and back pain.       Objective:   Physical Exam  Constitutional: She appears well-developed. No distress.  Obese  HENT:  Head: Normocephalic.  Right Ear: External ear normal.  Left Ear: External ear normal.  Mouth/Throat: Oropharynx is clear and moist.  eryth throat  Eyes: Conjunctivae are normal. Pupils are equal, round, and reactive to light. Right eye exhibits no discharge. Left eye exhibits no discharge.  Neck: Normal range of motion. Neck supple. No JVD present. No tracheal deviation present. No thyromegaly present.  Cardiovascular: Normal rate, regular rhythm and normal heart sounds.   Pulmonary/Chest: No stridor. No respiratory distress. She has no wheezes.  Abdominal: Soft. Bowel sounds are normal. She exhibits no distension and no mass. There is no tenderness. There is no rebound and no guarding.  Musculoskeletal: She exhibits tenderness. She exhibits no edema.  R abd poll longus is very tender  Lymphadenopathy:    She has no cervical adenopathy.  Neurological: She displays normal reflexes. No cranial nerve deficit. She exhibits normal muscle tone. Coordination normal.  Skin: No rash noted. No erythema.  Psychiatric: She has a normal mood and affect. Her behavior is normal. Judgment and  thought content normal.      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 Informed consent was obtained and signed.   Tthe patient was placed in a comfortable position.  abductor pollucis longus tendon was marked and  the skin was prepped with Betadine and alcohol. 1/2 inch 27-gauge needle was used. The needle was advanced  into the skin down to tendon. It  was injected with 1 mL of 2% lidocaine and 20 mg of Depo-Medrol in a usual fashion.  Band-Aid.  Applied ACE wrap.   Tolerated well. Complications: None.       Assessment & Plan:

## 2013-02-04 NOTE — Assessment & Plan Note (Signed)
7/14 abd poll longus tendonitis - will inject See procedure

## 2013-02-04 NOTE — Assessment & Plan Note (Signed)
Ceftin x10 d 

## 2013-02-04 NOTE — Progress Notes (Signed)
Pre visit review using our clinic review tool, if applicable. No additional management support is needed unless otherwise documented below in the visit note. 

## 2013-02-04 NOTE — Assessment & Plan Note (Signed)
Due to URI Prom-cod (not with Oxycod)

## 2013-02-04 NOTE — Patient Instructions (Signed)
A Band-Aid should be left on for 12 hours. Injection therapy is not a cure itself. It is used in conjunction with other modalities. You can use Tylenol if needed. You need to report immediately  if fever, chills or any signs of infection develop.  Pennsaid 3 times a day

## 2013-02-22 ENCOUNTER — Ambulatory Visit (INDEPENDENT_AMBULATORY_CARE_PROVIDER_SITE_OTHER): Payer: BC Managed Care – PPO | Admitting: Emergency Medicine

## 2013-02-22 ENCOUNTER — Ambulatory Visit: Payer: BC Managed Care – PPO

## 2013-02-22 VITALS — BP 110/83 | HR 96 | Temp 100.1°F | Resp 19 | Ht 68.0 in | Wt 220.0 lb

## 2013-02-22 DIAGNOSIS — J45901 Unspecified asthma with (acute) exacerbation: Secondary | ICD-10-CM

## 2013-02-22 DIAGNOSIS — R05 Cough: Secondary | ICD-10-CM

## 2013-02-22 DIAGNOSIS — J111 Influenza due to unidentified influenza virus with other respiratory manifestations: Secondary | ICD-10-CM

## 2013-02-22 LAB — POCT INFLUENZA A/B
Influenza A, POC: NEGATIVE
Influenza B, POC: NEGATIVE

## 2013-02-22 MED ORDER — OSELTAMIVIR PHOSPHATE 75 MG PO CAPS: 75.0000 mg | ORAL_CAPSULE | Freq: Two times a day (BID) | ORAL | Status: DC

## 2013-02-22 MED ORDER — ALBUTEROL SULFATE HFA 108 (90 BASE) MCG/ACT IN AERS: 2.0000 | INHALATION_SPRAY | RESPIRATORY_TRACT | Status: DC | PRN

## 2013-02-22 MED ORDER — IPRATROPIUM BROMIDE 0.02 % IN SOLN
0.5000 mg | Freq: Once | RESPIRATORY_TRACT | Status: AC
  Administered 2013-02-22: 0.5 mg via RESPIRATORY_TRACT

## 2013-02-22 MED ORDER — ALBUTEROL SULFATE (2.5 MG/3ML) 0.083% IN NEBU
2.5000 mg | INHALATION_SOLUTION | Freq: Once | RESPIRATORY_TRACT | Status: AC
  Administered 2013-02-22: 2.5 mg via RESPIRATORY_TRACT

## 2013-02-22 NOTE — Progress Notes (Signed)
[facility] [address], [place] [address] [phone]  Date:  02/22/2013   Name:  Savannah [name]   DOB:  1955/10/29   MRN:  [id number]  PCP:  Romero Belling, MD    Chief Complaint: URI and Cough   History of Present Illness:  Savannah [name] is a 57 y.o. very pleasant female patient who presents with the following:  History of asthma.  No inhaler. Has cough and intermittent wheezing for past month.  Coughing purulent sputum.  Some shortness of breath with exertion and generalized wheezing.  Some nasal congestion with clear nasal drainage.  No improvement with over the counter medications or other home remedies. Denies other complaint or health concern today.   Patient Active Problem List   Diagnosis Date Noted  . Sinusitis, acute 02/04/2013  . Right wrist pain 08/26/2012  . Neoplasm of uncertain behavior of skin 08/26/2012  . Routine general medical examination at a health care facility 08/02/2012  . Nausea with vomiting 08/01/2012  . Abdominal pain, epigastric 01/15/2012  . Vertigo 07/24/2011  . Unspecified vitamin D deficiency 12/09/2010  . Hyperparathyroidism 12/06/2010  . Dysuria 10/23/2010  . Cough 10/23/2010  . Muscle weakness 07/14/2010  . 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 BACK PAIN, LUMBAR 12/29/2009  . URINARY CALCULUS 11/08/2009  . OBSTRUCTIVE SLEEP APNEA 08/09/2009  . HEMATURIA UNSPECIFIED 06/28/2009  . OTITIS MEDIA, LEFT 06/14/2009  . PALPITATIONS 05/23/2009  . HYPERCHOLESTEROLEMIA 04/18/2009  . 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 CHEST PAIN 04/18/2009  . ANEMIA, IRON DEFICIENCY 12/06/2008  . DIZZINESS 12/06/2008  . FATTY LIVER DISEASE 03/19/2008  . ANKLE SPRAIN, RIGHT 03/19/2008  . ASYMPTOMATIC POSTMENOPAUSAL STATUS 03/19/2008  . ALLERGIC RHINITIS CAUSE UNSPECIFIED 12/29/2007  . ASTHMATIC BRONCHITIS, ACUTE 08/16/2007  . NECK PAIN 06/18/2007  . 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 HYPOKALEMIA 01/15/2007  .  UNSPECIFIED HYPOTHYROIDISM 01/07/2007  . HYPERTENSION 09/27/2006  . ASTHMA 09/27/2006    Past Medical History  Diagnosis Date  . Unspecified hypothyroidism 01/07/2007  . 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 HYPERCHOLESTEROLEMIA 04/18/2009  . HYPOKALEMIA 01/15/2007  . ANEMIA, IRON DEFICIENCY 12/06/2008  . OBSTRUCTIVE SLEEP APNEA 08/09/2009  . HYPERTENSION 09/27/2006  . ALLERGIC RHINITIS CAUSE UNSPECIFIED 12/29/2007  . ASTHMA 09/27/2006  . FATTY LIVER DISEASE 03/19/2008  . URINARY CALCULUS 11/08/2009  . 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 BACK PAIN, LUMBAR 12/29/2009  . Palpitations 05/23/2009  . ASYMPTOMATIC POSTMENOPAUSAL STATUS 03/19/2008  . Chronic kidney disease     Past Surgical History  Procedure Laterality Date  . Knee arthroscopy Left 1993  . Cardiac catheterization  2011  . Compound fracture Right     arm    History  Substance Use Topics  . Smoking status: Former Smoker -- 0.30 packs/day for 2 years    Types: Cigarettes    Quit date: 02/27/1975  . Smokeless tobacco: Never Used  . Alcohol Use: No    Family History  Problem Relation Age of Onset  . Colon cancer Mother 42  . Diabetes Mother   . Kidney disease Mother   . Heart attack Father   . Heart disease Father   . Allergies Sister   . Asthma Sister   . Heart disease Paternal Grandfather   . Clotting disorder Grandchild     Allergies  Allergen Reactions  . Erythromycin     REACTION: nausea/vomiting    Medication list has been reviewed and updated.  Current Outpatient Prescriptions on File Prior to Visit  Medication Sig  Dispense Refill  . atorvastatin (LIPITOR) 40 MG tablet Take 1 tablet (40 mg total) by mouth daily.  90 tablet  3  . cholecalciferol (VITAMIN D) 1000 UNITS tablet Take 1,000 Units by mouth 2 (two) times daily.      . citalopram (CELEXA) 40 MG tablet Take 1 tablet (40 mg total) by mouth daily.  90 tablet  2  . clotrimazole-betamethasone (LOTRISONE) cream Apply topically 2 (two) times daily. Three times  a day as needed for foot cracking  30 g  3  . ferrous fumarate (HEMOCYTE - 106 MG FE) 325 (106 FE) MG TABS Take 1 tablet by mouth daily.      Marland Kitchen gabapentin (NEURONTIN) 300 MG capsule Take 300 mg by mouth at bedtime.      Marland Kitchen glucose blood (ONE TOUCH TEST STRIPS) test strip Once daily dx 250.00       . hyoscyamine (LEVSIN SL) 0.125 MG SL tablet Take 1-2 tablets by mouth or under tongue every 4 hours as needed.  100 tablet  5  . levothyroxine (SYNTHROID, LEVOTHROID) 200 MCG tablet Take 1 tablet (200 mcg total) by mouth daily.  90 tablet  6  . losartan-hydrochlorothiazide (HYZAAR) 100-12.5 MG per tablet TAKE 1 TABLET DAILY  90 tablet  3  . meclizine (ANTIVERT) 12.5 MG tablet Take 1 tablet (12.5 mg total) by mouth 3 (three) times daily as needed for dizziness. 1-2 by mouth three times a day as needed  50 tablet  3  . metFORMIN (GLUCOPHAGE) 500 MG tablet Take 500 mg by mouth 2 (two) times daily with a meal.      . metoprolol succinate (TOPROL-XL) 25 MG 24 hr tablet TAKE 1 TABLET DAILY  90 tablet  3  . omeprazole (PRILOSEC) 40 MG capsule Take 1 capsule (40 mg total) by mouth 2 (two) times daily.  180 capsule  3  . ondansetron (ZOFRAN) 4 MG tablet Take 1 tablet (4 mg total) by mouth every 6 (six) hours as needed for nausea.  30 tablet  1  . Oxycodone HCl 10 MG TABS Take 1 tablet (10 mg total) by mouth every 4 (four) hours as needed. For pain  120 each  0  . pioglitazone (ACTOS) 15 MG tablet Take 1 tablet (15 mg total) by mouth daily.  90 tablet  3  . traZODone (DESYREL) 100 MG tablet Take 1 tablet (100 mg total) by mouth at bedtime.  90 tablet  6  . albuterol (VENTOLIN HFA) 108 (90 BASE) MCG/ACT inhaler 2 puffs up to four times daily as needed for wheezing       . diclofenac sodium (VOLTAREN) 1 % GEL Apply 2 g topically 4 (four) times daily.  100 g  3  . promethazine-codeine (PHENERGAN WITH CODEINE) 6.25-10 MG/5ML syrup Take 5 mLs by mouth every 4 (four) hours as needed for cough.  300 mL  0   Current  Facility-Administered Medications on File Prior to Visit  Medication Dose Route Frequency Provider Last Rate Last Dose  . methylPREDNISolone acetate (DEPO-MEDROL) 20 MG/ML injection 10 mg  10 mg Intra-Lesional Once Tresa Garter, MD        Review of Systems:  As per HPI, otherwise negative.    Physical Examination: Filed Vitals:   02/22/13 1250  BP: 110/83  Pulse: 96  Temp: 100.1 F (37.8 C)  Resp: 19   Filed Vitals:   02/22/13 1250  Height: 5\' 8"  (1.727 m)  Weight: 220 lb (99.791 kg)   Body mass  index is 33.46 kg/(m^2). Ideal Body Weight: Weight in (lb) to have BMI = 25: 164.1  GEN: WDWN, NAD, Non-toxic, A & O x 3 HEENT: Atraumatic, Normocephalic. Neck supple. No masses, No LAD. Ears and Nose: No external deformity. CV: RRR, No M/G/R. No JVD. No thrill. No extra heart sounds. PULM: CTA B, no wheezes, crackles, rhonchi. No retractions. No resp. distress. No accessory muscle use. ABD: S, NT, ND, +BS. No rebound. No HSM. EXTR: No c/c/e NEURO Normal gait.  PSYCH: Normally interactive. Conversant. Not depressed or anxious appearing.  Calm demeanor.    Assessment and Plan: Exacerbation asthma ILI tamiflu Neb Albuterol MDI  Signed,  Phillips Odor, MD   [facility] reading (PRIMARY) by  Dr. Dareen Piano.  Chest negative.  Results for orders placed in visit on 02/22/13  POCT INFLUENZA A/B      Result Value Range   Influenza A, POC Negative     Influenza B, POC Negative

## 2013-02-22 NOTE — Patient Instructions (Signed)

## 2013-02-27 ENCOUNTER — Other Ambulatory Visit: Payer: Self-pay | Admitting: *Deleted

## 2013-02-27 MED ORDER — LEVOTHYROXINE SODIUM 200 MCG PO TABS
200.0000 ug | ORAL_TABLET | Freq: Every day | ORAL | Status: DC
Start: 2013-02-27 — End: 2013-04-30

## 2013-03-10 ENCOUNTER — Ambulatory Visit (INDEPENDENT_AMBULATORY_CARE_PROVIDER_SITE_OTHER): Payer: Managed Care, Other (non HMO) | Admitting: Endocrinology

## 2013-03-10 ENCOUNTER — Ambulatory Visit: Payer: Managed Care, Other (non HMO)

## 2013-03-10 ENCOUNTER — Encounter: Payer: Self-pay | Admitting: Endocrinology

## 2013-03-10 VITALS — BP 118/76 | HR 80 | Temp 98.6°F | Ht 68.0 in | Wt 211.0 lb

## 2013-03-10 DIAGNOSIS — E876 Hypokalemia: Secondary | ICD-10-CM

## 2013-03-10 DIAGNOSIS — E119 Type 2 diabetes mellitus without complications: Secondary | ICD-10-CM

## 2013-03-10 DIAGNOSIS — D509 Iron deficiency anemia, unspecified: Secondary | ICD-10-CM

## 2013-03-10 DIAGNOSIS — R112 Nausea with vomiting, unspecified: Secondary | ICD-10-CM

## 2013-03-10 LAB — HEMOGLOBIN A1C: HEMOGLOBIN A1C: 6.5 % (ref 4.6–6.5)

## 2013-03-10 LAB — BASIC METABOLIC PANEL
BUN: 19 mg/dL (ref 6–23)
CHLORIDE: 103 meq/L (ref 96–112)
CO2: 24 mEq/L (ref 19–32)
Calcium: 8.3 mg/dL — ABNORMAL LOW (ref 8.4–10.5)
Creatinine, Ser: 1.5 mg/dL — ABNORMAL HIGH (ref 0.4–1.2)
GFR: 38.42 mL/min — ABNORMAL LOW (ref >60.00)
GLUCOSE: 126 mg/dL — AB (ref 70–99)
POTASSIUM: 2.8 meq/L — AB (ref 3.5–5.1)
SODIUM: 139 meq/L (ref 135–145)

## 2013-03-10 LAB — IBC PANEL
IRON: 52 ug/dL (ref 42–145)
Saturation Ratios: 15.4 % — ABNORMAL LOW (ref 20.0–50.0)
Transferrin: 241.6 mg/dL (ref 212.0–360.0)

## 2013-03-10 LAB — HEPATIC FUNCTION PANEL
ALT: 43 U/L — ABNORMAL HIGH (ref 0–35)
AST: 48 U/L — ABNORMAL HIGH (ref 0–37)
Albumin: 3.9 g/dL (ref 3.5–5.2)
Alkaline Phosphatase: 50 U/L (ref 39–117)
BILIRUBIN DIRECT: 0.3 mg/dL (ref 0.0–0.3)
BILIRUBIN TOTAL: 2.1 mg/dL — AB (ref 0.3–1.2)
TOTAL PROTEIN: 7.3 g/dL (ref 6.0–8.3)

## 2013-03-10 LAB — CBC WITH DIFFERENTIAL/PLATELET
Basophils Absolute: 0 10*3/uL (ref 0.0–0.1)
Basophils Relative: 0.4 % (ref 0.0–3.0)
Eosinophils Absolute: 0.2 10*3/uL (ref 0.0–0.7)
Eosinophils Relative: 2.8 % (ref 0.0–5.0)
HEMATOCRIT: 39.9 % (ref 36.0–46.0)
HEMOGLOBIN: 13.7 g/dL (ref 12.0–15.0)
LYMPHS ABS: 2 10*3/uL (ref 0.7–4.0)
Lymphocytes Relative: 31.6 % (ref 12.0–46.0)
MCHC: 34.2 g/dL (ref 30.0–36.0)
MCV: 87 fl (ref 78.0–100.0)
MONO ABS: 0.7 10*3/uL (ref 0.1–1.0)
Monocytes Relative: 11 % (ref 3.0–12.0)
NEUTROS ABS: 3.5 10*3/uL (ref 1.4–7.7)
Neutrophils Relative %: 54.2 % (ref 43.0–77.0)
PLATELETS: 199 10*3/uL (ref 150.0–400.0)
RBC: 4.59 Mil/uL (ref 3.87–5.11)
RDW: 14.2 % (ref 11.5–14.6)
WBC: 6.4 10*3/uL (ref 4.5–10.5)

## 2013-03-10 LAB — AMYLASE: Amylase: 64 U/L (ref 27–131)

## 2013-03-10 MED ORDER — ONDANSETRON HCL 4 MG PO TABS: 4.0000 mg | ORAL_TABLET | Freq: Four times a day (QID) | ORAL | Status: DC | PRN

## 2013-03-10 MED ORDER — ALBUTEROL SULFATE HFA 108 (90 BASE) MCG/ACT IN AERS: 2.0000 | INHALATION_SPRAY | RESPIRATORY_TRACT | Status: DC | PRN

## 2013-03-10 MED ORDER — OXYCODONE HCL 10 MG PO TABS: 10.0000 mg | ORAL_TABLET | ORAL | Status: DC | PRN

## 2013-03-10 NOTE — Progress Notes (Signed)
Subjective:    Patient ID: Savannah [name], female    DOB: Sep 14, 1955, 58 y.o.   MRN: [id number]  HPI Pt states few weeks of slight intermittent nausea n/v, but no pain at the abdomen.  Over the past few days, she has had associated diarrhea.   Past Medical History  Diagnosis Date  . Unspecified hypothyroidism 01/07/2007  . 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 HYPERCHOLESTEROLEMIA 04/18/2009  . HYPOKALEMIA 01/15/2007  . ANEMIA, IRON DEFICIENCY 12/06/2008  . OBSTRUCTIVE SLEEP APNEA 08/09/2009  . HYPERTENSION 09/27/2006  . ALLERGIC RHINITIS CAUSE UNSPECIFIED 12/29/2007  . ASTHMA 09/27/2006  . FATTY LIVER DISEASE 03/19/2008  . URINARY CALCULUS 11/08/2009  . 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 BACK PAIN, LUMBAR 12/29/2009  . Palpitations 05/23/2009  . ASYMPTOMATIC POSTMENOPAUSAL STATUS 03/19/2008  . Chronic kidney disease     Past Surgical History  Procedure Laterality Date  . Knee arthroscopy Left 1993  . Cardiac catheterization  2011  . Compound fracture Right     arm    History   Social History  . Marital Status: Married    Spouse Name: N/A    Number of Children: 2  . Years of Education: N/A   Occupational History  . Homemaker    Social History Main Topics  . Smoking status: Former Smoker -- 0.30 packs/day for 2 years    Types: Cigarettes    Quit date: 02/27/1975  . Smokeless tobacco: Never Used  . Alcohol Use: No  . Drug Use: No  . Sexual Activity: Not on file   Other Topics Concern  . 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 albuterol (VENTOLIN HFA) 108 (90 BASE) MCG/ACT inhaler 2 puffs up to four times daily as needed for wheezing       . atorvastatin (LIPITOR) 40 MG tablet Take 1 tablet (40 mg total) by mouth daily.  90 tablet  3  . cholecalciferol (VITAMIN D) 1000 UNITS tablet Take 1,000 Units by mouth 2 (two) times daily.      .  clotrimazole-betamethasone (LOTRISONE) cream Apply topically 2 (two) times daily. Three times a day as needed for foot cracking  30 g  3  . diclofenac sodium (VOLTAREN) 1 % GEL Apply 2 g topically 4 (four) times daily.  100 g  3  . ferrous fumarate (HEMOCYTE - 106 MG FE) 325 (106 FE) MG TABS Take 1 tablet by mouth daily.      Marland Kitchen gabapentin (NEURONTIN) 300 MG capsule Take 300 mg by mouth at bedtime.      Marland Kitchen glucose blood (ONE TOUCH TEST STRIPS) test strip Once daily dx 250.00       . hyoscyamine (LEVSIN SL) 0.125 MG SL tablet Take 1-2 tablets by mouth or under tongue every 4 hours as needed.  100 tablet  5  . levothyroxine (SYNTHROID, LEVOTHROID) 200 MCG tablet Take 1 tablet (200 mcg total) by mouth daily.  90 tablet  1  . losartan-hydrochlorothiazide (HYZAAR) 100-12.5 MG per tablet TAKE 1 TABLET DAILY  90 tablet  3  . meclizine (ANTIVERT) 12.5 MG tablet Take 1 tablet (12.5 mg total) by mouth 3 (three) times daily as needed for dizziness. 1-2 by mouth three times a day as needed  50 tablet  3  . metFORMIN (GLUCOPHAGE) 500 MG tablet Take 500 mg by mouth 2 (two) times  daily with a meal.      . metoprolol succinate (TOPROL-XL) 25 MG 24 hr tablet TAKE 1 TABLET DAILY  90 tablet  3  . omeprazole (PRILOSEC) 40 MG capsule Take 1 capsule (40 mg total) by mouth 2 (two) times daily.  180 capsule  3  . pioglitazone (ACTOS) 15 MG tablet Take 1 tablet (15 mg total) by mouth daily.  90 tablet  3  . promethazine-codeine (PHENERGAN WITH CODEINE) 6.25-10 MG/5ML syrup Take 5 mLs by mouth every 4 (four) hours as needed for cough.  300 mL  0  . traZODone (DESYREL) 100 MG tablet Take 1 tablet (100 mg total) by mouth at bedtime.  90 tablet  6   No current facility-administered medications on file prior to visit.    Allergies  Allergen Reactions  . Erythromycin     REACTION: nausea/vomiting    Family History  Problem Relation Age of Onset  . Colon cancer Mother 28  . Diabetes Mother   . Kidney disease Mother   .  Heart attack Father   . Heart disease Father   . Allergies Sister   . Asthma Sister   . Heart disease Paternal Grandfather   . Clotting disorder Grandchild     BP 118/76  Pulse 80  Temp(Src) 98.6 F (37 C) (Oral)  Ht 5\' 8"  (1.727 m)  Wt 211 lb (95.709 kg)  BMI 32.09 kg/m2  SpO2 98%  Review of Systems She has lost a few lbs.  She has low-grade temp (99.1).      Objective:   Physical Exam VITAL SIGNS:  See vs page GENERAL: no distress ABDOMEN: abdomen is soft, nontender.  no hepatosplenomegaly.   not distended.  no hernia   Lab Results  Component Value Date   WBC 6.4 03/10/2013   HGB 13.7 03/10/2013   HCT 39.9 03/10/2013   PLT 199.0 03/10/2013   GLUCOSE 126* 03/10/2013   CHOL 206* 08/01/2012   TRIG 245.0* 08/01/2012   HDL 35.80* 08/01/2012   LDLDIRECT 135.7 08/01/2012   LDLCALC 45 05/09/2010   ALT 43* 03/10/2013   AST 48* 03/10/2013   NA 139 03/10/2013   K 2.8* 03/10/2013   CL 103 03/10/2013   CREATININE 1.5* 03/10/2013   BUN 19 03/10/2013   CO2 24 03/10/2013   TSH 0.67 08/01/2012   INR 1.1 ratio* 05/23/2009   HGBA1C 6.5 03/10/2013   MICROALBUR 5.0 Repeated and verified X2.* 08/01/2012      Assessment & Plan:  Vomiting, new, prob viral Hypokalemia, due to vomiting NASH, recurrent. Vit-d deficiency.  She needs increased rx

## 2013-03-10 NOTE — Patient Instructions (Addendum)
i have sent a prescription to your pharmacy, to refill the anti-nausea.   blood tests are being requested for you today.  We'll contact you with results.  I hope you feel better soon.  If you don't feel better by next week, please call back.  Please call sooner if you get worse.

## 2013-03-11 ENCOUNTER — Other Ambulatory Visit: Payer: Self-pay | Admitting: Endocrinology

## 2013-03-11 DIAGNOSIS — E876 Hypokalemia: Secondary | ICD-10-CM | POA: Insufficient documentation

## 2013-03-11 MED ORDER — POTASSIUM CHLORIDE CRYS ER 20 MEQ PO TBCR: 20.0000 meq | EXTENDED_RELEASE_TABLET | Freq: Two times a day (BID) | ORAL | Status: DC

## 2013-03-16 ENCOUNTER — Other Ambulatory Visit (INDEPENDENT_AMBULATORY_CARE_PROVIDER_SITE_OTHER): Payer: Managed Care, Other (non HMO)

## 2013-03-16 DIAGNOSIS — E876 Hypokalemia: Secondary | ICD-10-CM

## 2013-03-16 LAB — BASIC METABOLIC PANEL
BUN: 12 mg/dL (ref 6–23)
CALCIUM: 8.7 mg/dL (ref 8.4–10.5)
CHLORIDE: 105 meq/L (ref 96–112)
CO2: 28 meq/L (ref 19–32)
Creatinine, Ser: 1.1 mg/dL (ref 0.4–1.2)
GFR: 56.47 mL/min — ABNORMAL LOW (ref >60.00)
GLUCOSE: 202 mg/dL — AB (ref 70–99)
Potassium: 3.7 mEq/L (ref 3.5–5.1)
Sodium: 140 mEq/L (ref 135–145)

## 2013-03-17 ENCOUNTER — Other Ambulatory Visit: Payer: Self-pay

## 2013-03-17 MED ORDER — TRAZODONE HCL 100 MG PO TABS: 100.0000 mg | ORAL_TABLET | Freq: Every day | ORAL | Status: DC

## 2013-03-24 ENCOUNTER — Encounter: Payer: Self-pay | Admitting: Gastroenterology

## 2013-04-01 ENCOUNTER — Other Ambulatory Visit: Payer: Self-pay | Admitting: Endocrinology

## 2013-04-10 ENCOUNTER — Encounter: Payer: Self-pay | Admitting: Endocrinology

## 2013-04-10 LAB — HM DIABETES EYE EXAM

## 2013-04-17 ENCOUNTER — Encounter: Payer: Self-pay | Admitting: Gastroenterology

## 2013-04-30 ENCOUNTER — Ambulatory Visit (INDEPENDENT_AMBULATORY_CARE_PROVIDER_SITE_OTHER): Payer: Managed Care, Other (non HMO) | Admitting: Endocrinology

## 2013-04-30 ENCOUNTER — Encounter: Payer: Self-pay | Admitting: Endocrinology

## 2013-04-30 VITALS — BP 122/80 | HR 107 | Temp 98.6°F | Ht 68.0 in | Wt 216.0 lb

## 2013-04-30 DIAGNOSIS — J209 Acute bronchitis, unspecified: Secondary | ICD-10-CM

## 2013-04-30 MED ORDER — OXYCODONE HCL 10 MG PO TABS: 10.0000 mg | ORAL_TABLET | ORAL | Status: DC | PRN

## 2013-04-30 MED ORDER — LEVOTHYROXINE SODIUM 200 MCG PO TABS: 200.0000 ug | ORAL_TABLET | Freq: Every day | ORAL | Status: DC

## 2013-04-30 MED ORDER — CEFUROXIME AXETIL 250 MG PO TABS: 250.0000 mg | ORAL_TABLET | Freq: Two times a day (BID) | ORAL | Status: AC

## 2013-04-30 MED ORDER — FLUTICASONE-SALMETEROL 100-50 MCG/DOSE IN AEPB: 1.0000 | INHALATION_SPRAY | Freq: Two times a day (BID) | RESPIRATORY_TRACT | Status: DC

## 2013-04-30 NOTE — Progress Notes (Signed)
Subjective:    Patient ID: Savannah [name], female    DOB: 12-19-1955, 58 y.o.   MRN: [id number]  HPI Pt states few weeks of moderate prod-quality cough in the chest, and assoc wheezing.  Past Medical History  Diagnosis Date  . Unspecified hypothyroidism 01/07/2007  . 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 HYPERCHOLESTEROLEMIA 04/18/2009  . HYPOKALEMIA 01/15/2007  . ANEMIA, IRON DEFICIENCY 12/06/2008  . OBSTRUCTIVE SLEEP APNEA 08/09/2009  . HYPERTENSION 09/27/2006  . ALLERGIC RHINITIS CAUSE UNSPECIFIED 12/29/2007  . ASTHMA 09/27/2006  . FATTY LIVER DISEASE 03/19/2008  . URINARY CALCULUS 11/08/2009  . 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 BACK PAIN, LUMBAR 12/29/2009  . Palpitations 05/23/2009  . ASYMPTOMATIC POSTMENOPAUSAL STATUS 03/19/2008  . Chronic kidney disease     Past Surgical History  Procedure Laterality Date  . Knee arthroscopy Left 1993  . Cardiac catheterization  2011  . Compound fracture Right     arm    History   Social History  . Marital Status: Married    Spouse Name: N/A    Number of Children: 2  . Years of Education: N/A   Occupational History  . Homemaker    Social History Main Topics  . Smoking status: Former Smoker -- 0.30 packs/day for 2 years    Types: Cigarettes    Quit date: 02/27/1975  . Smokeless tobacco: Never Used  . Alcohol Use: No  . Drug Use: No  . Sexual Activity: Not on file   Other Topics Concern  . 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 albuterol (PROVENTIL HFA;VENTOLIN HFA) 108 (90 BASE) MCG/ACT inhaler Inhale 2 puffs into the lungs every 4 (four) hours as needed for wheezing or shortness of breath (cough, shortness of breath or wheezing.).  1 Inhaler  12  . albuterol (VENTOLIN HFA) 108 (90 BASE) MCG/ACT inhaler 2 puffs up to four times daily as needed for wheezing       . atorvastatin (LIPITOR) 40 MG  tablet Take 1 tablet (40 mg total) by mouth daily.  90 tablet  3  . cholecalciferol (VITAMIN D) 1000 UNITS tablet Take 1,000 Units by mouth 2 (two) times daily.      . citalopram (CELEXA) 40 MG tablet TAKE ONE TABLET BY MOUTH ONCE DAILY  90 tablet  0  . clotrimazole-betamethasone (LOTRISONE) cream Apply topically 2 (two) times daily. Three times a day as needed for foot cracking  30 g  3  . diclofenac sodium (VOLTAREN) 1 % GEL Apply 2 g topically 4 (four) times daily.  100 g  3  . ferrous fumarate (HEMOCYTE - 106 MG FE) 325 (106 FE) MG TABS Take 1 tablet by mouth daily.      Marland Kitchen gabapentin (NEURONTIN) 300 MG capsule Take 300 mg by mouth at bedtime.      Marland Kitchen glucose blood (ONE TOUCH TEST STRIPS) test strip Once daily dx 250.00       . hyoscyamine (LEVSIN SL) 0.125 MG SL tablet Take 1-2 tablets by mouth or under tongue every 4 hours as needed.  100 tablet  5  . losartan-hydrochlorothiazide (HYZAAR) 100-12.5 MG per tablet TAKE 1 TABLET DAILY  90 tablet  2  . meclizine (ANTIVERT) 12.5 MG tablet Take 1 tablet (12.5 mg total) by mouth 3 (three) times daily as needed for dizziness. 1-2 by mouth three times  a day as needed  50 tablet  3  . metFORMIN (GLUCOPHAGE) 500 MG tablet Take 500 mg by mouth 2 (two) times daily with a meal.      . metoprolol succinate (TOPROL-XL) 25 MG 24 hr tablet TAKE 1 TABLET DAILY  90 tablet  3  . omeprazole (PRILOSEC) 40 MG capsule Take 1 capsule (40 mg total) by mouth 2 (two) times daily.  180 capsule  3  . ondansetron (ZOFRAN) 4 MG tablet Take 1 tablet (4 mg total) by mouth every 6 (six) hours as needed for nausea.  30 tablet  1  . pioglitazone (ACTOS) 15 MG tablet Take 1 tablet (15 mg total) by mouth daily.  90 tablet  3  . potassium chloride SA (K-DUR,KLOR-CON) 20 MEQ tablet Take 1 tablet (20 mEq total) by mouth 2 (two) times daily.  10 tablet  0  . promethazine-codeine (PHENERGAN WITH CODEINE) 6.25-10 MG/5ML syrup Take 5 mLs by mouth every 4 (four) hours as needed for cough.  300  mL  0  . traZODone (DESYREL) 100 MG tablet Take 1 tablet (100 mg total) by mouth at bedtime.  90 tablet  6   No current facility-administered medications on file prior to visit.    Allergies  Allergen Reactions  . Erythromycin     REACTION: nausea/vomiting    Family History  Problem Relation Age of Onset  . Colon cancer Mother 56  . Diabetes Mother   . Kidney disease Mother   . Heart attack Father   . Heart disease Father   . Allergies Sister   . Asthma Sister   . Heart disease Paternal Grandfather   . Clotting disorder Grandchild     BP 122/80  Pulse 107  Temp(Src) 98.6 F (37 C) (Oral)  Ht 5\' 8"  (1.727 m)  Wt 216 lb (97.977 kg)  BMI 32.85 kg/m2  SpO2 98%   Review of Systems Denies fever and earache.  She has nasal congestion.    Objective:   Physical Exam VITAL SIGNS:  See vs page GENERAL: no distress head: no deformity eyes: no periorbital swelling, no proptosis external nose and ears are normal mouth: no lesion seen Both tm's are red LUNGS:  Clear to auscultation      Assessment & Plan:  Acute bronchitis, new

## 2013-04-30 NOTE — Patient Instructions (Signed)
i have sent 2 prescriptions to your pharmacy (inhaler and antibiotic) I hope you feel better soon.  If you don't feel better by next week, please call back.  Please call sooner if you get worse.

## 2013-05-05 ENCOUNTER — Other Ambulatory Visit: Payer: Self-pay

## 2013-05-05 DIAGNOSIS — Z1231 Encounter for screening mammogram for malignant neoplasm of breast: Secondary | ICD-10-CM

## 2013-05-13 ENCOUNTER — Telehealth: Payer: Self-pay | Admitting: Gastroenterology

## 2013-05-13 ENCOUNTER — Telehealth: Payer: Self-pay | Admitting: Endocrinology

## 2013-05-13 ENCOUNTER — Encounter: Payer: Self-pay | Admitting: Gastroenterology

## 2013-05-13 MED ORDER — MOMETASONE FURO-FORMOTEROL FUM 100-5 MCG/ACT IN AERO: 2.0000 | INHALATION_SPRAY | Freq: Two times a day (BID) | RESPIRATORY_TRACT | Status: DC

## 2013-05-13 NOTE — Telephone Encounter (Signed)
Pt informed

## 2013-05-13 NOTE — Telephone Encounter (Signed)
please call patient: Insurance wants you to change advair to dulera.  i have sent a prescription to your pharmacy

## 2013-05-13 NOTE — Telephone Encounter (Signed)
Pt scheduled  

## 2013-05-13 NOTE — Telephone Encounter (Signed)
Yes ok to set up Endo/colon with pre-visit.  Please help her schedule.

## 2013-05-16 ENCOUNTER — Other Ambulatory Visit: Payer: Self-pay | Admitting: Endocrinology

## 2013-05-19 ENCOUNTER — Telehealth: Payer: Self-pay | Admitting: Endocrinology

## 2013-05-19 MED ORDER — MOMETASONE FURO-FORMOTEROL FUM 100-5 MCG/ACT IN AERO: 2.0000 | INHALATION_SPRAY | Freq: Two times a day (BID) | RESPIRATORY_TRACT | Status: DC

## 2013-05-19 NOTE — Telephone Encounter (Signed)
Prior auth regarding dulera rx they did not received the script

## 2013-05-19 NOTE — Telephone Encounter (Signed)
Medication resent

## 2013-06-08 ENCOUNTER — Ambulatory Visit (INDEPENDENT_AMBULATORY_CARE_PROVIDER_SITE_OTHER): Payer: Managed Care, Other (non HMO) | Admitting: Endocrinology

## 2013-06-08 ENCOUNTER — Encounter: Payer: Self-pay | Admitting: Endocrinology

## 2013-06-08 VITALS — BP 118/72 | HR 76 | Temp 97.5°F | Ht 68.0 in | Wt 218.0 lb

## 2013-06-08 DIAGNOSIS — J309 Allergic rhinitis, unspecified: Secondary | ICD-10-CM

## 2013-06-08 MED ORDER — BENZONATATE 100 MG PO CAPS: 100.0000 mg | ORAL_CAPSULE | Freq: Three times a day (TID) | ORAL | Status: DC | PRN

## 2013-06-08 MED ORDER — OXYCODONE HCL 10 MG PO TABS: 10.0000 mg | ORAL_TABLET | ORAL | Status: DC | PRN

## 2013-06-08 MED ORDER — CEFUROXIME AXETIL 250 MG PO TABS: 250.0000 mg | ORAL_TABLET | Freq: Two times a day (BID) | ORAL | Status: DC

## 2013-06-08 NOTE — Progress Notes (Signed)
Subjective:    Patient ID: Savannah [name], female    DOB: [birth year], 58 y.o.   MRN: [id number]  HPI Pt states 2 weeks of moderate prod-quality cough in the chest, and assoc bilat otalgia.   Past Medical History  Diagnosis Date  . Unspecified hypothyroidism 01/07/2007  . 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 HYPERCHOLESTEROLEMIA 04/18/2009  . HYPOKALEMIA 01/15/2007  . ANEMIA, IRON DEFICIENCY 12/06/2008  . OBSTRUCTIVE SLEEP APNEA 08/09/2009  . HYPERTENSION 09/27/2006  . ALLERGIC RHINITIS CAUSE UNSPECIFIED 12/29/2007  . ASTHMA 09/27/2006  . FATTY LIVER DISEASE 03/19/2008  . URINARY CALCULUS 11/08/2009  . 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 BACK PAIN, LUMBAR 12/29/2009  . Palpitations 05/23/2009  . ASYMPTOMATIC POSTMENOPAUSAL STATUS 03/19/2008  . Chronic kidney disease     Past Surgical History  Procedure Laterality Date  . Knee arthroscopy Left 1993  . Cardiac catheterization  2011  . Compound fracture Right     arm    History   Social History  . Marital Status: Married    Spouse Name: N/A    Number of Children: 2  . Years of Education: N/A   Occupational History  . Homemaker    Social History Main Topics  . Smoking status: Former Smoker -- 0.30 packs/day for 2 years    Types: Cigarettes    Quit date: 02/27/1975  . Smokeless tobacco: Never Used  . Alcohol Use: No  . Drug Use: No  . Sexual Activity: Not on file   Other Topics Concern  . 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 albuterol (PROVENTIL HFA;VENTOLIN HFA) 108 (90 BASE) MCG/ACT inhaler Inhale 2 puffs into the lungs every 4 (four) hours as needed for wheezing or shortness of breath (cough, shortness of breath or wheezing.).  1 Inhaler  12  . albuterol (VENTOLIN HFA) 108 (90 BASE) MCG/ACT inhaler 2 puffs up to four times daily as needed for wheezing       . atorvastatin (LIPITOR) 40  MG tablet Take 1 tablet (40 mg total) by mouth daily.  90 tablet  3  . cholecalciferol (VITAMIN D) 1000 UNITS tablet Take 1,000 Units by mouth 2 (two) times daily.      . citalopram (CELEXA) 40 MG tablet TAKE ONE TABLET BY MOUTH ONCE DAILY  90 tablet  0  . clotrimazole-betamethasone (LOTRISONE) cream Apply topically 2 (two) times daily. Three times a day as needed for foot cracking  30 g  3  . diclofenac sodium (VOLTAREN) 1 % GEL Apply 2 g topically 4 (four) times daily.  100 g  3  . ferrous fumarate (HEMOCYTE - 106 MG FE) 325 (106 FE) MG TABS Take 1 tablet by mouth daily.      Marland Kitchen gabapentin (NEURONTIN) 300 MG capsule Take 300 mg by mouth at bedtime.      Marland Kitchen glucose blood (ONE TOUCH TEST STRIPS) test strip Once daily dx 250.00       . hyoscyamine (LEVSIN SL) 0.125 MG SL tablet Take 1-2 tablets by mouth or under tongue every 4 hours as needed.  100 tablet  5  . levothyroxine (SYNTHROID, LEVOTHROID) 200 MCG tablet Take 1 tablet (200 mcg total) by mouth daily.  90 tablet  1  . losartan-hydrochlorothiazide (HYZAAR) 100-12.5 MG per tablet TAKE 1 TABLET DAILY  90 tablet  2  . meclizine (ANTIVERT)  12.5 MG tablet Take 1 tablet (12.5 mg total) by mouth 3 (three) times daily as needed for dizziness. 1-2 by mouth three times a day as needed  50 tablet  3  . metFORMIN (GLUCOPHAGE) 500 MG tablet Take 500 mg by mouth 2 (two) times daily with a meal.      . metoprolol succinate (TOPROL-XL) 25 MG 24 hr tablet TAKE 1 TABLET DAILY  90 tablet  2  . mometasone-formoterol (DULERA) 100-5 MCG/ACT AERO Inhale 2 puffs into the lungs 2 (two) times daily.  1 Inhaler  11  . omeprazole (PRILOSEC) 40 MG capsule Take 1 capsule (40 mg total) by mouth 2 (two) times daily.  180 capsule  3  . ondansetron (ZOFRAN) 4 MG tablet Take 1 tablet (4 mg total) by mouth every 6 (six) hours as needed for nausea.  30 tablet  1  . pioglitazone (ACTOS) 15 MG tablet Take 1 tablet (15 mg total) by mouth daily.  90 tablet  3  . potassium chloride SA  (K-DUR,KLOR-CON) 20 MEQ tablet Take 1 tablet (20 mEq total) by mouth 2 (two) times daily.  10 tablet  0  . traZODone (DESYREL) 100 MG tablet Take 1 tablet (100 mg total) by mouth at bedtime.  90 tablet  6   No current facility-administered medications on file prior to visit.    Allergies  Allergen Reactions  . Erythromycin     REACTION: nausea/vomiting    Family History  Problem Relation Age of Onset  . Colon cancer Mother 49  . Diabetes Mother   . Kidney disease Mother   . Heart attack Father   . Heart disease Father   . Allergies Sister   . Asthma Sister   . Heart disease Paternal Grandfather   . Clotting disorder Grandchild     BP 118/72  Pulse 76  Temp(Src) 97.5 F (36.4 C) (Oral)  Ht 5\' 8"  (1.727 m)  Wt 218 lb (98.884 kg)  BMI 33.15 kg/m2  SpO2 96%  Review of Systems Low-back pain has recurred, after injections by dr Nelva Bush.  She also has nasal congestion.      Objective:   Physical Exam VITAL SIGNS:  See vs page GENERAL: no distress head: no deformity eyes: no periorbital swelling, no proptosis external nose and ears are normal mouth: no lesion seen Both tm's are red. LUNGS:  Clear to auscultation   outside records are reviewed: 2014 back injections by dr Nelva Bush    Assessment & Plan:  bilat AOM, new Allergic rhinitis, persistent Low-back pain, recurrent

## 2013-06-08 NOTE — Patient Instructions (Addendum)
i have sent 2 prescriptions to your pharmacy: antibiotic and cough pill.   allegra-d (non-prescription) will help your congestion.  Here is a refill of your pain medication.

## 2013-06-11 ENCOUNTER — Ambulatory Visit
Admission: RE | Admit: 2013-06-11 | Discharge: 2013-06-11 | Disposition: A | Discharge status: Home / Self Care | Payer: Commercial Indemnity | Source: Ambulatory Visit

## 2013-06-11 DIAGNOSIS — Z1231 Encounter for screening mammogram for malignant neoplasm of breast: Secondary | ICD-10-CM

## 2013-06-15 ENCOUNTER — Other Ambulatory Visit: Payer: Self-pay | Admitting: Endocrinology

## 2013-06-17 ENCOUNTER — Telehealth: Payer: Self-pay | Admitting: Endocrinology

## 2013-06-17 MED ORDER — METOPROLOL SUCCINATE ER 25 MG PO TB24: ORAL_TABLET | ORAL | Status: DC

## 2013-06-17 MED ORDER — LEVOFLOXACIN 500 MG PO TABS: 500.0000 mg | ORAL_TABLET | Freq: Every day | ORAL | Status: DC

## 2013-06-17 NOTE — Telephone Encounter (Signed)
Call in metoprolol she is completely out  Please call in stronger abx she is still with symptoms of ear infection

## 2013-06-17 NOTE — Telephone Encounter (Signed)
i have sent a prescription to your pharmacy. Please take just 1/2 of the citalopram while on this, due to an interaction. If this does not help, i would be happy to refer you to a specialist.

## 2013-06-17 NOTE — Telephone Encounter (Signed)
Pt called stating she is still having ear aches and nasal congestion. She wanted to know if something else could be given for these issues.  Please advise, Thanks!

## 2013-06-18 NOTE — Telephone Encounter (Signed)
Requested call back.  

## 2013-06-18 NOTE — Telephone Encounter (Signed)
Pt informed

## 2013-06-25 ENCOUNTER — Ambulatory Visit (AMBULATORY_SURGERY_CENTER): Payer: Self-pay

## 2013-06-25 VITALS — Ht 67.5 in | Wt 225.0 lb

## 2013-06-25 DIAGNOSIS — Z800 Family history of malignant neoplasm of digestive organs: Secondary | ICD-10-CM

## 2013-06-25 NOTE — Progress Notes (Signed)
Pt requested Miralax prep due to cost of Rx too high.

## 2013-06-25 NOTE — Progress Notes (Signed)
No allergies to eggs or soy No home oxygen No diet/weight loss meds Has email. Emmi instructions given for colonoscopy No past problems with anesthesia. 

## 2013-06-26 NOTE — Telephone Encounter (Signed)
Wrong chart

## 2013-06-29 ENCOUNTER — Other Ambulatory Visit: Payer: Self-pay | Admitting: Endocrinology

## 2013-06-29 ENCOUNTER — Encounter: Payer: Self-pay | Admitting: Gastroenterology

## 2013-07-02 ENCOUNTER — Ambulatory Visit: Payer: BC Managed Care – PPO | Admitting: Pulmonary Disease

## 2013-07-09 ENCOUNTER — Encounter: Payer: Commercial Indemnity | Admitting: Gastroenterology

## 2013-07-21 ENCOUNTER — Ambulatory Visit: Payer: BC Managed Care – PPO

## 2013-07-22 ENCOUNTER — Ambulatory Visit: Payer: BC Managed Care – PPO

## 2013-07-22 ENCOUNTER — Encounter: Payer: Self-pay | Admitting: Pulmonary Disease

## 2013-07-22 ENCOUNTER — Ambulatory Visit (INDEPENDENT_AMBULATORY_CARE_PROVIDER_SITE_OTHER): Payer: Commercial Indemnity | Admitting: Pulmonary Disease

## 2013-07-22 VITALS — BP 122/84 | HR 90 | Ht 67.5 in | Wt 219.0 lb

## 2013-07-22 DIAGNOSIS — G4733 Obstructive sleep apnea (adult) (pediatric): Secondary | ICD-10-CM

## 2013-07-22 NOTE — Progress Notes (Signed)
   Subjective:    Patient ID: Savannah [name], female    DOB: 07-18-1955, 58 y.o.   MRN: [id number]  HPI The patient comes in today for followup of her obstructive sleep apnea. She is wearing CPAP compliantly, and is having no issues with her mask fit or pressure. She is overdue for new supplies. She feels that she sleeps well with the device, and is satisfied with her daytime alertness. She has continued to lose weight, and is down another 10 pounds.  Review of Systems  Constitutional: Negative for fever and unexpected weight change.  HENT: Negative for congestion, dental problem, ear pain, nosebleeds, postnasal drip, rhinorrhea, sinus pressure, sneezing, sore throat and trouble swallowing.   Eyes: Negative for redness and itching.  Respiratory: Negative for cough, chest tightness, shortness of breath and wheezing.   Cardiovascular: Negative for palpitations and leg swelling.  Gastrointestinal: Negative for nausea and vomiting.  Genitourinary: Negative for dysuria.  Musculoskeletal: Negative for joint swelling.  Skin: Negative for rash.  Neurological: Negative for headaches.  Hematological: Does not bruise/bleed easily.  Psychiatric/Behavioral: Negative for dysphoric mood. The patient is not nervous/anxious.        Objective:   Physical Exam Overweight female in no acute distress Nose without purulence or discharge noted No skin breakdown or pressure necrosis from the CPAP mask Neck without lymphadenopathy or thyromegaly Lower extremities without edema, no cyanosis Alert and oriented, moves all 4 extremities.       Assessment & Plan:

## 2013-07-22 NOTE — Assessment & Plan Note (Signed)
The patient is doing very well on CPAP, and is satisfied with her sleep and daytime alertness. She is continuing to lose weight, and I've encouraged her to keep doing so. Will send an order to her home care company for new supplies, and will see her back in one year if doing well.

## 2013-07-22 NOTE — Patient Instructions (Signed)
Continue with cpap, and keep up with mask changes and supplies.  Will send an order to your homecare company Keep working on weight loss.  You are doing great. followup with me again in one year.

## 2013-07-27 ENCOUNTER — Ambulatory Visit (INDEPENDENT_AMBULATORY_CARE_PROVIDER_SITE_OTHER): Payer: Managed Care, Other (non HMO)

## 2013-07-27 ENCOUNTER — Ambulatory Visit: Payer: BC Managed Care – PPO

## 2013-07-27 VITALS — BP 120/58 | HR 86 | Resp 15 | Ht 67.5 in | Wt 219.0 lb

## 2013-07-27 DIAGNOSIS — R52 Pain, unspecified: Secondary | ICD-10-CM

## 2013-07-27 DIAGNOSIS — E1140 Type 2 diabetes mellitus with diabetic neuropathy, unspecified: Secondary | ICD-10-CM

## 2013-07-27 DIAGNOSIS — M2040 Other hammer toe(s) (acquired), unspecified foot: Secondary | ICD-10-CM

## 2013-07-27 DIAGNOSIS — M2030 Hallux varus (acquired), unspecified foot: Secondary | ICD-10-CM

## 2013-07-27 DIAGNOSIS — M1990 Unspecified osteoarthritis, unspecified site: Secondary | ICD-10-CM

## 2013-07-27 DIAGNOSIS — E1142 Type 2 diabetes mellitus with diabetic polyneuropathy: Secondary | ICD-10-CM

## 2013-07-27 DIAGNOSIS — R269 Unspecified abnormalities of gait and mobility: Secondary | ICD-10-CM

## 2013-07-27 DIAGNOSIS — E1149 Type 2 diabetes mellitus with other diabetic neurological complication: Secondary | ICD-10-CM

## 2013-07-27 MED ORDER — GABAPENTIN 300 MG PO CAPS: 300.0000 mg | ORAL_CAPSULE | Freq: Every day | ORAL | Status: DC

## 2013-07-27 NOTE — Patient Instructions (Signed)
Diabetes and Foot Care Diabetes may cause you to have problems because of poor blood supply (circulation) to your feet and legs. This may cause the skin on your feet to become thinner, break easier, and heal more slowly. Your skin may become dry, and the skin may peel and crack. You may also have nerve damage in your legs and feet causing decreased feeling in them. You may not notice minor injuries to your feet that could lead to infections or more serious problems. Taking care of your feet is one of the most important things you can do for yourself.  HOME CARE INSTRUCTIONS  Wear shoes at all times, even in the house. Do not go barefoot. Bare feet are easily injured.  Check your feet daily for blisters, cuts, and redness. If you cannot see the bottom of your feet, use a mirror or ask someone for help.  Wash your feet with warm water (do not use hot water) and mild soap. Then pat your feet and the areas between your toes until they are completely dry. Do not soak your feet as this can dry your skin.  Apply a moisturizing lotion or petroleum jelly (that does not contain alcohol and is unscented) to the skin on your feet and to dry, brittle toenails. Do not apply lotion between your toes.  Trim your toenails straight across. Do not dig under them or around the cuticle. File the edges of your nails with an emery board or nail file.  Do not cut corns or calluses or try to remove them with medicine.  Wear clean socks or stockings every day. Make sure they are not too tight. Do not wear knee-high stockings since they may decrease blood flow to your legs.  Wear shoes that fit properly and have enough cushioning. To break in new shoes, wear them for just a few hours a day. This prevents you from injuring your feet. Always look in your shoes before you put them on to be sure there are no objects inside.  Do not cross your legs. This may decrease the blood flow to your feet.  If you find a minor scrape,  cut, or break in the skin on your feet, keep it and the skin around it clean and dry. These areas may be cleansed with mild soap and water. Do not cleanse the area with peroxide, alcohol, or iodine.  When you remove an adhesive bandage, be sure not to damage the skin around it.  If you have a wound, look at it several times a day to make sure it is healing.  Do not use heating pads or hot water bottles. They may burn your skin. If you have lost feeling in your feet or legs, you may not know it is happening until it is too late.  Make sure your health care provider performs a complete foot exam at least annually or more often if you have foot problems. Report any cuts, sores, or bruises to your health care provider immediately. SEEK MEDICAL CARE IF:   You have an injury that is not healing.  You have cuts or breaks in the skin.  You have an ingrown nail.  You notice redness on your legs or feet.  You feel burning or tingling in your legs or feet.  You have pain or cramps in your legs and feet.  Your legs or feet are numb.  Your feet always feel cold. SEEK IMMEDIATE MEDICAL CARE IF:   There is increasing redness,   swelling, or pain in or around a wound.  There is a red line that goes up your leg.  Pus is coming from a wound.  You develop a fever or as directed by your health care provider.  You notice a bad smell coming from an ulcer or wound. Document Released: 02/10/2000 Document Revised: 10/15/2012 Document Reviewed: 07/22/2012 [employer] Patient Information 2014 [employer].  

## 2013-07-27 NOTE — Progress Notes (Signed)
   Subjective:    Patient ID: Savannah [name], female    DOB: [birth year], 58 y.o.   MRN: [id number]  HPI Comments: N foot pain L left 1st MPJ medial D 1 year O  C soreness A weightbearing, worse at night - sharp pains T none  Pt states her right 2nd toe, the bending in the toe makes it be in the way and it is painful all the time.  "He said I have this severed achilles and that's why my toes are this way."  Foot Pain  Toe Pain       Review of Systems  Respiratory: Positive for apnea and wheezing.        Sleep apnea - CPAP tx  Gastrointestinal:       GI ulcer treating with Dr Fuller Plan.  Musculoskeletal: Positive for back pain and gait problem.       Calf pain with walking.  Hematological: Bruises/bleeds easily.  All other systems reviewed and are negative.      Objective:   Physical Exam Lower extremity objective findings as follows vascular status is intact pedal pulses palpable DP +2/4 bilateral PT plus one over 4 bilateral capillary refill time 3 seconds all digits epicritic and proprioceptive sensations are intact although diminished on Semmes Weinstein testing to both feet patient also has burning and paresthesias she was identified now or has any for long time and request restarting her gabapentin treatments as well. 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 There is hemorrhage a keratoses or pre-ulcer the hallux which is debrided at this time patient is dispensed 1 silicone crest pad also given literature about hammertoe repair would benefit from possible hallux IP joint fusion as well as hammertoe repair with digital fusions 234 and possible  derotation arthroplasty fifth toe which are a hammertoe in the hallux hammertoe repair given to patient she'll consider this report with him next month for possible surgical       Assessment & Plan:  Assessment diabetes with history peripheral neuropathy gait abnormality and rigid digital contractures as result of the neuropathy deformity and gait abnormality. Patient is keratotic lesion debridement at this time buttress pad is dispensed to cushion the toes dispensed literature about hammertoe repair and hallux interphalangeus IP joint fusion at some point in the future reappointed for consult in one month in the interim maintain accommodative padding and toe cushioning as well as recommended accommodative shoes and socks no barefoot or flimsy shoes or flip-flops patient will followup with more appropriate shoe in the future  Harriet Masson DPM

## 2013-08-06 ENCOUNTER — Telehealth: Payer: Self-pay

## 2013-08-06 NOTE — Telephone Encounter (Signed)
Ov at 2 PM tomorrow

## 2013-08-06 NOTE — Telephone Encounter (Signed)
Pt called stating that she would like to have an office visit before Dr. Loanne Drilling goes on vacation. Pt sates that she has paper work that needs to be filled out for a health screen and needs to discuss meds.  Can we add her to Friday's schedule? Thanks!

## 2013-08-06 NOTE — Telephone Encounter (Signed)
Pt advised, coming tomorrow.

## 2013-08-07 ENCOUNTER — Ambulatory Visit (INDEPENDENT_AMBULATORY_CARE_PROVIDER_SITE_OTHER): Payer: Commercial Indemnity | Admitting: Endocrinology

## 2013-08-07 ENCOUNTER — Encounter: Payer: Self-pay | Admitting: Endocrinology

## 2013-08-07 VITALS — BP 122/88 | HR 76 | Temp 98.5°F | Ht 67.5 in | Wt 219.0 lb

## 2013-08-07 DIAGNOSIS — K7689 Other specified diseases of liver: Secondary | ICD-10-CM

## 2013-08-07 DIAGNOSIS — R319 Hematuria, unspecified: Secondary | ICD-10-CM

## 2013-08-07 DIAGNOSIS — E039 Hypothyroidism, unspecified: Secondary | ICD-10-CM

## 2013-08-07 DIAGNOSIS — E7800 Pure hypercholesterolemia, unspecified: Secondary | ICD-10-CM

## 2013-08-07 DIAGNOSIS — E119 Type 2 diabetes mellitus without complications: Secondary | ICD-10-CM

## 2013-08-07 DIAGNOSIS — N209 Urinary calculus, unspecified: Secondary | ICD-10-CM

## 2013-08-07 DIAGNOSIS — D509 Iron deficiency anemia, unspecified: Secondary | ICD-10-CM

## 2013-08-07 DIAGNOSIS — E559 Vitamin D deficiency, unspecified: Secondary | ICD-10-CM

## 2013-08-07 DIAGNOSIS — Z0000 Encounter for general adult medical examination without abnormal findings: Secondary | ICD-10-CM

## 2013-08-07 DIAGNOSIS — N289 Disorder of kidney and ureter, unspecified: Secondary | ICD-10-CM

## 2013-08-07 LAB — URINALYSIS, ROUTINE W REFLEX MICROSCOPIC
Bilirubin Urine: NEGATIVE
Ketones, ur: NEGATIVE
Nitrite: NEGATIVE
SPECIFIC GRAVITY, URINE: 1.02 (ref 1.000–1.030)
Total Protein, Urine: NEGATIVE
UROBILINOGEN UA: 0.2 (ref 0.0–1.0)
Urine Glucose: NEGATIVE
pH: 5.5 (ref 5.0–8.0)

## 2013-08-07 LAB — IBC PANEL
IRON: 56 ug/dL (ref 42–145)
SATURATION RATIOS: 16.6 % — AB (ref 20.0–50.0)
Transferrin: 241.3 mg/dL (ref 212.0–360.0)

## 2013-08-07 LAB — CBC WITH DIFFERENTIAL/PLATELET
Basophils Absolute: 0 10*3/uL (ref 0.0–0.1)
Basophils Relative: 0.5 % (ref 0.0–3.0)
EOS PCT: 5.7 % — AB (ref 0.0–5.0)
Eosinophils Absolute: 0.3 10*3/uL (ref 0.0–0.7)
HCT: 33.4 % — ABNORMAL LOW (ref 36.0–46.0)
Hemoglobin: 11.4 g/dL — ABNORMAL LOW (ref 12.0–15.0)
LYMPHS PCT: 31.7 % (ref 12.0–46.0)
Lymphs Abs: 1.9 10*3/uL (ref 0.7–4.0)
MCHC: 34.1 g/dL (ref 30.0–36.0)
MCV: 87.3 fl (ref 78.0–100.0)
MONOS PCT: 6.5 % (ref 3.0–12.0)
Monocytes Absolute: 0.4 10*3/uL (ref 0.1–1.0)
NEUTROS ABS: 3.3 10*3/uL (ref 1.4–7.7)
NEUTROS PCT: 55.6 % (ref 43.0–77.0)
Platelets: 167 10*3/uL (ref 150.0–400.0)
RBC: 3.83 Mil/uL — AB (ref 3.87–5.11)
RDW: 14.1 % (ref 11.5–15.5)
WBC: 5.9 10*3/uL (ref 4.0–10.5)

## 2013-08-07 LAB — BASIC METABOLIC PANEL
BUN: 17 mg/dL (ref 6–23)
CALCIUM: 9.4 mg/dL (ref 8.4–10.5)
CO2: 27 meq/L (ref 19–32)
Chloride: 106 mEq/L (ref 96–112)
Creatinine, Ser: 0.9 mg/dL (ref 0.4–1.2)
GFR: 70.84 mL/min (ref >60.00)
Glucose, Bld: 204 mg/dL — ABNORMAL HIGH (ref 70–99)
Potassium: 3.3 mEq/L — ABNORMAL LOW (ref 3.5–5.1)
SODIUM: 140 meq/L (ref 135–145)

## 2013-08-07 LAB — HEPATIC FUNCTION PANEL
ALBUMIN: 3.8 g/dL (ref 3.5–5.2)
ALK PHOS: 60 U/L (ref 39–117)
ALT: 30 U/L (ref 0–35)
AST: 28 U/L (ref 0–37)
BILIRUBIN DIRECT: 0.2 mg/dL (ref 0.0–0.3)
Total Bilirubin: 1 mg/dL (ref 0.2–1.2)
Total Protein: 6.6 g/dL (ref 6.0–8.3)

## 2013-08-07 LAB — LIPID PANEL
Cholesterol: 125 mg/dL (ref 0–200)
HDL: 42.6 mg/dL (ref >39.00)
LDL CALC: 48 mg/dL (ref 0–99)
NONHDL: 82.4
Total CHOL/HDL Ratio: 3
Triglycerides: 174 mg/dL — ABNORMAL HIGH (ref 0.0–149.0)
VLDL: 34.8 mg/dL (ref 0.0–40.0)

## 2013-08-07 LAB — MICROALBUMIN / CREATININE URINE RATIO
Creatinine,U: 101.6 mg/dL
Microalb Creat Ratio: 2.5 mg/g (ref 0.0–30.0)
Microalb, Ur: 2.5 mg/dL — ABNORMAL HIGH (ref 0.0–1.9)

## 2013-08-07 LAB — TSH: TSH: 0.03 u[IU]/mL — ABNORMAL LOW (ref 0.35–4.50)

## 2013-08-07 LAB — HEMOGLOBIN A1C: Hgb A1c MFr Bld: 6.4 % (ref 4.6–6.5)

## 2013-08-07 MED ORDER — LEVOTHYROXINE SODIUM 175 MCG PO TABS: 175.0000 ug | ORAL_TABLET | Freq: Every day | ORAL | Status: DC

## 2013-08-07 MED ORDER — OXYCODONE HCL 10 MG PO TABS: 10.0000 mg | ORAL_TABLET | ORAL | Status: DC | PRN

## 2013-08-07 NOTE — Patient Instructions (Signed)
blood tests are being requested for you today.  We'll contact you with results. Please come back for a regular physical appointment in 3 months

## 2013-08-07 NOTE — Progress Notes (Signed)
Subjective:    Patient ID: Savannah [name], female    DOB: June 12, 1955, 58 y.o.   MRN: [id number]  HPI Pt returns for r/u of type 2 DM (dx'ed 2010; she has mild if any neuropathy of the lower extremities; no associated chronic complications; she takes 2 oral meds.  pt states she feels well in general. Past Medical History  Diagnosis Date  . Unspecified hypothyroidism 01/07/2007  . 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 HYPERCHOLESTEROLEMIA 04/18/2009  . HYPOKALEMIA 01/15/2007  . ANEMIA, IRON DEFICIENCY 12/06/2008  . OBSTRUCTIVE SLEEP APNEA 08/09/2009  . HYPERTENSION 09/27/2006  . ALLERGIC RHINITIS CAUSE UNSPECIFIED 12/29/2007  . ASTHMA 09/27/2006  . FATTY LIVER DISEASE 03/19/2008  . URINARY CALCULUS 11/08/2009  . 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 BACK PAIN, LUMBAR 12/29/2009  . Palpitations 05/23/2009  . ASYMPTOMATIC POSTMENOPAUSAL STATUS 03/19/2008  . Chronic kidney disease     Past Surgical History  Procedure Laterality Date  . Knee arthroscopy Left 1993  . Cardiac catheterization  2011  . Compound fracture Right     arm    History   Social History  . Marital Status: Married    Spouse Name: N/A    Number of Children: 2  . Years of Education: N/A   Occupational History  . Homemaker    Social History Main Topics  . Smoking status: Former Smoker -- 0.30 packs/day for 2 years    Types: Cigarettes    Quit date: 02/27/1975  . Smokeless tobacco: Never Used  . Alcohol Use: No  . Drug Use: No  . Sexual Activity: Not on file   Other Topics Concern  . 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 albuterol (PROVENTIL HFA;VENTOLIN HFA) 108 (90 BASE) MCG/ACT inhaler Inhale 2 puffs into the lungs every 4 (four) hours as needed for wheezing or shortness of breath (cough, shortness of breath or wheezing.).  1 Inhaler  12  . benzonatate (TESSALON) 100 MG  capsule Take 1 capsule (100 mg total) by mouth 3 (three) times daily as needed for cough.  30 capsule  1  . cholecalciferol (VITAMIN D) 1000 UNITS tablet Take 1,000 Units by mouth 2 (two) times daily.      . citalopram (CELEXA) 40 MG tablet TAKE ONE TABLET BY MOUTH ONCE DAILY  90 tablet  0  . clotrimazole-betamethasone (LOTRISONE) cream Apply topically 2 (two) times daily. Three times a day as needed for foot cracking  30 g  3  . ferrous fumarate (HEMOCYTE - 106 MG FE) 325 (106 FE) MG TABS Take 1 tablet by mouth daily.      Marland Kitchen gabapentin (NEURONTIN) 300 MG capsule Take 1 capsule (300 mg total) by mouth at bedtime.  30 capsule  5  . hyoscyamine (LEVSIN SL) 0.125 MG SL tablet Take 1-2 tablets by mouth or under tongue every 4 hours as needed.  100 tablet  5  . losartan-hydrochlorothiazide (HYZAAR) 100-12.5 MG per tablet TAKE 1 TABLET DAILY  90 tablet  2  . meclizine (ANTIVERT) 12.5 MG tablet Take 1 tablet (12.5 mg total) by mouth 3 (three) times daily as needed for dizziness. 1-2 by mouth three times a day as needed  50 tablet  3  . metFORMIN (GLUCOPHAGE-XR) 500 MG 24 hr tablet TAKE 2 TABLETS TWICE A DAY  360 tablet  2  . metoprolol succinate (  TOPROL-XL) 25 MG 24 hr tablet TAKE 1 TABLET DAILY  90 tablet  2  . mometasone-formoterol (DULERA) 100-5 MCG/ACT AERO Inhale 2 puffs into the lungs 2 (two) times daily.  1 Inhaler  11  . omeprazole (PRILOSEC) 40 MG capsule Take 1 capsule (40 mg total) by mouth 2 (two) times daily.  180 capsule  3  . ondansetron (ZOFRAN) 4 MG tablet Take 1 tablet (4 mg total) by mouth every 6 (six) hours as needed for nausea.  30 tablet  1  . pioglitazone (ACTOS) 15 MG tablet Take 1 tablet (15 mg total) by mouth daily.  90 tablet  3  . traZODone (DESYREL) 100 MG tablet Take 1 tablet (100 mg total) by mouth at bedtime.  90 tablet  6   No current facility-administered medications on file prior to visit.    Allergies  Allergen Reactions  . Erythromycin     REACTION:  nausea/vomiting    Family History  Problem Relation Age of Onset  . Colon cancer Mother 84  . Diabetes Mother   . Kidney disease Mother   . Heart attack Father   . Heart disease Father   . Allergies Sister   . Asthma Sister   . Heart disease Paternal Grandfather   . Clotting disorder Grandchild     BP 122/88  Pulse 76  Temp(Src) 98.5 F (36.9 C) (Oral)  Ht 5' 7.5" (1.715 m)  Wt 219 lb (99.338 kg)  BMI 33.77 kg/m2  SpO2 97%  Review of Systems Denies weight change and sob    Objective:   Physical Exam Pulses: dorsalis pedis intact bilat.   Feet: no deformity. normal color and temp.  no edema.  Hammer toes bilaterally.   Skin:  no ulcer on the feet.   Neuro: sensation is intact to touch on the feet.   Lab Results  Component Value Date   WBC 5.9 08/07/2013   HGB 11.4* 08/07/2013   HCT 33.4* 08/07/2013   PLT 167.0 08/07/2013   GLUCOSE 204* 08/07/2013   CHOL 125 08/07/2013   TRIG 174.0* 08/07/2013   HDL 42.60 08/07/2013   LDLDIRECT 135.7 08/01/2012   LDLCALC 48 08/07/2013   ALT 30 08/07/2013   AST 28 08/07/2013   NA 140 08/07/2013   K 3.3* 08/07/2013   CL 106 08/07/2013   CREATININE 0.9 08/07/2013   BUN 17 08/07/2013   CO2 27 08/07/2013   TSH 0.03* 08/07/2013   INR 1.1 ratio* 05/23/2009   HGBA1C 6.4 08/07/2013   MICROALBUR 2.5* 08/07/2013       Assessment & Plan:  Hypokalemia: fair control. DM: well-controlled Hematuria; ? Related to urolithiasis.  She is advised to f/u with her urologist Hypothyroidism: overreplaced    Patient is advised the following: Patient Instructions  blood tests are being requested for you today.  We'll contact you with results. Please come back for a regular physical appointment in 3 months

## 2013-08-10 LAB — PTH, INTACT AND CALCIUM
Calcium: 9.7 mg/dL (ref 8.4–10.5)
PTH: 27.8 pg/mL (ref 14.0–72.0)

## 2013-08-20 ENCOUNTER — Encounter: Payer: Commercial Indemnity | Admitting: Gastroenterology

## 2013-08-22 ENCOUNTER — Other Ambulatory Visit: Payer: Self-pay | Admitting: Endocrinology

## 2013-08-24 ENCOUNTER — Other Ambulatory Visit: Payer: Self-pay | Admitting: *Deleted

## 2013-08-24 NOTE — Telephone Encounter (Signed)
Please advise if ok to refill. Med is not on current list.  Thanks!

## 2013-09-01 ENCOUNTER — Ambulatory Visit (INDEPENDENT_AMBULATORY_CARE_PROVIDER_SITE_OTHER): Payer: Managed Care, Other (non HMO)

## 2013-09-01 VITALS — BP 115/65 | HR 92 | Resp 16

## 2013-09-01 DIAGNOSIS — M2030 Hallux varus (acquired), unspecified foot: Secondary | ICD-10-CM

## 2013-09-01 DIAGNOSIS — M2040 Other hammer toe(s) (acquired), unspecified foot: Secondary | ICD-10-CM

## 2013-09-01 DIAGNOSIS — R269 Unspecified abnormalities of gait and mobility: Secondary | ICD-10-CM

## 2013-09-01 DIAGNOSIS — M2041 Other hammer toe(s) (acquired), right foot: Secondary | ICD-10-CM

## 2013-09-01 DIAGNOSIS — M19079 Primary osteoarthritis, unspecified ankle and foot: Secondary | ICD-10-CM

## 2013-09-01 DIAGNOSIS — M2031 Hallux varus (acquired), right foot: Secondary | ICD-10-CM

## 2013-09-01 NOTE — Patient Instructions (Signed)
Pre-Operative Instructions  Congratulations, you have decided to take an important step to improving your quality of life.  You can be assured that the doctors of [facility] will be with you every step of the way.  1. Plan to be at the surgery center/hospital at least 1 (one) hour prior to your scheduled time unless otherwise directed by the surgical center/hospital staff.  You must have a responsible adult accompany you, remain during the surgery and drive you home.  Make sure you have directions to the surgical center/hospital and know how to get there on time. 2. For hospital based surgery you will need to obtain a history and physical form from your family physician within 1 month prior to the date of surgery- we will give you a form for you primary physician.  3. We make every effort to accommodate the date you request for surgery.  There are however, times where surgery dates or times have to be moved.  We will contact you as soon as possible if a change in schedule is required.   4. No Aspirin/Ibuprofen for one week before surgery.  If you are on aspirin, any non-steroidal anti-inflammatory medications (Mobic, Aleve, Ibuprofen) you should stop taking it 7 days prior to your surgery.  You make take Tylenol  For pain prior to surgery.  5. Medications- If you are taking daily heart and blood pressure medications, seizure, reflux, allergy, asthma, anxiety, pain or diabetes medications, make sure the surgery center/hospital is aware before the day of surgery so they may notify you which medications to take or avoid the day of surgery. 6. No food or drink after midnight the night before surgery unless directed otherwise by surgical center/hospital staff. 7. No alcoholic beverages 24 hours prior to surgery.  No smoking 24 hours prior to or 24 hours after surgery. 8. Wear loose pants or shorts- loose enough to fit over bandages, boots, and casts. 9. No slip on shoes, sneakers are best. 10. Bring  your boot with you to the surgery center/hospital.  Also bring crutches or a walker if your physician has prescribed it for you.  If you do not have this equipment, it will be provided for you after surgery. 11. If you have not been contracted by the surgery center/hospital by the day before your surgery, call to confirm the date and time of your surgery. 12. Leave-time from work may vary depending on the type of surgery you have.  Appropriate arrangements should be made prior to surgery with your employer. 13. Prescriptions will be provided immediately following surgery by your doctor.  Have these filled as soon as possible after surgery and take the medication as directed. 14. Remove nail polish on the operative foot. 15. Wash the night before surgery.  The night before surgery wash the foot and leg well with the antibacterial soap provided and water paying special attention to beneath the toenails and in between the toes.  Rinse thoroughly with water and dry well with a towel.  Perform this wash unless told not to do so by your physician.  Enclosed: 1 Ice pack (please put in freezer the night before surgery)   1 Hibiclens skin cleaner   Pre-op Instructions  If you have any questions regarding the instructions, do not hesitate to call our office.  [place]: [address] [place], [place] [address] [phone]  [place]: [address] [place], [place] [address] [phone]  [place]: [address]  [place],  [address] [phone]  Dr. Cierria Height   Tuchman DPM, Dr. Norman Regal DPM Dr. Cleo Santucci DPM, Dr. M. Todd Hyatt DPM, Dr. Kathryn Egerton DPM 

## 2013-09-01 NOTE — Progress Notes (Signed)
   Subjective:    Patient ID: Savannah [name], female    DOB: November 26, 1955, 58 y.o.   MRN: [id number]  HPI Comments: its ok-rt. i want to see about the surgery     Review of Systems no new systemic findings or changes     Objective:   Physical Exam 58 year old white female presents at this time for followup and discussion of surgical options. Patient had a ruptured Achilles with loss of Achilles function right side and subsequently developed hallux malleus and hammertoe deformities 1 through 5 right foot. These become painful tender symptomatic patient wishes to have these surgically corrected at this time.  Or study objective findings as follows vascular status appears to be intact pedal pulses palpable DP +2/4 PT plus one at +2/4 bilateral Refill time 3 seconds all digits mild +1 edema noted. Neurologically epicritic for presumptive sensations intact although diminished to the distal toes and plantar foot tissue some paresthesias are noted patient has loss of Achilles tendon function due to a ruptured Achilles tendon right side. Does have gait abnormality weakness as a result of the clean-air rigid digital contractures the hallux at the IP joint and IP joints second third fourth and fifth toes of the right foot show contractures in a plantigrade position.  no open wounds ulcerations no signs of skin infection. X-rays are reviewed at this time revealing digital contractures with semirigid contractures.       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 Plan at this time patient is consented to surgery for hammertoe repair 234 and 5 and hallux IP joint fusion from hallux malleus repair right great toe consent form is reviewed and signed all questions asked by the patient are answered there no complications begin vascular  neurologic status adequate for surgery patient does sleep with a CPAP otherwise has no significant respiratory difficulties should be K. for IV sedation and regional anesthetic block for surgery. Surgery scheduled at her convenience at this time.  Harriet Masson DPM

## 2013-09-05 ENCOUNTER — Other Ambulatory Visit: Payer: Self-pay | Admitting: Endocrinology

## 2013-09-07 DIAGNOSIS — M2040 Other hammer toe(s) (acquired), unspecified foot: Secondary | ICD-10-CM

## 2013-09-07 DIAGNOSIS — M19079 Primary osteoarthritis, unspecified ankle and foot: Secondary | ICD-10-CM

## 2013-09-07 DIAGNOSIS — M2030 Hallux varus (acquired), unspecified foot: Secondary | ICD-10-CM

## 2013-09-09 NOTE — Progress Notes (Signed)
Dr Blenda Mounts performed a hammertoe repair right foot mets 2,3,4,5 and a hallux IPJ fusion right foot 1st met on 09/07/13. Prescribed percocet 5/382m #45 1-2 tabs Q6hrs prn pain and Keflex 5072m#12 1 tab Q6hrs

## 2013-09-10 ENCOUNTER — Telehealth: Payer: Self-pay

## 2013-09-10 NOTE — Telephone Encounter (Signed)
Left message with other person, pt was sleeping, to call with any questions or concerns

## 2013-09-15 ENCOUNTER — Ambulatory Visit (INDEPENDENT_AMBULATORY_CARE_PROVIDER_SITE_OTHER): Payer: Managed Care, Other (non HMO) | Admitting: Podiatry

## 2013-09-15 ENCOUNTER — Ambulatory Visit (INDEPENDENT_AMBULATORY_CARE_PROVIDER_SITE_OTHER): Payer: Managed Care, Other (non HMO)

## 2013-09-15 VITALS — BP 119/67 | HR 78 | Temp 99.5°F | Resp 17 | Ht 68.0 in | Wt 219.0 lb

## 2013-09-15 DIAGNOSIS — S9001XA Contusion of right ankle, initial encounter: Secondary | ICD-10-CM

## 2013-09-15 DIAGNOSIS — S9000XA Contusion of unspecified ankle, initial encounter: Secondary | ICD-10-CM

## 2013-09-15 DIAGNOSIS — M2040 Other hammer toe(s) (acquired), unspecified foot: Secondary | ICD-10-CM

## 2013-09-15 MED ORDER — OXYCODONE-ACETAMINOPHEN 5-325 MG PO TABS: ORAL_TABLET | ORAL | Status: DC

## 2013-09-15 NOTE — Progress Notes (Signed)
   Subjective:    Patient ID: Savannah [name], female    DOB: 07/19/1955, 58 y.o.   MRN: [id number]  HPI Comments: Pt presents for POV1 of right hallux IP fusion, 2,3,4 hammer toe repair with pins, and 5th hammer toe repair, performed by Dr. Blenda Mounts.  Pt states she fell one day after surgery and complains of right ankle pain.  Pt's surgery sites are without drainage, and the suture sites are healing well with little swelliing.  Pt's right lateral ankle is swollen and discolored.     Review of Systems  All other systems reviewed and are negative.      Objective:   Physical Exam radiographic evaluation demonstrates well-healing surgical foot. However her right ankle does demonstrate what appears to be a high fibular fracture nondisplaced non-comminuted        Assessment & Plan:  Well-healing surgical foot right. Fractured ankle/fibula right.

## 2013-09-17 ENCOUNTER — Ambulatory Visit (INDEPENDENT_AMBULATORY_CARE_PROVIDER_SITE_OTHER): Payer: Managed Care, Other (non HMO) | Admitting: Podiatry

## 2013-09-17 ENCOUNTER — Telehealth: Payer: Self-pay | Admitting: *Deleted

## 2013-09-17 VITALS — BP 109/64 | HR 88 | Resp 18

## 2013-09-17 DIAGNOSIS — Z472 Encounter for removal of internal fixation device: Secondary | ICD-10-CM

## 2013-09-17 NOTE — Telephone Encounter (Signed)
I returned her call.  She stated that the pin looks like it is coming out of her 2nd toe.  It doesn't look like it's sitting the same now like the other toes.  I told her she needs to be seen either today or tomorrow.  She stated okay.  I transferred her to a scheduler.

## 2013-09-17 NOTE — Telephone Encounter (Signed)
I had surgery on Monday the 13th.  Having trouble with 2nd toe, the pin is sticking out more.  Please call.

## 2013-09-19 NOTE — Progress Notes (Signed)
Subjective:     Patient ID: Savannah [name], female   DOB: 06/13/1955, 58 y.o.   MRN: [id number]  HPI patient presents stating this pain has come out on my second toe and it's impossible to get back in and I might of clotted I'm not sure but it is painful   Review of Systems     Objective:   Physical Exam Dressing is on so I did not remove as it is sterile today and the pain in the second toe has moved out approximately 2/3 of its way and is not stable at this time    Assessment:     Abnormal removal of pins second toe right foot secondary to probable trauma    Plan:     Due to the pain been out so far I did go ahead and remove the rest of it and applied sterile dressing. We will use dressings to hold the toe stable and it should not give her any long-term issues. Stated it felt much better when removed

## 2013-09-23 ENCOUNTER — Ambulatory Visit (INDEPENDENT_AMBULATORY_CARE_PROVIDER_SITE_OTHER): Payer: Managed Care, Other (non HMO)

## 2013-09-23 VITALS — BP 135/78 | HR 85 | Resp 17

## 2013-09-23 DIAGNOSIS — S82401D Unspecified fracture of shaft of right fibula, subsequent encounter for closed fracture with routine healing: Secondary | ICD-10-CM

## 2013-09-23 DIAGNOSIS — E1142 Type 2 diabetes mellitus with diabetic polyneuropathy: Secondary | ICD-10-CM

## 2013-09-23 DIAGNOSIS — Z9889 Other specified postprocedural states: Secondary | ICD-10-CM

## 2013-09-23 DIAGNOSIS — E1149 Type 2 diabetes mellitus with other diabetic neurological complication: Secondary | ICD-10-CM

## 2013-09-23 DIAGNOSIS — M2030 Hallux varus (acquired), unspecified foot: Secondary | ICD-10-CM

## 2013-09-23 DIAGNOSIS — M2031 Hallux varus (acquired), right foot: Secondary | ICD-10-CM

## 2013-09-23 DIAGNOSIS — S8290XD Unspecified fracture of unspecified lower leg, subsequent encounter for closed fracture with routine healing: Secondary | ICD-10-CM

## 2013-09-23 DIAGNOSIS — M2041 Other hammer toe(s) (acquired), right foot: Secondary | ICD-10-CM

## 2013-09-23 DIAGNOSIS — M2040 Other hammer toe(s) (acquired), unspecified foot: Secondary | ICD-10-CM

## 2013-09-23 DIAGNOSIS — E1140 Type 2 diabetes mellitus with diabetic neuropathy, unspecified: Secondary | ICD-10-CM

## 2013-09-23 NOTE — Progress Notes (Signed)
   Subjective:    Patient ID: Savannah [name], female    DOB: 1955/04/26, 58 y.o.   MRN: [id number]  HPI Pt presents as post op hammertoe repair DOS 09/07/13. Pt injured/fractured ankle on 09/08/13, she remains in boot and crutches, afebrile, xrays done   Review of Systems no new findings or systemic changes noted     Objective:   Physical Exam Patient is approximately 2 weeks status post hammertoe repair and hallux malleus repair right foot. One week ago patient presented for followup in one of the pins have backed out and was removed and redressing was performed. However should note that the day after her surgery on 09/08/2013 she fell and fractured her ankle was seen by Dr. Milinda Pointer at that time for her first postop visit and then subsequently by Dr. Felisa Bonier for a one-week postop and reevaluation. There still edema postoperatively of the forefoot and digits dressings intact and dry x-rays reveal contracture of the second digit proximal IP joint following removal of the fixation early. There continues to be plantar grade contracture of the toe which is flexible at this time and put back into a straight position. Patient is advised we'll have to buddy splint or maintain the position externally allowing for arthrodesis or fusion or fibrosis into a straighter rectus position. Having pain in the ankle the forefoot has minimal pain or discomfort good alignment of the remaining digits noted other fixations nondisplaced       Assessment & Plan:  Assessment postoperative following multiple hammertoe and hallux malleus repair second pin had you cannot really do bit of trauma or contusion it loosened up and which destabilized second toe it will need to be maintained are stabilized externally with splinting in the future. Buddy wrap is applied at this time and buddy dressing is applied to maintain the second digit a rectus position adjacent to the third and fourth. Presto compressive dressing reapplied  reappointed one week plan for possible suture removal and followup ankle x-ray at that time as well. Patient had nondisplaced ankle fracture lateral ilial or fibular area right foot the day after surgery. Contact us immediately if any changes or exacerbations her knee difficulties anytime  Harriet Masson DPM

## 2013-09-23 NOTE — Patient Instructions (Signed)

## 2013-09-29 ENCOUNTER — Encounter: Payer: Commercial Indemnity | Admitting: Gastroenterology

## 2013-09-30 ENCOUNTER — Ambulatory Visit (INDEPENDENT_AMBULATORY_CARE_PROVIDER_SITE_OTHER): Payer: Managed Care, Other (non HMO)

## 2013-09-30 VITALS — BP 117/52 | HR 74 | Resp 15 | Ht 67.5 in | Wt 219.0 lb

## 2013-09-30 DIAGNOSIS — M79671 Pain in right foot: Secondary | ICD-10-CM

## 2013-09-30 DIAGNOSIS — M79609 Pain in unspecified limb: Secondary | ICD-10-CM

## 2013-09-30 DIAGNOSIS — Z9889 Other specified postprocedural states: Secondary | ICD-10-CM

## 2013-09-30 DIAGNOSIS — S8290XD Unspecified fracture of unspecified lower leg, subsequent encounter for closed fracture with routine healing: Secondary | ICD-10-CM

## 2013-09-30 DIAGNOSIS — M2030 Hallux varus (acquired), unspecified foot: Secondary | ICD-10-CM

## 2013-09-30 DIAGNOSIS — M2040 Other hammer toe(s) (acquired), unspecified foot: Secondary | ICD-10-CM

## 2013-09-30 DIAGNOSIS — S82401D Unspecified fracture of shaft of right fibula, subsequent encounter for closed fracture with routine healing: Secondary | ICD-10-CM

## 2013-09-30 DIAGNOSIS — M2031 Hallux varus (acquired), right foot: Secondary | ICD-10-CM

## 2013-09-30 NOTE — Progress Notes (Signed)
   Subjective:    Patient ID: Savannah [name], female    DOB: April 16, 1955, 58 y.o.   MRN: [id number]  HPI Comments: Pt states she feels her pin may be moving out, and the ankle is paining.     Review of Systems no new findings or systemic changes noted at this time     Objective:   Physical Exam Lower extremity objective findings unchanged pedal pulses palpable incisions clean dry well coapted dressings are removed at this time sutures are removed Neosporin applied to the incisions in the toes are Coflex wrap with a buddy wrap system of all 5 toes including a Plastizote toe splint for the second toe which lost its pin fixation. X-rays confirm contractures second digit although we'll try to maintain the splint and keep the toe a rectus position. Also this time ankle x-rays reveal a nondisplaced fracture the fibula distal tip of fibula show some periostitis however no displacement mild edema consistent with post injury of swelling and edema of the right ankle.       Assessment & Plan:  Assessment good postop progress other than losing fixation second toe maintain splinting of toes also Inc. was dispensed to maintain compression of the foot recheck again in 3 weeks plan for followup x-ray and likely pin removal the remaining pins at that time. Contact us if any changes or difficulties in the interim. Maintain moderate activities elevated ice whenever possible.  Harriet Masson DPM

## 2013-09-30 NOTE — Patient Instructions (Addendum)
ICE INSTRUCTIONS  Apply ice or cold pack to the affected area at least 3 times a day for 10-15 minutes each time.  You should also use ice after prolonged activity or vigorous exercise.  Do not apply ice longer than 20 minutes at one time.  Always keep a cloth between your skin and the ice pack to prevent burns.  Being consistent and following these instructions will help control your symptoms.  We suggest you purchase a gel ice pack because they are reusable and do bit leak.  Some of them are designed to wrap around the area.  Use the method that works best for you.  Here are some other suggestions for icing.   Use a frozen bag of peas or corn-inexpensive and molds well to your body, usually stays frozen for 10 to 20 minutes.  Wet a towel with cold water and squeeze out the excess until it's damp.  Place in a bag in the freezer for 20 minutes. Then remove and use.  Maintain Coflex wrap in the buddy splinting of toes as instructed to maintain the position of the toes most significant wrapping for the second third and fourth toes be wrapped together.  Maintain the compression anklet when moving or walking around during the day he can take off at night to be washed reapplied again in the morning

## 2013-10-06 ENCOUNTER — Telehealth: Payer: Self-pay | Admitting: *Deleted

## 2013-10-06 NOTE — Telephone Encounter (Signed)
I called and informed her that Dr. Blenda Mounts said it's probably Echymosis, Bruising, from the bandages from the surgery.  She said okay but it only happens when I stand up then when I sit down and elevate it goes away.  I told her I would let him know. If he says anything different, I will let you know.  She stated okay thanks.

## 2013-10-06 NOTE — Telephone Encounter (Signed)
I returned her call.  I asked her what part of her foot was she noticing the color change.  She stated across the toes and in her instep.  I told her I would check with Dr. Blenda Mounts and see what he suggests and give you a call back.  She stated okay.

## 2013-10-06 NOTE — Telephone Encounter (Signed)
Had surgery on the 13th of July.  I have some questions about my foot.  It turns black then it'll turn back to regular color.

## 2013-10-14 NOTE — Telephone Encounter (Signed)
I called the patient to see how she was doing.  She stated I'm doing pretty good.  My ankle is sore, that's all.  I told her I know you had the concern about your foot changing colors when you stand but Dr. Blenda Mounts said it is because of the blood flowing down to your feet when you stand causing a pool.  It's nothing to be concerned about.  She stated okay thank you for calling.

## 2013-10-21 ENCOUNTER — Ambulatory Visit (INDEPENDENT_AMBULATORY_CARE_PROVIDER_SITE_OTHER): Payer: Managed Care, Other (non HMO)

## 2013-10-21 DIAGNOSIS — M2041 Other hammer toe(s) (acquired), right foot: Secondary | ICD-10-CM

## 2013-10-21 DIAGNOSIS — Z9889 Other specified postprocedural states: Secondary | ICD-10-CM

## 2013-10-21 DIAGNOSIS — S82401D Unspecified fracture of shaft of right fibula, subsequent encounter for closed fracture with routine healing: Secondary | ICD-10-CM

## 2013-10-21 DIAGNOSIS — R269 Unspecified abnormalities of gait and mobility: Secondary | ICD-10-CM

## 2013-10-21 DIAGNOSIS — M2040 Other hammer toe(s) (acquired), unspecified foot: Secondary | ICD-10-CM

## 2013-10-21 DIAGNOSIS — M2031 Hallux varus (acquired), right foot: Secondary | ICD-10-CM

## 2013-10-21 DIAGNOSIS — M2030 Hallux varus (acquired), unspecified foot: Secondary | ICD-10-CM

## 2013-10-21 DIAGNOSIS — S8290XD Unspecified fracture of unspecified lower leg, subsequent encounter for closed fracture with routine healing: Secondary | ICD-10-CM

## 2013-10-21 MED ORDER — TRAMADOL HCL 50 MG PO TABS: 50.0000 mg | ORAL_TABLET | Freq: Three times a day (TID) | ORAL | Status: DC | PRN

## 2013-10-21 NOTE — Progress Notes (Signed)
   Subjective:    Patient ID: Savannah [name], female    DOB: [birth year], 58 y.o.   MRN: [id number]  HPI  post op hammertoe repair DOS 09/07/13.   Review of Systems no new findings or systemic changes noted     Objective:   Physical Exam Neurovascular status is intact pedal pulses palpable patient continues to have hypersensitivity and contractures second toe right foot having had the pain cannot early the second toe is contracted plantarly and is tender on trying to reduce that toe into a dorsiflexed are rectus position. Pins and third and fourth toes are ready to come out this time x-rays reveal good position and alignment and pin fixation possible in 6 weeks postop patient also did have ankle views taken this time reveal no additional displacement malleoli are area continues have some lateral ankle pain consistent with a nondisplaced fibular fracture.       Assessment & Plan:  Assessment #1 nondisplaced fibular fracture continue with immobilization air fracture boot for leaks Miller 2 weeks and then followup in 4 weeks for reevaluation at one she comes out of the boot and return to couple firm soled athletic or walking shoe no flimsy shoes no flip-flops or barefoot maintain the use of a walker for assistance and weightbearing patient may resume her initiate weightbearing on her right foot at this time with air fracture boot in place. Also will maintain splinting of toes having removed the K wire fixations at this time. Assessment #2 status post hammertoe surgeries and hallux malleus repair of the pins are removed the digits are stabilized with Coflex wrapping and the second toe splint is fabricated and applied to the second toe maintain a rectus position at the IP joint. Inc. this with a buddy wrap utilizing Coflex to the adjacent 6/3 and fourth toes. Remaining internal fixation screw first toe appears to be well aligned incisions are clean dry well coapted no open wounds ulcerations noted  recheck in one month for further followup and x-ray reevaluation  Harriet Masson DPM

## 2013-10-21 NOTE — Patient Instructions (Signed)
ICE INSTRUCTIONS  Apply ice or cold pack to the affected area at least 3 times a day for 10-15 minutes each time.  You should also use ice after prolonged activity or vigorous exercise.  Do not apply ice longer than 20 minutes at one time.  Always keep a cloth between your skin and the ice pack to prevent burns.  Being consistent and following these instructions will help control your symptoms.  We suggest you purchase a gel ice pack because they are reusable and do bit leak.  Some of them are designed to wrap around the area.  Use the method that works best for you.  Here are some other suggestions for icing.   Use a frozen bag of peas or corn-inexpensive and molds well to your body, usually stays frozen for 10 to 20 minutes.  Wet a towel with cold water and squeeze out the excess until it's damp.  Place in a bag in the freezer for 20 minutes. Then remove and use.   Maintain the digital splint on the second toe to help reduce the contracture keep the splint in place with Coflex wrap. Also buddy wrap to the adjacent second and third toes

## 2013-10-28 ENCOUNTER — Encounter: Payer: Self-pay | Admitting: Internal Medicine

## 2013-10-28 ENCOUNTER — Ambulatory Visit (INDEPENDENT_AMBULATORY_CARE_PROVIDER_SITE_OTHER): Payer: Commercial Indemnity | Admitting: Internal Medicine

## 2013-10-28 VITALS — BP 120/72 | HR 89 | Temp 98.7°F

## 2013-10-28 DIAGNOSIS — I10 Essential (primary) hypertension: Secondary | ICD-10-CM

## 2013-10-28 DIAGNOSIS — R0989 Other specified symptoms and signs involving the circulatory and respiratory systems: Secondary | ICD-10-CM

## 2013-10-28 DIAGNOSIS — J018 Other acute sinusitis: Secondary | ICD-10-CM

## 2013-10-28 DIAGNOSIS — R0609 Other forms of dyspnea: Secondary | ICD-10-CM

## 2013-10-28 DIAGNOSIS — R0600 Dyspnea, unspecified: Secondary | ICD-10-CM | POA: Insufficient documentation

## 2013-10-28 MED ORDER — LEVOFLOXACIN 250 MG PO TABS: 250.0000 mg | ORAL_TABLET | Freq: Every day | ORAL | Status: DC

## 2013-10-28 MED ORDER — HYDROCODONE-HOMATROPINE 5-1.5 MG/5ML PO SYRP
5.0000 mL | ORAL_SOLUTION | Freq: Four times a day (QID) | ORAL | Status: DC | PRN
Start: 2013-10-28 — End: 2013-12-04

## 2013-10-28 NOTE — Assessment & Plan Note (Addendum)
To cont inhaler use, stable, decliens predpac,  to f/u any worsening symptoms or concerns

## 2013-10-28 NOTE — Progress Notes (Signed)
Pre visit review using our clinic review tool, if applicable. No additional management support is needed unless otherwise documented below in the visit note. 

## 2013-10-28 NOTE — Progress Notes (Signed)
Subjective:    Patient ID: Savannah [name], female    DOB: [birth year], 58 y.o.   MRN: [id number]  HPI  Here with 2-3 days acute onset fever, facial pain, pressure, headache, general weakness and malaise, and greenish d/c, with mild ST and cough, but pt denies chest pain, wheezing, increased sob or doe, orthopnea, PND, increased LE swelling, palpitations, dizziness or syncope, with mild wheeze/sob onset last PM. Pt denies new neurological symptoms such as new headache, or facial or extremity weakness or numbness   Past Medical History  Diagnosis Date  . Unspecified hypothyroidism 01/07/2007  . 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 HYPERCHOLESTEROLEMIA 04/18/2009  . HYPOKALEMIA 01/15/2007  . ANEMIA, IRON DEFICIENCY 12/06/2008  . OBSTRUCTIVE SLEEP APNEA 08/09/2009  . HYPERTENSION 09/27/2006  . ALLERGIC RHINITIS CAUSE UNSPECIFIED 12/29/2007  . ASTHMA 09/27/2006  . FATTY LIVER DISEASE 03/19/2008  . URINARY CALCULUS 11/08/2009  . 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 BACK PAIN, LUMBAR 12/29/2009  . Palpitations 05/23/2009  . ASYMPTOMATIC POSTMENOPAUSAL STATUS 03/19/2008  . Chronic kidney disease    Past Surgical History  Procedure Laterality Date  . Knee arthroscopy Left 1993  . Cardiac catheterization  2011  . Compound fracture Right     arm    reports that she quit smoking about 38 years ago. Her smoking use included Cigarettes. She has a .6 pack-year smoking history. She has never used smokeless tobacco. She reports that she does not drink alcohol or use illicit drugs. family history includes Allergies in her sister; Asthma in her sister; Clotting disorder in her grandchild; Colon cancer (age of onset: 53) in her mother; Diabetes in her mother; Heart attack in her father; Heart disease in her father and paternal grandfather; Kidney disease in her mother. Allergies  Allergen Reactions  . Erythromycin     REACTION: nausea/vomiting   Current Outpatient Prescriptions on File Prior to Visit  Medication Sig  Dispense Refill  . albuterol (PROVENTIL HFA;VENTOLIN HFA) 108 (90 BASE) MCG/ACT inhaler Inhale 2 puffs into the lungs every 4 (four) hours as needed for wheezing or shortness of breath (cough, shortness of breath or wheezing.).  1 Inhaler  12  . atorvastatin (LIPITOR) 40 MG tablet TAKE 1 TABLET DAILY  90 tablet  2  . benzonatate (TESSALON) 100 MG capsule Take 1 capsule (100 mg total) by mouth 3 (three) times daily as needed for cough.  30 capsule  1  . cholecalciferol (VITAMIN D) 1000 UNITS tablet Take 1,000 Units by mouth 2 (two) times daily.      . citalopram (CELEXA) 40 MG tablet TAKE ONE TABLET BY MOUTH ONCE DAILY  90 tablet  0  . clotrimazole-betamethasone (LOTRISONE) cream Apply topically 2 (two) times daily. Three times a day as needed for foot cracking  30 g  3  . ferrous fumarate (HEMOCYTE - 106 MG FE) 325 (106 FE) MG TABS Take 1 tablet by mouth daily.      Marland Kitchen gabapentin (NEURONTIN) 300 MG capsule Take 1 capsule (300 mg total) by mouth at bedtime.  30 capsule  5  . hyoscyamine (LEVSIN SL) 0.125 MG SL tablet Take 1-2 tablets by mouth or under tongue every 4 hours as needed.  100 tablet  5  . levothyroxine (SYNTHROID, LEVOTHROID) 175 MCG tablet Take 1 tablet (175 mcg total) by mouth daily before breakfast.  30 tablet  11  . losartan-hydrochlorothiazide (HYZAAR) 100-12.5 MG per tablet TAKE 1 TABLET DAILY  90 tablet  2  . meclizine (ANTIVERT) 12.5 MG tablet Take  1 tablet (12.5 mg total) by mouth 3 (three) times daily as needed for dizziness. 1-2 by mouth three times a day as needed  50 tablet  3  . metFORMIN (GLUCOPHAGE-XR) 500 MG 24 hr tablet TAKE 2 TABLETS TWICE A DAY  360 tablet  2  . metoprolol succinate (TOPROL-XL) 25 MG 24 hr tablet TAKE 1 TABLET DAILY  90 tablet  2  . mometasone-formoterol (DULERA) 100-5 MCG/ACT AERO Inhale 2 puffs into the lungs 2 (two) times daily.  1 Inhaler  11  . omeprazole (PRILOSEC) 40 MG capsule Take 1 capsule (40 mg total) by mouth 2 (two) times daily.  180  capsule  3  . ondansetron (ZOFRAN) 4 MG tablet Take 1 tablet (4 mg total) by mouth every 6 (six) hours as needed for nausea.  30 tablet  1  . Oxycodone HCl 10 MG TABS Take 1 tablet (10 mg total) by mouth every 4 (four) hours as needed. For pain  120 each  0  . oxyCODONE-acetaminophen (ROXICET) 5-325 MG per tablet 1 or 2 tablet every 4 to 6 hours for pain  30 tablet  0  . pioglitazone (ACTOS) 15 MG tablet Take 1 tablet (15 mg total) by mouth daily.  90 tablet  3  . traMADol (ULTRAM) 50 MG tablet Take 1 tablet (50 mg total) by mouth every 8 (eight) hours as needed.  90 tablet  0  . traZODone (DESYREL) 100 MG tablet Take 1 tablet (100 mg total) by mouth at bedtime.  90 tablet  6   No current facility-administered medications on file prior to visit.   Review of Systems  Constitutional: Negative for unusual diaphoresis or other sweats  HENT: Negative for ringing in ear Eyes: Negative for double vision or worsening visual disturbance.  Respiratory: Negative for choking and stridor.   Gastrointestinal: Negative for vomiting or other signifcant bowel change Genitourinary: Negative for hematuria or decreased urine volume.  Musculoskeletal: Negative for other MSK pain or swelling Skin: Negative for color change and worsening wound.  Neurological: Negative for tremors and numbness other than noted  Psychiatric/Behavioral: Negative for decreased concentration or agitation other than above       Objective:   Physical Exam BP 120/72  Pulse 89  Temp(Src) 98.7 F (37.1 C) (Oral)  SpO2 95% VS noted, mild ill Constitutional: Pt appears well-developed, well-nourished.  HENT: Head: NCAT.  Right Ear: External ear normal.  Left Ear: External ear normal.  Eyes: . Pupils are equal, round, and reactive to light. Conjunctivae and EOM are normal Bilat tm's with mild erythema.  Max sinus areas mild tender.  Pharynx with mild erythema, no exudate Neck: Normal range of motion. Neck supple.  Cardiovascular:  Normal rate and regular rhythm.   Pulmonary/Chest: Effort normal and breath sounds decreased, few bilat wheeze, no rales.  Neurological: Pt is alert. Not confused , motor grossly intact Skin: Skin is warm. No rash Psychiatric: Pt behavior is normal. No agitation.     Assessment & Plan:

## 2013-10-28 NOTE — Patient Instructions (Signed)
Please take all new medication as prescribed - the antibiotic, and cough medicine  Please continue all other medications as before, including the inhaler  Please have the pharmacy call with any other refills you may need.  Please keep your appointments with your specialists as you may have planned

## 2013-11-01 NOTE — Assessment & Plan Note (Signed)
stable overall by history and exam, recent data reviewed with pt, and pt to continue medical treatment as before,  to f/u any worsening symptoms or concerns BP Readings from Last 3 Encounters:  10/28/13 120/72  09/30/13 117/52  09/23/13 135/78

## 2013-11-01 NOTE — Assessment & Plan Note (Signed)
Mild to mod, for antibx course,  to f/u any worsening symptoms or concerns 

## 2013-11-04 ENCOUNTER — Ambulatory Visit (INDEPENDENT_AMBULATORY_CARE_PROVIDER_SITE_OTHER): Payer: Managed Care, Other (non HMO)

## 2013-11-04 VITALS — BP 109/63 | HR 105 | Resp 15 | Ht 67.5 in | Wt 219.0 lb

## 2013-11-04 DIAGNOSIS — Z9889 Other specified postprocedural states: Secondary | ICD-10-CM

## 2013-11-04 DIAGNOSIS — S8290XD Unspecified fracture of unspecified lower leg, subsequent encounter for closed fracture with routine healing: Secondary | ICD-10-CM

## 2013-11-04 DIAGNOSIS — M79609 Pain in unspecified limb: Secondary | ICD-10-CM

## 2013-11-04 DIAGNOSIS — M2041 Other hammer toe(s) (acquired), right foot: Secondary | ICD-10-CM

## 2013-11-04 DIAGNOSIS — M79671 Pain in right foot: Secondary | ICD-10-CM

## 2013-11-04 DIAGNOSIS — S82401D Unspecified fracture of shaft of right fibula, subsequent encounter for closed fracture with routine healing: Secondary | ICD-10-CM

## 2013-11-04 DIAGNOSIS — E1140 Type 2 diabetes mellitus with diabetic neuropathy, unspecified: Secondary | ICD-10-CM

## 2013-11-04 DIAGNOSIS — E1142 Type 2 diabetes mellitus with diabetic polyneuropathy: Secondary | ICD-10-CM

## 2013-11-04 DIAGNOSIS — M2040 Other hammer toe(s) (acquired), unspecified foot: Secondary | ICD-10-CM

## 2013-11-04 DIAGNOSIS — E1149 Type 2 diabetes mellitus with other diabetic neurological complication: Secondary | ICD-10-CM

## 2013-11-04 NOTE — Progress Notes (Signed)
   Subjective:    Patient ID: Savannah [name], female    DOB: 07-Dec-1955, 58 y.o.   MRN: [id number]  HPI Comments: DOS 09/07/2013 right foot.  Pt states she feels she is still having a lot of pain and swelling in the right ankle, pain from pressure over the fracture site, and with long periods of weightbearing pain and swelling occur.     Review of Systems no new findings or systemic changes noted patient still some significant tenderness and edema the sinus tarsi lateral ankle or anterior to the lateral ankle area on the right.     Objective:   Physical Exam Assessment #1 nondisplaced fibular fracture appears to heal well x-rays reveal good consolidation however there is soft tissue swelling cannot rule out possible ganglion cyst or a lipoma within the sinus tarsi or possibly anterior talofibular ligament or injury with subsequent scarring edema in the sinus tarsi area. The digits appeared in good position mild digital contractures noted at the distal IP joints proximal IP screw be successfully stabilized or fibrosed continue with the toe brace for the second toe as it is a tour retainer however may discontinue air fracture boot and return to come for walking tennis or athletic shoes at this time. Patient wearing new balance type shoes incisions clean dry well coapted at this time on all digits       Assessment & Plan:  Assessment good postop progress following hammertoe repair multiple toes right foot as was hallux hammertoe repair with retained screw fixation the hallux continues to have soft tissue swelling and tenderness of the sinus tarsi lateral ankle gutter area on the right if no improvement within the next month consider an MRI evaluation for possible soft tissue mass ganglion cyst or fibrosis with chronic ankle instability are this time will maintain of the ice and compression stocking is recommended for the right ankle avoid any unstable surfaces maintain the appropriate shoe at all  times recheck in one month for long-term postop followup with x-rays and reevaluation. Next  Harriet Masson DPM

## 2013-11-04 NOTE — Patient Instructions (Signed)
ICE INSTRUCTIONS  Apply ice or cold pack to the affected area at least 3 times a day for 10-15 minutes each time.  You should also use ice after prolonged activity or vigorous exercise.  Do not apply ice longer than 20 minutes at one time.  Always keep a cloth between your skin and the ice pack to prevent burns.  Being consistent and following these instructions will help control your symptoms.  We suggest you purchase a gel ice pack because they are reusable and do bit leak.  Some of them are designed to wrap around the area.  Use the method that works best for you.  Here are some other suggestions for icing.   Use a frozen bag of peas or corn-inexpensive and molds well to your body, usually stays frozen for 10 to 20 minutes.  Wet a towel with cold water and squeeze out the excess until it's damp.  Place in a bag in the freezer for 20 minutes. Then remove and use.  Up obtain and maintain compression stocking wear support hose or knee-high stocking to help reduce the swelling and edema of the right ankle

## 2013-11-21 ENCOUNTER — Encounter: Payer: Self-pay | Admitting: Gastroenterology

## 2013-11-25 ENCOUNTER — Encounter: Payer: Self-pay | Admitting: Gastroenterology

## 2013-11-29 ENCOUNTER — Other Ambulatory Visit: Payer: Self-pay | Admitting: Endocrinology

## 2013-11-30 ENCOUNTER — Other Ambulatory Visit: Payer: Self-pay | Admitting: Endocrinology

## 2013-12-01 ENCOUNTER — Ambulatory Visit (INDEPENDENT_AMBULATORY_CARE_PROVIDER_SITE_OTHER): Payer: Managed Care, Other (non HMO)

## 2013-12-01 VITALS — BP 132/84 | HR 70 | Resp 17

## 2013-12-01 DIAGNOSIS — R52 Pain, unspecified: Secondary | ICD-10-CM

## 2013-12-01 DIAGNOSIS — D1720 Benign lipomatous neoplasm of skin and subcutaneous tissue of unspecified limb: Secondary | ICD-10-CM

## 2013-12-01 DIAGNOSIS — S82401D Unspecified fracture of shaft of right fibula, subsequent encounter for closed fracture with routine healing: Secondary | ICD-10-CM

## 2013-12-01 DIAGNOSIS — Z9889 Other specified postprocedural states: Secondary | ICD-10-CM

## 2013-12-01 DIAGNOSIS — M6740 Ganglion, unspecified site: Secondary | ICD-10-CM

## 2013-12-01 DIAGNOSIS — M79671 Pain in right foot: Secondary | ICD-10-CM

## 2013-12-01 NOTE — Progress Notes (Signed)
   Subjective:    Patient ID: Savannah [name], female    DOB: Mar 04, 1955, 58 y.o.   MRN: [id number]  HPI  Pt presents with continuing right ankle pain and swelling  Review of Systems no new findings or systemic changes noted     Objective:   Physical Exam Patient's neurovascular status is intact and unchanged she is having some increased swelling and pain and lateral ankle sinus tarsi area left a much of the malleolus however anterior to the malleolus has a painful soft tissue pliable lesion that is becoming more indurated and tender. Patient did have a nondisplaced fracture the fibula which x-rays demonstrate this time peas to be healing well patient also is healing consolidation of all the arthroplasty sites as well as first toe arthrodesis site. Incision is well coapted and healed in the right foot this time palpable soft tissue mass in the sinus tarsi either lipoma or ganglion cyst or suspected x-rays reveal no osseous involvement or abnormality with good consolidation of fractures and osteotomies being noted on ankle and foot views. However there is soft tissue mass identified on ultrasound. Ultrasound carried out with an 8 MHz active and passive probing of the sinus tarsi lateral ankle region which demonstrates possibly of fatty fibrofatty take lesion or cyst just anterior to the lateral fibula or lateral malleolus. This appears to not detach with bone underwent underneath does not show fluid and has been more of a fatty tissue type enhancement identified on ultrasound. There may be some interstitial fluid within the lesion itself remainder of exam unremarkable orthopedic biomechanical exam unremarkable incision incision is well coapted skin intact and unremarkable       Assessment & Plan:  Assessment good postop progress following multiple toe repair and hallux IP joint fusions. All wounds are well coapted patient ready for discharge from a surgical standpoint patient did have a fracture  the right ankle that occurred during the postoperative period that appears to be consolidating well however he continues have residual edema in the sinus tarsi which on exam appears to be more of a cyst either ganglion or a lipoma type cyst in the sinus tarsi area right ankle this time attempted aspiration however infiltrated the area with 10 mg Kenalog in 20 mg Xylocaine plain. Reevaluate with the next 2 weeks if no significant improvement may be K. for excisional biopsy. Reschedule in 2 weeks for followup in the interim maintain compression stocking and ice all times  Harriet Masson DPM

## 2013-12-01 NOTE — Patient Instructions (Signed)
ICE INSTRUCTIONS  Apply ice or cold pack to the affected area at least 3 times a day for 10-15 minutes each time.  You should also use ice after prolonged activity or vigorous exercise.  Do not apply ice longer than 20 minutes at one time.  Always keep a cloth between your skin and the ice pack to prevent burns.  Being consistent and following these instructions will help control your symptoms.  We suggest you purchase a gel ice pack because they are reusable and do bit leak.  Some of them are designed to wrap around the area.  Use the method that works best for you.  Here are some other suggestions for icing.   Use a frozen bag of peas or corn-inexpensive and molds well to your body, usually stays frozen for 10 to 20 minutes.  Wet a towel with cold water and squeeze out the excess until it's damp.  Place in a bag in the freezer for 20 minutes. Then remove and use.  Alternating hot and cold compresses to the ankle area 2 or 3 times daily as instructed  Maintain compression stockings at all times

## 2013-12-04 ENCOUNTER — Encounter: Payer: Self-pay | Admitting: Endocrinology

## 2013-12-04 ENCOUNTER — Ambulatory Visit (INDEPENDENT_AMBULATORY_CARE_PROVIDER_SITE_OTHER): Payer: Managed Care, Other (non HMO) | Admitting: Endocrinology

## 2013-12-04 VITALS — BP 132/60 | HR 75 | Temp 98.3°F | Ht 67.5 in | Wt 218.0 lb

## 2013-12-04 DIAGNOSIS — N289 Disorder of kidney and ureter, unspecified: Secondary | ICD-10-CM | POA: Diagnosis not present

## 2013-12-04 DIAGNOSIS — E559 Vitamin D deficiency, unspecified: Secondary | ICD-10-CM

## 2013-12-04 DIAGNOSIS — E119 Type 2 diabetes mellitus without complications: Secondary | ICD-10-CM

## 2013-12-04 DIAGNOSIS — E039 Hypothyroidism, unspecified: Secondary | ICD-10-CM | POA: Diagnosis not present

## 2013-12-04 DIAGNOSIS — D509 Iron deficiency anemia, unspecified: Secondary | ICD-10-CM | POA: Diagnosis not present

## 2013-12-04 LAB — BASIC METABOLIC PANEL
BUN: 15 mg/dL (ref 6–23)
CHLORIDE: 105 meq/L (ref 96–112)
CO2: 25 mEq/L (ref 19–32)
Calcium: 9.6 mg/dL (ref 8.4–10.5)
Creatinine, Ser: 0.8 mg/dL (ref 0.4–1.2)
GFR: 76.84 mL/min (ref >60.00)
Glucose, Bld: 206 mg/dL — ABNORMAL HIGH (ref 70–99)
Potassium: 4 mEq/L (ref 3.5–5.1)
SODIUM: 138 meq/L (ref 135–145)

## 2013-12-04 LAB — HEMOGLOBIN A1C: HEMOGLOBIN A1C: 6.8 % — AB (ref 4.6–6.5)

## 2013-12-04 LAB — CBC WITH DIFFERENTIAL/PLATELET
Basophils Absolute: 0 10*3/uL (ref 0.0–0.1)
Basophils Relative: 0.4 % (ref 0.0–3.0)
EOS PCT: 1.9 % (ref 0.0–5.0)
Eosinophils Absolute: 0.1 10*3/uL (ref 0.0–0.7)
HEMATOCRIT: 36.3 % (ref 36.0–46.0)
Hemoglobin: 12 g/dL (ref 12.0–15.0)
LYMPHS ABS: 1.9 10*3/uL (ref 0.7–4.0)
Lymphocytes Relative: 29.5 % (ref 12.0–46.0)
MCHC: 33 g/dL (ref 30.0–36.0)
MCV: 87.4 fl (ref 78.0–100.0)
MONO ABS: 0.4 10*3/uL (ref 0.1–1.0)
Monocytes Relative: 5.9 % (ref 3.0–12.0)
NEUTROS ABS: 4.1 10*3/uL (ref 1.4–7.7)
Neutrophils Relative %: 62.3 % (ref 43.0–77.0)
Platelets: 170 10*3/uL (ref 150.0–400.0)
RBC: 4.16 Mil/uL (ref 3.87–5.11)
RDW: 14.3 % (ref 11.5–15.5)
WBC: 6.6 10*3/uL (ref 4.0–10.5)

## 2013-12-04 LAB — IBC PANEL
IRON: 74 ug/dL (ref 42–145)
Saturation Ratios: 19 % — ABNORMAL LOW (ref 20.0–50.0)
Transferrin: 277.7 mg/dL (ref 212.0–360.0)

## 2013-12-04 LAB — VITAMIN D 25 HYDROXY (VIT D DEFICIENCY, FRACTURES): VITD: 49.42 ng/mL (ref 30.00–100.00)

## 2013-12-04 LAB — TSH: TSH: 0.02 u[IU]/mL — AB (ref 0.35–4.50)

## 2013-12-04 MED ORDER — OXYCODONE HCL 10 MG PO TABS: 10.0000 mg | ORAL_TABLET | ORAL | Status: DC | PRN

## 2013-12-04 MED ORDER — PIOGLITAZONE HCL 30 MG PO TABS: 30.0000 mg | ORAL_TABLET | Freq: Every day | ORAL | Status: DC

## 2013-12-04 MED ORDER — LEVOTHYROXINE SODIUM 125 MCG PO TABS: 125.0000 ug | ORAL_TABLET | Freq: Every day | ORAL | Status: DC

## 2013-12-04 NOTE — Progress Notes (Signed)
Subjective:    Patient ID: Savannah [name], female    DOB: January 15, 1956, 58 y.o.   MRN: [id number]  HPI Pt is here for regular wellness examination, and is feeling pretty well in general, and says chronic med probs are stable, except as noted below Past Medical History  Diagnosis Date  . Unspecified hypothyroidism 01/07/2007  . 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 HYPERCHOLESTEROLEMIA 04/18/2009  . HYPOKALEMIA 01/15/2007  . ANEMIA, IRON DEFICIENCY 12/06/2008  . OBSTRUCTIVE SLEEP APNEA 08/09/2009  . HYPERTENSION 09/27/2006  . ALLERGIC RHINITIS CAUSE UNSPECIFIED 12/29/2007  . ASTHMA 09/27/2006  . FATTY LIVER DISEASE 03/19/2008  . URINARY CALCULUS 11/08/2009  . 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 BACK PAIN, LUMBAR 12/29/2009  . Palpitations 05/23/2009  . ASYMPTOMATIC POSTMENOPAUSAL STATUS 03/19/2008  . Chronic kidney disease     Past Surgical History  Procedure Laterality Date  . Knee arthroscopy Left 1993  . Cardiac catheterization  2011  . Compound fracture Right     arm    History   Social History  . Marital Status: Married    Spouse Name: N/A    Number of Children: 2  . Years of Education: N/A   Occupational History  . Homemaker    Social History Main Topics  . Smoking status: Former Smoker -- 0.30 packs/day for 2 years    Types: Cigarettes    Quit date: 02/27/1975  . Smokeless tobacco: Never Used  . Alcohol Use: No  . Drug Use: No  . Sexual Activity: Not on file   Other Topics Concern  . 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 albuterol (PROVENTIL HFA;VENTOLIN HFA) 108 (90 BASE) MCG/ACT inhaler Inhale 2 puffs into the lungs every 4 (four) hours as needed for wheezing or shortness of breath (cough, shortness of breath or wheezing.).  1 Inhaler  12  . atorvastatin (LIPITOR) 40 MG tablet TAKE 1 TABLET DAILY  90 tablet  2  . citalopram  (CELEXA) 40 MG tablet TAKE ONE TABLET BY MOUTH ONCE DAILY  90 tablet  0  . gabapentin (NEURONTIN) 300 MG capsule Take 1 capsule (300 mg total) by mouth at bedtime.  30 capsule  5  . hyoscyamine (LEVSIN SL) 0.125 MG SL tablet Take 1-2 tablets by mouth or under tongue every 4 hours as needed.  100 tablet  5  . losartan-hydrochlorothiazide (HYZAAR) 100-12.5 MG per tablet TAKE 1 TABLET DAILY  90 tablet  1  . metFORMIN (GLUCOPHAGE-XR) 500 MG 24 hr tablet TAKE 2 TABLETS TWICE A DAY  360 tablet  2  . metoprolol succinate (TOPROL-XL) 25 MG 24 hr tablet TAKE 1 TABLET BY MOUTH EVERY DAY  90 tablet  1  . mometasone-formoterol (DULERA) 100-5 MCG/ACT AERO Inhale 2 puffs into the lungs 2 (two) times daily.  1 Inhaler  11  . ondansetron (ZOFRAN) 4 MG tablet Take 1 tablet (4 mg total) by mouth every 6 (six) hours as needed for nausea.  30 tablet  1  . traZODone (DESYREL) 100 MG tablet Take 1 tablet (100 mg total) by mouth at bedtime.  90 tablet  6   No current facility-administered medications on file prior to visit.    Allergies  Allergen Reactions  . Erythromycin     REACTION: nausea/vomiting    Family History  Problem Relation Age of Onset  . Colon  cancer Mother 59  . Diabetes Mother   . Kidney disease Mother   . Heart attack Father   . Heart disease Father   . Allergies Sister   . Asthma Sister   . Heart disease Paternal Grandfather   . Clotting disorder Grandchild     BP 132/60  Pulse 75  Temp(Src) 98.3 F (36.8 C) (Oral)  Ht 5' 7.5" (1.715 m)  Wt 218 lb (98.884 kg)  BMI 33.62 kg/m2  SpO2 95%     Review of Systems  Constitutional: Negative for fever.  HENT: Negative for hearing loss.   Eyes: Negative for visual disturbance.  Respiratory: Negative for shortness of breath.   Gastrointestinal: Negative for anal bleeding.  Endocrine: Negative for cold intolerance.  Genitourinary: Negative for hematuria.  Musculoskeletal: Positive for back pain.  Skin: Negative for rash.    Allergic/Immunologic: Positive for environmental allergies.  Neurological: Negative for syncope.       Numbness of the feet.  Hematological: Bruises/bleeds easily.  Psychiatric/Behavioral:       Mild depression       Objective:   Physical Exam VS: see vs page GEN: no distress HEAD: head: no deformity eyes: no periorbital swelling, no proptosis external nose and ears are normal mouth: no lesion seen NECK: supple, thyroid is not enlarged CHEST WALL: no deformity LUNGS:  Clear to auscultation BREASTS: sees gyn CV: reg rate and rhythm, no murmur ABD: abdomen is soft, nontender.  no hepatosplenomegaly.  not distended.  no hernia. GENITALIA/RECTAL: sees gyn MUSCULOSKELETAL: muscle bulk and strength are grossly normal.  no obvious joint swelling.  gait is normal and steady.  EXTEMITIES: no deformity.  no ulcer on the feet.  feet are of normal color and temp.  no edema.  There is bilateral onychomycosis PULSES: dorsalis pedis intact bilat.  no carotid bruit.   NEURO:  cn 2-12 grossly intact.   readily moves all 4's.  sensation is intact to touch on the feet, but decreased from normal.   SKIN:  Normal texture and temperature.  No rash or suspicious lesion is visible.   NODES:  None palpable at the neck.   PSYCH: alert, well-oriented.  Does not appear anxious nor depressed.    electrocardiogram is declined.      Assessment & Plan:    Patient is advised the following: Patient Instructions  please consider these measures for your health:  minimize alcohol.  do not use tobacco products.  have a colonoscopy at least every 10 years from age 48.  Women should have an annual mammogram from age 57.  keep firearms safely stored.  always use seat belts.  have working smoke alarms in your home.  see an eye doctor and dentist regularly.  never drive under the influence of alcohol or drugs (including prescription drugs).  those with fair skin should take precautions against the sun.   blood  tests are being requested for you today.  We'll contact you with results. here are some tests for blood in the bowels.  please follow the instructions, and return to the lab. Please come back for a follow-up appointment in 6 months.      SEPARATE EVALUATION FOLLOWS--EACH PROBLEM HERE IS NEW, NOT RESPONDING TO TREATMENT, OR POSES SIGNIFICANT RISK TO THE PATIENT'S HEALTH: HISTORY OF THE PRESENT ILLNESS: Hypothyroidism: denies weight change DM: denies chest pain PAST MEDICAL HISTORY reviewed and up to date today REVIEW OF SYSTEMS: Denies edema. PHYSICAL EXAMINATION: VITAL SIGNS:  See vs page GENERAL: no distress  NECK: There is no palpable thyroid enlargement.  No thyroid nodule is palpable.  No palpable lymphadenopathy at the anterior neck.  LAB/XRAY RESULTS: Lab Results  Component Value Date   WBC 6.6 12/04/2013   HGB 12.0 12/04/2013   HCT 36.3 12/04/2013   PLT 170.0 12/04/2013   GLUCOSE 206* 12/04/2013   CHOL 125 08/07/2013   TRIG 174.0* 08/07/2013   HDL 42.60 08/07/2013   LDLDIRECT 135.7 08/01/2012   LDLCALC 48 08/07/2013   ALT 30 08/07/2013   AST 28 08/07/2013   NA 138 12/04/2013   K 4.0 12/04/2013   CL 105 12/04/2013   CREATININE 0.8 12/04/2013   BUN 15 12/04/2013   CO2 25 12/04/2013   TSH 0.02* 12/04/2013   INR 1.1 ratio* 05/23/2009   HGBA1C 6.8* 12/04/2013   MICROALBUR 2.5* 08/07/2013  IMPRESSION: Hypothyroidism, overreplaced.   DM: mild exacerbation PLAN:  We need to reduce the thyroid pill, and to increase the pioglitizone. i have sent prescriptions to your pharmacy, for these. Please redo the thyroid blood test, here in the office in 4-6 weeks.

## 2013-12-04 NOTE — Patient Instructions (Addendum)
please consider these measures for your health:  minimize alcohol.  do not use tobacco products.  have a colonoscopy at least every 10 years from age 59.  Women should have an annual mammogram from age 63.  keep firearms safely stored.  always use seat belts.  have working smoke alarms in your home.  see an eye doctor and dentist regularly.  never drive under the influence of alcohol or drugs (including prescription drugs).  those with fair skin should take precautions against the sun.   blood tests are being requested for you today.  We'll contact you with results. here are some tests for blood in the bowels.  please follow the instructions, and return to the lab. Please come back for a follow-up appointment in 6 months.

## 2013-12-15 ENCOUNTER — Ambulatory Visit (INDEPENDENT_AMBULATORY_CARE_PROVIDER_SITE_OTHER): Payer: Managed Care, Other (non HMO)

## 2013-12-15 DIAGNOSIS — Z9889 Other specified postprocedural states: Secondary | ICD-10-CM

## 2013-12-15 DIAGNOSIS — E1140 Type 2 diabetes mellitus with diabetic neuropathy, unspecified: Secondary | ICD-10-CM

## 2013-12-15 DIAGNOSIS — M2031 Hallux varus (acquired), right foot: Secondary | ICD-10-CM

## 2013-12-15 DIAGNOSIS — M2041 Other hammer toe(s) (acquired), right foot: Secondary | ICD-10-CM

## 2013-12-15 DIAGNOSIS — M79671 Pain in right foot: Secondary | ICD-10-CM

## 2013-12-15 DIAGNOSIS — S82401D Unspecified fracture of shaft of right fibula, subsequent encounter for closed fracture with routine healing: Secondary | ICD-10-CM

## 2013-12-15 NOTE — Patient Instructions (Signed)
ICE INSTRUCTIONS  Apply ice or cold pack to the affected area at least 3 times a day for 10-15 minutes each time.  You should also use ice after prolonged activity or vigorous exercise.  Do not apply ice longer than 20 minutes at one time.  Always keep a cloth between your skin and the ice pack to prevent burns.  Being consistent and following these instructions will help control your symptoms.  We suggest you purchase a gel ice pack because they are reusable and do bit leak.  Some of them are designed to wrap around the area.  Use the method that works best for you.  Here are some other suggestions for icing.   Use a frozen bag of peas or corn-inexpensive and molds well to your body, usually stays frozen for 10 to 20 minutes.  Wet a towel with cold water and squeeze out the excess until it's damp.  Place in a bag in the freezer for 20 minutes. Then remove and use.  Alternate applying hot compress ice pack to the affected areas of the ankle and forefoot as needed

## 2013-12-15 NOTE — Progress Notes (Signed)
   Subjective:    Patient ID: Savannah [name], female    DOB: 11/02/1955, 58 y.o.   MRN: [id number]  HPI Comments: DOS 09/07/2013 Right hallux IPJ Fusion, 2, 3, 4 hammer toe repair with pins, and 5 hammer toe repair.  Pt states she is doing a little better and able to get around a little better in her everyday shoes.     Review of Systems no new findings or systemic changes noted     Objective:   Physical Exam Neurovascular status is intact still has edema, area of the sinus tarsi right ankle toes are doing well the hallux is rectus with good fixation noted on x-ray lesser digits have has recurrence of contracture second toe and third toe somewhat tender on palpation range of motion. 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

## 2013-12-18 ENCOUNTER — Other Ambulatory Visit: Payer: Self-pay | Admitting: Endocrinology

## 2014-01-25 ENCOUNTER — Ambulatory Visit (AMBULATORY_SURGERY_CENTER): Payer: Self-pay | Admitting: *Deleted

## 2014-01-25 VITALS — Ht 67.5 in | Wt 234.0 lb

## 2014-01-25 DIAGNOSIS — Z800 Family history of malignant neoplasm of digestive organs: Secondary | ICD-10-CM

## 2014-01-25 DIAGNOSIS — K253 Acute gastric ulcer without hemorrhage or perforation: Secondary | ICD-10-CM

## 2014-01-25 MED ORDER — MOVIPREP 100 G PO SOLR
ORAL | Status: DC
Start: 2014-01-25 — End: 2014-02-08

## 2014-01-25 NOTE — Progress Notes (Signed)
Patient denies any allergies to eggs or soy. Patient denies any problems with anesthesia/sedation. Patient denies any oxygen use at home and does not take any diet/weight loss medications. EMMI education assisgned to patient on colonoscopy & EGD, this was explained and instructions given to patient. 

## 2014-01-28 ENCOUNTER — Other Ambulatory Visit: Payer: Self-pay

## 2014-02-08 ENCOUNTER — Encounter: Payer: Self-pay | Admitting: Gastroenterology

## 2014-02-08 ENCOUNTER — Telehealth: Payer: Self-pay | Admitting: *Deleted

## 2014-02-08 ENCOUNTER — Ambulatory Visit (AMBULATORY_SURGERY_CENTER): Payer: Commercial Indemnity | Admitting: Gastroenterology

## 2014-02-08 VITALS — BP 144/46 | HR 69 | Temp 98.7°F | Resp 23 | Ht 67.5 in | Wt 232.0 lb

## 2014-02-08 DIAGNOSIS — Z1211 Encounter for screening for malignant neoplasm of colon: Secondary | ICD-10-CM

## 2014-02-08 DIAGNOSIS — D123 Benign neoplasm of transverse colon: Secondary | ICD-10-CM

## 2014-02-08 DIAGNOSIS — K2950 Unspecified chronic gastritis without bleeding: Secondary | ICD-10-CM

## 2014-02-08 DIAGNOSIS — Z800 Family history of malignant neoplasm of digestive organs: Secondary | ICD-10-CM

## 2014-02-08 DIAGNOSIS — K253 Acute gastric ulcer without hemorrhage or perforation: Secondary | ICD-10-CM

## 2014-02-08 DIAGNOSIS — K2970 Gastritis, unspecified, without bleeding: Secondary | ICD-10-CM

## 2014-02-08 HISTORY — PX: OTHER SURGICAL HISTORY: SHX169

## 2014-02-08 LAB — GLUCOSE, CAPILLARY
Glucose-Capillary: 93 mg/dL (ref 70–99)
Glucose-Capillary: 90 mg/dL (ref 70–99)

## 2014-02-08 MED ORDER — SODIUM CHLORIDE 0.9 % IV SOLN: 500.0000 mL | INTRAVENOUS | Status: DC

## 2014-02-08 MED ORDER — OMEPRAZOLE 40 MG PO CPDR: 40.0000 mg | DELAYED_RELEASE_CAPSULE | Freq: Every day | ORAL | Status: DC

## 2014-02-08 NOTE — Progress Notes (Signed)
Pt coughing suctioned well, HOB elevated awake, to RR stable

## 2014-02-08 NOTE — Progress Notes (Signed)
Called to room to assist during endoscopic procedure.  Patient ID and intended procedure confirmed with present staff. Received instructions for my participation in the procedure from the performing physician.  

## 2014-02-08 NOTE — Patient Instructions (Signed)
Avoid NSAIDS, (Motrin,Advil,Aleve etc)    YOU HAD AN ENDOSCOPIC PROCEDURE TODAY AT [facility]: Refer to the procedure report that was given to you for any specific questions about what was found during the examination.  If the procedure report does not answer your questions, please call your gastroenterologist to clarify.  If you requested that your care partner not be given the details of your procedure findings, then the procedure report has been included in a sealed envelope for you to review at your convenience later.  YOU SHOULD EXPECT: Some feelings of bloating in the abdomen. Passage of more gas than usual.  Walking can help get rid of the air that was put into your GI tract during the procedure and reduce the bloating. If you had a lower endoscopy (such as a colonoscopy or flexible sigmoidoscopy) you may notice spotting of blood in your stool or on the toilet paper. If you underwent a bowel prep for your procedure, then you may not have a normal bowel movement for a few days.  DIET: Your first meal following the procedure should be a light meal and then it is ok to progress to your normal diet.  A half-sandwich or bowl of soup is an example of a good first meal.  Heavy or fried foods are harder to digest and may make you feel nauseous or bloated.  Likewise meals heavy in dairy and vegetables can cause extra gas to form and this can also increase the bloating.  Drink plenty of fluids but you should avoid alcoholic beverages for 24 hours.  ACTIVITY: Your care partner should take you home directly after the procedure.  You should plan to take it easy, moving slowly for the rest of the day.  You can resume normal activity the day after the procedure however you should NOT DRIVE or use heavy machinery for 24 hours (because of the sedation medicines used during the test).    SYMPTOMS TO REPORT IMMEDIATELY: A gastroenterologist can be reached at any hour.  During normal business  hours, 8:30 AM to 5:00 PM Monday through Friday, call [phone].  After hours and on weekends, please call the GI answering service at [phone] who will take a message and have the physician on call contact you.   Following lower endoscopy (colonoscopy or flexible sigmoidoscopy):  Excessive amounts of blood in the stool  Significant tenderness or worsening of abdominal pains  Swelling of the abdomen that is new, acute  Fever of 100F or higher  Following upper endoscopy (EGD)  Vomiting of blood or coffee ground material  New chest pain or pain under the shoulder blades  Painful or persistently difficult swallowing  New shortness of breath  Fever of 100F or higher  Black, tarry-looking stools  FOLLOW UP: If any biopsies were taken you will be contacted by phone or by letter within the next 1-3 weeks.  Call your gastroenterologist if you have not heard about the biopsies in 3 weeks.  Our staff will call the home number listed on your records the next business day following your procedure to check on you and address any questions or concerns that you may have at that time regarding the information given to you following your procedure. This is a courtesy call and so if there is no answer at the home number and we have not heard from you through the emergency physician on call, we will assume that you have returned to your regular daily activities without  incident.  SIGNATURES/CONFIDENTIALITY: You and/or your care partner have signed paperwork which will be entered into your electronic medical record.  These signatures attest to the fact that that the information above on your After Visit Summary has been reviewed and is understood.  Full responsibility of the confidentiality of this discharge information lies with you and/or your care-partner.

## 2014-02-08 NOTE — Telephone Encounter (Signed)
Patient's omeprazole 40 mg once daily has been approved through [employer] from 01/09/14-02/08/15 per Verdis Frederickson. Case ID: [id number]

## 2014-02-08 NOTE — Op Note (Signed)
[place]  [address]. [place], [id number]   ENDOSCOPY PROCEDURE REPORT  PATIENT: Savannah, [name]  MR#: [id number] BIRTHDATE: 1955/07/27 , 37  yrs. old GENDER: female ENDOSCOPIST: Ladene Artist, MD, [facility] PROCEDURE DATE:  02/08/2014 PROCEDURE:  EGD w/ biopsy ASA CLASS:     Class III INDICATIONS:  Follow up gastric ulcer. MEDICATIONS: Residual sedation present, Monitored anesthesia care, and Propofol 150 mg IV TOPICAL ANESTHETIC: none DESCRIPTION OF PROCEDURE: After the risks benefits and alternatives of the procedure were thoroughly explained, informed consent was obtained.  The LB [id number] endoscope was introduced through the mouth and advanced to the second portion of the duodenum , Without limitations.  The instrument was slowly withdrawn as the mucosa was fully examined.    STOMACH: Focal gastritis was found on the lesser curvature of the gastric antrum at the site of the prior ulcer.  Multiple biopsies were performed.   The stomach otherwise appeared normal. ESOPHAGUS: The mucosa of the esophagus appeared normal. DUODENUM: The duodenal mucosa showed no abnormalities.  Retroflexed views revealed a small hiatal hernia.  The scope was then withdrawn from the patient and the procedure completed.  COMPLICATIONS: There were no immediate complications.  ENDOSCOPIC IMPRESSION: 1.   Gastritis on the lesser curvature of the gastric antrum; multiple biopsies performed 2.   Small  hiatal hernia 3.   The EGD otherwise appeared normal  RECOMMENDATIONS: 1.  Anti-reflux regimen 2.  Await pathology results 3.  Continue PPI long term: omeprazole 40 mg po daily, 1 year of refills 4.  Avoid NSAIDS  eSigned:  Ladene Artist, MD, [facility] 02/08/2014 2:09 PM

## 2014-02-08 NOTE — Op Note (Signed)
[place]  [address] [place], [id number]   COLONOSCOPY PROCEDURE REPORT  PATIENT: Lilja, Soland  MR#: [id number] BIRTHDATE: Nov 15, 1955 , 37  yrs. old GENDER: female ENDOSCOPIST: Ladene Artist, MD, [facility] PROCEDURE DATE:  02/08/2014 PROCEDURE:   Colonoscopy with snare polypectomy First Screening Colonoscopy - Avg.  risk and is 50 yrs.  old or older - No.  Prior Negative Screening - Now for repeat screening. N/A  History of Adenoma - Now for follow-up colonoscopy & has been > or = to 3 yrs.  N/A  Polyps Removed Today? Yes. ASA CLASS:   Class III INDICATIONS:patient's immediate family history of colon cancer. MEDICATIONS: Monitored anesthesia care and Propofol 300 mg IV DESCRIPTION OF PROCEDURE:   After the risks benefits and alternatives of the procedure were thoroughly explained, informed consent was obtained.  The digital rectal exam revealed no abnormalities of the rectum.   The LB [id number]  endoscope was introduced through the anus and advanced to the cecum, which was identified by both the appendix and ileocecal valve. No adverse events experienced with a tortuous colon.   The quality of the prep was good, using MoviPrep  The instrument was then slowly withdrawn as the colon was fully examined.  COLON FINDINGS: Two sessile polyps measuring 6-7 mm in size were found in the transverse colon.  A polypectomy was performed with a cold snare.  The resection was complete, the polyp tissue was completely retrieved and sent to histology.   The examination was otherwise normal.  Retroflexed views revealed internal Grade I hemorrhoids. The time to cecum=5 minutes 32 seconds.  Withdrawal time=13 minutes 18 seconds.  The scope was withdrawn and the procedure completed. COMPLICATIONS: There were no immediate complications.  ENDOSCOPIC IMPRESSION: 1.   Two sessile polyps in the transverse colon; polypectomy performed with a cold snare 2.   Grade l  internal hemorrhoids  RECOMMENDATIONS: 1.  Await pathology results 2.  Repeat Colonoscopy in 5 years.  eSigned:  Ladene Artist, MD, [facility] 02/08/2014 1:56 PM

## 2014-02-09 ENCOUNTER — Telehealth: Payer: Self-pay

## 2014-02-09 ENCOUNTER — Ambulatory Visit (INDEPENDENT_AMBULATORY_CARE_PROVIDER_SITE_OTHER): Payer: Managed Care, Other (non HMO)

## 2014-02-09 VITALS — BP 135/62 | HR 72 | Resp 14

## 2014-02-09 DIAGNOSIS — M79671 Pain in right foot: Secondary | ICD-10-CM

## 2014-02-09 DIAGNOSIS — S9001XA Contusion of right ankle, initial encounter: Secondary | ICD-10-CM

## 2014-02-09 DIAGNOSIS — M25371 Other instability, right ankle: Secondary | ICD-10-CM

## 2014-02-09 DIAGNOSIS — S90121A Contusion of right lesser toe(s) without damage to nail, initial encounter: Secondary | ICD-10-CM

## 2014-02-09 DIAGNOSIS — D1720 Benign lipomatous neoplasm of skin and subcutaneous tissue of unspecified limb: Secondary | ICD-10-CM

## 2014-02-09 DIAGNOSIS — Z9889 Other specified postprocedural states: Secondary | ICD-10-CM

## 2014-02-09 MED ORDER — CEPHALEXIN 500 MG PO CAPS: 500.0000 mg | ORAL_CAPSULE | Freq: Three times a day (TID) | ORAL | Status: DC

## 2014-02-09 MED ORDER — TRIAMCINOLONE ACETONIDE 10 MG/ML IJ SUSP
10.0000 mg | Freq: Once | INTRAMUSCULAR | Status: DC
Start: 2014-02-09 — End: 2014-05-12

## 2014-02-09 NOTE — Telephone Encounter (Signed)
  Follow up Call-  Call back number 02/08/2014 12/12/2012 09/17/2012  Post procedure Call Back phone  # [phone] cell [phone]  Permission to leave phone message Yes Yes Yes     Patient questions:  Do you have a fever, pain , or abdominal swelling? No. Pain Score  0 *  Have you tolerated food without any problems? Yes.    Have you been able to return to your normal activities? Yes.    Do you have any questions about your discharge instructions: Diet   No. Medications  No. Follow up visit  No.  Do you have questions or concerns about your Care? No.  Actions: * If pain score is 4 or above: No action needed, pain <4.  No problems per the pt. maw

## 2014-02-09 NOTE — Patient Instructions (Signed)
ANTIBACTERIAL SOAP INSTRUCTIONS  THE DAY AFTER PROCEDURE  Please follow the instructions your doctor has marked.   Shower as usual. Before getting out, place a drop of antibacterial liquid soap (Dial) on a wet, clean washcloth.  Gently wipe washcloth over affected area.  Afterward, rinse the area with warm water.  Blot the area dry with a soft cloth and cover with antibiotic ointment (neosporin, polysporin, bacitracin) and band aid or gauze and tape  Place 3-4 drops of antibacterial liquid soap in a quart of warm tap water.  Submerge foot into water for 20 minutes.  If bandage was applied after your procedure, leave on to allow for easy lift off, then remove and continue with soak for the remaining time.  Next, blot area dry with a soft cloth and cover with a bandage.  Apply other medications as directed by your doctor, such as cortisporin otic solution (eardrops) or neosporin antibiotic ointment  After washing her soaking the right great toe in soap and water dry applying Neosporin and Band-Aid dressing daily until healed or resolved. Complete cephalexin antibiotic regimen as instructed for 10 days   ICE INSTRUCTIONS  Apply ice or cold pack to the affected area at least 3 times a day for 10-15 minutes each time.  You should also use ice after prolonged activity or vigorous exercise.  Do not apply ice longer than 20 minutes at one time.  Always keep a cloth between your skin and the ice pack to prevent burns.  Being consistent and following these instructions will help control your symptoms.  We suggest you purchase a gel ice pack because they are reusable and do bit leak.  Some of them are designed to wrap around the area.  Use the method that works best for you.  Here are some other suggestions for icing.   Use a frozen bag of peas or corn-inexpensive and molds well to your body, usually stays frozen for 10 to 20 minutes.  Wet a towel with cold water and squeeze out the excess until it's damp.   Place in a bag in the freezer for 20 minutes. Then remove and use.

## 2014-02-09 NOTE — Progress Notes (Addendum)
   Subjective:    Patient ID: Savannah [name], female    DOB: [birth year], 58 y.o.   MRN: [id number]  HPI Comments: DOS 09/07/2013 right hallux IPJ fusion, 2,3,4 hammer toe repair with pins and 5th hammer toe repair.  Pt states she hit the right 1st toe and now the toenail is green and loose.     Review of Systems no new findings or systemic changes noted     Objective:   Physical Exam Lower extremity objective findings as follows vascular status is intact DP +2 PT 1 over 4 bilateral patient continues to have paresthesia foot status post surgery right hallux fusion and has ankle sprain fracture bilateral malleolus on the right. Still wearing ankle stabilizer as instructed however she bumped her fit her right hallux the nails thick and discolored are some green discoloration of the toe on exam there appears to be a subungual hematoma and hemorrhage possibly early paronychia of the hallux. There is some mild bloody discharge drainage which on debridement expressed some subungual hematoma which is evacuated at this time without the need for anesthesia. Patient does have diabetes with neuropathy nail shows some fluctuance from the nailbed at this time is debrided away bloody discharge drainage is expressed not see purulence no malodor is noted. Remainder the exam unremarkable noncontributory. No ascending cell ice or lymphangitis noted       Assessment & Plan:  Assessment contusion toe with subungual hematoma right great toe with onychomycosis and dystrophy of nails well the remaining prominence of the ankle sprain is resolving maintain stabilizer continue with postop follow-ups for hallux IP fusion however this time the nail plate and nailbed of the right hallux is debrided with evacuation of subungual hematoma Neosporin and gauze dressing are applied and will maintain daily bandage changes prescription is given for cephalexin 500 mg 3 times a day 10 days will initiate soaks and anti-bacterial SOAP  or Betadine warm water or Epsom salts in warm water Neosporin and Band-Aid dressing daily recheck within next couple weeks for follow-up and repeat assessment if it fails to improve or resolve. Suggest follow-up in approximately one month for reevaluation  Harriet Masson DPM

## 2014-02-12 ENCOUNTER — Telehealth: Payer: Self-pay

## 2014-02-12 NOTE — Telephone Encounter (Signed)
Requested call back to discuss flu shot.

## 2014-02-13 ENCOUNTER — Other Ambulatory Visit: Payer: Self-pay | Admitting: Endocrinology

## 2014-02-19 ENCOUNTER — Encounter: Payer: Self-pay | Admitting: Gastroenterology

## 2014-02-26 DIAGNOSIS — C801 Malignant (primary) neoplasm, unspecified: Secondary | ICD-10-CM

## 2014-02-26 HISTORY — DX: Malignant (primary) neoplasm, unspecified: C80.1

## 2014-03-15 ENCOUNTER — Other Ambulatory Visit: Payer: Self-pay | Admitting: Endocrinology

## 2014-03-16 ENCOUNTER — Ambulatory Visit (INDEPENDENT_AMBULATORY_CARE_PROVIDER_SITE_OTHER): Payer: Managed Care, Other (non HMO)

## 2014-03-16 VITALS — BP 113/56 | HR 69 | Resp 18

## 2014-03-16 DIAGNOSIS — M79676 Pain in unspecified toe(s): Secondary | ICD-10-CM

## 2014-03-16 DIAGNOSIS — M2011 Hallux valgus (acquired), right foot: Secondary | ICD-10-CM

## 2014-03-16 DIAGNOSIS — B351 Tinea unguium: Secondary | ICD-10-CM

## 2014-03-16 DIAGNOSIS — E1140 Type 2 diabetes mellitus with diabetic neuropathy, unspecified: Secondary | ICD-10-CM

## 2014-03-16 DIAGNOSIS — S90121A Contusion of right lesser toe(s) without damage to nail, initial encounter: Secondary | ICD-10-CM

## 2014-03-16 MED ORDER — EFINACONAZOLE 10 % EX SOLN: CUTANEOUS | Status: DC

## 2014-03-16 NOTE — Patient Instructions (Signed)
Diabetes and Foot Care Diabetes may cause you to have problems because of poor blood supply (circulation) to your feet and legs. This may cause the skin on your feet to become thinner, break easier, and heal more slowly. Your skin may become dry, and the skin may peel and crack. You may also have nerve damage in your legs and feet causing decreased feeling in them. You may not notice minor injuries to your feet that could lead to infections or more serious problems. Taking care of your feet is one of the most important things you can do for yourself.  HOME CARE INSTRUCTIONS  Wear shoes at all times, even in the house. Do not go barefoot. Bare feet are easily injured.  Check your feet daily for blisters, cuts, and redness. If you cannot see the bottom of your feet, use a mirror or ask someone for help.  Wash your feet with warm water (do not use hot water) and mild soap. Then pat your feet and the areas between your toes until they are completely dry. Do not soak your feet as this can dry your skin.  Apply a moisturizing lotion or petroleum jelly (that does not contain alcohol and is unscented) to the skin on your feet and to dry, brittle toenails. Do not apply lotion between your toes.  Trim your toenails straight across. Do not dig under them or around the cuticle. File the edges of your nails with an emery board or nail file.  Do not cut corns or calluses or try to remove them with medicine.  Wear clean socks or stockings every day. Make sure they are not too tight. Do not wear knee-high stockings since they may decrease blood flow to your legs.  Wear shoes that fit properly and have enough cushioning. To break in new shoes, wear them for just a few hours a day. This prevents you from injuring your feet. Always look in your shoes before you put them on to be sure there are no objects inside.  Do not cross your legs. This may decrease the blood flow to your feet.  If you find a minor scrape,  cut, or break in the skin on your feet, keep it and the skin around it clean and dry. These areas may be cleansed with mild soap and water. Do not cleanse the area with peroxide, alcohol, or iodine.  When you remove an adhesive bandage, be sure not to damage the skin around it.  If you have a wound, look at it several times a day to make sure it is healing.  Do not use heating pads or hot water bottles. They may burn your skin. If you have lost feeling in your feet or legs, you may not know it is happening until it is too late.  Make sure your health care provider performs a complete foot exam at least annually or more often if you have foot problems. Report any cuts, sores, or bruises to your health care provider immediately. SEEK MEDICAL CARE IF:   You have an injury that is not healing.  You have cuts or breaks in the skin.  You have an ingrown nail.  You notice redness on your legs or feet.  You feel burning or tingling in your legs or feet.  You have pain or cramps in your legs and feet.  Your legs or feet are numb.  Your feet always feel cold. SEEK IMMEDIATE MEDICAL CARE IF:   There is increasing redness,   swelling, or pain in or around a wound.  There is a red line that goes up your leg.  Pus is coming from a wound.  You develop a fever or as directed by your health care provider.  You notice a bad smell coming from an ulcer or wound. Document Released: 02/10/2000 Document Revised: 10/15/2012 Document Reviewed: 07/22/2012 Exit[employer] Patient Information 2015 [employer]. This information is not intended to replace advice given to you by your health care provider. Make sure you discuss any questions you have with your health care provider.  

## 2014-03-16 NOTE — Progress Notes (Signed)
   Subjective:    Patient ID: Savannah [name], female    DOB: Jan 30, 1956, 59 y.o.   MRN: [id number]  HPI MY RIGHT ANKLE SWELLS SOME AND THE TOES ON THE RIGHT FOOT ARE GOOD AND THE LEFT BIG TOENAIL NEEDS TO BE TRIMMED UP AND I HAVE A CRACK ON THE LEFT BIG TOE    Review of Systems no new findings or systemic changes noted    Objective:   Physical Exam Patient is status post ankle sprain right wearing an AFO brace or May discontinue we off the brace is fairly stable does not wear it around the house is actually doing well functionally may also have always have some residual weakness in the ankle and edema. Patient's other concern is also resolved following contusion of the right hallux nail bed there is no discharge or drainage she's completed her anabolic section. Because of GI upset patient had been on antibiotic regimen however the toe is healed no discharge or drainage noted on the right great toe. Nail continues to have thickening restless discoloration and friability consistent with onychomycosis patient also has pain and discomfort paresthesia associated with diabetic neuropathy. Right hallux area also shows some separation from the nailbed with kyphosis discoloration in her ptosis incurvation of nails. Lateral nails are also slightly discolored and brittle although nonpainful or symptomatic at this time nor do they require debridement. Nails need debridement at this time. Patient is also candidate for mycotic nail care per her request my recommendation. There is some diffuse keratoses or pinch callus of the hallux left great toe at the IP joint. No open wounds or ulcers are noted no secondary infections noted       Assessment & Plan:  Assessment this time is resolved contusion and hematoma of right great toe stable or resolving ankle sprain right ankle slight ankle sprain/ankle fracture right ankle. Continue the use of AFO brace or ankle stabilizer. As far as the nails does have diabetes with  peripheral neuropathy and angiopathy issues and does have some paresthesias advised to continue with diabetic foot care as instructed is a candidate this time for debridement of nails did debride both hallux nails. Left hallux nail plate which is thick brittle crumbly friable painful symptomatically on palpation and on ambulation. She diabetic foot and nail care every 2-3 months as recommended. Keratoses is also debrided at this time to the hallux patient will use lotion on her feet daily as instructed return in 3 months for continued diabetic foot palliative nail care is needed in the future next . Patient also is prescribed Jublia at this time topical antifungal to apply the affected nails daily for a 12 month duration as instructed  Harriet Masson DPM

## 2014-04-27 ENCOUNTER — Other Ambulatory Visit: Payer: Self-pay | Admitting: Urology

## 2014-04-27 ENCOUNTER — Telehealth: Payer: Self-pay | Admitting: Pulmonary Disease

## 2014-04-27 DIAGNOSIS — G4733 Obstructive sleep apnea (adult) (pediatric): Secondary | ICD-10-CM

## 2014-04-27 NOTE — Telephone Encounter (Signed)
Order has been placed for new supplies. Pt is aware. Nothing further was needed.

## 2014-05-10 NOTE — Patient Instructions (Addendum)
Savannah [name]  05/10/2014   Your procedure is scheduled on: 05/19/2014    Report to [facility] Main  Entrance and follow signs to               [facility] at        [place].  Call this number if you have problems the morning of surgery [phone]   Remember:  Do not eat food or drink liquids :After Midnight.     Take these medicines the morning of surgery with A SIP OF WATER:   Albuterol Inhaler if needed and bring, Dulera Inhaler if needed and bring, Celexa, Synthroid, Toprol, Oxycodone if needed                                You may not have any metal on your body including hair pins and              piercings  Do not wear jewelry, make-up, lotions, powders or perfumes., deodorant.               Do not wear nail polish.  Do not shave  48 hours prior to surgery.              Do not bring valuables to the hospital. [place].  Contacts, dentures or bridgework may not be worn into surgery.  Leave suitcase in the car. After surgery it may be brought to your room.       Special Instructions: coughing and deep breathing exercises, leg exercises               Please read over the following fact sheets you were given: _____________________________________________________________________             [facility] - Preparing for Surgery Before surgery, you can play an important role.  Because skin is not sterile, your skin needs to be as free of germs as possible.  You can reduce the number of germs on your skin by washing with CHG (chlorahexidine gluconate) soap before surgery.  CHG is an antiseptic cleaner which kills germs and bonds with the skin to continue killing germs even after washing. Please DO NOT use if you have an allergy to CHG or antibacterial soaps.  If your skin becomes reddened/irritated stop using the CHG and inform your nurse when you arrive at Short Stay. Do not shave (including legs  and underarms) for at least 48 hours prior to the first CHG shower.  You may shave your face/neck. Please follow these instructions carefully:  1.  Shower with CHG Soap the night before surgery and the  morning of Surgery.  2.  If you choose to wash your hair, wash your hair first as usual with your  normal  shampoo.  3.  After you shampoo, rinse your hair and body thoroughly to remove the  shampoo.                           4.  Use CHG as you would any other liquid soap.  You can apply chg directly  to the skin and wash  Gently with a scrungie or clean washcloth.  5.  Apply the CHG Soap to your body ONLY FROM THE NECK DOWN.   Do not use on face/ open                           Wound or open sores. Avoid contact with eyes, ears mouth and genitals (private parts).                       Wash face,  Genitals (private parts) with your normal soap.             6.  Wash thoroughly, paying special attention to the area where your surgery  will be performed.  7.  Thoroughly rinse your body with warm water from the neck down.  8.  DO NOT shower/wash with your normal soap after using and rinsing off  the CHG Soap.                9.  Pat yourself dry with a clean towel.            10.  Wear clean pajamas.            11.  Place clean sheets on your bed the night of your first shower and do not  sleep with pets. Day of Surgery : Do not apply any lotions/deodorants the morning of surgery.  Please wear clean clothes to the hospital/surgery center.  FAILURE TO FOLLOW THESE INSTRUCTIONS MAY RESULT IN THE CANCELLATION OF YOUR SURGERY PATIENT SIGNATURE_________________________________  NURSE SIGNATURE__________________________________  ________________________________________________________________________  WHAT IS A BLOOD TRANSFUSION? Blood Transfusion Information  A transfusion is the replacement of blood or some of its parts. Blood is made up of multiple cells which provide different  functions.  Red blood cells carry oxygen and are used for blood loss replacement.  White blood cells fight against infection.  Platelets control bleeding.  Plasma helps clot blood.  Other blood products are available for specialized needs, such as hemophilia or other clotting disorders. BEFORE THE TRANSFUSION  Who gives blood for transfusions?   Healthy volunteers who are fully evaluated to make sure their blood is safe. This is blood bank blood. Transfusion therapy is the safest it has ever been in the practice of medicine. Before blood is taken from a donor, a complete history is taken to make sure that person has no history of diseases nor engages in risky social behavior (examples are intravenous drug use or sexual activity with multiple partners). The donor's travel history is screened to minimize risk of transmitting infections, such as malaria. The donated blood is tested for signs of infectious diseases, such as HIV and hepatitis. The blood is then tested to be sure it is compatible with you in order to minimize the chance of a transfusion reaction. If you or a relative donates blood, this is often done in anticipation of surgery and is not appropriate for emergency situations. It takes many days to process the donated blood. RISKS AND COMPLICATIONS Although transfusion therapy is very safe and saves many lives, the main dangers of transfusion include:   Getting an infectious disease.  Developing a transfusion reaction. This is an allergic reaction to something in the blood you were given. Every precaution is taken to prevent this. The decision to have a blood transfusion has been considered carefully by your caregiver before blood is given. Blood is not given unless the benefits outweigh  the risks. AFTER THE TRANSFUSION  Right after receiving a blood transfusion, you will usually feel much better and more energetic. This is especially true if your red blood cells have gotten low  (anemic). The transfusion raises the level of the red blood cells which carry oxygen, and this usually causes an energy increase.  The nurse administering the transfusion will monitor you carefully for complications. HOME CARE INSTRUCTIONS  No special instructions are needed after a transfusion. You may find your energy is better. Speak with your caregiver about any limitations on activity for underlying diseases you may have. SEEK MEDICAL CARE IF:   Your condition is not improving after your transfusion.  You develop redness or irritation at the intravenous (IV) site. SEEK IMMEDIATE MEDICAL CARE IF:  Any of the following symptoms occur over the next 12 hours:  Shaking chills.  You have a temperature by mouth above 102 F (38.9 C), not controlled by medicine.  Chest, back, or muscle pain.  People around you feel you are not acting correctly or are confused.  Shortness of breath or difficulty breathing.  Dizziness and fainting.  You get a rash or develop hives.  You have a decrease in urine output.  Your urine turns a dark color or changes to pink, red, or brown. Any of the following symptoms occur over the next 10 days:  You have a temperature by mouth above 102 F (38.9 C), not controlled by medicine.  Shortness of breath.  Weakness after normal activity.  The white part of the eye turns yellow (jaundice).  You have a decrease in the amount of urine or are urinating less often.  Your urine turns a dark color or changes to pink, red, or brown. Document Released: 02/10/2000 Document Revised: 05/07/2011 Document Reviewed: 09/29/2007 [facility] Patient Information 2014 [place], [place].  _______________________________________________________________________

## 2014-05-12 ENCOUNTER — Encounter (HOSPITAL_COMMUNITY): Payer: Self-pay

## 2014-05-12 ENCOUNTER — Encounter (HOSPITAL_COMMUNITY)
Admission: RE | Admit: 2014-05-12 | Discharge: 2014-05-12 | Disposition: A | Discharge status: Home / Self Care | Payer: Managed Care, Other (non HMO) | Source: Ambulatory Visit | Attending: Urology | Admitting: Urology

## 2014-05-12 DIAGNOSIS — Z01812 Encounter for preprocedural laboratory examination: Secondary | ICD-10-CM | POA: Insufficient documentation

## 2014-05-12 DIAGNOSIS — N289 Disorder of kidney and ureter, unspecified: Secondary | ICD-10-CM | POA: Insufficient documentation

## 2014-05-12 DIAGNOSIS — Z01810 Encounter for preprocedural cardiovascular examination: Secondary | ICD-10-CM | POA: Insufficient documentation

## 2014-05-12 HISTORY — DX: Major depressive disorder, single episode, unspecified: F32.9

## 2014-05-12 HISTORY — DX: Depression, unspecified: F32.A

## 2014-05-12 LAB — BASIC METABOLIC PANEL
Anion gap: 10 (ref 5–15)
BUN: 17 mg/dL (ref 6–23)
CHLORIDE: 104 mmol/L (ref 96–112)
CO2: 29 mmol/L (ref 19–32)
Calcium: 9.4 mg/dL (ref 8.4–10.5)
Creatinine, Ser: 1.06 mg/dL (ref 0.50–1.10)
GFR calc Af Amer: 65 mL/min — ABNORMAL LOW (ref >90)
GFR calc non Af Amer: 56 mL/min — ABNORMAL LOW (ref >90)
Glucose, Bld: 169 mg/dL — ABNORMAL HIGH (ref 70–99)
POTASSIUM: 3.2 mmol/L — AB (ref 3.5–5.1)
Sodium: 143 mmol/L (ref 135–145)

## 2014-05-12 LAB — CBC
HCT: 36.5 % (ref 36.0–46.0)
HEMOGLOBIN: 11.9 g/dL — AB (ref 12.0–15.0)
MCH: 29.5 pg (ref 26.0–34.0)
MCHC: 32.6 g/dL (ref 30.0–36.0)
MCV: 90.3 fL (ref 78.0–100.0)
Platelets: 156 10*3/uL (ref 150–400)
RBC: 4.04 MIL/uL (ref 3.87–5.11)
RDW: 14.5 % (ref 11.5–15.5)
WBC: 6.8 10*3/uL (ref 4.0–10.5)

## 2014-05-12 NOTE — Progress Notes (Signed)
LOV with Pulmonary - 07/22/2013 in EPIC.

## 2014-05-13 NOTE — Progress Notes (Signed)
BMP results faxed via EPIC to Dr Tresa Moore.   Final EKG done 05/12/14 in EPIC.

## 2014-05-19 ENCOUNTER — Inpatient Hospital Stay (HOSPITAL_COMMUNITY): Payer: Managed Care, Other (non HMO) | Admitting: Anesthesiology

## 2014-05-19 ENCOUNTER — Encounter (HOSPITAL_COMMUNITY): Payer: Self-pay | Admitting: Anesthesiology

## 2014-05-19 ENCOUNTER — Encounter (HOSPITAL_COMMUNITY)
Admission: RE | Disposition: A | Discharge status: Home / Self Care | Payer: Self-pay | Source: Ambulatory Visit | Attending: Urology

## 2014-05-19 ENCOUNTER — Inpatient Hospital Stay (HOSPITAL_COMMUNITY)
Admission: RE | Admit: 2014-05-19 | Discharge: 2014-05-22 | DRG: 657 | Disposition: A | Discharge status: Home / Self Care | Payer: Managed Care, Other (non HMO) | Source: Ambulatory Visit | Attending: Urology | Admitting: Urology

## 2014-05-19 DIAGNOSIS — Z01812 Encounter for preprocedural laboratory examination: Secondary | ICD-10-CM | POA: Diagnosis not present

## 2014-05-19 DIAGNOSIS — Z6836 Body mass index (BMI) 36.0-36.9, adult: Secondary | ICD-10-CM

## 2014-05-19 DIAGNOSIS — I10 Essential (primary) hypertension: Secondary | ICD-10-CM | POA: Diagnosis present

## 2014-05-19 DIAGNOSIS — E669 Obesity, unspecified: Secondary | ICD-10-CM | POA: Diagnosis present

## 2014-05-19 DIAGNOSIS — R509 Fever, unspecified: Secondary | ICD-10-CM

## 2014-05-19 DIAGNOSIS — J9811 Atelectasis: Secondary | ICD-10-CM | POA: Diagnosis not present

## 2014-05-19 DIAGNOSIS — J45909 Unspecified asthma, uncomplicated: Secondary | ICD-10-CM | POA: Diagnosis present

## 2014-05-19 DIAGNOSIS — C641 Malignant neoplasm of right kidney, except renal pelvis: Secondary | ICD-10-CM | POA: Diagnosis present

## 2014-05-19 DIAGNOSIS — Z800 Family history of malignant neoplasm of digestive organs: Secondary | ICD-10-CM

## 2014-05-19 DIAGNOSIS — Z87442 Personal history of urinary calculi: Secondary | ICD-10-CM

## 2014-05-19 DIAGNOSIS — K219 Gastro-esophageal reflux disease without esophagitis: Secondary | ICD-10-CM | POA: Diagnosis present

## 2014-05-19 DIAGNOSIS — E119 Type 2 diabetes mellitus without complications: Secondary | ICD-10-CM | POA: Diagnosis present

## 2014-05-19 DIAGNOSIS — G4733 Obstructive sleep apnea (adult) (pediatric): Secondary | ICD-10-CM | POA: Diagnosis present

## 2014-05-19 DIAGNOSIS — Z833 Family history of diabetes mellitus: Secondary | ICD-10-CM

## 2014-05-19 DIAGNOSIS — Z87891 Personal history of nicotine dependence: Secondary | ICD-10-CM

## 2014-05-19 DIAGNOSIS — N2889 Other specified disorders of kidney and ureter: Secondary | ICD-10-CM | POA: Diagnosis present

## 2014-05-19 HISTORY — PX: ROBOTIC ASSITED PARTIAL NEPHRECTOMY: SHX6087

## 2014-05-19 LAB — GLUCOSE, CAPILLARY
GLUCOSE-CAPILLARY: 130 mg/dL — AB (ref 70–99)
Glucose-Capillary: 139 mg/dL — ABNORMAL HIGH (ref 70–99)
Glucose-Capillary: 109 mg/dL — ABNORMAL HIGH (ref 70–99)

## 2014-05-19 LAB — ABO/RH: ABO/RH(D): O POS

## 2014-05-19 LAB — TYPE AND SCREEN
ABO/RH(D): O POS
Antibody Screen: NEGATIVE

## 2014-05-19 LAB — POTASSIUM: POTASSIUM: 3.1 mmol/L — AB (ref 3.5–5.1)

## 2014-05-19 LAB — HEMOGLOBIN AND HEMATOCRIT, BLOOD
HCT: 34.7 % — ABNORMAL LOW (ref 36.0–46.0)
Hemoglobin: 11.6 g/dL — ABNORMAL LOW (ref 12.0–15.0)

## 2014-05-19 SURGERY — ROBOTIC ASSITED PARTIAL NEPHRECTOMY
Anesthesia: General | Laterality: Right

## 2014-05-19 MED ORDER — PANTOPRAZOLE SODIUM 40 MG PO TBEC
80.0000 mg | DELAYED_RELEASE_TABLET | Freq: Every day | ORAL | Status: DC
  Administered 2014-05-19 – 2014-05-21 (×3): 80 mg via ORAL
  Filled 2014-05-19 (×4): qty 2

## 2014-05-19 MED ORDER — FENTANYL CITRATE 0.05 MG/ML IJ SOLN
INTRAMUSCULAR | Status: DC | PRN
  Administered 2014-05-19 (×2): 25 ug via INTRAVENOUS
  Administered 2014-05-19 (×2): 50 ug via INTRAVENOUS
  Administered 2014-05-19: 100 ug via INTRAVENOUS

## 2014-05-19 MED ORDER — SODIUM CHLORIDE 0.45 % IV SOLN
INTRAVENOUS | Status: DC
  Administered 2014-05-19 (×2): via INTRAVENOUS

## 2014-05-19 MED ORDER — MANNITOL 25 % IV SOLN
25.0000 g | Freq: Once | INTRAVENOUS | Status: AC
  Administered 2014-05-19 (×2): 12.5 g via INTRAVENOUS
  Filled 2014-05-19: qty 100

## 2014-05-19 MED ORDER — ACETAMINOPHEN 500 MG PO TABS
1000.0000 mg | ORAL_TABLET | Freq: Four times a day (QID) | ORAL | Status: AC
  Administered 2014-05-19 – 2014-05-20 (×4): 1000 mg via ORAL
  Filled 2014-05-19 (×4): qty 2

## 2014-05-19 MED ORDER — FENTANYL CITRATE 0.05 MG/ML IJ SOLN
INTRAMUSCULAR | Status: AC
Start: 2014-05-19 — End: 2014-05-19
  Filled 2014-05-19: qty 5

## 2014-05-19 MED ORDER — PROPOFOL 10 MG/ML IV BOLUS
INTRAVENOUS | Status: AC
  Filled 2014-05-19: qty 20

## 2014-05-19 MED ORDER — SODIUM CHLORIDE 0.9 % IJ SOLN
INTRAMUSCULAR | Status: AC
  Filled 2014-05-19: qty 10

## 2014-05-19 MED ORDER — LOSARTAN POTASSIUM 50 MG PO TABS
100.0000 mg | ORAL_TABLET | Freq: Every day | ORAL | Status: DC
  Administered 2014-05-19 – 2014-05-21 (×3): 100 mg via ORAL
  Filled 2014-05-19 (×4): qty 2

## 2014-05-19 MED ORDER — INSULIN ASPART 100 UNIT/ML ~~LOC~~ SOLN
0.0000 [IU] | Freq: Three times a day (TID) | SUBCUTANEOUS | Status: DC
  Administered 2014-05-19 – 2014-05-20 (×4): 2 [IU] via SUBCUTANEOUS
  Administered 2014-05-21: 3 [IU] via SUBCUTANEOUS
  Administered 2014-05-21 – 2014-05-22 (×2): 2 [IU] via SUBCUTANEOUS

## 2014-05-19 MED ORDER — LACTATED RINGERS IV SOLN
INTRAVENOUS | Status: DC | PRN
  Administered 2014-05-19 (×2): via INTRAVENOUS

## 2014-05-19 MED ORDER — NEOSTIGMINE METHYLSULFATE 10 MG/10ML IV SOLN
INTRAVENOUS | Status: AC
  Filled 2014-05-19: qty 1

## 2014-05-19 MED ORDER — PROMETHAZINE HCL 25 MG/ML IJ SOLN: 6.2500 mg | INTRAMUSCULAR | Status: DC | PRN

## 2014-05-19 MED ORDER — ALBUTEROL SULFATE (2.5 MG/3ML) 0.083% IN NEBU
2.5000 mg | INHALATION_SOLUTION | RESPIRATORY_TRACT | Status: DC | PRN
  Administered 2014-05-20: 2.5 mg via RESPIRATORY_TRACT
  Filled 2014-05-19: qty 3

## 2014-05-19 MED ORDER — CEFAZOLIN SODIUM-DEXTROSE 2-3 GM-% IV SOLR
INTRAVENOUS | Status: AC
Start: 2014-05-19 — End: 2014-05-19
  Filled 2014-05-19: qty 50

## 2014-05-19 MED ORDER — PHENYLEPHRINE HCL 10 MG/ML IJ SOLN
INTRAMUSCULAR | Status: DC | PRN
  Administered 2014-05-19 (×4): 80 ug via INTRAVENOUS
  Administered 2014-05-19: 40 ug via INTRAVENOUS

## 2014-05-19 MED ORDER — NEOSTIGMINE METHYLSULFATE 10 MG/10ML IV SOLN
INTRAVENOUS | Status: DC | PRN
  Administered 2014-05-19: 4 mg via INTRAVENOUS

## 2014-05-19 MED ORDER — HYDROMORPHONE HCL 1 MG/ML IJ SOLN
0.2500 mg | INTRAMUSCULAR | Status: DC | PRN
  Administered 2014-05-19 (×2): 0.5 mg via INTRAVENOUS

## 2014-05-19 MED ORDER — SODIUM CHLORIDE 0.9 % IJ SOLN
INTRAMUSCULAR | Status: AC
  Filled 2014-05-19: qty 20

## 2014-05-19 MED ORDER — MEPERIDINE HCL 50 MG/ML IJ SOLN: 6.2500 mg | INTRAMUSCULAR | Status: DC | PRN

## 2014-05-19 MED ORDER — ONDANSETRON HCL 4 MG PO TABS
4.0000 mg | ORAL_TABLET | Freq: Four times a day (QID) | ORAL | Status: DC | PRN
  Administered 2014-05-19 – 2014-05-20 (×2): 4 mg via ORAL
  Filled 2014-05-19 (×2): qty 1

## 2014-05-19 MED ORDER — PROPOFOL 10 MG/ML IV BOLUS
INTRAVENOUS | Status: DC | PRN
  Administered 2014-05-19: 180 mg via INTRAVENOUS

## 2014-05-19 MED ORDER — LEVOTHYROXINE SODIUM 125 MCG PO TABS
125.0000 ug | ORAL_TABLET | Freq: Every day | ORAL | Status: DC
  Administered 2014-05-20 – 2014-05-22 (×3): 125 ug via ORAL
  Filled 2014-05-19 (×3): qty 1

## 2014-05-19 MED ORDER — LIDOCAINE HCL (CARDIAC) 20 MG/ML IV SOLN
INTRAVENOUS | Status: DC | PRN
  Administered 2014-05-19: 50 mg via INTRAVENOUS

## 2014-05-19 MED ORDER — ROCURONIUM BROMIDE 100 MG/10ML IV SOLN
INTRAVENOUS | Status: DC | PRN
  Administered 2014-05-19 (×2): 10 mg via INTRAVENOUS
  Administered 2014-05-19: 50 mg via INTRAVENOUS
  Administered 2014-05-19 (×2): 10 mg via INTRAVENOUS

## 2014-05-19 MED ORDER — HYDROMORPHONE HCL 1 MG/ML IJ SOLN
INTRAMUSCULAR | Status: DC | PRN
  Administered 2014-05-19: 0.5 mg via INTRAVENOUS

## 2014-05-19 MED ORDER — EPHEDRINE SULFATE 50 MG/ML IJ SOLN
INTRAMUSCULAR | Status: AC
  Filled 2014-05-19: qty 1

## 2014-05-19 MED ORDER — ROCURONIUM BROMIDE 100 MG/10ML IV SOLN
INTRAVENOUS | Status: AC
Start: 2014-05-19 — End: 2014-05-19
  Filled 2014-05-19: qty 1

## 2014-05-19 MED ORDER — CETYLPYRIDINIUM CHLORIDE 0.05 % MT LIQD
7.0000 mL | Freq: Two times a day (BID) | OROMUCOSAL | Status: DC
  Administered 2014-05-19 – 2014-05-20 (×3): 7 mL via OROMUCOSAL

## 2014-05-19 MED ORDER — BUPIVACAINE LIPOSOME 1.3 % IJ SUSP
20.0000 mL | Freq: Once | INTRAMUSCULAR | Status: AC
  Administered 2014-05-19: 20 mL
  Filled 2014-05-19: qty 20

## 2014-05-19 MED ORDER — ONDANSETRON HCL 4 MG/2ML IJ SOLN
INTRAMUSCULAR | Status: AC
  Filled 2014-05-19: qty 2

## 2014-05-19 MED ORDER — LOSARTAN POTASSIUM-HCTZ 100-12.5 MG PO TABS: 1.0000 | ORAL_TABLET | Freq: Every day | ORAL | Status: DC

## 2014-05-19 MED ORDER — METOPROLOL SUCCINATE ER 25 MG PO TB24
25.0000 mg | ORAL_TABLET | Freq: Every day | ORAL | Status: DC
  Administered 2014-05-20 – 2014-05-21 (×2): 25 mg via ORAL
  Filled 2014-05-19 (×2): qty 1

## 2014-05-19 MED ORDER — OXYCODONE HCL 5 MG PO TABS
10.0000 mg | ORAL_TABLET | Freq: Four times a day (QID) | ORAL | Status: DC | PRN
  Administered 2014-05-19 – 2014-05-20 (×2): 10 mg via ORAL
  Filled 2014-05-19 (×2): qty 2

## 2014-05-19 MED ORDER — INDOCYANINE GREEN 25 MG IV SOLR
INTRAVENOUS | Status: DC | PRN
  Administered 2014-05-19: 5 mg via INTRAVENOUS

## 2014-05-19 MED ORDER — FENTANYL CITRATE 0.05 MG/ML IJ SOLN
INTRAMUSCULAR | Status: AC
  Filled 2014-05-19: qty 2

## 2014-05-19 MED ORDER — GABAPENTIN 300 MG PO CAPS
300.0000 mg | ORAL_CAPSULE | Freq: Every day | ORAL | Status: DC
  Administered 2014-05-19 – 2014-05-21 (×3): 300 mg via ORAL
  Filled 2014-05-19 (×3): qty 1

## 2014-05-19 MED ORDER — HYDROMORPHONE HCL 1 MG/ML IJ SOLN
0.5000 mg | INTRAMUSCULAR | Status: DC | PRN
  Administered 2014-05-19 – 2014-05-21 (×8): 1 mg via INTRAVENOUS
  Filled 2014-05-19 (×9): qty 1

## 2014-05-19 MED ORDER — LACTATED RINGERS IR SOLN
Status: DC | PRN
  Administered 2014-05-19: 1000 mL

## 2014-05-19 MED ORDER — MOMETASONE FURO-FORMOTEROL FUM 100-5 MCG/ACT IN AERO
2.0000 | INHALATION_SPRAY | Freq: Two times a day (BID) | RESPIRATORY_TRACT | Status: DC
  Administered 2014-05-19 – 2014-05-22 (×6): 2 via RESPIRATORY_TRACT
  Filled 2014-05-19: qty 8.8

## 2014-05-19 MED ORDER — METFORMIN HCL ER 500 MG PO TB24
1000.0000 mg | ORAL_TABLET | Freq: Two times a day (BID) | ORAL | Status: DC
  Administered 2014-05-19: 1000 mg via ORAL
  Filled 2014-05-19 (×3): qty 2

## 2014-05-19 MED ORDER — ONDANSETRON HCL 4 MG/2ML IJ SOLN
INTRAMUSCULAR | Status: DC | PRN
  Administered 2014-05-19 (×2): 4 mg via INTRAVENOUS

## 2014-05-19 MED ORDER — HYDROCHLOROTHIAZIDE 12.5 MG PO CAPS
12.5000 mg | ORAL_CAPSULE | Freq: Every day | ORAL | Status: DC
  Administered 2014-05-19 – 2014-05-21 (×3): 12.5 mg via ORAL
  Filled 2014-05-19 (×4): qty 1

## 2014-05-19 MED ORDER — HYDROMORPHONE HCL 1 MG/ML IJ SOLN
INTRAMUSCULAR | Status: AC
  Filled 2014-05-19: qty 1

## 2014-05-19 MED ORDER — LACTATED RINGERS IV SOLN: INTRAVENOUS | Status: DC

## 2014-05-19 MED ORDER — HYDROMORPHONE HCL 2 MG/ML IJ SOLN
INTRAMUSCULAR | Status: AC
  Filled 2014-05-19: qty 1

## 2014-05-19 MED ORDER — CEFAZOLIN SODIUM-DEXTROSE 2-3 GM-% IV SOLR
2.0000 g | INTRAVENOUS | Status: AC
  Administered 2014-05-19: 2 g via INTRAVENOUS

## 2014-05-19 MED ORDER — ATORVASTATIN CALCIUM 40 MG PO TABS
40.0000 mg | ORAL_TABLET | Freq: Every day | ORAL | Status: DC
  Administered 2014-05-19 – 2014-05-21 (×3): 40 mg via ORAL
  Filled 2014-05-19 (×4): qty 1

## 2014-05-19 MED ORDER — MIDAZOLAM HCL 5 MG/5ML IJ SOLN
INTRAMUSCULAR | Status: DC | PRN
  Administered 2014-05-19: 2 mg via INTRAVENOUS

## 2014-05-19 MED ORDER — EPHEDRINE SULFATE 50 MG/ML IJ SOLN
INTRAMUSCULAR | Status: DC | PRN
  Administered 2014-05-19 (×2): 10 mg via INTRAVENOUS

## 2014-05-19 MED ORDER — CHLORHEXIDINE GLUCONATE 0.12 % MT SOLN
15.0000 mL | Freq: Two times a day (BID) | OROMUCOSAL | Status: DC
  Administered 2014-05-19 – 2014-05-22 (×5): 15 mL via OROMUCOSAL
  Filled 2014-05-19 (×6): qty 15

## 2014-05-19 MED ORDER — STERILE WATER FOR IRRIGATION IR SOLN
Status: DC | PRN
  Administered 2014-05-19: 3000 mL

## 2014-05-19 MED ORDER — PIOGLITAZONE HCL 30 MG PO TABS
30.0000 mg | ORAL_TABLET | Freq: Every day | ORAL | Status: DC
  Administered 2014-05-19 – 2014-05-21 (×3): 30 mg via ORAL
  Filled 2014-05-19 (×4): qty 1

## 2014-05-19 MED ORDER — GLYCOPYRROLATE 0.2 MG/ML IJ SOLN
INTRAMUSCULAR | Status: DC | PRN
  Administered 2014-05-19: 0.6 mg via INTRAVENOUS

## 2014-05-19 MED ORDER — MIDAZOLAM HCL 2 MG/2ML IJ SOLN
INTRAMUSCULAR | Status: AC
  Filled 2014-05-19: qty 2

## 2014-05-19 MED ORDER — FENTANYL CITRATE 0.05 MG/ML IJ SOLN
25.0000 ug | INTRAMUSCULAR | Status: DC | PRN
  Administered 2014-05-19 (×3): 50 ug via INTRAVENOUS

## 2014-05-19 MED ORDER — GLYCOPYRROLATE 0.2 MG/ML IJ SOLN
INTRAMUSCULAR | Status: AC
  Filled 2014-05-19: qty 2

## 2014-05-19 MED ORDER — SUCCINYLCHOLINE CHLORIDE 20 MG/ML IJ SOLN
INTRAMUSCULAR | Status: DC | PRN
  Administered 2014-05-19: 140 mg via INTRAVENOUS

## 2014-05-19 MED ORDER — CITALOPRAM HYDROBROMIDE 40 MG PO TABS
40.0000 mg | ORAL_TABLET | Freq: Every day | ORAL | Status: DC
  Administered 2014-05-20 – 2014-05-21 (×2): 40 mg via ORAL
  Filled 2014-05-19 (×3): qty 1

## 2014-05-19 SURGICAL SUPPLY — 67 items
APL ESCP 34 STRL LF DISP (HEMOSTASIS) ×1
APPLICATOR SURGIFLO ENDO (HEMOSTASIS) ×2 IMPLANT
BAG SPEC RTRVL LRG 6X4 10 (ENDOMECHANICALS) ×1
CABLE HIGH FREQUENCY MONO STRZ (ELECTRODE) ×2 IMPLANT
CHLORAPREP W/TINT 26ML (MISCELLANEOUS) ×2 IMPLANT
CLIP LIGATING HEM O LOK PURPLE (MISCELLANEOUS) ×3 IMPLANT
CLIP LIGATING HEMO LOK XL GOLD (MISCELLANEOUS) ×1 IMPLANT
CLIP LIGATING HEMO O LOK GREEN (MISCELLANEOUS) ×4 IMPLANT
CLIP SUT LAPRA TY ABSORB (SUTURE) ×1 IMPLANT
CORDS BIPOLAR (ELECTRODE) ×2 IMPLANT
COVER SURGICAL LIGHT HANDLE (MISCELLANEOUS) ×2 IMPLANT
COVER TIP SHEARS 8 DVNC (MISCELLANEOUS) ×1 IMPLANT
COVER TIP SHEARS 8MM DA VINCI (MISCELLANEOUS) ×1
CUTTER ECHEON FLEX ENDO 45 340 (ENDOMECHANICALS) IMPLANT
DECANTER SPIKE VIAL GLASS SM (MISCELLANEOUS) ×1 IMPLANT
DRAIN CHANNEL 15F RND FF 3/16 (WOUND CARE) ×2 IMPLANT
DRAPE INCISE IOBAN 66X45 STRL (DRAPES) ×2 IMPLANT
DRAPE LAPAROSCOPIC ABDOMINAL (DRAPES) ×2 IMPLANT
DRAPE SHEET LG 3/4 BI-LAMINATE (DRAPES) ×2 IMPLANT
DRAPE TABLE BACK 44X90 PK DISP (DRAPES) ×2 IMPLANT
DRAPE WARM FLUID 44X44 (DRAPE) ×2 IMPLANT
ELECT REM PT RETURN 9FT ADLT (ELECTROSURGICAL) ×2
ELECTRODE REM PT RTRN 9FT ADLT (ELECTROSURGICAL) ×1 IMPLANT
EVACUATOR SILICONE 100CC (DRAIN) ×2 IMPLANT
GLOVE BIO SURGEON STRL SZ 6.5 (GLOVE) ×2 IMPLANT
GLOVE BIOGEL M STRL SZ7.5 (GLOVE) ×4 IMPLANT
GOWN STRL REUS W/TWL LRG LVL3 (GOWN DISPOSABLE) ×4 IMPLANT
HEMOSTAT SURGICEL 2X14 (HEMOSTASIS) ×1 IMPLANT
HEMOSTAT SURGICEL 4X8 (HEMOSTASIS) ×1 IMPLANT
KIT ACCESSORY DA VINCI DISP (KITS) ×1
KIT ACCESSORY DVNC DISP (KITS) ×1 IMPLANT
KIT BASIN OR (CUSTOM PROCEDURE TRAY) ×2 IMPLANT
LIQUID BAND (GAUZE/BANDAGES/DRESSINGS) ×2 IMPLANT
LOOP VESSEL MAXI BLUE (MISCELLANEOUS) ×2 IMPLANT
NDL INSUFFLATION 14GA 120MM (NEEDLE) ×1 IMPLANT
NEEDLE INSUFFLATION 14GA 120MM (NEEDLE) ×2 IMPLANT
NS IRRIG 1000ML POUR BTL (IV SOLUTION) ×1 IMPLANT
PEN SKIN MARKING BROAD (MISCELLANEOUS) ×2 IMPLANT
PENCIL BUTTON HOLSTER BLD 10FT (ELECTRODE) ×2 IMPLANT
POSITIONER SURGICAL ARM (MISCELLANEOUS) ×4 IMPLANT
POUCH SPECIMEN RETRIEVAL 10MM (ENDOMECHANICALS) ×2 IMPLANT
RELOAD WH ECHELON 45 (STAPLE) IMPLANT
SET TUBE IRRIG SUCTION NO TIP (IRRIGATION / IRRIGATOR) ×1 IMPLANT
SOLUTION ELECTROLUBE (MISCELLANEOUS) ×2 IMPLANT
SPONGE LAP 4X18 X RAY DECT (DISPOSABLE) ×2 IMPLANT
SURGIFLO W/THROMBIN 8M KIT (HEMOSTASIS) ×2 IMPLANT
SUT ETHILON 3 0 PS 1 (SUTURE) ×5 IMPLANT
SUT MNCRL AB 4-0 PS2 18 (SUTURE) ×4 IMPLANT
SUT PDS AB 1 CT1 27 (SUTURE) ×4 IMPLANT
SUT V-LOC BARB 180 2/0GR6 GS22 (SUTURE) ×2
SUT VIC AB 0 CT1 36 (SUTURE) ×4 IMPLANT
SUT VIC AB 2-0 SH 18 (SUTURE) ×1 IMPLANT
SUT VIC AB 2-0 SH 27 (SUTURE) ×2
SUT VIC AB 2-0 SH 27X BRD (SUTURE) IMPLANT
SUT VIC AB 2-0 UR5 27 (SUTURE) ×1 IMPLANT
SUT VLOC BARB 180 ABS3/0GR12 (SUTURE) ×6
SUTURE V-LC BRB 180 2/0GR6GS22 (SUTURE) ×1 IMPLANT
SUTURE VLOC BRB 180 ABS3/0GR12 (SUTURE) ×1 IMPLANT
TOWEL OR 17X26 10 PK STRL BLUE (TOWEL DISPOSABLE) ×4 IMPLANT
TOWEL OR NON WOVEN STRL DISP B (DISPOSABLE) ×2 IMPLANT
TRAY FOLEY CATH 14FRSI W/METER (CATHETERS) ×3 IMPLANT
TRAY LAPAROSCOPIC (CUSTOM PROCEDURE TRAY) ×2 IMPLANT
TROCAR 12M 150ML BLUNT (TROCAR) ×2 IMPLANT
TROCAR BLADELESS OPT 5 100 (ENDOMECHANICALS) ×2 IMPLANT
TROCAR UNIVERSAL OPT 12M 100M (ENDOMECHANICALS) ×2 IMPLANT
TROCAR XCEL 12X100 BLDLESS (ENDOMECHANICALS) ×2 IMPLANT
WATER STERILE IRR 1500ML POUR (IV SOLUTION) ×2 IMPLANT

## 2014-05-19 NOTE — Anesthesia Postprocedure Evaluation (Signed)
  Anesthesia Post-op Note  Patient: Savannah [name]  Procedure(s) Performed: Procedure(s): ROBOTIC ASSITED PARTIAL NEPHRECTOMY (Right)  Patient Location: PACU  Anesthesia Type:General  Level of Consciousness: awake, alert  and oriented  Airway and Oxygen Therapy: Patient Spontanous Breathing and Patient connected to nasal cannula oxygen  Post-op Pain: mild  Post-op Assessment: Post-op Vital signs reviewed, Patient's Cardiovascular Status Stable, Respiratory Function Stable, Patent Airway, No signs of Nausea or vomiting and Pain level controlled  Post-op Vital Signs: Reviewed and stable  Last Vitals:  Filed Vitals:   05/19/14 1415  BP: 134/58  Pulse: 96  Temp: 36.8 C  Resp: 16    Complications: No apparent anesthesia complications

## 2014-05-19 NOTE — Anesthesia Procedure Notes (Signed)
Procedure Name: Intubation Date/Time: 05/19/2014 8:32 AM Performed by: Sibel Khurana, Virgel Gess Pre-anesthesia Checklist: Patient identified, Emergency Drugs available, Suction available, Patient being monitored and Timeout performed Patient Re-evaluated:Patient Re-evaluated prior to inductionOxygen Delivery Method: Circle system utilized Preoxygenation: Pre-oxygenation with 100% oxygen Intubation Type: IV induction Ventilation: Mask ventilation without difficulty Laryngoscope Size: Mac and 4 Grade View: Grade II Tube type: Oral Tube size: 7.5 mm Number of attempts: 1 Airway Equipment and Method: Stylet Placement Confirmation: ETT inserted through vocal cords under direct vision,  positive ETCO2,  CO2 detector and breath sounds checked- equal and bilateral Secured at: 22 cm Tube secured with: Tape Dental Injury: Teeth and Oropharynx as per pre-operative assessment

## 2014-05-19 NOTE — Progress Notes (Signed)
Day of Surgery Subjective: Doing well, pain controlled with PO meds. No nausea/vomiting. No issues or complaints.  Objective: Vital signs in last 24 hours: Temp:  [97.8 F (36.6 C)-98.6 F (37 C)] 98.2 F (36.8 C) (03/23 1415) Pulse Rate:  [75-100] 96 (03/23 1415) Resp:  [16-20] 16 (03/23 1415) BP: (125-155)/(50-67) 134/58 mmHg (03/23 1415) SpO2:  [93 %-100 %] 93 % (03/23 1415) Weight:  [110.224 kg (243 lb)] 110.224 kg (243 lb) (03/23 0645)  Intake/Output this shift: Total I/O In: 2250 [I.V.:2250] Out: 410 [Urine:120; Drains:90; Blood:200]  Physical Exam:  General: Alert and oriented CV: RRR Lungs: Clear Abdomen: Soft, ND Incisions: c/d/i JP: [place] Urine: clear Ext: NT, No erythema  Lab Results:  Recent Labs  05/19/14 1310  HGB 11.6*  HCT 34.7*  Stable from pre-op  BMET  Recent Labs  05/19/14 0703  K 3.1*   Studies/Results: No results found.  Assessment/Plan: POD#0 from right robotic partial nephrectomy. Doing well. Bed rest until tomorrow morning. Otherwise routine post-op care.    LOS: 0 days   Milon Score 05/19/2014, 4:34 PM

## 2014-05-19 NOTE — Brief Op Note (Signed)
05/19/2014  12:29 PM  PATIENT:  Savannah [name]  59 y.o. female  PRE-OPERATIVE DIAGNOSIS:  RIGHT RENAL MASS  POST-OPERATIVE DIAGNOSIS:  RIGHT RENAL MASS  PROCEDURE:  Procedure(s): ROBOTIC ASSITED PARTIAL NEPHRECTOMY (Right)  SURGEON:  Surgeon(s) and Role:    * Alexis Frock, MD - Primary  PHYSICIAN ASSISTANT:   ASSISTANTS: 1 - Clemetine Marker PA 2 - Amaryllis Dyke MD   ANESTHESIA:   general  EBL:  Total I/O In: 2000 [I.V.:2000] Out: 320 [Urine:120; Blood:200]  BLOOD ADMINISTERED:none  DRAINS: 1 - foley to gravity; 2- JP to bulb   LOCAL MEDICATIONS USED:  MARCAINE     SPECIMEN:  Source of Specimen:  Rt partial nephrectomy  DISPOSITION OF SPECIMEN:  PATHOLOGY  COUNTS:  YES  TOURNIQUET:  * No tourniquets in log *  DICTATION: .Other Dictation: Dictation Number [id number]  PLAN OF CARE: Admit to inpatient   PATIENT DISPOSITION:  PACU - hemodynamically stable.   Delay start of Pharmacological VTE agent (>24hrs) due to surgical blood loss or risk of bleeding: yes

## 2014-05-19 NOTE — Progress Notes (Signed)
Pt set-up on 10cm CPAP with nasal pillows. Pt uses her own equipment except for machine. Oxygen bleed-in at 2 lpm. Humidification added to CPAP. Pt tolerated fine. RT will continue to monitor.

## 2014-05-19 NOTE — Progress Notes (Signed)
Pt has a reddened dime size circular area noted on right lower abdominal area.  She states she was not aware it was there and doesn't remember how it would have happened.  No drainage noted and she states it is not painful.

## 2014-05-19 NOTE — H&P (Signed)
Savannah [name] is an 59 y.o. female.    Chief Complaint: Pre-Op Right Robotic Partial Nephrectomy  HPI:   1 - Right Renal Mass - 3cm enhancing Rt upper pole mass worrisome for renal cell carcinoma, about 60% endophytic, found by CT 03/2014 on eval hematuria. 3 artery ( 1 anterior lower pole, 1 post mid pole, 1 post upper pole) 1 early branching vein renovascular anatomy. No lymphadenopathy or ipsilateral adrenal lesions.  2 - Recurrent Nephrolithiasis - Several episodes small stone passage medically. Previously had equivocal work up for hyperparathyroidism and was on K-Cit for awhile but stopped due to GI upset. No on dietary modification with increased hydration and dietary citrate. CT 03/2014 with stable Rt sided mild of calcium appearing stone.   PMH sig for DM2, ortho surgery x severa, obesity. NO blood thinners. NO chest or abdominal surgery.. Her PCP is Dr. Loanne Drilling.   Today " Savannah [name] " is seen to proceed with robotic partial nephrectomy.   Past Medical History  Diagnosis Date  . Unspecified hypothyroidism 01/07/2007  . 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 HYPERCHOLESTEROLEMIA 04/18/2009  . HYPOKALEMIA 01/15/2007  . ANEMIA, IRON DEFICIENCY 12/06/2008  . HYPERTENSION 09/27/2006  . ALLERGIC RHINITIS CAUSE UNSPECIFIED 12/29/2007  . ASTHMA 09/27/2006  . FATTY LIVER DISEASE 03/19/2008  . URINARY CALCULUS 11/08/2009  . 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 BACK PAIN, LUMBAR 12/29/2009  . Palpitations 05/23/2009  . ASYMPTOMATIC POSTMENOPAUSAL STATUS 03/19/2008  . Chronic kidney disease   . OBSTRUCTIVE SLEEP APNEA 08/09/2009  . Sleep apnea     wears a c-pap- settings at 9   . Depression     Past Surgical History  Procedure Laterality Date  . Knee arthroscopy Left 1993  . Cardiac catheterization  2011  . Compound fracture Right     arm  . Right foot surgery       Family History  Problem Relation Age of Onset  . Colon cancer Mother 34  . Diabetes Mother   . Kidney disease Mother   . Heart attack  Father   . Heart disease Father   . Allergies Sister   . Asthma Sister   . Clotting disorder Grandchild   . Heart disease Paternal Grandmother   . Esophageal cancer Neg Hx   . Rectal cancer Neg Hx   . Stomach cancer Neg Hx    Social History:  reports that she quit smoking about 39 years ago. Her smoking use included Cigarettes. She has a .6 pack-year smoking history. She has never used smokeless tobacco. She reports that she does not drink alcohol or use illicit drugs.  Allergies:  Allergies  Allergen Reactions  . Erythromycin Nausea And Vomiting    REACTION: nausea/vomiting    No prescriptions prior to admission    No results found for this or any previous visit (from the past 48 hour(s)). No results found.  Review of Systems  Constitutional: Negative.   HENT: Negative.   Eyes: Negative.   Respiratory: Negative.   Cardiovascular: Negative.   Gastrointestinal: Negative.   Genitourinary: Negative.   Musculoskeletal: Negative.   Skin: Negative.   Neurological: Negative.   Endo/Heme/Allergies: Negative.   Psychiatric/Behavioral: Negative.     There were no vitals taken for this visit. Physical Exam  Constitutional: She appears well-developed.  HENT:  Head: Normocephalic.  Eyes: Pupils are equal, round, and reactive to light.  Neck: Normal range of motion.  Cardiovascular: Normal rate.   Respiratory: Effort normal.  GI: Soft.  Genitourinary:  No CVAT  Musculoskeletal: Normal range of motion.  Neurological: She is alert.  Skin: Skin is warm.  Psychiatric: She has a normal mood and affect. Her behavior is normal. Judgment and thought content normal.     Assessment/Plan   1 - Right Renal Mass - certainly worrisome for localized renal malignancy. Does appear amenable to partial nephrecotmy, though somewhat challenging given endophytic nature and very complex renovascular anatomy. I would predict at least 20% chance of need for conversion to radical  nephrectomy.  We re discussed  the role of partial nephrectomy with the overall goals being a balance of trying to achieve complete surgical excision (negative margins) while minimizing loss of normally functioning kidney. We then rediscussed surgical approaches including robotic and open techniques with robotic associated with a shorter convalescence. I showed the patient on their abdomen the approximately 4-6 incision (trocar) sites as well as presumed extraction sites with robotic approach as well as possible open incision sites. We specifically readdressed that there may be need to alter operative plans according to intraopertive findings including conversion to open procedure or conversion to radical nephrectomy as well as need for adjunctive procedures such as ureteral stenting to promote correct renal healing. We rediscussed specific peri-operative risks including bleeding, infection, deep vein thrombosis, pulmonary embolism, compartment syndrome, neuropathy / neuropraxia, heart attack, stroke, death, as well as long-term risks such as non-cure / need for additional therapy and need for imaging and lab based post-op surveillance protocols. We rediscussed typical hospital course of approximately 2 day hospitalization, need for peri-operative drains / catheters, and typical post-hospital course with return to most non-strenuous activities by 2 weeks and ability to return to most jobs and more strenuous activity such as exercise by 6 weeks.   After this lengthy and detail discussion, including answering all of the patient's questions to their satisfaction, they have chosen to proceed as per above.   2 - Recurrent Nephrolithiasis - stable, non-obstructing stone burden, continue increased hydration and dietary citrate.    Savannah [name] 05/19/2014, 6:16 AM

## 2014-05-19 NOTE — Progress Notes (Signed)
Pt c/o  neck pain ; behind her neck; posterior; Dr. Royce Macadamia made aware; order rec'd and med given; Dr. Royce Macadamia in to check pt; pt states she has had neck pain previously

## 2014-05-19 NOTE — Transfer of Care (Signed)
Immediate Anesthesia Transfer of Care Note  Patient: Savannah [name]  Procedure(s) Performed: Procedure(s) (LRB): ROBOTIC ASSITED PARTIAL NEPHRECTOMY (Right)  Patient Location: PACU  Anesthesia Type: General  Level of Consciousness: sedated, patient cooperative and responds to stimulation  Airway & Oxygen Therapy: Patient Spontanous Breathing and Patient connected to face mask oxgen  Post-op Assessment: Report given to PACU RN and Post -op Vital signs reviewed and stable  Post vital signs: Reviewed and stable  Complications: No apparent anesthesia complications

## 2014-05-19 NOTE — Anesthesia Preprocedure Evaluation (Signed)
Anesthesia Evaluation  Patient identified by MRN, date of birth, ID band Patient awake    Reviewed: Allergy & Precautions, NPO status , Patient's Chart, lab work & pertinent test results, reviewed documented beta blocker date and time   Airway Mallampati: IV  TM Distance: >3 FB Neck ROM: Full    Dental no notable dental hx. (+) Teeth Intact   Pulmonary shortness of breath and with exertion, asthma , sleep apnea and Continuous Positive Airway Pressure Ventilation , former smoker,  breath sounds clear to auscultation  Pulmonary exam normal       Cardiovascular hypertension, Pt. on medications and Pt. on home beta blockers Rhythm:Regular Rate:Normal     Neuro/Psych PSYCHIATRIC DISORDERS Depression    GI/Hepatic GERD-  Medicated and Controlled,  Endo/Other  diabetes, Well Controlled, Type 2, Oral Hypoglycemic AgentsHypothyroidism Obesity  Renal/GU Renal diseaseRight renal mass  negative genitourinary   Musculoskeletal  (+) Arthritis -, Osteoarthritis,    Abdominal (+) + obese,   Peds  Hematology  (+) anemia ,   Anesthesia Other Findings   Reproductive/Obstetrics negative OB ROS                             Anesthesia Physical Anesthesia Plan  ASA: III  Anesthesia Plan: General   Post-op Pain Management:    Induction: Intravenous and Cricoid pressure planned  Airway Management Planned: Oral ETT  Additional Equipment:   Intra-op Plan:   Post-operative Plan: Extubation in OR  Informed Consent: I have reviewed the patients History and Physical, chart, labs and discussed the procedure including the risks, benefits and alternatives for the proposed anesthesia with the patient or authorized representative who has indicated his/her understanding and acceptance.   Dental advisory given  Plan Discussed with: CRNA, Anesthesiologist and Surgeon  Anesthesia Plan Comments:          Anesthesia Quick Evaluation

## 2014-05-19 NOTE — Discharge Instructions (Signed)

## 2014-05-20 ENCOUNTER — Inpatient Hospital Stay (HOSPITAL_COMMUNITY): Payer: Managed Care, Other (non HMO)

## 2014-05-20 ENCOUNTER — Encounter (HOSPITAL_COMMUNITY): Payer: Self-pay | Admitting: Urology

## 2014-05-20 LAB — GLUCOSE, CAPILLARY
GLUCOSE-CAPILLARY: 169 mg/dL — AB (ref 70–99)
Glucose-Capillary: 122 mg/dL — ABNORMAL HIGH (ref 70–99)
Glucose-Capillary: 145 mg/dL — ABNORMAL HIGH (ref 70–99)
Glucose-Capillary: 126 mg/dL — ABNORMAL HIGH (ref 70–99)

## 2014-05-20 LAB — BASIC METABOLIC PANEL
ANION GAP: 11 (ref 5–15)
BUN: 21 mg/dL (ref 6–23)
CALCIUM: 8.1 mg/dL — AB (ref 8.4–10.5)
CO2: 27 mmol/L (ref 19–32)
Chloride: 97 mmol/L (ref 96–112)
Creatinine, Ser: 1.99 mg/dL — ABNORMAL HIGH (ref 0.50–1.10)
GFR calc Af Amer: 30 mL/min — ABNORMAL LOW (ref >90)
GFR, EST NON AFRICAN AMERICAN: 26 mL/min — AB (ref >90)
Glucose, Bld: 139 mg/dL — ABNORMAL HIGH (ref 70–99)
Potassium: 3.9 mmol/L (ref 3.5–5.1)
SODIUM: 135 mmol/L (ref 135–145)

## 2014-05-20 LAB — CREATININE, FLUID (PLEURAL, PERITONEAL, JP DRAINAGE): Creat, Fluid: 2 mg/dL

## 2014-05-20 LAB — HEMOGLOBIN AND HEMATOCRIT, BLOOD
HCT: 31 % — ABNORMAL LOW (ref 36.0–46.0)
Hemoglobin: 10.2 g/dL — ABNORMAL LOW (ref 12.0–15.0)

## 2014-05-20 MED ORDER — SODIUM CHLORIDE 0.9 % IV SOLN
INTRAVENOUS | Status: DC
  Administered 2014-05-20 – 2014-05-21 (×3): via INTRAVENOUS

## 2014-05-20 MED ORDER — OXYCODONE HCL 5 MG PO TABS
15.0000 mg | ORAL_TABLET | ORAL | Status: DC | PRN
  Administered 2014-05-20 – 2014-05-21 (×3): 15 mg via ORAL
  Filled 2014-05-20 (×3): qty 3

## 2014-05-20 MED ORDER — SENNOSIDES-DOCUSATE SODIUM 8.6-50 MG PO TABS
1.0000 | ORAL_TABLET | Freq: Two times a day (BID) | ORAL | Status: DC
  Administered 2014-05-20 – 2014-05-21 (×4): 1 via ORAL
  Filled 2014-05-20 (×4): qty 1

## 2014-05-20 NOTE — Care Management Note (Signed)
    Page 1 of 1   05/20/2014     2:22:26 PM CARE MANAGEMENT NOTE 05/20/2014  Patient:  Savannah [name], Savannah [name]   Account Number:  [id number]  Date Initiated:  05/20/2014  Documentation initiated by:  Dessa Phi  Subjective/Objective Assessment:   59 y/o f admitted w/renal mass.     Action/Plan:   From home.   Anticipated DC Date:  05/21/2014   Anticipated DC Plan:  [place]  CM consult      Choice offered to / List presented to:             Status of service:  In process, will continue to follow Medicare Important Message given?   (If response is "NO", the following Medicare IM given date fields will be blank) Date Medicare IM given:   Medicare IM given by:   Date Additional Medicare IM given:   Additional Medicare IM given by:    Discharge Disposition:    Per UR Regulation:  Reviewed for med. necessity/level of care/duration of stay  If discussed at [place] of Stay Meetings, dates discussed:    Comments:  05/20/14 Dessa Phi RN BSN NCM [id number] POD#1 Partial nephrectomy. No anticipated d/c needs.

## 2014-05-20 NOTE — Op Note (Signed)
NAMEHALEEMA, VANDERHEYDEN NO.:  [id number]  MEDICAL RECORD NO.:  [id number]  LOCATION:  3354                         FACILITY:  [facility]  PHYSICIAN:  Alexis Frock, MD     DATE OF BIRTH:  1955-12-22  DATE OF PROCEDURE: 05/19/2014                                OPERATIVE REPORT   DIAGNOSIS:  Right renal mass.  PROCEDURES: 1. Robotic-assisted right partial nephrectomy. 2. Injection of indocyanine green dye for vascular flow and function. 3. Intraoperative ultrasound with interpretation.  ESTIMATED BLOOD LOSS:  100 mL.  COMPLICATIONS:  None.  SPECIMEN:  Partial nephrectomy.  FINDINGS: 1. Four artery, one vein right renovascular anatomy. 2. Achievement of partial ischemia by clamping two upper arteries,     this was confirmed by indocyanine green dye injection, which     revealed ischemia of the upper pole of the kidney with this     maneuver. 3. Approximately 70% endophytic right renal mass, abutting hilar fat     and hilar vessels by intraoperative ultrasound.  INDICATION:  Ms. Crossett is a pleasant 59 year old lady, who was found on workup of gross hematuria to have an endophytic right renal mass. She also has significant right renal malrotation and complex renovascular anatomy with preoperative imaging felt to be at least three arteries and one vein.  Options were discussed for management including ablative therapies versus surveillance protocols versus partial nephrectomy with and without minimally-invasive assistance and she wished to proceed with the latter.  We discussed preoperatively the endophytic nature of her tumor and the radical nephrectomy may be warranted pending intraoperative findings.  She has good understanding of this, her renal function was normal.  Informed consent was obtained and placed in the medical record.  PROCEDURE IN DETAIL:  Patient being Kseniya Grunden, was verified. Procedure being right robotic partial nephrectomy was  confirmed. Procedure was carried out.  Time-out was performed.  Intravenous antibiotics were administered.  General endotracheal anesthesia was introduced.  The patient was placed into a right side up full flank position applying 15 degrees stable flexion, superior arm elevator, axillary roll, sequential compression devices, and beanbag.  She was further fashioned on the operating table using 3-inch tape over foam padding after Foley catheter was placed per urethra to straight drain. A sterile field was created by completely prepping and draping the patient's right flank and infraxiphoid abdomen using chlorhexidine gluconate.  A high-flow, low-pressure pneumoperitoneum was obtained using Veress technique in the right lower quadrant having passed the aspiration and drop test.  A 12-mm robotic camera port was then placed and positioned approximately 1-1/2 handbreadths superolateral to the umbilicus.  Laparoscopic examination of the peritoneal cavity revealed no significant adhesions, no visceral injury.  Additional ports were then placed as follows; right subcostal 8-mm robotic port, right subxiphoid 5-mm liver retraction port, 12-mm assistant port in the midline 2 fingerbreadths above the camera port, 12-mm assistant port in the midline 3 fingerbreadths below the camera port, 8-mm robotic port, and a right far lateral position 4 fingerbreadths superomedial to the anterior superior iliac spine and a paramedian inferior robotic port approximately 1 handbreadth superior to the pubic ramus.  Robot was docked and passed through electronic checks.  Initial attention was directed at development of the retroperitoneum.  Incision was made lateral to the ascending colon from the cecum towards the area of the hepatic flexure and the colon was carefully swept medially.  The duodenum was encountered and kocherized medially.  The lower pole of the kidney was identified and placed on gentle lateral  traction.  Dissection proceeded medial to this.  The ureter and gonadal vein were encountered. The ureter was carefully swept laterally with the kidney.  Dissection proceeded within this triangle towards the area of the renal hilum.  The renal hilum was quite complex as anticipated and even more so.  There were four arteries and one vein noted.  There was an extreme lower pole accessory artery, this marked a vessel loop.  A lower pole access artery marked a vessel loop.  Both of these were anterior to the inferior vena cava and two small arteries; one at the superior edge of the renal vein and one other at the inferior edge of the renal vein, these were both in their normal position inferior to the vena cava.  These were also marked with vessel loops.  The renal vein was single, thin, but early branching, having marked all four arteries and identified the ureter. Dissection proceeded directly onto the anterior surface of the kidney. Again, the kidney was quite malrotated as expected and dissection proceeded down thus defatting the whole superior half of the kidney. The mass in question was identified grossly on the anterior superior surface and again, this appeared quite endophytic in nature. Intraoperative ultrasound was then performed.  Using drop-in pole, intraoperative ultrasound revealed a well- encapsulated mass that was at least 70% endophytic.  The inferior, lateral and superior margins were noted and marked by scoring the renal parenchyma using the ultrasound guidance.  The deep aspect of this appeared to abut renal sinus fat and the great renal vessels.  It was felt that in the area of the hilum, an enucleation technique would be warranted during the partial nephrectomy, but the mass did appear amenable to this technique.  Given the complex renovascular anatomy and endophytic mass, it was felt that a partial ischemia technique would likely be advantageous with clamping of the  two superior most renal arteries to confirm that this would provide right adequate ischemia.  A single bulldog clamp was placed on the superior most and neck superior most arteries, 2 mL of indocyanine green dye were given after mannitol was given and this confirmed normal fluorescence of the inferior aspect of the kidney, but hyperfluorescence of the superior aspect including the right renal mass, which confirmed regional partial skinning of the kidney and additional bulldog clamp was placed on the aforementioned arteries and partial nephrectomy was carefully performed using robotic scissors with cold technique beginning on the medial aspect and working laterally.  This allowed visualization of the hilar vessels and hilar fat.  A rim of normal parenchyma was kept with this specimen except in the area where the tumor directly abutted the hilum and great renal vessels.  Enucleation technique was performed here.  Specimen was set aside and renorrhaphy was performed first using a layer of running 3-0 V- Loc oversewing one obvious entrance into the collecting system and several small open venous sinuses.  The second 3-0 V-Loc was also used to reapproximate the same area further.  A 2-0 V-Loc was used to provide additional apposition of these structures.  The vessels were then completely unclamped, discontinuing the partial ischemia and parenchymal apposition  was performed using four interrupted 0 Vicryl sutures with an intervening bolster of Surgicel, resulted in excellent parenchyma apposition and excellent hemostasis.  Additional SurgiFlo was applied on this.  All sponge and needle counts were correct.  A perirenal fat pad was placed over the area of the partial nephrectomy.  All vessel loops were removed.  The specimen was placed in EndoCatch bag.  Closed suction drain was brought through the lateral most port site.  Hemostasis appeared excellent.  Robot was then undocked.  The previous  camera port site and superior most 12-mm site were closed at the level of the fascia using Carter-Thomason suture passer.  Specimen was retrieved by extending the inferior most 12-mm assistant port site removing the specimen in its entirety and setting aside for pathology.  The extraction site was closed at the level of the fascia using figure-of- eight PDS x3.  All incision sites were infiltrated with dilute lyophilized Marcaine and then closed at the level of the skin using subcuticular Monocryl followed by Dermabond.  Procedure was then terminated.  The patient tolerated the procedure well.  There were no immediate periprocedural complications.  The patient was taken to the postanesthesia care unit in stable condition.          ______________________________ Alexis Frock, MD     TM/MEDQ  D:  05/19/2014  T:  05/20/2014  Job:  [id number]

## 2014-05-20 NOTE — Progress Notes (Signed)
PHARMACIST - PHYSICIAN COMMUNICATION DR:  Tresa Moore CONCERNING:  METFORMIN SAFE ADMINISTRATION POLICY  RECOMMENDATION: Metformin has been placed on DISCONTINUE (rejected order) STATUS and should be reordered only after any of the conditions below are ruled out.  Current safety recommendations include avoiding metformin for a minimum of 48 hours after the patient's exposure to intravenous contrast media.  DESCRIPTION:  The Pharmacy Committee has adopted a policy that restricts the use of metformin in hospitalized patients until all the contraindications to administration have been ruled out. Specific contraindications are: []  Serum creatinine ? 1.5 for males [x]  Serum creatinine ? 1.4 for females []  Shock, acute MI, sepsis, hypoxemia, dehydration []  Planned administration of intravenous iodinated contrast media []  Heart Failure patients with low EF []  Acute or chronic metabolic acidosis (including DKA)      Gretta Arab PharmD, BCPS Pager [phone] 05/20/2014 7:08 AM

## 2014-05-20 NOTE — Progress Notes (Addendum)
1 Day Post-Op Subjective: C/o more pain diffusely in abdomen today. Reports oral medication not helping adeqauately but IV meds are. Nauseated overnight but better this am. On bed rest so did not get OOB. Tolerating foley and clear liquids.  Objective: Vital signs in last 24 hours: Temp:  [97.8 F (36.6 C)-98.8 F (37.1 C)] 98.8 F (37.1 C) (03/24 0603) Pulse Rate:  [90-100] 93 (03/24 0603) Resp:  [16-20] 18 (03/24 0155) BP: (95-155)/(43-72) 115/43 mmHg (03/24 0603) SpO2:  [93 %-100 %] 94 % (03/24 0155) Weight:  [110.224 kg (243 lb)] 110.224 kg (243 lb) (03/23 0645)  Intake/Output from previous day: 03/23 0701 - 03/24 0700 In: 4552.9 [P.O.:480; I.V.:4072.9] Out: 825 [Urine:430; Drains:195; Blood:200] Intake/Output this shift: Total I/O In: 2302.9 [P.O.:480; I.V.:1822.9] Out: 415 [Urine:310; Drains:105]  Physical Exam:  General: Alert and oriented CV: RRR Lungs: normal WOB with CPAP on Abdomen: Soft, ND, mildly distended and tender Incisions: c/d/i JP: serosang Foley: clear with small amount of bloody sediment Ext: NT, No erythema  Lab Results:  Recent Labs  05/19/14 1310 05/20/14 0551  HGB 11.6* 10.2*  HCT 34.7* 31.0*   BMET  Recent Labs  05/19/14 0703  K 3.1*     Studies/Results: No results found.  Assessment/Plan: POD#1 from right robotic partial nephrectomy. Doing well.   Increase oxy to 15q4 D/C foley JP Cr today Ambulate Regular diet [place] pending pain control.   LOS: 1 day   Milon Score 05/20/2014, 6:38 AM    I have seen and examined the patient and agree with plan as summarized below:  1 - Right Renal Mass - s/p robotic partial nephrecotmy 05/19/14 for endophytic mass, path pending.  Today [name] c/o surgical site soreness. NO n/v. Hgb stable. NO drain / foley problems. She takes chronic narcotics pre-op  O: NAD, family ast bedisde On CPAP nasal SNTND, obese, surgical sties c/d/i. NO r/g. JP with serosanguinous  fluid Foley with very light pink urine SCD's in place  A/P:  Doing well POD1. Ambulate, continue IVF, incrase PO pain meds. DC FOley, Check JP Cr. Remain in house for now.

## 2014-05-21 LAB — BASIC METABOLIC PANEL
ANION GAP: 9 (ref 5–15)
BUN: 19 mg/dL (ref 6–23)
CO2: 27 mmol/L (ref 19–32)
CREATININE: 1.45 mg/dL — AB (ref 0.50–1.10)
Calcium: 8 mg/dL — ABNORMAL LOW (ref 8.4–10.5)
Chloride: 98 mmol/L (ref 96–112)
GFR calc Af Amer: 45 mL/min — ABNORMAL LOW (ref >90)
GFR calc non Af Amer: 39 mL/min — ABNORMAL LOW (ref >90)
Glucose, Bld: 150 mg/dL — ABNORMAL HIGH (ref 70–99)
POTASSIUM: 3.3 mmol/L — AB (ref 3.5–5.1)
SODIUM: 134 mmol/L — AB (ref 135–145)

## 2014-05-21 LAB — GLUCOSE, CAPILLARY
Glucose-Capillary: 143 mg/dL — ABNORMAL HIGH (ref 70–99)
Glucose-Capillary: 160 mg/dL — ABNORMAL HIGH (ref 70–99)
Glucose-Capillary: 111 mg/dL — ABNORMAL HIGH (ref 70–99)
Glucose-Capillary: 181 mg/dL — ABNORMAL HIGH (ref 70–99)

## 2014-05-21 LAB — HEMOGLOBIN AND HEMATOCRIT, BLOOD
HCT: 30.3 % — ABNORMAL LOW (ref 36.0–46.0)
HEMOGLOBIN: 10.1 g/dL — AB (ref 12.0–15.0)

## 2014-05-21 MED ORDER — PIPERACILLIN-TAZOBACTAM 3.375 G IVPB
3.3750 g | Freq: Three times a day (TID) | INTRAVENOUS | Status: DC
  Administered 2014-05-21 – 2014-05-22 (×4): 3.375 g via INTRAVENOUS
  Filled 2014-05-21 (×5): qty 50

## 2014-05-21 NOTE — Progress Notes (Signed)
RT Note:  Patient resting comfortably on CPAP via nasal prongs, 10.0 cm H20 with 2 lpm O2 bleed in .

## 2014-05-21 NOTE — Progress Notes (Signed)
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 JP removed POD 1 as output minimal and Cr same as serum.   2 - Low Grade Fever, Suspect Atelectasis v. Early Pneumonia - New low grade fevers 3/24, TM 100.5. Pt slow to ambulate. CXR 3/25 with atelectasis and possible early LLL infiltrate, placed on empiric zosyn. No localizing symptoms / wound problems.   Today [name] is w/o complaints. Low grade fever noted. Tollerating regular diet. Ambulating some, but not much. We reviewd path report yesterday afternoon.   Objective: Vital signs in last 24 hours: Temp:  [99.5 F (37.5 C)-100.5 F (38.1 C)] 99.6 F (37.6 C) (03/25 0545) Pulse Rate:  [88-99] 99 (03/25 0545) Resp:  [14-16] 16 (03/25 0545) BP: (104-120)/(50-55) 119/53 mmHg (03/25 0545) SpO2:  [90 %-98 %] 90 % (03/25 0545) Last BM Date: 05/18/14  Intake/Output from previous day: 03/24 0701 - 03/25 0700 In: 1343.8 [P.O.:240; I.V.:1103.8] Out: 1470 [Urine:1400; Drains:70] Intake/Output this shift: Total I/O In: 1103.8 [I.V.:1103.8] Out: 1300 [Urine:1300]  General appearance: alert, cooperative, appears stated age and husband at bedside Eyes: negative Nose: Nares normal. Septum midline. Mucosa normal. No drainage or sinus tenderness. Throat: lips, mucosa, and tongue normal; teeth and gums normal Neck: supple, symmetrical, trachea midline Back: symmetric, no curvature. ROM normal. No CVA tenderness. Resp: on nasal CPAP Cardio: regular rythem GI: soft, non-tender; bowel sounds normal; no masses,  no organomegaly and obese Extremities: extremities normal, atraumatic, no cyanosis or edema Pulses: 2+ and symmetric Skin: Skin color, texture, turgor normal. No rashes or lesions Lymph nodes: Cervical, supraclavicular, and axillary nodes normal. Neurologic: Grossly normal Incision/Wound: recent port sites, extraction sites, drain sites c/d/i. NO  erythema.   Lab Results:   Recent Labs  05/20/14 0551 05/21/14 0520  HGB 10.2* 10.1*  HCT 31.0* 30.3*   BMET  Recent Labs  05/20/14 0551 05/21/14 0520  NA 135 134*  K 3.9 3.3*  CL 97 98  CO2 27 27  GLUCOSE 139* 150*  BUN 21 19  CREATININE 1.99* 1.45*  CALCIUM 8.1* 8.0*   PT/INR No results for input(s): LABPROT, INR in the last 72 hours. ABG No results for input(s): PHART, HCO3 in the last 72 hours.  Invalid input(s): PCO2, PO2  Studies/Results: Dg Chest Port 1 View  05/21/2014   CLINICAL DATA:  Acute onset of fever.  Initial encounter.  EXAM: PORTABLE CHEST - 1 VIEW  COMPARISON:  Chest radiograph performed 02/22/2013  FINDINGS: The lungs are hypoexpanded. Left basilar airspace opacity raises question for pneumonia, given the patient's symptoms. No pleural effusion or pneumothorax is seen.  The cardiomediastinal silhouette is borderline enlarged. No acute osseous abnormalities are identified.  IMPRESSION: Lungs hypoexpanded. Left basilar airspace opacity raises question for pneumonia, given the patient's symptoms. Borderline cardiomegaly.   Electronically Signed   By: Garald Balding M.D.   On: 05/21/2014 01:22    Anti-infectives: Anti-infectives    Start     Dose/Rate Route Frequency Ordered Stop   05/21/14 0600  piperacillin-tazobactam (ZOSYN) IVPB 3.375 g     3.375 g 12.5 mL/hr over 240 Minutes Intravenous 3 times per day 05/21/14 0536     05/19/14 0626  ceFAZolin (ANCEF) IVPB 2 g/50 mL premix     2 g 100 mL/hr over 30 Minutes Intravenous 30 min pre-op 05/19/14 0086 05/19/14 0840      Assessment/Plan:  1 - Right Renal Cell Carcinoma -  doing well POD 2. Path discussed as well as post-op imaging surveillance protocol.   2 - Low Grade Fever, Suspect Atelectasis v. Early Pneumonia - Likely atelectasis. Given early infiltrate and recent GETA, will place on empiric zosyn for early pneumonia. Encouraged IS, OOB, ambulate. Consider further eval if develops high-grade  fever.   3 - Remain in house  [facility], [place] 05/21/2014

## 2014-05-22 LAB — GLUCOSE, CAPILLARY: Glucose-Capillary: 130 mg/dL — ABNORMAL HIGH (ref 70–99)

## 2014-05-22 MED ORDER — SENNOSIDES-DOCUSATE SODIUM 8.6-50 MG PO TABS: 1.0000 | ORAL_TABLET | Freq: Two times a day (BID) | ORAL | Status: DC

## 2014-05-22 MED ORDER — OXYCODONE HCL 10 MG PO TABS: 10.0000 mg | ORAL_TABLET | Freq: Four times a day (QID) | ORAL | Status: DC | PRN

## 2014-05-22 NOTE — Discharge Summary (Signed)
Physician Discharge Summary  Patient ID: Savannah [name] MRN: [id number] DOB/AGE: April 06, 1955 59 y.o.  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 JP removed POD 1 as output minimal and Cr same as serum.   2 - Low Grade Fever, Suspect Atelectasis v. Early Pneumonia - New low grade fevers 3/24, TM 100.5. Pt slow to ambulate. CXR 3/25 with atelectasis and possible early LLL infiltrate, placed on empiric zosyn. No localizing symptoms / wound problems. Low grade quickly resolved and afebrile nearly 24 hrs by time of DC.  By 3/26, the day of discharge, pt is ambulatory, tollerating regular diet with resumed bowel funciton, pain controlled on PO meds, afebrile, and felt to be adequate for discharge.   Consults: None  Significant Diagnostic Studies: labs: sugical pathology as per above. Cr at DC <1.5.  Treatments: surgery: robotic partial nephrecotmy 05/19/14   Discharge Exam: Blood pressure 134/51, pulse 91, temperature 98.6 F (37 C), temperature source Oral, resp. rate 18, height 5\' 8"  (1.727 m), weight 110.224 kg (243 lb), SpO2 91 %. General appearance: alert, cooperative and appears stated age Head: Normocephalic, without obvious abnormality, atraumatic Eyes: negative Nose: Nares normal. Septum midline. Mucosa normal. No drainage or sinus tenderness. Throat: lips, mucosa, and tongue normal; teeth and gums normal Neck: supple, symmetrical, trachea midline Back: symmetric, no curvature. ROM normal. No CVA tenderness. Resp: non-labored on room air Cardio: regular rate and rhythm, S1, S2 normal, no murmur, click, rub or gallop GI: soft, non-tender; bowel sounds normal; no masses,  no  organomegaly Extremities: extremities normal, atraumatic, no cyanosis or edema Pulses: 2+ and symmetric Skin: Skin color, texture, turgor normal. No rashes or lesions Lymph nodes: Cervical, supraclavicular, and axillary nodes normal. Neurologic: Grossly normal Incision/Wound: Recent port sites / extraction sites / drain site c/d/i. No erythema.   Disposition: 01-Home or Self Care     Medication List    STOP taking these medications        cephALEXin 500 MG capsule  Commonly known as:  KEFLEX     Efinaconazole 10 % Soln  Commonly known as:  JUBLIA      TAKE these medications        albuterol 108 (90 BASE) MCG/ACT inhaler  Commonly known as:  PROVENTIL HFA;VENTOLIN HFA  Inhale 2 puffs into the lungs every 4 (four) hours as needed for wheezing or shortness of breath (cough, shortness of breath or wheezing.).     atorvastatin 40 MG tablet  Commonly known as:  LIPITOR  TAKE 1 TABLET DAILY     citalopram 40 MG tablet  Commonly known as:  CELEXA  TAKE ONE TABLET BY MOUTH ONCE DAILY     gabapentin 300 MG capsule  Commonly known as:  NEURONTIN  TAKE 1 CAPSULE (300 MG TOTAL) BY MOUTH AT BEDTIME.     hyoscyamine 0.125 MG SL tablet  Commonly known as:  LEVSIN SL  Take 1-2 tablets by mouth or under tongue every 4 hours as needed.     levothyroxine 125 MCG tablet  Commonly known as:  SYNTHROID, LEVOTHROID  Take 1 tablet (125 mcg total) by mouth daily before breakfast.     losartan-hydrochlorothiazide 100-12.5 MG per tablet  Commonly known as:  HYZAAR  TAKE 1  TABLET DAILY     metFORMIN 500 MG 24 hr tablet  Commonly known as:  GLUCOPHAGE-XR  TAKE 2 TABLETS TWICE A DAY     metoprolol succinate 25 MG 24 hr tablet  Commonly known as:  TOPROL-XL  TAKE 1 TABLET BY MOUTH EVERY DAY     metoprolol succinate 25 MG 24 hr tablet  Commonly known as:  TOPROL-XL  TAKE 1 TABLET BY MOUTH EVERY DAY     mometasone-formoterol 100-5 MCG/ACT Aero  Commonly known as:  DULERA  Inhale 2  puffs into the lungs 2 (two) times daily.     omeprazole 40 MG capsule  Commonly known as:  PRILOSEC  Take 1 capsule (40 mg total) by mouth daily.     ondansetron 4 MG tablet  Commonly known as:  ZOFRAN  Take 1 tablet (4 mg total) by mouth every 6 (six) hours as needed for nausea.     pioglitazone 30 MG tablet  Commonly known as:  ACTOS  Take 1 tablet (30 mg total) by mouth daily.     traZODone 100 MG tablet  Commonly known as:  DESYREL  Take 1 tablet (100 mg total) by mouth at bedtime.      ASK your doctor about these medications        Oxycodone HCl 10 MG Tabs  Take 1 tablet (10 mg total) by mouth every 4 (four) hours as needed. For pain           Follow-up Information    Follow up with Alexis Frock, MD On 06/03/2014.   Specialty:  Urology   Why:  at 10:45   Contact information:   [address] [place] [address] [phone]       Signed: Alexis Frock 05/22/2014, 7:52 AM

## 2014-05-24 NOTE — Progress Notes (Signed)
Discharge summary sent to payer through [facility]  

## 2014-06-04 NOTE — Op Note (Signed)
Expand All Collapse All   NAME: Savannah [name], PAIR ACCOUNT NO.: [id number]  MEDICAL RECORD NO.: [id number]  LOCATION: 5462 FACILITY: [facility]  PHYSICIAN: Alexis Frock, MD DATE OF BIRTH: 22-Oct-1955  DATE OF PROCEDURE: 05/19/2014     OPERATIVE REPORT   DIAGNOSIS: Right renal mass.   PROCEDURES: 1. Robotic-assisted right partial nephrectomy. 2. Injection of indocyanine green dye for vascular flow and function. 3. Intraoperative ultrasound with interpretation.  ASSISTANT: Clemetine Marker, PA  ESTIMATED BLOOD LOSS: 100 mL.  COMPLICATIONS: None.  SPECIMEN: Partial nephrectomy.  FINDINGS: 1. Four artery, one vein right renovascular anatomy. 2. Achievement of partial ischemia by clamping two upper arteries,  this was confirmed by indocyanine green dye injection, which  revealed ischemia of the upper pole of the kidney with this  maneuver. 3. Approximately 70% endophytic right renal mass, abutting hilar fat  and hilar vessels by intraoperative ultrasound.  INDICATION: Savannah [name] is a pleasant 59 year old lady, who was found on workup of gross hematuria to have an endophytic right renal mass. She also has significant right renal malrotation and complex renovascular anatomy with preoperative imaging felt to be at least three arteries and one vein. Options were discussed for management including ablative therapies versus surveillance protocols versus partial nephrectomy with and without minimally-invasive assistance and she wished to proceed with the latter. We discussed preoperatively the endophytic nature of her tumor and the radical nephrectomy may be warranted pending intraoperative findings. She has good understanding of this, her renal function was normal. Informed consent was obtained and placed in the medical record.  PROCEDURE IN DETAIL: Patient being Savannah [name], was  verified. Procedure being right robotic partial nephrectomy was confirmed. Procedure was carried out. Time-out was performed. Intravenous antibiotics were administered. General endotracheal anesthesia was introduced. The patient was placed into a right side up full flank position applying 15 degrees stable flexion, superior arm elevator, axillary roll, sequential compression devices, and beanbag. She was further fashioned on the operating table using 3-inch tape over foam padding after Foley catheter was placed per urethra to straight drain. A sterile field was created by completely prepping and draping the patient's right flank and infraxiphoid abdomen using chlorhexidine gluconate. A high-flow, low-pressure pneumoperitoneum was obtained using Veress technique in the right lower quadrant having passed the aspiration and drop test. A 12-mm robotic camera port was then placed and positioned approximately 1-1/2 handbreadths superolateral to the umbilicus. Laparoscopic examination of the peritoneal cavity revealed no significant adhesions, no visceral injury. Additional ports were then placed as follows; right subcostal 8-mm robotic port, right subxiphoid 5-mm liver retraction port, 12-mm assistant port in the midline 2 fingerbreadths above the camera port, 12-mm assistant port in the midline 3 fingerbreadths below the camera port, 8-mm robotic port, and a right far lateral position 4 fingerbreadths superomedial to the anterior superior iliac spine and a paramedian inferior robotic port approximately 1 handbreadth superior to the pubic ramus. Robot was docked and passed through electronic checks. Initial attention was directed at development of the retroperitoneum. Incision was made lateral to the ascending colon from the cecum towards the area of the hepatic flexure and the colon was carefully swept medially. The duodenum was encountered and kocherized medially. The lower pole  of the kidney was identified and placed on gentle lateral traction. Dissection proceeded medial to this. The ureter and gonadal vein were encountered. The ureter was carefully swept laterally with the kidney. Dissection proceeded within this triangle towards the area of the renal hilum. The renal hilum was quite complex  as anticipated and even more so. There were four arteries and one vein noted. There was an extreme lower pole accessory artery, this marked a vessel loop. A lower pole access artery marked a vessel loop. Both of these were anterior to the inferior vena cava and two small arteries; one at the superior edge of the renal vein and one other at the inferior edge of the renal vein, these were both in their normal position inferior to the vena cava. These were also marked with vessel loops. The renal vein was single, thin, but early branching, having marked all four arteries and identified the ureter. Dissection proceeded directly onto the anterior surface of the kidney. Again, the kidney was quite malrotated as expected and dissection proceeded down thus defatting the whole superior half of the kidney. The mass in question was identified grossly on the anterior superior surface and again, this appeared quite endophytic in nature. Intraoperative ultrasound was then performed.  Using drop-in pole, intraoperative ultrasound revealed a well- encapsulated mass that was at least 70% endophytic. The inferior, lateral and superior margins were noted and marked by scoring the renal parenchyma using the ultrasound guidance. The deep aspect of this appeared to abut renal sinus fat and the great renal vessels. It was felt that in the area of the hilum, an enucleation technique would be warranted during the partial nephrectomy, but the mass did appear amenable to this technique. Given the complex renovascular anatomy and endophytic mass, it was felt that a partial ischemia  technique would likely be advantageous with clamping of the two superior most renal arteries to confirm that this would provide right adequate ischemia. A single bulldog clamp was placed on the superior most and neck superior most arteries, 2 mL of indocyanine green dye were given after mannitol was given and this confirmed normal fluorescence of the inferior aspect of the kidney, but hyperfluorescence of the superior aspect including the right renal mass, which confirmed regional partial skinning of the kidney and additional bulldog clamp was placed on the aforementioned arteries and partial nephrectomy was carefully performed using robotic scissors with cold technique beginning on the medial aspect and working laterally. This allowed visualization of the hilar vessels and hilar fat. A rim of normal parenchyma was kept with this specimen except in the area where the tumor directly abutted the hilum and great renal vessels. Enucleation technique was performed here. Specimen was set aside and renorrhaphy was performed first using a layer of running 3-0 V- Loc oversewing one obvious entrance into the collecting system and several small open venous sinuses. The second 3-0 V-Loc was also used to reapproximate the same area further. A 2-0 V-Loc was used to provide additional apposition of these structures. The vessels were then completely unclamped, discontinuing the partial ischemia and parenchymal apposition was performed using four interrupted 0 Vicryl sutures with an intervening bolster of Surgicel, resulted in excellent parenchyma apposition and excellent hemostasis. Additional SurgiFlo was applied on this. All sponge and needle counts were correct. A perirenal fat pad was placed over the area of the partial nephrectomy. All vessel loops were removed. The specimen was placed in EndoCatch bag. Closed suction drain was brought through the lateral most port site.  Hemostasis appeared excellent. Robot was then undocked. The previous camera port site and superior most 12-mm site were closed at the level of the fascia using Carter-Thomason suture passer. Specimen was retrieved by extending the inferior most 12-mm assistant port site removing the specimen in its entirety and  setting aside for pathology. The extraction site was closed at the level of the fascia using figure-of- eight PDS x3. All incision sites were infiltrated with dilute lyophilized Marcaine and then closed at the level of the skin using subcuticular Monocryl followed by Dermabond. Procedure was then terminated. The patient tolerated the procedure well. There were no immediate periprocedural complications. The patient was taken to the postanesthesia care unit in stable condition.     ______________________________ Alexis Frock, MD     TM/MEDQ D: 05/19/2014

## 2014-06-05 ENCOUNTER — Other Ambulatory Visit: Payer: Self-pay | Admitting: Endocrinology

## 2014-06-07 NOTE — Telephone Encounter (Signed)
Rx sent to pharmacy   

## 2014-06-07 NOTE — Telephone Encounter (Signed)
Please refill each x 1 Ov is due

## 2014-06-11 ENCOUNTER — Encounter: Payer: Self-pay | Admitting: Endocrinology

## 2014-06-12 ENCOUNTER — Other Ambulatory Visit: Payer: Self-pay | Admitting: Endocrinology

## 2014-06-14 ENCOUNTER — Other Ambulatory Visit: Payer: Self-pay

## 2014-06-14 MED ORDER — METFORMIN HCL ER 500 MG PO TB24: 1000.0000 mg | ORAL_TABLET | Freq: Two times a day (BID) | ORAL | Status: DC

## 2014-06-14 MED ORDER — ATORVASTATIN CALCIUM 40 MG PO TABS: 40.0000 mg | ORAL_TABLET | Freq: Every day | ORAL | Status: DC

## 2014-06-14 MED ORDER — LOSARTAN POTASSIUM-HCTZ 100-12.5 MG PO TABS: 1.0000 | ORAL_TABLET | Freq: Every day | ORAL | Status: DC

## 2014-06-25 ENCOUNTER — Encounter: Payer: Self-pay | Admitting: Endocrinology

## 2014-06-25 ENCOUNTER — Ambulatory Visit (INDEPENDENT_AMBULATORY_CARE_PROVIDER_SITE_OTHER): Payer: Managed Care, Other (non HMO) | Admitting: Endocrinology

## 2014-06-25 VITALS — BP 126/88 | HR 94 | Temp 98.2°F | Ht 68.0 in | Wt 233.0 lb

## 2014-06-25 DIAGNOSIS — E039 Hypothyroidism, unspecified: Secondary | ICD-10-CM | POA: Diagnosis not present

## 2014-06-25 DIAGNOSIS — E119 Type 2 diabetes mellitus without complications: Secondary | ICD-10-CM

## 2014-06-25 DIAGNOSIS — D62 Acute posthemorrhagic anemia: Secondary | ICD-10-CM | POA: Diagnosis not present

## 2014-06-25 LAB — CBC WITH DIFFERENTIAL/PLATELET
BASOS ABS: 0.1 10*3/uL (ref 0.0–0.1)
Basophils Relative: 0.9 % (ref 0.0–3.0)
EOS ABS: 0.4 10*3/uL (ref 0.0–0.7)
Eosinophils Relative: 4.8 % (ref 0.0–5.0)
HCT: 36.1 % (ref 36.0–46.0)
Hemoglobin: 12.2 g/dL (ref 12.0–15.0)
LYMPHS ABS: 2.1 10*3/uL (ref 0.7–4.0)
LYMPHS PCT: 29.1 % (ref 12.0–46.0)
MCHC: 33.9 g/dL (ref 30.0–36.0)
MCV: 87 fl (ref 78.0–100.0)
MONO ABS: 0.3 10*3/uL (ref 0.1–1.0)
MONOS PCT: 4.6 % (ref 3.0–12.0)
NEUTROS ABS: 4.4 10*3/uL (ref 1.4–7.7)
Neutrophils Relative %: 60.6 % (ref 43.0–77.0)
PLATELETS: 174 10*3/uL (ref 150.0–400.0)
RBC: 4.15 Mil/uL (ref 3.87–5.11)
RDW: 15.5 % (ref 11.5–15.5)
WBC: 7.3 10*3/uL (ref 4.0–10.5)

## 2014-06-25 LAB — HEMOGLOBIN A1C: HEMOGLOBIN A1C: 5.5 % (ref 4.6–6.5)

## 2014-06-25 LAB — T4, FREE: Free T4: 1.05 ng/dL (ref 0.60–1.60)

## 2014-06-25 LAB — IBC PANEL
Iron: 51 ug/dL (ref 42–145)
Saturation Ratios: 11.9 % — ABNORMAL LOW (ref 20.0–50.0)
Transferrin: 307 mg/dL (ref 212.0–360.0)

## 2014-06-25 LAB — TSH: TSH: 3.72 u[IU]/mL (ref 0.35–4.50)

## 2014-06-25 MED ORDER — CITALOPRAM HYDROBROMIDE 20 MG PO TABS: 20.0000 mg | ORAL_TABLET | Freq: Every day | ORAL | Status: DC

## 2014-06-25 MED ORDER — CEFUROXIME AXETIL 250 MG PO TABS: 250.0000 mg | ORAL_TABLET | Freq: Two times a day (BID) | ORAL | Status: AC

## 2014-06-25 MED ORDER — TRAZODONE HCL 100 MG PO TABS: 100.0000 mg | ORAL_TABLET | Freq: Every day | ORAL | Status: DC

## 2014-06-25 NOTE — Progress Notes (Signed)
Subjective:    Patient ID: Savannah [name], female    DOB: 1956-01-20, 59 y.o.   MRN: [id number]  HPI Pt states 4 days of prod-quality cough in the chest, and assoc nasal congestion. Past Medical History  Diagnosis Date  . Unspecified hypothyroidism 01/07/2007  . 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 HYPERCHOLESTEROLEMIA 04/18/2009  . HYPOKALEMIA 01/15/2007  . ANEMIA, IRON DEFICIENCY 12/06/2008  . HYPERTENSION 09/27/2006  . ALLERGIC RHINITIS CAUSE UNSPECIFIED 12/29/2007  . ASTHMA 09/27/2006  . FATTY LIVER DISEASE 03/19/2008  . URINARY CALCULUS 11/08/2009  . 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 BACK PAIN, LUMBAR 12/29/2009  . Palpitations 05/23/2009  . ASYMPTOMATIC POSTMENOPAUSAL STATUS 03/19/2008  . Chronic kidney disease   . OBSTRUCTIVE SLEEP APNEA 08/09/2009  . Sleep apnea     wears a c-pap- settings at 9   . Depression     Past Surgical History  Procedure Laterality Date  . Knee arthroscopy Left 1993  . Cardiac catheterization  2011  . Compound fracture Right     arm  . Right foot surgery     . Robotic assited partial nephrectomy Right 05/19/2014    Procedure: ROBOTIC ASSITED PARTIAL NEPHRECTOMY;  Surgeon: Alexis Frock, MD;  Location: [facility];  Service: Urology;  Laterality: Right;    History   Social History  . Marital Status: Married    Spouse Name: N/A  . Number of Children: 2  . Years of Education: N/A   Occupational History  . Homemaker    Social History Main Topics  . Smoking status: Former Smoker -- 0.30 packs/day for 2 years    Types: Cigarettes    Quit date: 02/27/1975  . Smokeless tobacco: Never Used  . Alcohol Use: No  . Drug Use: No  . Sexual Activity: Not on file   Other Topics Concern  . 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 albuterol (PROVENTIL HFA;VENTOLIN HFA) 108 (90 BASE) MCG/ACT inhaler Inhale 2 puffs into  the lungs every 4 (four) hours as needed for wheezing or shortness of breath (cough, shortness of breath or wheezing.). 1 Inhaler 12  . atorvastatin (LIPITOR) 40 MG tablet Take 1 tablet (40 mg total) by mouth daily. 90 tablet 0  . gabapentin (NEURONTIN) 300 MG capsule TAKE 1 CAPSULE (300 MG TOTAL) BY MOUTH AT BEDTIME. 30 capsule 5  . hyoscyamine (LEVSIN SL) 0.125 MG SL tablet Take 1-2 tablets by mouth or under tongue every 4 hours as needed. 100 tablet 5  . levothyroxine (SYNTHROID, LEVOTHROID) 125 MCG tablet Take 1 tablet by mouth every day before breakfast. *APPOINTMENT NEEDED FOR FURTHER REFILLS* 30 tablet 0  . losartan-hydrochlorothiazide (HYZAAR) 100-12.5 MG per tablet Take 1 tablet by mouth daily. 90 tablet 0  . metFORMIN (GLUCOPHAGE-XR) 500 MG 24 hr tablet Take 2 tablets (1,000 mg total) by mouth 2 (two) times daily. 360 tablet 0  . metoprolol succinate (TOPROL-XL) 25 MG 24 hr tablet TAKE 1 TABLET BY MOUTH EVERY DAY 90 tablet 1  . mometasone-formoterol (DULERA) 100-5 MCG/ACT AERO Inhale 2 puffs into the lungs 2 (two) times daily. 1 Inhaler 11  . omeprazole (PRILOSEC) 40 MG capsule Take 1 capsule (40 mg total) by mouth daily. 30 capsule 11  . ondansetron (ZOFRAN) 4 MG tablet Take 1 tablet (4 mg total) by mouth every 6 (six) hours as needed for nausea.  30 tablet 1  . Oxycodone HCl 10 MG TABS Take 1-2 tablets (10-20 mg total) by mouth every 6 (six) hours as needed. For post-op pain 60 each 0  . pioglitazone (ACTOS) 30 MG tablet Take 1 tablet (30 mg total) by mouth daily. 30 tablet 11  . senna-docusate (SENOKOT-S) 8.6-50 MG per tablet Take 1 tablet by mouth 2 (two) times daily. While taking pain meds to prevent constipation 30 tablet 0   No current facility-administered medications on file prior to visit.    Allergies  Allergen Reactions  . Erythromycin Nausea And Vomiting    REACTION: nausea/vomiting    Family History  Problem Relation Age of Onset  . Colon cancer Mother 52  . Diabetes  Mother   . Kidney disease Mother   . Heart attack Father   . Heart disease Father   . Allergies Sister   . Asthma Sister   . Clotting disorder Grandchild   . Heart disease Paternal Grandmother   . Esophageal cancer Neg Hx   . Rectal cancer Neg Hx   . Stomach cancer Neg Hx     BP 126/88 mmHg  Pulse 94  Temp(Src) 98.2 F (36.8 C) (Oral)  Ht 5\' 8"  (1.727 m)  Wt 233 lb (105.688 kg)  BMI 35.44 kg/m2  SpO2 95%    Review of Systems She has bilat earache and fever.    Objective:   Physical Exam VITAL SIGNS:  See vs page GENERAL: no distress head: no deformity eyes: no periorbital swelling, no proptosis external nose and ears are normal mouth: no lesion seen Ears: both tm's are both red LUNGS:  Clear to auscultation   Lab Results  Component Value Date   WBC 7.3 06/25/2014   HGB 12.2 06/25/2014   HCT 36.1 06/25/2014   MCV 87.0 06/25/2014   PLT 174.0 06/25/2014   Lab Results  Component Value Date   IRON 51 06/25/2014   Lab Results  Component Value Date   TSH 3.72 06/25/2014   Lab Results  Component Value Date   HGBA1C 5.5 06/25/2014       Assessment & Plan:  URI: new DM: well-controlled Iron deficiency, worse Hypothyroidism: well-replaced. Depression: there is a drug interaction between citalopram and trazodone.   Patient is advised the following: Patient Instructions  i have sent a prescription to your pharmacy, for an antibiotic. Loratadine-d (non-prescription) will help your congestion.  Please reduce the citalopram, due to an interaction with trazodone.

## 2014-06-25 NOTE — Patient Instructions (Addendum)
i have sent a prescription to your pharmacy, for an antibiotic. Loratadine-d (non-prescription) will help your congestion.  Please reduce the citalopram, due to an interaction with trazodone.

## 2014-06-29 ENCOUNTER — Ambulatory Visit: Payer: Managed Care, Other (non HMO)

## 2014-07-04 ENCOUNTER — Other Ambulatory Visit: Payer: Self-pay | Admitting: Endocrinology

## 2014-07-07 ENCOUNTER — Other Ambulatory Visit: Payer: Self-pay | Admitting: Endocrinology

## 2014-07-23 ENCOUNTER — Other Ambulatory Visit: Payer: Self-pay

## 2014-07-23 ENCOUNTER — Ambulatory Visit: Payer: Commercial Indemnity | Admitting: Pulmonary Disease

## 2014-07-26 ENCOUNTER — Other Ambulatory Visit: Payer: Self-pay

## 2014-07-27 ENCOUNTER — Encounter: Payer: Self-pay | Admitting: Pulmonary Disease

## 2014-07-27 ENCOUNTER — Ambulatory Visit (INDEPENDENT_AMBULATORY_CARE_PROVIDER_SITE_OTHER): Payer: Managed Care, Other (non HMO) | Admitting: Pulmonary Disease

## 2014-07-27 VITALS — BP 118/62 | HR 70 | Temp 98.6°F | Ht 67.5 in | Wt 237.6 lb

## 2014-07-27 DIAGNOSIS — G4733 Obstructive sleep apnea (adult) (pediatric): Secondary | ICD-10-CM | POA: Diagnosis not present

## 2014-07-27 NOTE — Progress Notes (Signed)
   Subjective:    Patient ID: Savannah [name], female    DOB: 1955-12-30, 59 y.o.   MRN: [id number]  HPI The patient comes in today for follow-up of her obstructive sleep apnea. She is doing well with her device, and is having no issues with mask fit or pressure. She is sleeping well, and satisfied with her daytime alertness. She has gained some weight since the last visit, but tells me that she has had recent cancer surgery and that kept her from being as active as she had been.   Review of Systems  Constitutional: Negative for fever and unexpected weight change.  HENT: Negative for congestion, dental problem, ear pain, nosebleeds, postnasal drip, rhinorrhea, sinus pressure, sneezing, sore throat and trouble swallowing.   Eyes: Negative for redness and itching.  Respiratory: Negative for cough, chest tightness, shortness of breath and wheezing.   Cardiovascular: Negative for palpitations and leg swelling.  Gastrointestinal: Negative for nausea and vomiting.  Genitourinary: Negative for dysuria.  Musculoskeletal: Negative for joint swelling.  Skin: Negative for rash.  Neurological: Negative for headaches.  Hematological: Does not bruise/bleed easily.  Psychiatric/Behavioral: Negative for dysphoric mood. The patient is not nervous/anxious.        Objective:   Physical Exam Overweight female in no acute distress Nose without purulence or discharge noted Neck without lymphadenopathy or thyromegaly No skin breakdown or pressure necrosis from the C Pap mask Lower extremities with mild edema, no cyanosis Alert and oriented, moves all 4 extremities.        Assessment & Plan:

## 2014-07-27 NOTE — Assessment & Plan Note (Signed)
The patient continues on C Pap and feels that she is doing very well with her device. He is satisfied with her sleep and daytime alertness, and is having no issues with her pressure or mask fit. She has gained some weight back after her recent cancer surgery, but she is working on getting back to her baseline weight.

## 2014-07-27 NOTE — Patient Instructions (Signed)
Continue on cpap, and keep up with mask changes and supplies. Keep working on weight loss followup with Dr. Sood in one year.  

## 2014-07-28 ENCOUNTER — Telehealth: Payer: Self-pay | Admitting: Pulmonary Disease

## 2014-07-28 DIAGNOSIS — G4733 Obstructive sleep apnea (adult) (pediatric): Secondary | ICD-10-CM

## 2014-07-28 NOTE — Telephone Encounter (Signed)
New order placed. Called and made Melissa aware. Nothing further needed.

## 2014-07-29 ENCOUNTER — Other Ambulatory Visit: Payer: Self-pay | Admitting: Endocrinology

## 2014-08-28 ENCOUNTER — Other Ambulatory Visit: Payer: Self-pay | Admitting: Endocrinology

## 2014-08-31 ENCOUNTER — Other Ambulatory Visit: Payer: Self-pay | Admitting: Endocrinology

## 2014-09-21 ENCOUNTER — Other Ambulatory Visit: Payer: Self-pay

## 2014-09-21 DIAGNOSIS — Z1231 Encounter for screening mammogram for malignant neoplasm of breast: Secondary | ICD-10-CM

## 2014-09-24 ENCOUNTER — Ambulatory Visit
Admission: RE | Admit: 2014-09-24 | Discharge: 2014-09-24 | Disposition: A | Discharge status: Home / Self Care | Payer: Managed Care, Other (non HMO) | Source: Ambulatory Visit

## 2014-09-24 DIAGNOSIS — Z1231 Encounter for screening mammogram for malignant neoplasm of breast: Secondary | ICD-10-CM

## 2014-10-01 ENCOUNTER — Other Ambulatory Visit: Payer: Self-pay | Admitting: Endocrinology

## 2014-10-05 ENCOUNTER — Telehealth: Payer: Self-pay

## 2014-10-05 MED ORDER — FLUTICASONE-SALMETEROL 100-50 MCG/DOSE IN AEPB: 1.0000 | INHALATION_SPRAY | Freq: Two times a day (BID) | RESPIRATORY_TRACT | Status: DC

## 2014-10-05 NOTE — Telephone Encounter (Signed)
Received a fax from the pt's pharmacy stating Ruthe Mannan is no longer covered by her insurance. Covered alternatives are Advair and Breo. Please advise, Thanks!

## 2014-10-05 NOTE — Telephone Encounter (Signed)
Pharmacy notified of rx change. Pharmacy to advise pt.

## 2014-10-05 NOTE — Telephone Encounter (Signed)
Ok, i have sent a prescription to your pharmacy, for advair

## 2014-10-15 ENCOUNTER — Encounter: Payer: Self-pay | Admitting: Endocrinology

## 2014-10-15 ENCOUNTER — Ambulatory Visit (INDEPENDENT_AMBULATORY_CARE_PROVIDER_SITE_OTHER): Payer: Managed Care, Other (non HMO) | Admitting: Endocrinology

## 2014-10-15 VITALS — BP 132/78 | HR 98 | Temp 98.0°F | Ht 67.5 in | Wt 230.0 lb

## 2014-10-15 DIAGNOSIS — E119 Type 2 diabetes mellitus without complications: Secondary | ICD-10-CM

## 2014-10-15 LAB — POCT GLYCOSYLATED HEMOGLOBIN (HGB A1C): Hemoglobin A1C: 5.9

## 2014-10-15 MED ORDER — BENZONATATE 100 MG PO CAPS: 100.0000 mg | ORAL_CAPSULE | Freq: Three times a day (TID) | ORAL | Status: DC | PRN

## 2014-10-15 MED ORDER — CEPHALEXIN 500 MG PO CAPS: 500.0000 mg | ORAL_CAPSULE | Freq: Three times a day (TID) | ORAL | Status: DC

## 2014-10-15 NOTE — Progress Notes (Signed)
Subjective:    Patient ID: Savannah [name], female    DOB: Jun 21, 1955, 59 y.o.   MRN: [id number]  HPI Pt states 4 days of moderate congestion in the sinuses, and assoc prod-quality cough.   Past Medical History  Diagnosis Date  . Unspecified hypothyroidism 01/07/2007  . 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 HYPERCHOLESTEROLEMIA 04/18/2009  . HYPOKALEMIA 01/15/2007  . ANEMIA, IRON DEFICIENCY 12/06/2008  . HYPERTENSION 09/27/2006  . ALLERGIC RHINITIS CAUSE UNSPECIFIED 12/29/2007  . ASTHMA 09/27/2006  . FATTY LIVER DISEASE 03/19/2008  . URINARY CALCULUS 11/08/2009  . 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 BACK PAIN, LUMBAR 12/29/2009  . Palpitations 05/23/2009  . ASYMPTOMATIC POSTMENOPAUSAL STATUS 03/19/2008  . Chronic kidney disease   . OBSTRUCTIVE SLEEP APNEA 08/09/2009  . Sleep apnea     wears a c-pap- settings at 9   . Depression     Past Surgical History  Procedure Laterality Date  . Knee arthroscopy Left 1993  . Cardiac catheterization  2011  . Compound fracture Right     arm  . Right foot surgery     . Robotic assited partial nephrectomy Right 05/19/2014    Procedure: ROBOTIC ASSITED PARTIAL NEPHRECTOMY;  Surgeon: Alexis Frock, MD;  Location: [facility];  Service: Urology;  Laterality: Right;    Social History   Social History  . Marital Status: Married    Spouse Name: N/A  . Number of Children: 2  . Years of Education: N/A   Occupational History  . Homemaker    Social History Main Topics  . Smoking status: Former Smoker -- 0.30 packs/day for 2 years    Types: Cigarettes    Quit date: 02/27/1975  . Smokeless tobacco: Never Used  . Alcohol Use: No  . Drug Use: No  . Sexual Activity: Not on file   Other Topics Concern  . 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 atorvastatin (LIPITOR) 40 MG tablet TAKE 1 TABLET (40 MG TOTAL) BY MOUTH  DAILY. 90 tablet 0  . citalopram (CELEXA) 20 MG tablet Take 1 tablet (20 mg total) by mouth daily. 30 tablet 3  . Fluticasone-Salmeterol (ADVAIR) 100-50 MCG/DOSE AEPB Inhale 1 puff into the lungs 2 (two) times daily. 1 each 11  . gabapentin (NEURONTIN) 300 MG capsule TAKE 1 CAPSULE (300 MG TOTAL) BY MOUTH AT BEDTIME. 30 capsule 5  . hyoscyamine (LEVSIN SL) 0.125 MG SL tablet Take 1-2 tablets by mouth or under tongue every 4 hours as needed. 100 tablet 5  . levothyroxine (SYNTHROID, LEVOTHROID) 125 MCG tablet Take 1 tablet by mouth every day before breakfast 30 tablet 2  . losartan-hydrochlorothiazide (HYZAAR) 100-12.5 MG per tablet TAKE 1 TABLET BY MOUTH DAILY 90 tablet 1  . metFORMIN (GLUCOPHAGE-XR) 500 MG 24 hr tablet TAKE 2 TABLETS BY MOUTH TWICE DAILY 360 tablet 1  . metoprolol succinate (TOPROL-XL) 25 MG 24 hr tablet TAKE 1 TABLET BY MOUTH EVERY DAY 90 tablet 0  . omeprazole (PRILOSEC) 40 MG capsule Take 1 capsule (40 mg total) by mouth daily. 30 capsule 11  . ondansetron (ZOFRAN) 4 MG tablet Take 1 tablet (4 mg total) by mouth every 6 (six) hours as needed for nausea. 30 tablet 1  . Oxycodone HCl 10 MG TABS Take 1-2 tablets (10-20 mg total) by mouth every 6 (six) hours as needed. For post-op  pain 60 each 0  . pioglitazone (ACTOS) 30 MG tablet Take 1 tablet (30 mg total) by mouth daily. 30 tablet 11  . PROAIR HFA 108 (90 BASE) MCG/ACT inhaler INHALE 2 PUFFS EVERY 4 HOURS AS NEEDED FOR WHEEZING, COUGH & SHORTNESS OF BREATH 8.5 Inhaler 0  . senna-docusate (SENOKOT-S) 8.6-50 MG per tablet Take 1 tablet by mouth 2 (two) times daily. While taking pain meds to prevent constipation 30 tablet 0  . traZODone (DESYREL) 100 MG tablet Take 1 tablet (100 mg total) by mouth at bedtime. 30 tablet 11   No current facility-administered medications on file prior to visit.    Allergies  Allergen Reactions  . Erythromycin Nausea And Vomiting    REACTION: nausea/vomiting    Family History  Problem  Relation Age of Onset  . Colon cancer Mother 6  . Diabetes Mother   . Kidney disease Mother   . Heart attack Father   . Heart disease Father   . Allergies Sister   . Asthma Sister   . Clotting disorder Grandchild   . Heart disease Paternal Grandmother   . Esophageal cancer Neg Hx   . Rectal cancer Neg Hx   . Stomach cancer Neg Hx     BP 132/78 mmHg  Pulse 98  Temp(Src) 98 F (36.7 C) (Oral)  Ht 5' 7.5" (1.715 m)  Wt 230 lb (104.327 kg)  BMI 35.47 kg/m2  SpO2 95%  Review of Systems Denies fever, but she has slight bilat earache.      Objective:   Physical Exam VITAL SIGNS:  See vs page GENERAL: no distress head: no deformity eyes: no periorbital swelling, no proptosis external nose and ears are normal mouth: no lesion seen Both tm's are red Pulses: dorsalis pedis intact bilat.   MSK: no deformity of the feet CV: no leg edema Skin:  no ulcer on the feet.  normal color and temp on the feet. Neuro: sensation is intact to touch on the feet.    A1c=5.9%    Assessment & Plan:  URI: new DM: well-controlled  Patient is advised the following: Patient Instructions  i have sent 2 prescriptions to your pharmacy, for an antibiotic and cough. Loratadine-d (non-prescription) will help your congestion.  Please continue the same medications for diabetes.  Please come back for a regular physical appointment in 3 months

## 2014-10-15 NOTE — Patient Instructions (Addendum)
i have sent 2 prescriptions to your pharmacy, for an antibiotic and cough. Loratadine-d (non-prescription) will help your congestion.  Please continue the same medications for diabetes.  Please come back for a regular physical appointment in 3 months

## 2014-10-18 ENCOUNTER — Encounter: Payer: Self-pay | Admitting: Gastroenterology

## 2014-10-19 ENCOUNTER — Other Ambulatory Visit: Payer: Self-pay | Admitting: Endocrinology

## 2014-11-01 ENCOUNTER — Other Ambulatory Visit: Payer: Self-pay | Admitting: Endocrinology

## 2014-11-06 ENCOUNTER — Other Ambulatory Visit: Payer: Self-pay | Admitting: Endocrinology

## 2014-11-12 ENCOUNTER — Other Ambulatory Visit: Payer: Self-pay | Admitting: Endocrinology

## 2014-12-02 ENCOUNTER — Other Ambulatory Visit: Payer: Self-pay | Admitting: Endocrinology

## 2014-12-03 ENCOUNTER — Other Ambulatory Visit: Payer: Self-pay | Admitting: Endocrinology

## 2015-01-12 ENCOUNTER — Other Ambulatory Visit: Payer: Self-pay | Admitting: Endocrinology

## 2015-01-14 ENCOUNTER — Other Ambulatory Visit: Payer: Self-pay

## 2015-01-14 ENCOUNTER — Ambulatory Visit (INDEPENDENT_AMBULATORY_CARE_PROVIDER_SITE_OTHER): Payer: Managed Care, Other (non HMO) | Admitting: Endocrinology

## 2015-01-14 ENCOUNTER — Encounter: Payer: Self-pay | Admitting: Endocrinology

## 2015-01-14 VITALS — BP 134/86 | HR 79 | Temp 98.4°F | Ht 67.5 in | Wt 236.0 lb

## 2015-01-14 DIAGNOSIS — K7581 Nonalcoholic steatohepatitis (NASH): Secondary | ICD-10-CM | POA: Diagnosis not present

## 2015-01-14 DIAGNOSIS — D509 Iron deficiency anemia, unspecified: Secondary | ICD-10-CM | POA: Diagnosis not present

## 2015-01-14 DIAGNOSIS — E559 Vitamin D deficiency, unspecified: Secondary | ICD-10-CM

## 2015-01-14 DIAGNOSIS — E119 Type 2 diabetes mellitus without complications: Secondary | ICD-10-CM

## 2015-01-14 DIAGNOSIS — I517 Cardiomegaly: Secondary | ICD-10-CM | POA: Insufficient documentation

## 2015-01-14 DIAGNOSIS — I10 Essential (primary) hypertension: Secondary | ICD-10-CM

## 2015-01-14 DIAGNOSIS — K253 Acute gastric ulcer without hemorrhage or perforation: Secondary | ICD-10-CM

## 2015-01-14 DIAGNOSIS — E039 Hypothyroidism, unspecified: Secondary | ICD-10-CM

## 2015-01-14 DIAGNOSIS — Z0000 Encounter for general adult medical examination without abnormal findings: Secondary | ICD-10-CM

## 2015-01-14 DIAGNOSIS — N289 Disorder of kidney and ureter, unspecified: Secondary | ICD-10-CM

## 2015-01-14 DIAGNOSIS — K2970 Gastritis, unspecified, without bleeding: Secondary | ICD-10-CM

## 2015-01-14 LAB — MICROALBUMIN / CREATININE URINE RATIO
CREATININE, U: 119.6 mg/dL
MICROALB UR: 3 mg/dL — AB (ref 0.0–1.9)
Microalb Creat Ratio: 2.5 mg/g (ref 0.0–30.0)

## 2015-01-14 LAB — BASIC METABOLIC PANEL
BUN: 16 mg/dL (ref 6–23)
CALCIUM: 9.5 mg/dL (ref 8.4–10.5)
CO2: 31 mEq/L (ref 19–32)
CREATININE: 1.14 mg/dL (ref 0.40–1.20)
Chloride: 102 mEq/L (ref 96–112)
GFR: 51.6 mL/min — AB (ref >60.00)
GLUCOSE: 97 mg/dL (ref 70–99)
POTASSIUM: 3.9 meq/L (ref 3.5–5.1)
Sodium: 140 mEq/L (ref 135–145)

## 2015-01-14 LAB — CBC WITH DIFFERENTIAL/PLATELET
BASOS ABS: 0 10*3/uL (ref 0.0–0.1)
Basophils Relative: 0.3 % (ref 0.0–3.0)
EOS PCT: 4.4 % (ref 0.0–5.0)
Eosinophils Absolute: 0.3 10*3/uL (ref 0.0–0.7)
HEMATOCRIT: 36.9 % (ref 36.0–46.0)
HEMOGLOBIN: 12.2 g/dL (ref 12.0–15.0)
LYMPHS PCT: 27.3 % (ref 12.0–46.0)
Lymphs Abs: 1.9 10*3/uL (ref 0.7–4.0)
MCHC: 33 g/dL (ref 30.0–36.0)
MCV: 88.9 fl (ref 78.0–100.0)
MONOS PCT: 5 % (ref 3.0–12.0)
Monocytes Absolute: 0.4 10*3/uL (ref 0.1–1.0)
Neutro Abs: 4.5 10*3/uL (ref 1.4–7.7)
Neutrophils Relative %: 63 % (ref 43.0–77.0)
Platelets: 194 10*3/uL (ref 150.0–400.0)
RBC: 4.15 Mil/uL (ref 3.87–5.11)
RDW: 14.9 % (ref 11.5–15.5)
WBC: 7.1 10*3/uL (ref 4.0–10.5)

## 2015-01-14 LAB — LIPID PANEL
CHOL/HDL RATIO: 3
Cholesterol: 165 mg/dL (ref 0–200)
HDL: 52 mg/dL (ref >39.00)
NONHDL: 112.8
Triglycerides: 210 mg/dL — ABNORMAL HIGH (ref 0.0–149.0)
VLDL: 42 mg/dL — AB (ref 0.0–40.0)

## 2015-01-14 LAB — IBC PANEL
Iron: 97 ug/dL (ref 42–145)
SATURATION RATIOS: 22.4 % (ref 20.0–50.0)
TRANSFERRIN: 309 mg/dL (ref 212.0–360.0)

## 2015-01-14 LAB — HEPATIC FUNCTION PANEL
ALK PHOS: 59 U/L (ref 39–117)
ALT: 20 U/L (ref 0–35)
AST: 23 U/L (ref 0–37)
Albumin: 4.3 g/dL (ref 3.5–5.2)
BILIRUBIN DIRECT: 0.2 mg/dL (ref 0.0–0.3)
BILIRUBIN TOTAL: 1.3 mg/dL — AB (ref 0.2–1.2)
TOTAL PROTEIN: 7.2 g/dL (ref 6.0–8.3)

## 2015-01-14 LAB — LDL CHOLESTEROL, DIRECT: Direct LDL: 88 mg/dL

## 2015-01-14 LAB — POCT GLYCOSYLATED HEMOGLOBIN (HGB A1C): HEMOGLOBIN A1C: 6.2

## 2015-01-14 LAB — TSH: TSH: 0.99 u[IU]/mL (ref 0.35–4.50)

## 2015-01-14 MED ORDER — FLUTICASONE-SALMETEROL 100-50 MCG/DOSE IN AEPB: 1.0000 | INHALATION_SPRAY | Freq: Two times a day (BID) | RESPIRATORY_TRACT | Status: DC

## 2015-01-14 MED ORDER — METFORMIN HCL ER 500 MG PO TB24: 1000.0000 mg | ORAL_TABLET | Freq: Two times a day (BID) | ORAL | Status: DC

## 2015-01-14 MED ORDER — LEVOTHYROXINE SODIUM 125 MCG PO TABS: ORAL_TABLET | ORAL | Status: DC

## 2015-01-14 MED ORDER — TRAZODONE HCL 100 MG PO TABS: 100.0000 mg | ORAL_TABLET | Freq: Every day | ORAL | Status: DC

## 2015-01-14 MED ORDER — GABAPENTIN 300 MG PO CAPS: ORAL_CAPSULE | ORAL | Status: DC

## 2015-01-14 MED ORDER — OXYCODONE HCL 10 MG PO TABS: 10.0000 mg | ORAL_TABLET | Freq: Four times a day (QID) | ORAL | Status: DC | PRN

## 2015-01-14 MED ORDER — ONDANSETRON HCL 4 MG PO TABS
4.0000 mg | ORAL_TABLET | Freq: Four times a day (QID) | ORAL | Status: DC | PRN
Start: 2015-01-14 — End: 2017-04-17

## 2015-01-14 MED ORDER — OMEPRAZOLE 40 MG PO CPDR: 40.0000 mg | DELAYED_RELEASE_CAPSULE | Freq: Every day | ORAL | Status: DC

## 2015-01-14 MED ORDER — METOPROLOL SUCCINATE ER 25 MG PO TB24: 25.0000 mg | ORAL_TABLET | Freq: Every day | ORAL | Status: DC

## 2015-01-14 MED ORDER — ATORVASTATIN CALCIUM 40 MG PO TABS: ORAL_TABLET | ORAL | Status: DC

## 2015-01-14 MED ORDER — HYOSCYAMINE SULFATE 0.125 MG SL SUBL: SUBLINGUAL_TABLET | SUBLINGUAL | Status: DC

## 2015-01-14 MED ORDER — LOSARTAN POTASSIUM-HCTZ 100-12.5 MG PO TABS: 1.0000 | ORAL_TABLET | Freq: Every day | ORAL | Status: DC

## 2015-01-14 MED ORDER — PIOGLITAZONE HCL 30 MG PO TABS: ORAL_TABLET | ORAL | Status: DC

## 2015-01-14 MED ORDER — ALBUTEROL SULFATE HFA 108 (90 BASE) MCG/ACT IN AERS: INHALATION_SPRAY | RESPIRATORY_TRACT | Status: DC

## 2015-01-14 NOTE — Progress Notes (Signed)
Subjective:    Patient ID: Savannah [name], female    DOB: [birth year], 59 y.o.   MRN: [id number]  HPI The state of at least three ongoing medical problems is addressed today, with interval history of each noted here:  GERD: prilosec works well.   ASCVD: denies chest pain.  Low-back pain: is well-controlled.  Past Medical History  Diagnosis Date  . Unspecified hypothyroidism 01/07/2007  . 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 HYPERCHOLESTEROLEMIA 04/18/2009  . HYPOKALEMIA 01/15/2007  . ANEMIA, IRON DEFICIENCY 12/06/2008  . HYPERTENSION 09/27/2006  . ALLERGIC RHINITIS CAUSE UNSPECIFIED 12/29/2007  . ASTHMA 09/27/2006  . FATTY LIVER DISEASE 03/19/2008  . URINARY CALCULUS 11/08/2009  . 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 BACK PAIN, LUMBAR 12/29/2009  . Palpitations 05/23/2009  . ASYMPTOMATIC POSTMENOPAUSAL STATUS 03/19/2008  . Chronic kidney disease   . OBSTRUCTIVE SLEEP APNEA 08/09/2009  . Sleep apnea     wears a c-pap- settings at 9   . Depression     Past Surgical History  Procedure Laterality Date  . Knee arthroscopy Left 1993  . Cardiac catheterization  2011  . Compound fracture Right     arm  . Right foot surgery     . Robotic assited partial nephrectomy Right 05/19/2014    Procedure: ROBOTIC ASSITED PARTIAL NEPHRECTOMY;  Surgeon: Alexis Frock, MD;  Location: [facility];  Service: Urology;  Laterality: Right;    Social History   Social History  . Marital Status: Married    Spouse Name: N/A  . Number of Children: 2  . Years of Education: N/A   Occupational History  . Homemaker    Social History Main Topics  . Smoking status: Former Smoker -- 0.30 packs/day for 2 years    Types: Cigarettes    Quit date: 02/27/1975  . Smokeless tobacco: Never Used  . Alcohol Use: No  . Drug Use: No  . Sexual Activity: Not on file   Other Topics Concern  . 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   No current facility-administered medications on file prior to visit.    Allergies  Allergen Reactions  . Erythromycin Nausea And Vomiting    REACTION: nausea/vomiting    Family History  Problem Relation Age of Onset  . Colon cancer Mother 6  . Diabetes Mother   . Kidney disease Mother   . Heart attack Father   . Heart disease Father   . Allergies Sister   . Asthma Sister   . Clotting disorder Grandchild   . Heart disease Paternal Grandmother   . Esophageal cancer Neg Hx   . Rectal cancer Neg Hx   . Stomach cancer Neg Hx     BP 134/86 mmHg  Pulse 79  Temp(Src) 98.4 F (36.9 C) (Oral)  Ht 5' 7.5" (1.715 m)  Wt 236 lb (107.049 kg)  BMI 36.40 kg/m2  SpO2 96%  Review of Systems Denies sob.  No weight change    Objective:   Physical Exam VITAL SIGNS:  See vs page GENERAL: no distress LUNGS:  Clear to auscultation HEART:  Regular rate and rhythm without murmurs noted. Normal S1,S2.   Pulses: dorsalis pedis intact bilat.   MSK: no deformity of the feet CV: no leg edema Skin:  no ulcer on the feet.  normal color and temp on the feet. Neuro: sensation is intact to touch  on the feet.     A1c=6.2%  CT (done at urology): Thickened esophagus Cardiomegaly Spinal OA ascvd of aorta Bibasilar scarring     Assessment & Plan:  Thickened esophagus, due to esophagitis.   pulm scarring: new, uncertain etiology.  Cardiomegaly, incidentally noted, uncertain etiology.  Low-back pain, due to OA.  ASCVD: rx of this is rx of risk factors.   DM: well-controlled.   Patient is advised the following: Patient Instructions  Please continue the same prilosec. The treatment for the hardening of the arteries is keeping you blood pressure, cholesterol, and sugar good. Please continue the same medications for diabetes and blood pressure. blood tests are requested for you today.  We'll let you know about the results. Here is a refill of the pain medication.    Let's check an "echocardiogram." you will receive a phone call, about a day and time for an appointment. Please come back for a regular physical appointment in 3 months.

## 2015-01-14 NOTE — Patient Instructions (Addendum)
Please continue the same prilosec. The treatment for the hardening of the arteries is keeping you blood pressure, cholesterol, and sugar good. Please continue the same medications for diabetes and blood pressure. blood tests are requested for you today.  We'll let you know about the results. Here is a refill of the pain medication.  Let's check an "echocardiogram." you will receive a phone call, about a day and time for an appointment. Please come back for a regular physical appointment in 3 months.

## 2015-01-17 ENCOUNTER — Other Ambulatory Visit: Payer: Self-pay

## 2015-01-17 ENCOUNTER — Telehealth (HOSPITAL_COMMUNITY): Payer: Self-pay | Admitting: *Deleted

## 2015-01-17 LAB — VITAMIN D 25 HYDROXY (VIT D DEFICIENCY, FRACTURES): VITD: 25.68 ng/mL — ABNORMAL LOW (ref 30.00–100.00)

## 2015-01-17 MED ORDER — ATORVASTATIN CALCIUM 40 MG PO TABS: ORAL_TABLET | ORAL | Status: DC

## 2015-01-17 MED ORDER — GABAPENTIN 300 MG PO CAPS: ORAL_CAPSULE | ORAL | Status: DC

## 2015-01-17 MED ORDER — PIOGLITAZONE HCL 30 MG PO TABS
ORAL_TABLET | ORAL | Status: DC
Start: 2015-01-17 — End: 2015-10-13

## 2015-01-17 MED ORDER — LOSARTAN POTASSIUM-HCTZ 100-12.5 MG PO TABS: 1.0000 | ORAL_TABLET | Freq: Every day | ORAL | Status: DC

## 2015-01-17 MED ORDER — METOPROLOL SUCCINATE ER 25 MG PO TB24: 25.0000 mg | ORAL_TABLET | Freq: Every day | ORAL | Status: DC

## 2015-01-17 MED ORDER — LEVOTHYROXINE SODIUM 125 MCG PO TABS: ORAL_TABLET | ORAL | Status: DC

## 2015-01-17 MED ORDER — METFORMIN HCL ER 500 MG PO TB24: 1000.0000 mg | ORAL_TABLET | Freq: Two times a day (BID) | ORAL | Status: DC

## 2015-01-17 MED ORDER — TRAZODONE HCL 100 MG PO TABS: 100.0000 mg | ORAL_TABLET | Freq: Every day | ORAL | Status: DC

## 2015-02-18 ENCOUNTER — Ambulatory Visit (INDEPENDENT_AMBULATORY_CARE_PROVIDER_SITE_OTHER): Payer: Managed Care, Other (non HMO) | Admitting: Emergency Medicine

## 2015-02-18 VITALS — BP 122/80 | HR 67 | Temp 98.2°F | Resp 16 | Ht 69.0 in | Wt 240.0 lb

## 2015-02-18 DIAGNOSIS — R05 Cough: Secondary | ICD-10-CM

## 2015-02-18 DIAGNOSIS — J209 Acute bronchitis, unspecified: Secondary | ICD-10-CM | POA: Diagnosis not present

## 2015-02-18 DIAGNOSIS — J0140 Acute pansinusitis, unspecified: Secondary | ICD-10-CM | POA: Diagnosis not present

## 2015-02-18 LAB — POCT INFLUENZA A/B
INFLUENZA B, POC: NEGATIVE
Influenza A, POC: NEGATIVE

## 2015-02-18 MED ORDER — PSEUDOEPHEDRINE-GUAIFENESIN ER 60-600 MG PO TB12: 1.0000 | ORAL_TABLET | Freq: Two times a day (BID) | ORAL | Status: DC

## 2015-02-18 MED ORDER — AMOXICILLIN-POT CLAVULANATE 875-125 MG PO TABS: 1.0000 | ORAL_TABLET | Freq: Two times a day (BID) | ORAL | Status: DC

## 2015-02-18 MED ORDER — HYDROCOD POLST-CPM POLST ER 10-8 MG/5ML PO SUER: 5.0000 mL | Freq: Two times a day (BID) | ORAL | Status: DC

## 2015-02-18 NOTE — Patient Instructions (Signed)

## 2015-02-18 NOTE — Progress Notes (Signed)
Subjective:  Patient ID: Savannah [name], female    DOB: 1955-11-16  Age: 59 y.o. MRN: [id number]  CC: Cough; Sore Throat; and Nasal Congestion    HPI Savannah [name] presents   With a history of nasal congestion nasal discharge and postnasal drainage or nasal discharge  Has been purulent. Sore throat and pressure in her cheeks and forehead. She has a cough productive of mucopurulent sputum. She has no wheezing or shortness of breath. No fever or chills. No nausea or vomiting. No stool change. No improvement with over-the-counter medication.  History Savannah [name] has a past medical history of Unspecified hypothyroidism (01/07/2007); AODM (04/18/2007); HYPERCHOLESTEROLEMIA (04/18/2009); HYPOKALEMIA (01/15/2007); ANEMIA, IRON DEFICIENCY (12/06/2008); HYPERTENSION (09/27/2006); ALLERGIC RHINITIS CAUSE UNSPECIFIED (12/29/2007); ASTHMA (09/27/2006); FATTY LIVER DISEASE (03/19/2008); URINARY CALCULUS (11/08/2009); OSTEOARTHRITIS, LUMBAR SPINE (04/18/2009); BACK PAIN, LUMBAR (12/29/2009); Palpitations (05/23/2009); ASYMPTOMATIC POSTMENOPAUSAL STATUS (03/19/2008); Chronic kidney disease; OBSTRUCTIVE SLEEP APNEA (08/09/2009); Sleep apnea; and Depression.   She has past surgical history that includes Knee arthroscopy (Left, 1993); Cardiac catheterization (2011); compound fracture (Right); right foot surgery ; and Robotic assited partial nephrectomy (Right, 05/19/2014).   Her  family history includes Allergies in her sister; Asthma in her sister; Clotting disorder in her grandchild; Colon cancer (age of onset: 77) in her mother; Diabetes in her mother; Heart attack in her father; Heart disease in her father and paternal grandmother; Kidney disease in her mother. There is no history of Esophageal cancer, Rectal cancer, or Stomach cancer.  She   reports that she quit smoking about 40 years ago. Her smoking use included Cigarettes. She has a .6 pack-year smoking history. She has never used smokeless tobacco. She reports  that she does not drink alcohol or use illicit drugs.  Outpatient Prescriptions Prior to Visit  Medication Sig Dispense Refill  . albuterol (PROAIR HFA) 108 (90 BASE) MCG/ACT inhaler INHALE 2 PUFFS EVERY 4 HOURS AS NEEDED FOR WHEEZING, COUGH & SHORTNESS OF BREATH 8.5 Inhaler 11  . atorvastatin (LIPITOR) 40 MG tablet TAKE 1 TABLET (40 MG TOTAL) BY MOUTH DAILY. 90 tablet 3  . Fluticasone-Salmeterol (ADVAIR) 100-50 MCG/DOSE AEPB Inhale 1 puff into the lungs 2 (two) times daily. 1 each 11  . gabapentin (NEURONTIN) 300 MG capsule TAKE 1 CAPSULE (300 MG TOTAL) BY MOUTH AT BEDTIME. 90 capsule 3  . hyoscyamine (LEVSIN SL) 0.125 MG SL tablet Take 1-2 tablets by mouth or under tongue every 4 hours as needed. 100 tablet 5  . levothyroxine (SYNTHROID, LEVOTHROID) 125 MCG tablet TAKE 1 TABLET BY MOUTH EVERY DAY BEFORE BREAKFAST 90 tablet 3  . losartan-hydrochlorothiazide (HYZAAR) 100-12.5 MG tablet Take 1 tablet by mouth daily. 90 tablet 3  . metFORMIN (GLUCOPHAGE-XR) 500 MG 24 hr tablet Take 2 tablets (1,000 mg total) by mouth 2 (two) times daily. 360 tablet 3  . metoprolol succinate (TOPROL-XL) 25 MG 24 hr tablet Take 1 tablet (25 mg total) by mouth daily. 90 tablet 3  . omeprazole (PRILOSEC) 40 MG capsule Take 1 capsule (40 mg total) by mouth daily. 90 capsule 3  . ondansetron (ZOFRAN) 4 MG tablet Take 1 tablet (4 mg total) by mouth every 6 (six) hours as needed for nausea. 30 tablet 11  . Oxycodone HCl 10 MG TABS Take 1-2 tablets (10-20 mg total) by mouth every 6 (six) hours as needed. For post-op pain 60 each 0  . pioglitazone (ACTOS) 30 MG tablet TAKE 1 TABLET (30 MG TOTAL) BY MOUTH DAILY. 90 tablet 3  . traZODone (DESYREL) 100 MG tablet  Take 1 tablet (100 mg total) by mouth at bedtime. 90 tablet 3   No facility-administered medications prior to visit.    Social History   Social History  . Marital Status: Married    Spouse Name: N/A  . Number of Children: 2  . Years of Education: N/A    Occupational History  . Homemaker    Social History Main Topics  . Smoking status: Former Smoker -- 0.30 packs/day for 2 years    Types: Cigarettes    Quit date: 02/27/1975  . Smokeless tobacco: Never Used  . Alcohol Use: No  . Drug Use: No  . Sexual Activity: Not Asked   Other Topics Concern  . 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  HENT: Positive for congestion, postnasal drip, rhinorrhea, sinus pressure and sore throat. Negative for ear pain.   Eyes: Negative for pain and redness.  Respiratory: Positive for cough. Negative for shortness of breath and wheezing.   Cardiovascular: Negative for leg swelling.  Gastrointestinal: Negative for nausea, vomiting, abdominal pain, diarrhea, constipation and blood in stool.  Endocrine: Negative for polyuria.  Genitourinary: Negative for dysuria, urgency, frequency and flank pain.  Musculoskeletal: Negative for gait problem.  Skin: Negative for rash.  Neurological: Negative for weakness and headaches.  Psychiatric/Behavioral: Negative for confusion and decreased concentration. The patient is not nervous/anxious.     Objective:  BP 122/80 mmHg  Pulse 67  Temp(Src) 98.2 F (36.8 C) (Oral)  Resp 16  Ht 5\' 9"  (1.753 m)  Wt 240 lb (108.863 kg)  BMI 35.43 kg/m2  SpO2 96%  Physical Exam  Constitutional: She is oriented to person, place, and time. She appears well-developed and well-nourished. No distress.  HENT:  Head: Normocephalic and atraumatic.  Right Ear: External ear normal.  Left Ear: External ear normal.  Nose: Nose normal.  Eyes: Conjunctivae and EOM are normal. Pupils are equal, round, and reactive to light. No scleral icterus.  Neck: Normal range of motion. Neck supple. No tracheal deviation present.  Cardiovascular: Normal rate, regular rhythm and normal heart sounds.   Pulmonary/Chest:  Effort normal. No respiratory distress. She has no wheezes. She has no rales.  Abdominal: She exhibits no mass. There is no tenderness. There is no rebound and no guarding.  Musculoskeletal: She exhibits no edema.  Lymphadenopathy:    She has no cervical adenopathy.  Neurological: She is alert and oriented to person, place, and time. Coordination normal.  Skin: Skin is warm and dry. No rash noted.  Psychiatric: She has a normal mood and affect. Her behavior is normal.      Assessment & Plan:   Savannah [name] was seen today for cough, sore throat and nasal congestion.  Diagnoses and all orders for this visit:  Acute bronchitis, unspecified organism -     POCT Influenza A/B  Acute pansinusitis, recurrence not specified  Other orders -     amoxicillin-clavulanate (AUGMENTIN) 875-125 MG tablet; Take 1 tablet by mouth 2 (two) times daily. -     pseudoephedrine-guaifenesin (MUCINEX D) 60-600 MG 12 hr tablet; Take 1 tablet by mouth every 12 (twelve) hours. -     chlorpheniramine-HYDROcodone (TUSSIONEX PENNKINETIC ER) 10-8 MG/5ML SUER; Take 5 mLs by mouth 2 (two) times daily.  I am having Savannah [name] start on amoxicillin-clavulanate, pseudoephedrine-guaifenesin, and chlorpheniramine-HYDROcodone. I am also having her maintain her Oxycodone  HCl, omeprazole, Fluticasone-Salmeterol, hyoscyamine, ondansetron, albuterol, atorvastatin, gabapentin, levothyroxine, losartan-hydrochlorothiazide, metFORMIN, metoprolol succinate, pioglitazone, and traZODone.  Meds ordered this encounter  Medications  . amoxicillin-clavulanate (AUGMENTIN) 875-125 MG tablet    Sig: Take 1 tablet by mouth 2 (two) times daily.    Dispense:  20 tablet    Refill:  0  . pseudoephedrine-guaifenesin (MUCINEX D) 60-600 MG 12 hr tablet    Sig: Take 1 tablet by mouth every 12 (twelve) hours.    Dispense:  18 tablet    Refill:  0  . chlorpheniramine-HYDROcodone (TUSSIONEX PENNKINETIC ER) 10-8 MG/5ML SUER    Sig: Take 5 mLs by mouth  2 (two) times daily.    Dispense:  60 mL    Refill:  0    Appropriate red flag conditions were discussed with the patient as well as actions that should be taken.  Patient expressed his understanding.  Follow-up: Return if symptoms worsen or fail to improve.  Roselee Culver, MD   Results for orders placed or performed in visit on 02/18/15  POCT Influenza A/B  Result Value Ref Range   Influenza A, POC Negative Negative   Influenza B, POC Negative Negative

## 2015-03-06 ENCOUNTER — Other Ambulatory Visit: Payer: Self-pay | Admitting: Endocrinology

## 2015-03-06 ENCOUNTER — Other Ambulatory Visit: Payer: Self-pay | Admitting: Gastroenterology

## 2015-03-07 NOTE — Telephone Encounter (Signed)
Please advise if we can refill. Medication is not on current med list.

## 2015-03-09 ENCOUNTER — Other Ambulatory Visit: Payer: Self-pay | Admitting: Endocrinology

## 2015-03-16 ENCOUNTER — Other Ambulatory Visit: Payer: Self-pay | Admitting: Podiatry

## 2015-03-16 ENCOUNTER — Other Ambulatory Visit: Payer: Self-pay | Admitting: Endocrinology

## 2015-04-15 ENCOUNTER — Ambulatory Visit (INDEPENDENT_AMBULATORY_CARE_PROVIDER_SITE_OTHER): Payer: Managed Care, Other (non HMO) | Admitting: Endocrinology

## 2015-04-15 ENCOUNTER — Other Ambulatory Visit: Payer: Self-pay

## 2015-04-15 ENCOUNTER — Encounter: Payer: Self-pay | Admitting: Endocrinology

## 2015-04-15 VITALS — BP 136/86 | HR 75 | Temp 98.2°F | Ht 69.0 in | Wt 240.0 lb

## 2015-04-15 DIAGNOSIS — Z23 Encounter for immunization: Secondary | ICD-10-CM

## 2015-04-15 DIAGNOSIS — K253 Acute gastric ulcer without hemorrhage or perforation: Secondary | ICD-10-CM

## 2015-04-15 DIAGNOSIS — E119 Type 2 diabetes mellitus without complications: Secondary | ICD-10-CM | POA: Diagnosis not present

## 2015-04-15 DIAGNOSIS — Z780 Asymptomatic menopausal state: Secondary | ICD-10-CM

## 2015-04-15 DIAGNOSIS — K259 Gastric ulcer, unspecified as acute or chronic, without hemorrhage or perforation: Secondary | ICD-10-CM | POA: Diagnosis not present

## 2015-04-15 DIAGNOSIS — K2970 Gastritis, unspecified, without bleeding: Secondary | ICD-10-CM | POA: Diagnosis not present

## 2015-04-15 LAB — POCT GLYCOSYLATED HEMOGLOBIN (HGB A1C): Hemoglobin A1C: 5.7

## 2015-04-15 MED ORDER — OMEPRAZOLE 40 MG PO CPDR: 40.0000 mg | DELAYED_RELEASE_CAPSULE | Freq: Every day | ORAL | Status: DC

## 2015-04-15 MED ORDER — CITALOPRAM HYDROBROMIDE 20 MG PO TABS: 20.0000 mg | ORAL_TABLET | Freq: Every day | ORAL | Status: DC

## 2015-04-15 NOTE — Progress Notes (Signed)
we discussed code status.  pt requests full code, but would not want to be started or maintained on artificial life-support measures if there was not a reasonable chance of recovery 

## 2015-04-15 NOTE — Progress Notes (Signed)
Subjective:    Patient ID: Savannah [name], female    DOB: [birth year], 60 y.o.   MRN: [id number]  HPI Pt is here for regular wellness examination, and is feeling pretty well in general, and says chronic med probs are stable, except as noted below Past Medical History  Diagnosis Date  . Unspecified hypothyroidism 01/07/2007  . 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 HYPERCHOLESTEROLEMIA 04/18/2009  . HYPOKALEMIA 01/15/2007  . ANEMIA, IRON DEFICIENCY 12/06/2008  . HYPERTENSION 09/27/2006  . ALLERGIC RHINITIS CAUSE UNSPECIFIED 12/29/2007  . ASTHMA 09/27/2006  . FATTY LIVER DISEASE 03/19/2008  . URINARY CALCULUS 11/08/2009  . 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 BACK PAIN, LUMBAR 12/29/2009  . Palpitations 05/23/2009  . ASYMPTOMATIC POSTMENOPAUSAL STATUS 03/19/2008  . Chronic kidney disease   . OBSTRUCTIVE SLEEP APNEA 08/09/2009  . Sleep apnea     wears a c-pap- settings at 9   . Depression     Past Surgical History  Procedure Laterality Date  . Knee arthroscopy Left 1993  . Cardiac catheterization  2011  . Compound fracture Right     arm  . Right foot surgery     . Robotic assited partial nephrectomy Right 05/19/2014    Procedure: ROBOTIC ASSITED PARTIAL NEPHRECTOMY;  Surgeon: Alexis Frock, MD;  Location: [facility];  Service: Urology;  Laterality: Right;    Social History   Social History  . Marital Status: Married    Spouse Name: N/A  . Number of Children: 2  . Years of Education: N/A   Occupational History  . Homemaker    Social History Main Topics  . Smoking status: Former Smoker -- 0.30 packs/day for 2 years    Types: Cigarettes    Quit date: 02/27/1975  . Smokeless tobacco: Never Used  . Alcohol Use: No  . Drug Use: No  . Sexual Activity: Not on file   Other Topics Concern  . 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 albuterol (PROAIR  HFA) 108 (90 BASE) MCG/ACT inhaler INHALE 2 PUFFS EVERY 4 HOURS AS NEEDED FOR WHEEZING, COUGH & SHORTNESS OF BREATH 8.5 Inhaler 11  . atorvastatin (LIPITOR) 40 MG tablet TAKE 1 TABLET (40 MG TOTAL) BY MOUTH DAILY. 90 tablet 0  . Fluticasone-Salmeterol (ADVAIR) 100-50 MCG/DOSE AEPB Inhale 1 puff into the lungs 2 (two) times daily. 1 each 11  . gabapentin (NEURONTIN) 300 MG capsule TAKE 1 CAPSULE (300 MG TOTAL) BY MOUTH AT BEDTIME. 90 capsule 3  . gabapentin (NEURONTIN) 300 MG capsule TAKE 1 CAPSULE BY MOUTH AT BEDTIME. 30 capsule 5  . hyoscyamine (LEVSIN SL) 0.125 MG SL tablet Take 1-2 tablets by mouth or under tongue every 4 hours as needed. 100 tablet 5  . levothyroxine (SYNTHROID, LEVOTHROID) 125 MCG tablet TAKE 1 TABLET BY MOUTH EVERY DAY BEFORE BREAKFAST 90 tablet 3  . losartan-hydrochlorothiazide (HYZAAR) 100-12.5 MG tablet Take 1 tablet by mouth daily. 90 tablet 3  . metFORMIN (GLUCOPHAGE-XR) 500 MG 24 hr tablet Take 2 tablets (1,000 mg total) by mouth 2 (two) times daily. 360 tablet 3  . metoprolol succinate (TOPROL-XL) 25 MG 24 hr tablet Take 1 tablet (25 mg total) by mouth daily. 90 tablet 3  . ondansetron (ZOFRAN) 4 MG tablet Take 1 tablet (4 mg total) by mouth every 6 (six) hours as needed for nausea. 30 tablet 11  .  Oxycodone HCl 10 MG TABS Take 1-2 tablets (10-20 mg total) by mouth every 6 (six) hours as needed. For post-op pain 60 each 0  . pioglitazone (ACTOS) 30 MG tablet TAKE 1 TABLET (30 MG TOTAL) BY MOUTH DAILY. 90 tablet 3  . traZODone (DESYREL) 100 MG tablet Take 1 tablet (100 mg total) by mouth at bedtime. 90 tablet 3   No current facility-administered medications on file prior to visit.    Allergies  Allergen Reactions  . Erythromycin Nausea And Vomiting    REACTION: nausea/vomiting    Family History  Problem Relation Age of Onset  . Colon cancer Mother 44  . Diabetes Mother   . Kidney disease Mother   . Heart attack Father   . Heart disease Father   . Allergies  Sister   . Asthma Sister   . Clotting disorder Grandchild   . Heart disease Paternal Grandmother   . Esophageal cancer Neg Hx   . Rectal cancer Neg Hx   . Stomach cancer Neg Hx     BP 136/86 mmHg  Pulse 75  Temp(Src) 98.2 F (36.8 C) (Oral)  Ht 5\' 9"  (1.753 m)  Wt 240 lb (108.863 kg)  BMI 35.43 kg/m2  SpO2 98%   Review of Systems  Constitutional: Negative for fever.  HENT: Negative for hearing loss.   Eyes: Negative for visual disturbance.  Respiratory: Negative for shortness of breath.   Cardiovascular: Negative for chest pain.  Gastrointestinal: Negative for anal bleeding.  Endocrine: Negative for cold intolerance.  Genitourinary: Negative for hematuria.  Musculoskeletal: Positive for back pain.  Skin: Negative for rash.  Allergic/Immunologic: Negative for environmental allergies.  Neurological: Negative for syncope.  Hematological: Bruises/bleeds easily.  Psychiatric/Behavioral:       No change in chronic depression       Objective:   Physical Exam VS: see vs page GEN: no distress HEAD: head: no deformity eyes: no periorbital swelling, no proptosis external nose and ears are normal mouth: no lesion seen NECK: supple, thyroid is not enlarged CHEST WALL: no deformity LUNGS:  Clear to auscultation BREASTS: sees gyn CV: reg rate and rhythm, no murmur ABD: abdomen is soft, nontender.  no hepatosplenomegaly.  not distended.  no hernia GENITALIA/RECTAL: sees gyn MUSCULOSKELETAL: muscle bulk and strength are grossly normal.  no obvious joint swelling.  gait is normal and steady EXTEMITIES: no deformity.  no ulcer on the feet.  feet are of normal color and temp.  no edema.  There is bilateral onychomycosis of the toenails.  Old healed surgical scars on the right toes.  PULSES: dorsalis pedis intact bilat.  no carotid bruit NEURO:  cn 2-12 grossly intact.   readily moves all 4's.  sensation is intact to touch on the feet, but decreased from normal.  SKIN:  Normal  texture and temperature.  No rash or suspicious lesion is visible.   NODES:  None palpable at the neck PSYCH: alert, well-oriented.  Does not appear anxious nor depressed.      Assessment & Plan:  Wellness visit today, with problems stable, except as noted.   Patient is advised the following: Patient Instructions  please consider these measures for your health:  minimize alcohol.  do not use tobacco products.  have a colonoscopy at least every 10 years from age 74.  Women should have an annual mammogram from age 39.  keep firearms safely stored.  always use seat belts.  have working smoke alarms in your home.  see an eye  doctor and dentist regularly.  never drive under the influence of alcohol or drugs (including prescription drugs).  those with fair skin should take precautions against the sun.  it is critically important to prevent falling down (keep floor areas well-lit, dry, and free of loose objects.  If you have a cane, walker, or wheelchair, you should use it, even for short trips around the house.  Also, try not to rush).   Please come back for a follow-up appointment in 6 months.

## 2015-04-15 NOTE — Patient Instructions (Addendum)
please consider these measures for your health:  minimize alcohol.  do not use tobacco products.  have a colonoscopy at least every 10 years from age 61.  Women should have an annual mammogram from age 62.  keep firearms safely stored.  always use seat belts.  have working smoke alarms in your home.  see an eye doctor and dentist regularly.  never drive under the influence of alcohol or drugs (including prescription drugs).  those with fair skin should take precautions against the sun.  it is critically important to prevent falling down (keep floor areas well-lit, dry, and free of loose objects.  If you have a cane, walker, or wheelchair, you should use it, even for short trips around the house.  Also, try not to rush).   Please come back for a follow-up appointment in 6 months.

## 2015-04-29 ENCOUNTER — Inpatient Hospital Stay: Admission: RE | Admit: 2015-04-29 | Payer: Managed Care, Other (non HMO) | Source: Ambulatory Visit

## 2015-06-14 ENCOUNTER — Ambulatory Visit: Payer: Managed Care, Other (non HMO) | Admitting: Pulmonary Disease

## 2015-09-14 ENCOUNTER — Ambulatory Visit (INDEPENDENT_AMBULATORY_CARE_PROVIDER_SITE_OTHER): Payer: Managed Care, Other (non HMO) | Admitting: Pulmonary Disease

## 2015-09-14 ENCOUNTER — Encounter: Payer: Self-pay | Admitting: Pulmonary Disease

## 2015-09-14 VITALS — BP 126/78 | HR 70 | Ht 67.0 in | Wt 234.4 lb

## 2015-09-14 DIAGNOSIS — Z9989 Dependence on other enabling machines and devices: Principal | ICD-10-CM

## 2015-09-14 DIAGNOSIS — G4733 Obstructive sleep apnea (adult) (pediatric): Secondary | ICD-10-CM | POA: Diagnosis not present

## 2015-09-14 NOTE — Patient Instructions (Signed)
Will arrange for new CPAP machine Follow up in 1 year 

## 2015-09-14 NOTE — Progress Notes (Signed)
Current Outpatient Prescriptions on File Prior to Visit  Medication Sig  . albuterol (PROAIR HFA) 108 (90 BASE) MCG/ACT inhaler INHALE 2 PUFFS EVERY 4 HOURS AS NEEDED FOR WHEEZING, COUGH & SHORTNESS OF BREATH  . atorvastatin (LIPITOR) 40 MG tablet TAKE 1 TABLET (40 MG TOTAL) BY MOUTH DAILY.  . citalopram (CELEXA) 20 MG tablet Take 1 tablet (20 mg total) by mouth daily.  . Fluticasone-Salmeterol (ADVAIR) 100-50 MCG/DOSE AEPB Inhale 1 puff into the lungs 2 (two) times daily.  Marland Kitchen gabapentin (NEURONTIN) 300 MG capsule TAKE 1 CAPSULE (300 MG TOTAL) BY MOUTH AT BEDTIME.  . hyoscyamine (LEVSIN SL) 0.125 MG SL tablet Take 1-2 tablets by mouth or under tongue every 4 hours as needed.  Marland Kitchen levothyroxine (SYNTHROID, LEVOTHROID) 125 MCG tablet TAKE 1 TABLET BY MOUTH EVERY DAY BEFORE BREAKFAST  . losartan-hydrochlorothiazide (HYZAAR) 100-12.5 MG tablet Take 1 tablet by mouth daily.  . metFORMIN (GLUCOPHAGE-XR) 500 MG 24 hr tablet Take 2 tablets (1,000 mg total) by mouth 2 (two) times daily.  . metoprolol succinate (TOPROL-XL) 25 MG 24 hr tablet Take 1 tablet (25 mg total) by mouth daily.  Marland Kitchen omeprazole (PRILOSEC) 40 MG capsule Take 1 capsule (40 mg total) by mouth daily.  . ondansetron (ZOFRAN) 4 MG tablet Take 1 tablet (4 mg total) by mouth every 6 (six) hours as needed for nausea.  . Oxycodone HCl 10 MG TABS Take 1-2 tablets (10-20 mg total) by mouth every 6 (six) hours as needed. For post-op pain  . pioglitazone (ACTOS) 30 MG tablet TAKE 1 TABLET (30 MG TOTAL) BY MOUTH DAILY.  . traZODone (DESYREL) 100 MG tablet Take 1 tablet (100 mg total) by mouth at bedtime.   No current facility-administered medications on file prior to visit.     Chief Complaint  Patient presents with  . Follow-up    Former [facility] pt : Needs new machine - current machine has burnt up motor. Normally wears CPAP nightly. Denies any issues with pressure and mask. Needs updated supplies. DME: [facility]     Sleep tests PSG 09/11/04 >> AHI 119,  SpO2 low 75% CPAP 06/08/15 to 09/05/15 >> used on 89 of 90 nights with average 11 hrs 3 min.  Average AHI 1.7 with CPAP 12 cm H2O  Past medical hx Hypothyroidism, HLD, HTN, Asthma, Fatty liver, Depression  Past surgical hx, Allergies, Family hx, Social hx all reviewed.  Vital Signs BP 126/78 mmHg  Pulse 70  Ht 5\' 7"  (1.702 m)  Wt 234 lb 6.4 oz (106.323 kg)  BMI 36.70 kg/m2  SpO2 97%  History of Present Illness Savannah [name] is a 60 y.o. female with obstructive sleep apnea.  Her current CPAP machine is more than 61 yrs old, and motor seems to have burned out.  She has no issue with mask fit.  She feels CPAP helps her sleep and daytime alertness.    Physical Exam  General - No distress ENT - No sinus tenderness, no oral exudate, no LAN, MP 3 Cardiac - s1s2 regular, no murmur Chest - No wheeze/rales/dullness Back - No focal tenderness Abd - Soft, non-tender Ext - No edema Neuro - Normal strength Skin - No rashes Psych - normal mood, and behavior   Assessment/Plan  Obstructive sleep apnea. - will arrange for new CPAP machine >> explained that she might need repeat sleep study prior to insurance approval of new machine (can be home sleep study) - she is continue CPAP 12 cm H2O    Patient Instructions  Will arrange for new CPAP machine  Follow up in 1 year     Chesley Mires, MD [facility] Pager:  [phone] 09/14/2015, 11:30 AM

## 2015-10-13 ENCOUNTER — Ambulatory Visit (INDEPENDENT_AMBULATORY_CARE_PROVIDER_SITE_OTHER): Payer: Managed Care, Other (non HMO) | Admitting: Endocrinology

## 2015-10-13 ENCOUNTER — Encounter: Payer: Self-pay | Admitting: Endocrinology

## 2015-10-13 VITALS — BP 128/70 | HR 94 | Ht 67.0 in | Wt 235.0 lb

## 2015-10-13 DIAGNOSIS — E119 Type 2 diabetes mellitus without complications: Secondary | ICD-10-CM

## 2015-10-13 DIAGNOSIS — D509 Iron deficiency anemia, unspecified: Secondary | ICD-10-CM | POA: Diagnosis not present

## 2015-10-13 LAB — CBC WITH DIFFERENTIAL/PLATELET
BASOS PCT: 0.4 % (ref 0.0–3.0)
Basophils Absolute: 0 10*3/uL (ref 0.0–0.1)
EOS PCT: 2.6 % (ref 0.0–5.0)
Eosinophils Absolute: 0.1 10*3/uL (ref 0.0–0.7)
HCT: 34.4 % — ABNORMAL LOW (ref 36.0–46.0)
HEMOGLOBIN: 11.8 g/dL — AB (ref 12.0–15.0)
Lymphocytes Relative: 27.8 % (ref 12.0–46.0)
Lymphs Abs: 1.5 10*3/uL (ref 0.7–4.0)
MCHC: 34.2 g/dL (ref 30.0–36.0)
MCV: 88 fl (ref 78.0–100.0)
MONOS PCT: 5.1 % (ref 3.0–12.0)
Monocytes Absolute: 0.3 10*3/uL (ref 0.1–1.0)
Neutro Abs: 3.4 10*3/uL (ref 1.4–7.7)
Neutrophils Relative %: 64.1 % (ref 43.0–77.0)
Platelets: 170 10*3/uL (ref 150.0–400.0)
RBC: 3.91 Mil/uL (ref 3.87–5.11)
RDW: 14.4 % (ref 11.5–15.5)
WBC: 5.3 10*3/uL (ref 4.0–10.5)

## 2015-10-13 LAB — TSH: TSH: 3.17 u[IU]/mL (ref 0.35–4.50)

## 2015-10-13 LAB — POCT GLYCOSYLATED HEMOGLOBIN (HGB A1C): Hemoglobin A1C: 5.5

## 2015-10-13 MED ORDER — PIOGLITAZONE HCL 15 MG PO TABS: 15.0000 mg | ORAL_TABLET | Freq: Every day | ORAL | 3 refills | Status: DC

## 2015-10-13 MED ORDER — GLUCOSE BLOOD VI STRP: 1.0000 | ORAL_STRIP | Freq: Every day | 3 refills | Status: DC

## 2015-10-13 MED ORDER — OXYCODONE HCL 10 MG PO TABS: 10.0000 mg | ORAL_TABLET | Freq: Four times a day (QID) | ORAL | 0 refills | Status: DC | PRN

## 2015-10-13 NOTE — Patient Instructions (Addendum)
A thyroid blood test is requested for you today.  We'll let you know about the results.   Here is a refill of the pain medication.   Please stop the pioglitizone for 2 weeks, then resume at 15 mg daily.  I have sent a prescription to your pharmacy Please come back for a follow-up appointment in 6 months (must be after 04/14/16).

## 2015-10-13 NOTE — Progress Notes (Signed)
Subjective:    Patient ID: Savannah [name], female    DOB: 04-Feb-1956, 60 y.o.   MRN: [id number]  HPI  The state of at least three ongoing medical problems is addressed today, with interval history of each noted here: Pt returns for f/u of diabetes mellitus: DM type: 2 Dx'ed: [id number] Complications:  Therapy: 2 oral meds GDM: never DKA: never Severe hypoglycemia: never Pancreatitis: never Other: she has never been on insulin Interval history: She has fatigue.   Chronic low-back pain: she seldom requires oxycodone. She recently had steroid injection.  HTN: she has edema.  Past Medical History:  Diagnosis Date  . ALLERGIC RHINITIS CAUSE UNSPECIFIED 12/29/2007  . ANEMIA, IRON DEFICIENCY 12/06/2008  . 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 ASTHMA 09/27/2006  . ASYMPTOMATIC POSTMENOPAUSAL STATUS 03/19/2008  . BACK PAIN, LUMBAR 12/29/2009  . Chronic kidney disease   . Depression   . FATTY LIVER DISEASE 03/19/2008  . HYPERCHOLESTEROLEMIA 04/18/2009  . HYPERTENSION 09/27/2006  . HYPOKALEMIA 01/15/2007  . OBSTRUCTIVE SLEEP APNEA 08/09/2009  . 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 Palpitations 05/23/2009  . Sleep apnea    wears a c-pap- settings at 10   . Unspecified hypothyroidism 01/07/2007  . URINARY CALCULUS 11/08/2009    Past Surgical History:  Procedure Laterality Date  . CARDIAC CATHETERIZATION  2011  . compound fracture Right    arm  . KNEE ARTHROSCOPY Left 1993  . right foot surgery     . ROBOTIC ASSITED PARTIAL NEPHRECTOMY Right 05/19/2014   Procedure: ROBOTIC ASSITED PARTIAL NEPHRECTOMY;  Surgeon: Alexis Frock, MD;  Location: [facility];  Service: Urology;  Laterality: Right;    Social History   Social History  . Marital status: Married    Spouse name: N/A  . Number of children: 2  . Years of education: N/A   Occupational History  . Homemaker Unemployed   Social History Main Topics  . Smoking status: Former Smoker    Packs/day: 0.30    Years: 2.00    Types: Cigarettes    Quit  date: 02/27/1975  . Smokeless tobacco: Never Used  . Alcohol use No  . Drug use: No  . Sexual activity: Not on file   Other Topics Concern  . 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 albuterol (PROAIR HFA) 108 (90 BASE) MCG/ACT inhaler INHALE 2 PUFFS EVERY 4 HOURS AS NEEDED FOR WHEEZING, COUGH & SHORTNESS OF BREATH 8.5 Inhaler 11  . atorvastatin (LIPITOR) 40 MG tablet TAKE 1 TABLET (40 MG TOTAL) BY MOUTH DAILY. 90 tablet 0  . citalopram (CELEXA) 20 MG tablet Take 1 tablet (20 mg total) by mouth daily. 90 tablet 3  . Fluticasone-Salmeterol (ADVAIR) 100-50 MCG/DOSE AEPB Inhale 1 puff into the lungs 2 (two) times daily. 1 each 11  . gabapentin (NEURONTIN) 300 MG capsule TAKE 1 CAPSULE (300 MG TOTAL) BY MOUTH AT BEDTIME. 90 capsule 3  . hyoscyamine (LEVSIN SL) 0.125 MG SL tablet Take 1-2 tablets by mouth or under tongue every 4 hours as needed. 100 tablet 5  . levothyroxine (SYNTHROID, LEVOTHROID) 125 MCG tablet TAKE 1 TABLET BY MOUTH EVERY DAY BEFORE BREAKFAST 90 tablet 3  . losartan-hydrochlorothiazide (HYZAAR) 100-12.5 MG tablet Take 1 tablet by mouth daily. 90 tablet 3  . metFORMIN (GLUCOPHAGE-XR) 500 MG 24 hr tablet Take 2 tablets (1,000 mg total) by  mouth 2 (two) times daily. 360 tablet 3  . metoprolol succinate (TOPROL-XL) 25 MG 24 hr tablet Take 1 tablet (25 mg total) by mouth daily. 90 tablet 3  . omeprazole (PRILOSEC) 40 MG capsule Take 1 capsule (40 mg total) by mouth daily. 90 capsule 3  . ondansetron (ZOFRAN) 4 MG tablet Take 1 tablet (4 mg total) by mouth every 6 (six) hours as needed for nausea. 30 tablet 11  . traZODone (DESYREL) 100 MG tablet Take 1 tablet (100 mg total) by mouth at bedtime. 90 tablet 3   No current facility-administered medications on file prior to visit.     Allergies  Allergen Reactions  . Erythromycin Nausea And  Vomiting    REACTION: nausea/vomiting    Family History  Problem Relation Age of Onset  . Colon cancer Mother 60  . Diabetes Mother   . Kidney disease Mother   . Heart attack Father   . Heart disease Father   . Allergies Sister   . Asthma Sister   . Clotting disorder Grandchild   . Heart disease Paternal Grandmother   . Esophageal cancer Neg Hx   . Rectal cancer Neg Hx   . Stomach cancer Neg Hx     BP 128/70   Pulse 94   Ht 5\' 7"  (1.702 m)   Wt 235 lb (106.6 kg)   SpO2 96%   BMI 36.81 kg/m    Review of Systems Denies sob.     Objective:   Physical Exam VITAL SIGNS:  See vs page.   GENERAL: no distress.  Pulses: dorsalis pedis intact bilat.   MSK: no deformity of the feet.   CV: 1+ bilat leg edema.   Skin:  no ulcer on the feet.  normal color and temp on the feet. Neuro: sensation is intact to touch on the feet.   Ext: There is bilateral onychomycosis of the toenails.     A1c=5.5%  Lab Results  Component Value Date   TSH 3.17 10/13/2015      Assessment & Plan:  Type 2 DM: well-controlled.   Edema, new, prob due to pioglitizone.   HTN: well-controlled.   Chronic low-back pain.    Hypothyroidism: well-replaced.

## 2015-10-19 ENCOUNTER — Other Ambulatory Visit: Payer: Self-pay

## 2015-10-19 MED ORDER — GLUCOSE BLOOD VI STRP: ORAL_STRIP | 3 refills | Status: DC

## 2015-10-19 MED ORDER — ONETOUCH DELICA LANCETS FINE MISC: 2 refills | Status: DC

## 2016-01-06 ENCOUNTER — Ambulatory Visit
Admission: RE | Admit: 2016-01-06 | Discharge: 2016-01-06 | Disposition: A | Discharge status: Home / Self Care | Payer: Managed Care, Other (non HMO) | Source: Ambulatory Visit | Attending: Endocrinology | Admitting: Endocrinology

## 2016-01-06 ENCOUNTER — Ambulatory Visit (INDEPENDENT_AMBULATORY_CARE_PROVIDER_SITE_OTHER): Payer: Managed Care, Other (non HMO) | Admitting: Endocrinology

## 2016-01-06 VITALS — BP 120/70 | HR 71 | Wt 232.0 lb

## 2016-01-06 DIAGNOSIS — S9031XA Contusion of right foot, initial encounter: Secondary | ICD-10-CM

## 2016-01-06 DIAGNOSIS — S8001XA Contusion of right knee, initial encounter: Secondary | ICD-10-CM

## 2016-01-06 DIAGNOSIS — Z23 Encounter for immunization: Secondary | ICD-10-CM

## 2016-01-06 DIAGNOSIS — S9030XA Contusion of unspecified foot, initial encounter: Secondary | ICD-10-CM | POA: Insufficient documentation

## 2016-01-06 DIAGNOSIS — E119 Type 2 diabetes mellitus without complications: Secondary | ICD-10-CM

## 2016-01-06 DIAGNOSIS — Z780 Asymptomatic menopausal state: Secondary | ICD-10-CM | POA: Diagnosis not present

## 2016-01-06 DIAGNOSIS — S8000XA Contusion of unspecified knee, initial encounter: Secondary | ICD-10-CM | POA: Insufficient documentation

## 2016-01-06 NOTE — Progress Notes (Signed)
Subjective:    Patient ID: Savannah [name], female    DOB: 1955-05-05, 60 y.o.   MRN: [id number]  HPI 3 days ago, pt tripped and fell at home, striking her right foot and knee.  Since then, she has moderate pain, but no assoc LOC.   Edema is better on reduced pioglitizone.   Past Medical History:  Diagnosis Date  . ALLERGIC RHINITIS CAUSE UNSPECIFIED 12/29/2007  . ANEMIA, IRON DEFICIENCY 12/06/2008  . 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 ASTHMA 09/27/2006  . ASYMPTOMATIC POSTMENOPAUSAL STATUS 03/19/2008  . BACK PAIN, LUMBAR 12/29/2009  . Chronic kidney disease   . Depression   . FATTY LIVER DISEASE 03/19/2008  . HYPERCHOLESTEROLEMIA 04/18/2009  . HYPERTENSION 09/27/2006  . HYPOKALEMIA 01/15/2007  . OBSTRUCTIVE SLEEP APNEA 08/09/2009  . 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 Palpitations 05/23/2009  . Sleep apnea    wears a c-pap- settings at 10   . Unspecified hypothyroidism 01/07/2007  . URINARY CALCULUS 11/08/2009    Past Surgical History:  Procedure Laterality Date  . CARDIAC CATHETERIZATION  2011  . compound fracture Right    arm  . KNEE ARTHROSCOPY Left 1993  . right foot surgery     . ROBOTIC ASSITED PARTIAL NEPHRECTOMY Right 05/19/2014   Procedure: ROBOTIC ASSITED PARTIAL NEPHRECTOMY;  Surgeon: Alexis Frock, MD;  Location: [facility];  Service: Urology;  Laterality: Right;    Social History   Social History  . Marital status: Married    Spouse name: N/A  . Number of children: 2  . Years of education: N/A   Occupational History  . Homemaker Unemployed   Social History Main Topics  . Smoking status: Former Smoker    Packs/day: 0.30    Years: 2.00    Types: Cigarettes    Quit date: 02/27/1975  . Smokeless tobacco: Never Used  . Alcohol use No  . Drug use: No  . Sexual activity: Not on file   Other Topics Concern  . 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 albuterol (PROAIR HFA) 108 (90 BASE) MCG/ACT inhaler INHALE 2 PUFFS EVERY 4 HOURS AS NEEDED FOR WHEEZING, COUGH & SHORTNESS OF BREATH 8.5 Inhaler 11  . atorvastatin (LIPITOR) 40 MG tablet TAKE 1 TABLET (40 MG TOTAL) BY MOUTH DAILY. 90 tablet 0  . citalopram (CELEXA) 20 MG tablet Take 1 tablet (20 mg total) by mouth daily. 90 tablet 3  . Fluticasone-Salmeterol (ADVAIR) 100-50 MCG/DOSE AEPB Inhale 1 puff into the lungs 2 (two) times daily. 1 each 11  . gabapentin (NEURONTIN) 300 MG capsule TAKE 1 CAPSULE (300 MG TOTAL) BY MOUTH AT BEDTIME. 90 capsule 3  . glucose blood (ONETOUCH VERIO) test strip Use to check blood sugar 1 time per day. 100 each 3  . hyoscyamine (LEVSIN SL) 0.125 MG SL tablet Take 1-2 tablets by mouth or under tongue every 4 hours as needed. 100 tablet 5  . levothyroxine (SYNTHROID, LEVOTHROID) 125 MCG tablet TAKE 1 TABLET BY MOUTH EVERY DAY BEFORE BREAKFAST 90 tablet 3  . losartan-hydrochlorothiazide (HYZAAR) 100-12.5 MG tablet Take 1 tablet by mouth daily. 90 tablet 3  . metFORMIN (GLUCOPHAGE-XR) 500 MG 24 hr tablet Take 2 tablets (1,000 mg total) by mouth 2 (two) times daily. 360 tablet 3  . metoprolol succinate (TOPROL-XL) 25 MG 24 hr tablet Take 1 tablet (25 mg  total) by mouth daily. 90 tablet 3  . omeprazole (PRILOSEC) 40 MG capsule Take 1 capsule (40 mg total) by mouth daily. 90 capsule 3  . ondansetron (ZOFRAN) 4 MG tablet Take 1 tablet (4 mg total) by mouth every 6 (six) hours as needed for nausea. 30 tablet 11  . ONETOUCH DELICA LANCETS FINE MISC Use to check blood sugar 1 time per day. 100 each 2  . Oxycodone HCl 10 MG TABS Take 1-2 tablets (10-20 mg total) by mouth every 6 (six) hours as needed. 60 each 0  . pioglitazone (ACTOS) 15 MG tablet Take 1 tablet (15 mg total) by mouth daily. 90 tablet 3  . traZODone (DESYREL) 100 MG tablet Take 1 tablet (100 mg total) by mouth at bedtime. 90 tablet 3   No current facility-administered medications on file  prior to visit.     Allergies  Allergen Reactions  . Erythromycin Nausea And Vomiting    REACTION: nausea/vomiting    Family History  Problem Relation Age of Onset  . Colon cancer Mother 67  . Diabetes Mother   . Kidney disease Mother   . Heart attack Father   . Heart disease Father   . Allergies Sister   . Asthma Sister   . Clotting disorder Grandchild   . Heart disease Paternal Grandmother   . Esophageal cancer Neg Hx   . Rectal cancer Neg Hx   . Stomach cancer Neg Hx     BP 120/70   Pulse 71   Wt 232 lb (105.2 kg)   SpO2 95%   BMI 36.34 kg/m    Review of Systems Denies dizziness and headache.      Objective:   Physical Exam VITAL SIGNS:  See vs page GENERAL: no distress Gait: steady, but favors RLE.  Right knee: normal except for slight swelling and tenderness anteriorly Right foot: at the MTP areas, there is moderate ecchymosis, tend, and swelling. Ext: trace bilat leg edema   A1c=5.7%    Assessment & Plan:  Foot and knee contusions, new. Edema, improved on reduced pioglitizone.    Patient is advised the following: Patient Instructions  X-rays are requested for you today.  We'll let you know about the results.   I hope you feel better soon.  If you don't feel better by next week, please call back.   Please continue the same pioglitizone.   I'll see you next time.

## 2016-01-06 NOTE — Patient Instructions (Addendum)
X-rays are requested for you today.  We'll let you know about the results.   I hope you feel better soon.  If you don't feel better by next week, please call back.   Please continue the same pioglitizone.   I'll see you next time.

## 2016-01-13 ENCOUNTER — Other Ambulatory Visit: Payer: Self-pay | Admitting: Endocrinology

## 2016-01-13 ENCOUNTER — Telehealth: Payer: Self-pay

## 2016-01-13 DIAGNOSIS — E119 Type 2 diabetes mellitus without complications: Secondary | ICD-10-CM

## 2016-01-13 MED ORDER — METOPROLOL SUCCINATE ER 25 MG PO TB24: ORAL_TABLET | ORAL | 2 refills | Status: DC

## 2016-01-13 NOTE — Telephone Encounter (Signed)
Please refill prn 

## 2016-01-16 NOTE — Telephone Encounter (Signed)
Refill submitted on 01/13/2016.

## 2016-01-21 ENCOUNTER — Other Ambulatory Visit: Payer: Self-pay | Admitting: Endocrinology

## 2016-03-15 ENCOUNTER — Other Ambulatory Visit: Payer: Self-pay | Admitting: Endocrinology

## 2016-03-15 DIAGNOSIS — K253 Acute gastric ulcer without hemorrhage or perforation: Secondary | ICD-10-CM

## 2016-03-15 DIAGNOSIS — K2970 Gastritis, unspecified, without bleeding: Secondary | ICD-10-CM

## 2016-03-27 ENCOUNTER — Other Ambulatory Visit: Payer: Self-pay | Admitting: Endocrinology

## 2016-04-02 ENCOUNTER — Telehealth: Payer: Self-pay | Admitting: Endocrinology

## 2016-04-02 DIAGNOSIS — E119 Type 2 diabetes mellitus without complications: Secondary | ICD-10-CM

## 2016-04-02 MED ORDER — LOSARTAN POTASSIUM-HCTZ 100-12.5 MG PO TABS: 1.0000 | ORAL_TABLET | Freq: Every day | ORAL | 3 refills | Status: DC

## 2016-04-02 NOTE — Telephone Encounter (Signed)
Mail order messed up prescription patient ask if you could call losartan-hydrochlorothiazide (HYZAAR) 100-12.5 MG tablet  Send to  losartan-hydrochlorothiazide (HYZAAR) 100-12.5 MG tablet

## 2016-04-02 NOTE — Telephone Encounter (Signed)
Refill submitted. 

## 2016-04-13 ENCOUNTER — Ambulatory Visit: Payer: Managed Care, Other (non HMO) | Admitting: Endocrinology

## 2016-04-16 ENCOUNTER — Ambulatory Visit (HOSPITAL_COMMUNITY)
Admission: RE | Admit: 2016-04-16 | Discharge: 2016-04-16 | Disposition: A | Discharge status: Home / Self Care | Payer: Managed Care, Other (non HMO) | Source: Ambulatory Visit | Attending: Urology | Admitting: Urology

## 2016-04-16 ENCOUNTER — Other Ambulatory Visit: Payer: Self-pay | Admitting: Urology

## 2016-04-16 DIAGNOSIS — Z85528 Personal history of other malignant neoplasm of kidney: Secondary | ICD-10-CM | POA: Diagnosis present

## 2016-04-16 DIAGNOSIS — Z08 Encounter for follow-up examination after completed treatment for malignant neoplasm: Secondary | ICD-10-CM | POA: Diagnosis not present

## 2016-04-16 DIAGNOSIS — I517 Cardiomegaly: Secondary | ICD-10-CM | POA: Insufficient documentation

## 2016-04-17 ENCOUNTER — Ambulatory Visit (INDEPENDENT_AMBULATORY_CARE_PROVIDER_SITE_OTHER): Payer: Managed Care, Other (non HMO) | Admitting: Endocrinology

## 2016-04-17 ENCOUNTER — Encounter: Payer: Self-pay | Admitting: Endocrinology

## 2016-04-17 VITALS — BP 132/80 | HR 66 | Ht 67.0 in | Wt 231.0 lb

## 2016-04-17 DIAGNOSIS — M25572 Pain in left ankle and joints of left foot: Secondary | ICD-10-CM | POA: Insufficient documentation

## 2016-04-17 DIAGNOSIS — E119 Type 2 diabetes mellitus without complications: Secondary | ICD-10-CM | POA: Diagnosis not present

## 2016-04-17 DIAGNOSIS — E213 Hyperparathyroidism, unspecified: Secondary | ICD-10-CM | POA: Diagnosis not present

## 2016-04-17 DIAGNOSIS — G8929 Other chronic pain: Secondary | ICD-10-CM

## 2016-04-17 DIAGNOSIS — Z23 Encounter for immunization: Secondary | ICD-10-CM

## 2016-04-17 DIAGNOSIS — N390 Urinary tract infection, site not specified: Secondary | ICD-10-CM | POA: Diagnosis not present

## 2016-04-17 DIAGNOSIS — Z0001 Encounter for general adult medical examination with abnormal findings: Secondary | ICD-10-CM | POA: Diagnosis not present

## 2016-04-17 DIAGNOSIS — Z0000 Encounter for general adult medical examination without abnormal findings: Secondary | ICD-10-CM

## 2016-04-17 DIAGNOSIS — D509 Iron deficiency anemia, unspecified: Secondary | ICD-10-CM | POA: Diagnosis not present

## 2016-04-17 LAB — BASIC METABOLIC PANEL
BUN: 22 mg/dL (ref 6–23)
CHLORIDE: 101 meq/L (ref 96–112)
CO2: 28 meq/L (ref 19–32)
Calcium: 9.3 mg/dL (ref 8.4–10.5)
Creatinine, Ser: 1.21 mg/dL — ABNORMAL HIGH (ref 0.40–1.20)
GFR: 47.97 mL/min — ABNORMAL LOW (ref >60.00)
Glucose, Bld: 107 mg/dL — ABNORMAL HIGH (ref 70–99)
Potassium: 3.8 mEq/L (ref 3.5–5.1)
Sodium: 139 mEq/L (ref 135–145)

## 2016-04-17 LAB — LIPID PANEL
CHOL/HDL RATIO: 3
Cholesterol: 127 mg/dL (ref 0–200)
HDL: 46 mg/dL (ref >39.00)
LDL Cholesterol: 52 mg/dL (ref 0–99)
NONHDL: 81.41
TRIGLYCERIDES: 146 mg/dL (ref 0.0–149.0)
VLDL: 29.2 mg/dL (ref 0.0–40.0)

## 2016-04-17 LAB — URINALYSIS, ROUTINE W REFLEX MICROSCOPIC
BILIRUBIN URINE: NEGATIVE
Ketones, ur: NEGATIVE
Nitrite: NEGATIVE
PH: 5.5 (ref 5.0–8.0)
Specific Gravity, Urine: 1.02 (ref 1.000–1.030)
TOTAL PROTEIN, URINE-UPE24: NEGATIVE
UROBILINOGEN UA: 0.2 (ref 0.0–1.0)
Urine Glucose: NEGATIVE

## 2016-04-17 LAB — CBC WITH DIFFERENTIAL/PLATELET
BASOS PCT: 0.6 % (ref 0.0–3.0)
Basophils Absolute: 0 10*3/uL (ref 0.0–0.1)
EOS ABS: 0.2 10*3/uL (ref 0.0–0.7)
EOS PCT: 2.9 % (ref 0.0–5.0)
HCT: 37 % (ref 36.0–46.0)
Hemoglobin: 12.6 g/dL (ref 12.0–15.0)
LYMPHS ABS: 1.8 10*3/uL (ref 0.7–4.0)
Lymphocytes Relative: 27.3 % (ref 12.0–46.0)
MCHC: 33.9 g/dL (ref 30.0–36.0)
MCV: 87.6 fl (ref 78.0–100.0)
MONO ABS: 0.3 10*3/uL (ref 0.1–1.0)
Monocytes Relative: 4.5 % (ref 3.0–12.0)
NEUTROS PCT: 64.7 % (ref 43.0–77.0)
Neutro Abs: 4.3 10*3/uL (ref 1.4–7.7)
PLATELETS: 205 10*3/uL (ref 150.0–400.0)
RBC: 4.23 Mil/uL (ref 3.87–5.11)
RDW: 14.4 % (ref 11.5–15.5)
WBC: 6.6 10*3/uL (ref 4.0–10.5)

## 2016-04-17 LAB — HEPATIC FUNCTION PANEL
ALT: 19 U/L (ref 0–35)
AST: 20 U/L (ref 0–37)
Albumin: 4.3 g/dL (ref 3.5–5.2)
Alkaline Phosphatase: 53 U/L (ref 39–117)
BILIRUBIN TOTAL: 1.6 mg/dL — AB (ref 0.2–1.2)
Bilirubin, Direct: 0.3 mg/dL (ref 0.0–0.3)
Total Protein: 7 g/dL (ref 6.0–8.3)

## 2016-04-17 LAB — MICROALBUMIN / CREATININE URINE RATIO
CREATININE, U: 213 mg/dL
MICROALB/CREAT RATIO: 0.8 mg/g (ref 0.0–30.0)
Microalb, Ur: 1.8 mg/dL (ref 0.0–1.9)

## 2016-04-17 LAB — IBC PANEL
IRON: 75 ug/dL (ref 42–145)
Saturation Ratios: 17.6 % — ABNORMAL LOW (ref 20.0–50.0)
Transferrin: 305 mg/dL (ref 212.0–360.0)

## 2016-04-17 LAB — POCT GLYCOSYLATED HEMOGLOBIN (HGB A1C): Hemoglobin A1C: 5.8

## 2016-04-17 LAB — VITAMIN D 25 HYDROXY (VIT D DEFICIENCY, FRACTURES): VITD: 51.59 ng/mL (ref 30.00–100.00)

## 2016-04-17 LAB — TSH: TSH: 2.64 u[IU]/mL (ref 0.35–4.50)

## 2016-04-17 MED ORDER — CEFUROXIME AXETIL 500 MG PO TABS: 500.0000 mg | ORAL_TABLET | Freq: Two times a day (BID) | ORAL | 0 refills | Status: DC

## 2016-04-17 NOTE — Patient Instructions (Signed)
Please consider these measures for your health:  minimize alcohol.  Do not use tobacco products.  Have a colonoscopy at least every 10 years from age 62.  Women should have an annual mammogram from age 94.  Keep firearms safely stored.  Always use seat belts.  have working smoke alarms in your home.  See an eye doctor and dentist regularly.  Never drive under the influence of alcohol or drugs (including prescription drugs).  Those with fair skin should take precautions against the sun, and should carefully examine their skin once per month, for any new or changed moles. Please see an orthopedic specialist.  you will receive a phone call, about a day and time for an appointment. blood tests are requested for you today.  We'll let you know about the results. Please come back for a follow-up appointment in 6 months.

## 2016-04-17 NOTE — Progress Notes (Signed)
Subjective:    Patient ID: Savannah [name], female    DOB: [birth year], 61 y.o.   MRN: [id number]  HPI Pt is here for regular wellness examination, and is feeling pretty well in general, and says chronic med probs are stable, except as noted below Past Medical History:  Diagnosis Date  . ALLERGIC RHINITIS CAUSE UNSPECIFIED 12/29/2007  . ANEMIA, IRON DEFICIENCY 12/06/2008  . 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 ASTHMA 09/27/2006  . ASYMPTOMATIC POSTMENOPAUSAL STATUS 03/19/2008  . BACK PAIN, LUMBAR 12/29/2009  . Chronic kidney disease   . Depression   . FATTY LIVER DISEASE 03/19/2008  . HYPERCHOLESTEROLEMIA 04/18/2009  . HYPERTENSION 09/27/2006  . HYPOKALEMIA 01/15/2007  . OBSTRUCTIVE SLEEP APNEA 08/09/2009  . 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 Palpitations 05/23/2009  . Sleep apnea    wears a c-pap- settings at 10   . Unspecified hypothyroidism 01/07/2007  . URINARY CALCULUS 11/08/2009    Past Surgical History:  Procedure Laterality Date  . CARDIAC CATHETERIZATION  2011  . compound fracture Right    arm  . KNEE ARTHROSCOPY Left 1993  . right foot surgery     . ROBOTIC ASSITED PARTIAL NEPHRECTOMY Right 05/19/2014   Procedure: ROBOTIC ASSITED PARTIAL NEPHRECTOMY;  Surgeon: Alexis Frock, MD;  Location: [facility];  Service: Urology;  Laterality: Right;    Social History   Social History  . Marital status: Married    Spouse name: N/A  . Number of children: 2  . Years of education: N/A   Occupational History  . Homemaker Unemployed   Social History Main Topics  . Smoking status: Former Smoker    Packs/day: 0.30    Years: 2.00    Types: Cigarettes    Quit date: 02/27/1975  . Smokeless tobacco: Never Used  . Alcohol use No  . Drug use: No  . Sexual activity: Not on file   Other Topics Concern  . 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  albuterol (PROAIR HFA) 108 (90 BASE) MCG/ACT inhaler INHALE 2 PUFFS EVERY 4 HOURS AS NEEDED FOR WHEEZING, COUGH & SHORTNESS OF BREATH 8.5 Inhaler 11  . atorvastatin (LIPITOR) 40 MG tablet TAKE 1 TABLET (40 MG TOTAL) BY MOUTH DAILY. 90 tablet 0  . atorvastatin (LIPITOR) 40 MG tablet TAKE 1 TABLET BY MOUTH (40MG  TOTAL) DAILY 90 tablet 2  . citalopram (CELEXA) 20 MG tablet Take 1 tablet (20 mg total) by mouth daily. 90 tablet 3  . Fluticasone-Salmeterol (ADVAIR) 100-50 MCG/DOSE AEPB Inhale 1 puff into the lungs 2 (two) times daily. 1 each 11  . gabapentin (NEURONTIN) 300 MG capsule TAKE 1 CAPSULE BY MOUTH (300MG  TOTAL) AT BEDTIME 90 capsule 2  . glucose blood (ONETOUCH VERIO) test strip Use to check blood sugar 1 time per day. 100 each 3  . hyoscyamine (LEVSIN SL) 0.125 MG SL tablet Take 1-2 tablets by mouth or under tongue every 4 hours as needed. 100 tablet 5  . levothyroxine (SYNTHROID, LEVOTHROID) 125 MCG tablet TAKE 1 TABLET BY MOUTH EVERY DAY BEFORE BREAKFAST 90 tablet 2  . losartan-hydrochlorothiazide (HYZAAR) 100-12.5 MG tablet Take 1 tablet by mouth daily. 90 tablet 3  . metFORMIN (GLUCOPHAGE-XR) 500 MG 24 hr tablet TAKE 2 TABLETS BY MOUTH (1000MG  TOTAL) TWICE DAILY 360 tablet 2  . metoprolol succinate (TOPROL-XL) 25 MG 24 hr tablet TAKE 1  TABLET BY MOUTH (25MG  TOTAL) DAILY 90 tablet 2  . omeprazole (PRILOSEC) 40 MG capsule TAKE 1 CAPSULE BY MOUTH DAILY 90 capsule 2  . ondansetron (ZOFRAN) 4 MG tablet Take 1 tablet (4 mg total) by mouth every 6 (six) hours as needed for nausea. 30 tablet 11  . ONETOUCH DELICA LANCETS FINE MISC Use to check blood sugar 1 time per day. 100 each 2  . Oxycodone HCl 10 MG TABS Take 1-2 tablets (10-20 mg total) by mouth every 6 (six) hours as needed. 60 each 0  . pioglitazone (ACTOS) 15 MG tablet Take 1 tablet (15 mg total) by mouth daily. 90 tablet 3  . traZODone (DESYREL) 100 MG tablet TAKE 1 TABLET BY MOUTH (100MG  TOTAL) AT BEDTIME 90 tablet 2   No current  facility-administered medications on file prior to visit.     Allergies  Allergen Reactions  . Erythromycin Nausea And Vomiting    REACTION: nausea/vomiting    Family History  Problem Relation Age of Onset  . Colon cancer Mother 77  . Diabetes Mother   . Kidney disease Mother   . Heart attack Father   . Heart disease Father   . Allergies Sister   . Asthma Sister   . Clotting disorder Grandchild   . Heart disease Paternal Grandmother   . Esophageal cancer Neg Hx   . Rectal cancer Neg Hx   . Stomach cancer Neg Hx     BP 132/80   Pulse 66   Ht 5\' 7"  (1.702 m)   Wt 231 lb (104.8 kg)   SpO2 94%   BMI 36.18 kg/m     Review of Systems Constitutional: Negative for weight change.  HENT: Negative for hearing loss.   Eyes: Negative for visual disturbance.  Respiratory: Negative for shortness of breath.   Cardiovascular: Negative for chest pain.  Gastrointestinal: Negative for anal bleeding.  Endocrine: Negative for cold intolerance.  Genitourinary: Negative for hematuria.  Musculoskeletal: Positive for back pain.  Skin: Negative for itching.  Allergic/Immunologic: Negative for environmental allergies.  Neurological: Negative for syncope.  Hematological: Bruises/bleeds easily.  Psychiatric/Behavioral: denies depression     Objective:   Physical Exam VS: see vs page GEN: no distress HEAD: head: no deformity eyes: no periorbital swelling, no proptosis external nose and ears are normal mouth: no lesion seen NECK: supple, thyroid is not enlarged CHEST WALL: no deformity LUNGS:  Clear to auscultation BREASTS: sees gyn CV: reg rate and rhythm, no murmur ABD: abdomen is soft, nontender.  no hepatosplenomegaly.  not distended.  no hernia.   GENITALIA/RECTAL: sees gyn MUSCULOSKELETAL: muscle bulk and strength are grossly normal.  no obvious joint swelling.  gait is normal and steady.   PULSES: no carotid bruit SKIN:  Normal texture and temperature.  No rash or  suspicious lesion is visible.   NODES:  None palpable at the neck PSYCH: alert, well-oriented.  Does not appear anxious nor depressed.   I personally reviewed electrocardiogram tracing (today): Indication: DM Impression: NSR.  No MI.  No hypertrophy.  ? Old inferior infarct.  Compared to 2016: no change     Assessment & Plan:  Wellness visit today, with problems stable, except as noted.  SEPARATE EVALUATION FOLLOWS--EACH PROBLEM HERE IS NEW, NOT RESPONDING TO TREATMENT, OR POSES SIGNIFICANT RISK TO THE PATIENT'S HEALTH: HISTORY OF THE PRESENT ILLNESS: Pt states few mos of moderate pain at the post aspect of left ankle, but no assoc rash.   PAST MEDICAL HISTORY Past Medical  History:  Diagnosis Date  . ALLERGIC RHINITIS CAUSE UNSPECIFIED 12/29/2007  . ANEMIA, IRON DEFICIENCY 12/06/2008  . 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 ASTHMA 09/27/2006  . ASYMPTOMATIC POSTMENOPAUSAL STATUS 03/19/2008  . BACK PAIN, LUMBAR 12/29/2009  . Chronic kidney disease   . Depression   . FATTY LIVER DISEASE 03/19/2008  . HYPERCHOLESTEROLEMIA 04/18/2009  . HYPERTENSION 09/27/2006  . HYPOKALEMIA 01/15/2007  . OBSTRUCTIVE SLEEP APNEA 08/09/2009  . 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 Palpitations 05/23/2009  . Sleep apnea    wears a c-pap- settings at 10   . Unspecified hypothyroidism 01/07/2007  . URINARY CALCULUS 11/08/2009    Past Surgical History:  Procedure Laterality Date  . CARDIAC CATHETERIZATION  2011  . compound fracture Right    arm  . KNEE ARTHROSCOPY Left 1993  . right foot surgery     . ROBOTIC ASSITED PARTIAL NEPHRECTOMY Right 05/19/2014   Procedure: ROBOTIC ASSITED PARTIAL NEPHRECTOMY;  Surgeon: Alexis Frock, MD;  Location: [facility];  Service: Urology;  Laterality: Right;    Social History   Social History  . Marital status: Married    Spouse name: N/A  . Number of children: 2  . Years of education: N/A   Occupational History  . Homemaker Unemployed   Social History Main Topics  . Smoking  status: Former Smoker    Packs/day: 0.30    Years: 2.00    Types: Cigarettes    Quit date: 02/27/1975  . Smokeless tobacco: Never Used  . Alcohol use No  . Drug use: No  . Sexual activity: Not on file   Other Topics Concern  . 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 albuterol (PROAIR HFA) 108 (90 BASE) MCG/ACT inhaler INHALE 2 PUFFS EVERY 4 HOURS AS NEEDED FOR WHEEZING, COUGH & SHORTNESS OF BREATH 8.5 Inhaler 11  . atorvastatin (LIPITOR) 40 MG tablet TAKE 1 TABLET (40 MG TOTAL) BY MOUTH DAILY. 90 tablet 0  . atorvastatin (LIPITOR) 40 MG tablet TAKE 1 TABLET BY MOUTH (40MG  TOTAL) DAILY 90 tablet 2  . citalopram (CELEXA) 20 MG tablet Take 1 tablet (20 mg total) by mouth daily. 90 tablet 3  . Fluticasone-Salmeterol (ADVAIR) 100-50 MCG/DOSE AEPB Inhale 1 puff into the lungs 2 (two) times daily. 1 each 11  . gabapentin (NEURONTIN) 300 MG capsule TAKE 1 CAPSULE BY MOUTH (300MG  TOTAL) AT BEDTIME 90 capsule 2  . glucose blood (ONETOUCH VERIO) test strip Use to check blood sugar 1 time per day. 100 each 3  . hyoscyamine (LEVSIN SL) 0.125 MG SL tablet Take 1-2 tablets by mouth or under tongue every 4 hours as needed. 100 tablet 5  . levothyroxine (SYNTHROID, LEVOTHROID) 125 MCG tablet TAKE 1 TABLET BY MOUTH EVERY DAY BEFORE BREAKFAST 90 tablet 2  . losartan-hydrochlorothiazide (HYZAAR) 100-12.5 MG tablet Take 1 tablet by mouth daily. 90 tablet 3  . metFORMIN (GLUCOPHAGE-XR) 500 MG 24 hr tablet TAKE 2 TABLETS BY MOUTH (1000MG  TOTAL) TWICE DAILY 360 tablet 2  . metoprolol succinate (TOPROL-XL) 25 MG 24 hr tablet TAKE 1 TABLET BY MOUTH (25MG  TOTAL) DAILY 90 tablet 2  . omeprazole (PRILOSEC) 40 MG capsule TAKE 1 CAPSULE BY MOUTH DAILY 90 capsule 2  . ondansetron (ZOFRAN) 4 MG tablet Take 1 tablet (4 mg total) by mouth every 6 (six) hours as needed for nausea. 30  tablet  11  . ONETOUCH DELICA LANCETS FINE MISC Use to check blood sugar 1 time per day. 100 each 2  . Oxycodone HCl 10 MG TABS Take 1-2 tablets (10-20 mg total) by mouth every 6 (six) hours as needed. 60 each 0  . pioglitazone (ACTOS) 15 MG tablet Take 1 tablet (15 mg total) by mouth daily. 90 tablet 3  . traZODone (DESYREL) 100 MG tablet TAKE 1 TABLET BY MOUTH (100MG  TOTAL) AT BEDTIME 90 tablet 2   No current facility-administered medications on file prior to visit.     Allergies  Allergen Reactions  . Erythromycin Nausea And Vomiting    REACTION: nausea/vomiting    Family History  Problem Relation Age of Onset  . Colon cancer Mother 6  . Diabetes Mother   . Kidney disease Mother   . Heart attack Father   . Heart disease Father   . Allergies Sister   . Asthma Sister   . Clotting disorder Grandchild   . Heart disease Paternal Grandmother   . Esophageal cancer Neg Hx   . Rectal cancer Neg Hx   . Stomach cancer Neg Hx     BP 132/80   Pulse 66   Ht 5\' 7"  (1.702 m)   Wt 231 lb (104.8 kg)   SpO2 94%   BMI 36.18 kg/m   REVIEW OF SYSTEMS: Denies fever PHYSICAL EXAMINATION: VITAL SIGNS:  See vs page GENERAL: no distress Pulses: dorsalis pedis intact bilat.   MSK: no deformity of the feet CV: no leg edema Skin:  no ulcer on the feet.  normal color and temp on the feet. Neuro: sensation is intact to touch on the feet, but decreased from normal Ext: There is bilateral onychomycosis of the toenails.  Old healed surgical scars on the right toes. LAB/XRAY RESULTS: UA pos for UTI IMPRESSION: UTI: new Ankle pain, new to me, uncertain etiology PLAN:  Ref ortho I rx'ed abx

## 2016-04-18 LAB — PTH, INTACT AND CALCIUM
Calcium: 9.3 mg/dL (ref 8.6–10.4)
PTH: 31 pg/mL (ref 14–64)

## 2016-04-18 LAB — HIV ANTIBODY (ROUTINE TESTING W REFLEX): HIV: NONREACTIVE

## 2016-04-18 LAB — HEPATITIS C ANTIBODY: HCV Ab: NEGATIVE

## 2016-04-18 MED ORDER — CEFUROXIME AXETIL 500 MG PO TABS: ORAL_TABLET | ORAL | 0 refills | Status: DC

## 2016-04-18 NOTE — Telephone Encounter (Signed)
Refill submitted. 

## 2016-04-18 NOTE — Telephone Encounter (Signed)
[place] Jenny Reichmann) called stated that he think that it would better if this medication was sent [facility] locally being it was an Acute antibiotic cefUROXime (CEFTIN) 500 MG tablet    Any Questions call [phone] opt 1  Ref [phone]

## 2016-04-26 ENCOUNTER — Ambulatory Visit (INDEPENDENT_AMBULATORY_CARE_PROVIDER_SITE_OTHER): Payer: Managed Care, Other (non HMO) | Admitting: Orthopaedic Surgery

## 2016-04-26 DIAGNOSIS — M7662 Achilles tendinitis, left leg: Secondary | ICD-10-CM

## 2016-04-26 MED ORDER — PREDNISONE 10 MG (21) PO TBPK: ORAL_TABLET | ORAL | 0 refills | Status: DC

## 2016-04-26 MED ORDER — DICLOFENAC SODIUM 1 % TD GEL: 2.0000 g | Freq: Four times a day (QID) | TRANSDERMAL | 5 refills | Status: DC

## 2016-04-26 NOTE — Progress Notes (Signed)
Office Visit Note   Patient: Savannah [name]           Date of Birth: [birth year]           MRN: [id number] Visit Date: 04/26/2016              Requested by: Renato Shin, MD [address] [place],  [address] PCP: Renato Shin, MD   Assessment & Plan: Visit Diagnoses:  1. Achilles tendinitis, left leg     Plan: Patient has a left insertional Achilles tendinosis. I recommend a CAM walker for a couple weeks and then wean as tolerated. I also prescribed her physical therapy, Voltaren gel, Sterapred. I'll see her back as needed.  Follow-Up Instructions: Return if symptoms worsen or fail to improve.   Orders:  No orders of the defined types were placed in this encounter.  Meds ordered this encounter  Medications  . diclofenac sodium (VOLTAREN) 1 % GEL    Sig: Apply 2 g topically 4 (four) times daily.    Dispense:  1 Tube    Refill:  5  . predniSONE (STERAPRED UNI-PAK 21 TAB) 10 MG (21) TBPK tablet    Sig: Take as directed    Dispense:  21 tablet    Refill:  0      Procedures: No procedures performed   Clinical Data: No additional findings.   Subjective: No chief complaint on file.   Patient is a very pleasant 61 year old female who has had couple months history of left heel pain localized over the Achilles tendon and its insertion. She denies swelling. She endorses worsening pain along the Achilles that sometimes will radiate up into the calf. She denies any back pain. The pain is worse with increased activity and with walking. She denies any injuries.    Review of Systems  Constitutional: Negative.   HENT: Negative.   Eyes: Negative.   Respiratory: Negative.   Cardiovascular: Negative.   Endocrine: Negative.   Musculoskeletal: Negative.   Neurological: Negative.   Hematological: Negative.   Psychiatric/Behavioral: Negative.   All other systems reviewed and are negative.    Objective: Vital Signs: There were no vitals taken for  this visit.  Physical Exam  Constitutional: She is oriented to person, place, and time. She appears well-developed and well-nourished.  HENT:  Head: Normocephalic and atraumatic.  Eyes: EOM are normal.  Neck: Neck supple.  Pulmonary/Chest: Effort normal.  Abdominal: Soft.  Neurological: She is alert and oriented to person, place, and time.  Skin: Skin is warm. Capillary refill takes less than 2 seconds.  Psychiatric: She has a normal mood and affect. Her behavior is normal. Judgment and thought content normal.  Nursing note and vitals reviewed.   Ortho Exam Exam of the left heel and ankle shows no swelling. She is very tender over the insertion of the Achilles. Passive dorsiflexion of the ankle also elicits mild pain. Her plantar fascia is nontender. Negative Tinel's at the tarsal tunnel. Posterior tibial tendon is nontender. Specialty Comments:  No specialty comments available.  Imaging: No results found.   PMFS History: Patient Active Problem List   Diagnosis Date Noted  . Achilles tendinitis, left leg 04/26/2016  . Ankle pain, left 04/17/2016  . UTI (urinary tract infection) 04/17/2016  . Contusion of knee 01/06/2016  . Foot contusion 01/06/2016  . Gastric peptic ulcer 04/15/2015  . Cardiomegaly 01/14/2015  . Postoperative anemia due to acute blood loss 06/25/2014  . Renal mass 05/19/2014  .  Dyspnea 10/28/2013  . Renal insufficiency 08/07/2013  . Hypokalemia 03/11/2013  . Sinusitis, acute 02/04/2013  . Right wrist pain 08/26/2012  . Neoplasm of uncertain behavior of skin 08/26/2012  . Routine general medical examination at a health care facility 08/02/2012  . Nausea with vomiting 08/01/2012  . Abdominal pain, epigastric 01/15/2012  . Vertigo 07/24/2011  . Vitamin D deficiency 12/09/2010  . Hyperparathyroidism ([place]) 12/06/2010  . Dysuria 10/23/2010  . Cough 10/23/2010  . Muscle weakness 07/14/2010  . 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  BACK PAIN, LUMBAR 12/29/2009  . URINARY CALCULUS 11/08/2009  . Obstructive sleep apnea 08/09/2009  . HEMATURIA UNSPECIFIED 06/28/2009  . OTITIS MEDIA, LEFT 06/14/2009  . Palpitations 05/23/2009  . HYPERCHOLESTEROLEMIA 04/18/2009  . 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 CHEST PAIN 04/18/2009  . Iron deficiency anemia 12/06/2008  . DIZZINESS 12/06/2008  . NASH (nonalcoholic steatohepatitis) 03/19/2008  . ANKLE SPRAIN, RIGHT 03/19/2008  . Asymptomatic postmenopausal status 03/19/2008  . ALLERGIC RHINITIS CAUSE UNSPECIFIED 12/29/2007  . ASTHMATIC BRONCHITIS, ACUTE 08/16/2007  . NECK PAIN 06/18/2007  . Diabetes ([place]) 04/18/2007  . HYPOKALEMIA 01/15/2007  . Hypothyroidism 01/07/2007  . Essential hypertension 09/27/2006  . ASTHMA 09/27/2006   Past Medical History:  Diagnosis Date  . ALLERGIC RHINITIS CAUSE UNSPECIFIED 12/29/2007  . ANEMIA, IRON DEFICIENCY 12/06/2008  . 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 ASTHMA 09/27/2006  . ASYMPTOMATIC POSTMENOPAUSAL STATUS 03/19/2008  . BACK PAIN, LUMBAR 12/29/2009  . Chronic kidney disease   . Depression   . FATTY LIVER DISEASE 03/19/2008  . HYPERCHOLESTEROLEMIA 04/18/2009  . HYPERTENSION 09/27/2006  . HYPOKALEMIA 01/15/2007  . OBSTRUCTIVE SLEEP APNEA 08/09/2009  . 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 Palpitations 05/23/2009  . Sleep apnea    wears a c-pap- settings at 10   . Unspecified hypothyroidism 01/07/2007  . URINARY CALCULUS 11/08/2009    Family History  Problem Relation Age of Onset  . Colon cancer Mother 68  . Diabetes Mother   . Kidney disease Mother   . Heart attack Father   . Heart disease Father   . Allergies Sister   . Asthma Sister   . Clotting disorder Grandchild   . Heart disease Paternal Grandmother   . Esophageal cancer Neg Hx   . Rectal cancer Neg Hx   . Stomach cancer Neg Hx     Past Surgical History:  Procedure Laterality Date  . CARDIAC CATHETERIZATION  2011  . compound fracture Right    arm  . KNEE ARTHROSCOPY Left  1993  . right foot surgery     . ROBOTIC ASSITED PARTIAL NEPHRECTOMY Right 05/19/2014   Procedure: ROBOTIC ASSITED PARTIAL NEPHRECTOMY;  Surgeon: Alexis Frock, MD;  Location: [facility];  Service: Urology;  Laterality: Right;   Social History   Occupational History  . Homemaker Unemployed   Social History Main Topics  . Smoking status: Former Smoker    Packs/day: 0.30    Years: 2.00    Types: Cigarettes    Quit date: 02/27/1975  . Smokeless tobacco: Never Used  . Alcohol use No  . Drug use: No  . Sexual activity: Not on file

## 2016-05-03 ENCOUNTER — Telehealth: Payer: Self-pay | Admitting: Endocrinology

## 2016-05-03 DIAGNOSIS — K2970 Gastritis, unspecified, without bleeding: Secondary | ICD-10-CM

## 2016-05-03 DIAGNOSIS — K253 Acute gastric ulcer without hemorrhage or perforation: Secondary | ICD-10-CM

## 2016-05-03 MED ORDER — CITALOPRAM HYDROBROMIDE 20 MG PO TABS: 20.0000 mg | ORAL_TABLET | Freq: Every day | ORAL | 3 refills | Status: DC

## 2016-05-03 MED ORDER — OMEPRAZOLE 40 MG PO CPDR: 40.0000 mg | DELAYED_RELEASE_CAPSULE | Freq: Every day | ORAL | 2 refills | Status: DC

## 2016-05-03 NOTE — Telephone Encounter (Signed)
Refill  citalopram (CELEXA) 20 MG tablet omeprazole (PRILOSEC) 40 MG capsule  90 day supply   [id number] [place], [place] - [address]  Patient would like all medication to go to this pharmacy.

## 2016-05-03 NOTE — Telephone Encounter (Signed)
Refills submitted.  

## 2016-05-16 ENCOUNTER — Ambulatory Visit (INDEPENDENT_AMBULATORY_CARE_PROVIDER_SITE_OTHER): Payer: Managed Care, Other (non HMO) | Admitting: Pulmonary Disease

## 2016-05-16 ENCOUNTER — Encounter: Payer: Self-pay | Admitting: Pulmonary Disease

## 2016-05-16 VITALS — BP 132/74 | HR 99 | Ht 68.0 in | Wt 229.8 lb

## 2016-05-16 DIAGNOSIS — B9789 Other viral agents as the cause of diseases classified elsewhere: Secondary | ICD-10-CM | POA: Diagnosis not present

## 2016-05-16 DIAGNOSIS — J069 Acute upper respiratory infection, unspecified: Secondary | ICD-10-CM

## 2016-05-16 DIAGNOSIS — Z9989 Dependence on other enabling machines and devices: Secondary | ICD-10-CM

## 2016-05-16 DIAGNOSIS — G4733 Obstructive sleep apnea (adult) (pediatric): Secondary | ICD-10-CM

## 2016-05-16 NOTE — Patient Instructions (Signed)
Follow up in 1 year.

## 2016-05-16 NOTE — Progress Notes (Signed)
Current Outpatient Prescriptions on File Prior to Visit  Medication Sig  . albuterol (PROAIR HFA) 108 (90 BASE) MCG/ACT inhaler INHALE 2 PUFFS EVERY 4 HOURS AS NEEDED FOR WHEEZING, COUGH & SHORTNESS OF BREATH  . atorvastatin (LIPITOR) 40 MG tablet TAKE 1 TABLET BY MOUTH (40MG  TOTAL) DAILY  . cefUROXime (CEFTIN) 500 MG tablet Take 1 tablet (500 mg total) by mouth 2 (two) times daily with a meal.  . citalopram (CELEXA) 20 MG tablet Take 1 tablet (20 mg total) by mouth daily.  . diclofenac sodium (VOLTAREN) 1 % GEL Apply 2 g topically 4 (four) times daily.  . Fluticasone-Salmeterol (ADVAIR) 100-50 MCG/DOSE AEPB Inhale 1 puff into the lungs 2 (two) times daily.  Marland Kitchen gabapentin (NEURONTIN) 300 MG capsule TAKE 1 CAPSULE BY MOUTH (300MG  TOTAL) AT BEDTIME  . glucose blood (ONETOUCH VERIO) test strip Use to check blood sugar 1 time per day.  . hyoscyamine (LEVSIN SL) 0.125 MG SL tablet Take 1-2 tablets by mouth or under tongue every 4 hours as needed.  Marland Kitchen levothyroxine (SYNTHROID, LEVOTHROID) 125 MCG tablet TAKE 1 TABLET BY MOUTH EVERY DAY BEFORE BREAKFAST  . losartan-hydrochlorothiazide (HYZAAR) 100-12.5 MG tablet Take 1 tablet by mouth daily.  . metFORMIN (GLUCOPHAGE-XR) 500 MG 24 hr tablet TAKE 2 TABLETS BY MOUTH (1000MG  TOTAL) TWICE DAILY  . metoprolol succinate (TOPROL-XL) 25 MG 24 hr tablet TAKE 1 TABLET BY MOUTH (25MG  TOTAL) DAILY  . omeprazole (PRILOSEC) 40 MG capsule Take 1 capsule (40 mg total) by mouth daily.  . ondansetron (ZOFRAN) 4 MG tablet Take 1 tablet (4 mg total) by mouth every 6 (six) hours as needed for nausea.  Savannah [name] DELICA LANCETS FINE MISC Use to check blood sugar 1 time per day.  . Oxycodone HCl 10 MG TABS Take 1-2 tablets (10-20 mg total) by mouth every 6 (six) hours as needed.  . pioglitazone (ACTOS) 15 MG tablet Take 1 tablet (15 mg total) by mouth daily.  . traZODone (DESYREL) 100 MG tablet TAKE 1 TABLET BY MOUTH (100MG  TOTAL) AT BEDTIME   No current facility-administered  medications on file prior to visit.      Chief Complaint  Patient presents with  . Follow-up    OSA - doing well on CPAP.  denies any mask, fit for pressure issues     Sleep tests PSG 09/11/04 >> AHI 119, SpO2 low 75% CPAP 04/15/16 to 05/14/17 >> used on 30 of 30 nights with average 11 hrs 46 min.  Average AHI 4.9 with CPAP 12 cm H2O  Past medical history Hypothyroidism, HLD, HTN, Asthma, Fatty liver, Depression  Past surgical history, Family history, Social history, Allergies reviewed  Vital Signs BP 132/74 (BP Location: Left Arm, Cuff Size: Large)   Pulse 99   Ht 5\' 8"  (1.727 m)   Wt 229 lb 12.8 oz (104.2 kg)   SpO2 100%   BMI 34.94 kg/m   History of Present Illness Savannah [name] is a 61 y.o. female with obstructive sleep apnea.  She has been doing well with CPAP.  No issues with mask fit.  Her daughter had strep throat and pneumonia.  She thinks she caught a virus.  She has been feeling sick for the past 5 days.  She thinks she had a fever at the beginning.  She is getting sinus congestion, post nasal drip, and dry cough.  Her ears feel plugged.  She is not having wheeze, chest pain, abdominal pain, nausea, skin rash, or swelling.  She has been  trying to sleep more for the past few days.    CPAP download reviewed with her.  Physical Exam  General - alert Eyes - wears glasses, pupils reactive ENT - no sinus tenderness, clear to yellow nasal drainage, no oral exudate, TM clear, no LAN Cardiac - regular, no murmur Chest - no wheeze Back - no tenderness Abd - soft, non tender Ext - no edema Neuro - normal strength Skin - no rashes Psych - normal mood   Assessment/Plan  Obstructive sleep apnea. - she is compliant with CPAP and reports benefit - continue CPAP 12 cm H2O  Viral URI. - continue nasal irrigation - she can try OTC flonase and decongestant - prn tylenol/NSAID for symptom relief - discuss symptoms to monitor for that would indicate bacterial  superinfection and need for antibiotics   Patient Instructions  Follow up in 1 year    Chesley Mires, MD [place] Pager:  [phone] 05/16/2016, 12:31 PM

## 2016-06-17 ENCOUNTER — Emergency Department (HOSPITAL_COMMUNITY): Payer: Managed Care, Other (non HMO)

## 2016-06-17 ENCOUNTER — Encounter (HOSPITAL_COMMUNITY): Payer: Self-pay | Admitting: Emergency Medicine

## 2016-06-17 ENCOUNTER — Emergency Department (HOSPITAL_COMMUNITY)
Admission: EM | Admit: 2016-06-17 | Discharge: 2016-06-17 | Disposition: A | Discharge status: Home / Self Care | Payer: Managed Care, Other (non HMO) | Attending: Emergency Medicine | Admitting: Emergency Medicine

## 2016-06-17 DIAGNOSIS — Z87891 Personal history of nicotine dependence: Secondary | ICD-10-CM | POA: Diagnosis not present

## 2016-06-17 DIAGNOSIS — S40021A Contusion of right upper arm, initial encounter: Secondary | ICD-10-CM | POA: Diagnosis not present

## 2016-06-17 DIAGNOSIS — I10 Essential (primary) hypertension: Secondary | ICD-10-CM | POA: Diagnosis not present

## 2016-06-17 DIAGNOSIS — J45909 Unspecified asthma, uncomplicated: Secondary | ICD-10-CM | POA: Insufficient documentation

## 2016-06-17 DIAGNOSIS — Z7984 Long term (current) use of oral hypoglycemic drugs: Secondary | ICD-10-CM | POA: Insufficient documentation

## 2016-06-17 DIAGNOSIS — W010XXA Fall on same level from slipping, tripping and stumbling without subsequent striking against object, initial encounter: Secondary | ICD-10-CM | POA: Insufficient documentation

## 2016-06-17 DIAGNOSIS — W19XXXA Unspecified fall, initial encounter: Secondary | ICD-10-CM

## 2016-06-17 DIAGNOSIS — Y939 Activity, unspecified: Secondary | ICD-10-CM | POA: Insufficient documentation

## 2016-06-17 DIAGNOSIS — M545 Low back pain, unspecified: Secondary | ICD-10-CM

## 2016-06-17 DIAGNOSIS — E039 Hypothyroidism, unspecified: Secondary | ICD-10-CM | POA: Insufficient documentation

## 2016-06-17 DIAGNOSIS — Y92002 Bathroom of unspecified non-institutional (private) residence single-family (private) house as the place of occurrence of the external cause: Secondary | ICD-10-CM | POA: Insufficient documentation

## 2016-06-17 DIAGNOSIS — E119 Type 2 diabetes mellitus without complications: Secondary | ICD-10-CM | POA: Diagnosis not present

## 2016-06-17 DIAGNOSIS — S4991XA Unspecified injury of right shoulder and upper arm, initial encounter: Secondary | ICD-10-CM | POA: Diagnosis present

## 2016-06-17 DIAGNOSIS — Y999 Unspecified external cause status: Secondary | ICD-10-CM | POA: Insufficient documentation

## 2016-06-17 MED ORDER — OXYCODONE-ACETAMINOPHEN 5-325 MG PO TABS
1.0000 | ORAL_TABLET | Freq: Once | ORAL | Status: AC
  Administered 2016-06-17: 1 via ORAL
  Filled 2016-06-17: qty 1

## 2016-06-17 MED ORDER — OXYCODONE-ACETAMINOPHEN 5-325 MG PO TABS: 1.0000 | ORAL_TABLET | ORAL | 0 refills | Status: DC | PRN

## 2016-06-17 MED ORDER — CYCLOBENZAPRINE HCL 10 MG PO TABS: 10.0000 mg | ORAL_TABLET | Freq: Two times a day (BID) | ORAL | 0 refills | Status: DC | PRN

## 2016-06-17 NOTE — ED Notes (Signed)
Pt verbalized understanding discharge instructions and denies any further needs or questions at this time. VS stable, ambulatory and steady gait.   

## 2016-06-17 NOTE — ED Provider Notes (Signed)
[facility] DEPT Provider Note   CSN: [id number] Arrival date & time: 06/17/16  1854     History   Chief Complaint Chief Complaint  Patient presents with  . Fall    HPI Savannah [name] is a 61 y.o. female.  Patient fell after she slipped in the shower earlier today. She hit her right upper arm and landed in a sitting position which exacerbated her chronic lower back pain. She reports tingling into bilateral thighs without weakness. No urinary or bowel incontinence. She denies head injury, abdominal or chest pain. No pain with breathing or SOB.   The history is provided by the patient. No language interpreter was used.  Fall  Pertinent negatives include no chest pain, no abdominal pain and no shortness of breath.    Past Medical History:  Diagnosis Date  . ALLERGIC RHINITIS CAUSE UNSPECIFIED 12/29/2007  . ANEMIA, IRON DEFICIENCY 12/06/2008  . 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 ASTHMA 09/27/2006  . ASYMPTOMATIC POSTMENOPAUSAL STATUS 03/19/2008  . BACK PAIN, LUMBAR 12/29/2009  . Chronic kidney disease   . Depression   . FATTY LIVER DISEASE 03/19/2008  . HYPERCHOLESTEROLEMIA 04/18/2009  . HYPERTENSION 09/27/2006  . HYPOKALEMIA 01/15/2007  . OBSTRUCTIVE SLEEP APNEA 08/09/2009  . 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 Palpitations 05/23/2009  . Sleep apnea    wears a c-pap- settings at 10   . Unspecified hypothyroidism 01/07/2007  . URINARY CALCULUS 11/08/2009    Patient Active Problem List   Diagnosis Date Noted  . Achilles tendinitis, left leg 04/26/2016  . Ankle pain, left 04/17/2016  . UTI (urinary tract infection) 04/17/2016  . Contusion of knee 01/06/2016  . Foot contusion 01/06/2016  . Gastric peptic ulcer 04/15/2015  . Cardiomegaly 01/14/2015  . Postoperative anemia due to acute blood loss 06/25/2014  . Renal mass 05/19/2014  . Dyspnea 10/28/2013  . Renal insufficiency 08/07/2013  . Hypokalemia 03/11/2013  . Sinusitis, acute 02/04/2013  . Right wrist pain 08/26/2012  .  Neoplasm of uncertain behavior of skin 08/26/2012  . Routine general medical examination at a health care facility 08/02/2012  . Nausea with vomiting 08/01/2012  . Abdominal pain, epigastric 01/15/2012  . Vertigo 07/24/2011  . Vitamin D deficiency 12/09/2010  . Hyperparathyroidism ([place]) 12/06/2010  . Dysuria 10/23/2010  . Cough 10/23/2010  . Muscle weakness 07/14/2010  . ARTHRPATH W/OTH ENDOCRINE&METABOLIC DISORDERS 94/85/4627  . BACK PAIN, LUMBAR 12/29/2009  . URINARY CALCULUS 11/08/2009  . Obstructive sleep apnea 08/09/2009  . HEMATURIA UNSPECIFIED 06/28/2009  . OTITIS MEDIA, LEFT 06/14/2009  . Palpitations 05/23/2009  . HYPERCHOLESTEROLEMIA 04/18/2009  . 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 CHEST PAIN 04/18/2009  . Iron deficiency anemia 12/06/2008  . DIZZINESS 12/06/2008  . NASH (nonalcoholic steatohepatitis) 03/19/2008  . ANKLE SPRAIN, RIGHT 03/19/2008  . Asymptomatic postmenopausal status 03/19/2008  . ALLERGIC RHINITIS CAUSE UNSPECIFIED 12/29/2007  . ASTHMATIC BRONCHITIS, ACUTE 08/16/2007  . NECK PAIN 06/18/2007  . Diabetes ([place]) 04/18/2007  . HYPOKALEMIA 01/15/2007  . Hypothyroidism 01/07/2007  . Essential hypertension 09/27/2006  . ASTHMA 09/27/2006    Past Surgical History:  Procedure Laterality Date  . CARDIAC CATHETERIZATION  2011  . compound fracture Right    arm  . KNEE ARTHROSCOPY Left 1993  . right foot surgery     . ROBOTIC ASSITED PARTIAL NEPHRECTOMY Right 05/19/2014   Procedure: ROBOTIC ASSITED PARTIAL NEPHRECTOMY;  Surgeon: Alexis Frock, MD;  Location: [facility];  Service: Urology;  Laterality: Right;    OB History    No data available  Home Medications    Prior to Admission medications   Medication Sig Start Date End Date Taking? Authorizing Provider  albuterol (PROAIR HFA) 108 (90 BASE) MCG/ACT inhaler INHALE 2 PUFFS EVERY 4 HOURS AS NEEDED FOR WHEEZING, COUGH & SHORTNESS OF BREATH 01/14/15   Renato Shin, MD  atorvastatin  (LIPITOR) 40 MG tablet TAKE 1 TABLET BY MOUTH (40MG  TOTAL) DAILY 01/22/16   Renato Shin, MD  cefUROXime (CEFTIN) 500 MG tablet Take 1 tablet (500 mg total) by mouth 2 (two) times daily with a meal. 04/18/16   Renato Shin, MD  citalopram (CELEXA) 20 MG tablet Take 1 tablet (20 mg total) by mouth daily. 05/03/16   Renato Shin, MD  diclofenac sodium (VOLTAREN) 1 % GEL Apply 2 g topically 4 (four) times daily. 04/26/16   Naiping Ephriam Jenkins, MD  Fluticasone-Salmeterol (ADVAIR) 100-50 MCG/DOSE AEPB Inhale 1 puff into the lungs 2 (two) times daily. 01/14/15   Renato Shin, MD  gabapentin (NEURONTIN) 300 MG capsule TAKE 1 CAPSULE BY MOUTH (300MG  TOTAL) AT BEDTIME 03/15/16   Renato Shin, MD  glucose blood (ONETOUCH VERIO) test strip Use to check blood sugar 1 time per day. 10/19/15   Renato Shin, MD  hyoscyamine (LEVSIN SL) 0.125 MG SL tablet Take 1-2 tablets by mouth or under tongue every 4 hours as needed. 01/14/15   Renato Shin, MD  levothyroxine (SYNTHROID, LEVOTHROID) 125 MCG tablet TAKE 1 TABLET BY MOUTH EVERY DAY BEFORE BREAKFAST 01/22/16   Renato Shin, MD  losartan-hydrochlorothiazide (HYZAAR) 100-12.5 MG tablet Take 1 tablet by mouth daily. 04/02/16   Renato Shin, MD  metFORMIN (GLUCOPHAGE-XR) 500 MG 24 hr tablet TAKE 2 TABLETS BY MOUTH (1000MG  TOTAL) TWICE DAILY 03/27/16   Renato Shin, MD  metoprolol succinate (TOPROL-XL) 25 MG 24 hr tablet TAKE 1 TABLET BY MOUTH (25MG  TOTAL) DAILY 01/13/16   Renato Shin, MD  omeprazole (PRILOSEC) 40 MG capsule Take 1 capsule (40 mg total) by mouth daily. 05/03/16   Renato Shin, MD  ondansetron (ZOFRAN) 4 MG tablet Take 1 tablet (4 mg total) by mouth every 6 (six) hours as needed for nausea. 01/14/15   Renato Shin, MD  [facility] DELICA LANCETS FINE MISC Use to check blood sugar 1 time per day. 10/19/15   Renato Shin, MD  Oxycodone HCl 10 MG TABS Take 1-2 tablets (10-20 mg total) by mouth every 6 (six) hours as needed. 10/13/15   Renato Shin, MD  pioglitazone (ACTOS) 15 MG  tablet Take 1 tablet (15 mg total) by mouth daily. 10/13/15   Renato Shin, MD  traZODone (DESYREL) 100 MG tablet TAKE 1 TABLET BY MOUTH (100MG  TOTAL) AT BEDTIME 01/22/16   Renato Shin, MD    Family History Family History  Problem Relation Age of Onset  . Colon cancer Mother 10  . Diabetes Mother   . Kidney disease Mother   . Heart attack Father   . Heart disease Father   . Allergies Sister   . Asthma Sister   . Clotting disorder Grandchild   . Heart disease Paternal Grandmother   . Esophageal cancer Neg Hx   . Rectal cancer Neg Hx   . Stomach cancer Neg Hx     Social History Social History  Substance Use Topics  . Smoking status: Former Smoker    Packs/day: 0.30    Years: 2.00    Types: Cigarettes    Quit date: 02/27/1975  . Smokeless tobacco: Never Used  . Alcohol use No     Allergies  Erythromycin   Review of Systems Review of Systems  Constitutional: Negative for chills and fever.  HENT: Negative.   Respiratory: Negative.  Negative for shortness of breath.   Cardiovascular: Negative.  Negative for chest pain.  Gastrointestinal: Negative.  Negative for abdominal pain and nausea.  Musculoskeletal:       See HPI.  Skin: Negative.  Negative for wound.  Neurological: Negative.  Negative for dizziness.     Physical Exam Updated Vital Signs BP (!) 132/59   Pulse 70   Temp 97.8 F (36.6 C) (Oral)   Resp 16   Ht 5\' 7"  (1.702 m)   Wt 105.2 kg   SpO2 99%   BMI 36.34 kg/m   Physical Exam  Constitutional: She is oriented to person, place, and time. She appears well-developed and well-nourished.  HENT:  Head: Normocephalic.  Neck: Normal range of motion. Neck supple.  Cardiovascular: Normal rate and regular rhythm.   Pulmonary/Chest: Effort normal and breath sounds normal. She exhibits no tenderness.  Abdominal: Soft. Bowel sounds are normal. There is no tenderness. There is no rebound and no guarding.  Musculoskeletal: Normal range of motion.  Right  upper arm has a bruise at lateral midshaft area. No bony tenderness. Shoulder and elbow non-tender. FROM.     Neurological: She is alert and oriented to person, place, and time.  Skin: Skin is warm and dry. No rash noted.  Psychiatric: She has a normal mood and affect.     ED Treatments / Results  Labs (all labs ordered are listed, but only abnormal results are displayed) Labs Reviewed - No data to display  EKG  EKG Interpretation None       Radiology No results found.  Procedures Procedures (including critical care time)  Medications Ordered in ED Medications  oxyCODONE-acetaminophen (PERCOCET/ROXICET) 5-325 MG per tablet 1 tablet (1 tablet Oral Given 06/17/16 2036)     Initial Impression / Assessment and Plan / ED Course  I have reviewed the triage vital signs and the nursing notes.  Pertinent labs & imaging results that were available during my care of the patient were reviewed by me and considered in my medical decision making (see chart for details).     Patient presents after fall with lower back and right upper arm pain. No head injury. No chest or abdominal pain.   Imaging of arm and back are negative for acute findings. She can be discharged home safely with family members present. She will see her PCP prn.  Final Clinical Impressions(s) / ED Diagnoses   Final diagnoses:  None   1. Fall 2. Low back pain 3. Right arm pain  New Prescriptions New Prescriptions   No medications on file     Charlann Lange, Hershal Coria 06/22/16 0230    Leo Grosser, MD 06/25/16 [phone]

## 2016-06-17 NOTE — ED Triage Notes (Addendum)
Pt states while in the shower @ 1530 today she slipped on bath mat falling into tub, pt c/o R upper pain, bruising and swelling noted, distal CMS intact. Upper and lower back pain, L heel/ankle pain, pt states she has hx of back pain but since fall experiencing numbness to L leg, CMS intact. Denies bladder/bowel incontinence. Pt denies head injury, denies LOC.

## 2016-06-17 NOTE — Discharge Instructions (Signed)
Ice to the sore areas for the first 48 hours then alternate heat with ice. Take medications as prescribed. Return here as needed for worsening symptoms.

## 2016-06-17 NOTE — ED Notes (Signed)
Patient transported to X-ray 

## 2016-07-25 ENCOUNTER — Other Ambulatory Visit: Payer: Self-pay

## 2016-07-25 MED ORDER — METFORMIN HCL ER 500 MG PO TB24: ORAL_TABLET | ORAL | 2 refills | Status: DC

## 2016-10-09 ENCOUNTER — Other Ambulatory Visit: Payer: Self-pay | Admitting: Endocrinology

## 2016-10-15 ENCOUNTER — Ambulatory Visit (INDEPENDENT_AMBULATORY_CARE_PROVIDER_SITE_OTHER): Payer: Managed Care, Other (non HMO) | Admitting: Endocrinology

## 2016-10-15 ENCOUNTER — Encounter: Payer: Self-pay | Admitting: Endocrinology

## 2016-10-15 VITALS — BP 136/82 | HR 62 | Wt 230.6 lb

## 2016-10-15 DIAGNOSIS — D509 Iron deficiency anemia, unspecified: Secondary | ICD-10-CM

## 2016-10-15 DIAGNOSIS — E213 Hyperparathyroidism, unspecified: Secondary | ICD-10-CM

## 2016-10-15 DIAGNOSIS — E559 Vitamin D deficiency, unspecified: Secondary | ICD-10-CM

## 2016-10-15 DIAGNOSIS — E039 Hypothyroidism, unspecified: Secondary | ICD-10-CM

## 2016-10-15 DIAGNOSIS — E119 Type 2 diabetes mellitus without complications: Secondary | ICD-10-CM | POA: Diagnosis not present

## 2016-10-15 LAB — CBC WITH DIFFERENTIAL/PLATELET
BASOS PCT: 0.3 % (ref 0.0–3.0)
Basophils Absolute: 0 10*3/uL (ref 0.0–0.1)
EOS ABS: 0.2 10*3/uL (ref 0.0–0.7)
Eosinophils Relative: 2.9 % (ref 0.0–5.0)
HEMATOCRIT: 35.9 % — AB (ref 36.0–46.0)
Hemoglobin: 12.1 g/dL (ref 12.0–15.0)
LYMPHS PCT: 29.1 % (ref 12.0–46.0)
Lymphs Abs: 1.9 10*3/uL (ref 0.7–4.0)
MCHC: 33.7 g/dL (ref 30.0–36.0)
MCV: 89.8 fl (ref 78.0–100.0)
MONO ABS: 0.3 10*3/uL (ref 0.1–1.0)
Monocytes Relative: 5.2 % (ref 3.0–12.0)
NEUTROS ABS: 4 10*3/uL (ref 1.4–7.7)
Neutrophils Relative %: 62.5 % (ref 43.0–77.0)
PLATELETS: 205 10*3/uL (ref 150.0–400.0)
RBC: 4 Mil/uL (ref 3.87–5.11)
RDW: 14.4 % (ref 11.5–15.5)
WBC: 6.5 10*3/uL (ref 4.0–10.5)

## 2016-10-15 LAB — BASIC METABOLIC PANEL
BUN: 22 mg/dL (ref 6–23)
CALCIUM: 9.8 mg/dL (ref 8.4–10.5)
CO2: 25 meq/L (ref 19–32)
CREATININE: 1.09 mg/dL (ref 0.40–1.20)
Chloride: 105 mEq/L (ref 96–112)
GFR: 54.03 mL/min — ABNORMAL LOW (ref >60.00)
Glucose, Bld: 113 mg/dL — ABNORMAL HIGH (ref 70–99)
Potassium: 3.8 mEq/L (ref 3.5–5.1)
Sodium: 140 mEq/L (ref 135–145)

## 2016-10-15 LAB — TSH: TSH: 3.82 u[IU]/mL (ref 0.35–4.50)

## 2016-10-15 LAB — IBC PANEL
Iron: 88 ug/dL (ref 42–145)
Saturation Ratios: 21.2 % (ref 20.0–50.0)
Transferrin: 297 mg/dL (ref 212.0–360.0)

## 2016-10-15 LAB — POCT GLYCOSYLATED HEMOGLOBIN (HGB A1C): HEMOGLOBIN A1C: 5.6

## 2016-10-15 LAB — VITAMIN D 25 HYDROXY (VIT D DEFICIENCY, FRACTURES): VITD: 64.56 ng/mL (ref 30.00–100.00)

## 2016-10-15 MED ORDER — CEFUROXIME AXETIL 250 MG PO TABS: 250.0000 mg | ORAL_TABLET | Freq: Two times a day (BID) | ORAL | 0 refills | Status: DC

## 2016-10-15 NOTE — Patient Instructions (Addendum)
blood tests are requested for you today.  We'll let you know about the results. I have sent a prescription to your pharmacy, for an antibiotic pill. Please come back for a regular physical appointment in 6 months (must be after 04/17/17).

## 2016-10-15 NOTE — Progress Notes (Signed)
Subjective:    Patient ID: Savannah [name], female    DOB: Mar 07, 1955, 61 y.o.   MRN: [id number]  HPI Pt returns for f/u of diabetes mellitus: DM type: 2 Dx'ed: 3382 Complications: none Therapy: metformin GDM: never DKA: never Severe hypoglycemia: never Pancreatitis: never Pancreatic imaging: normal on 2013 [place] Other: she has never been on insulin. Interval history: pt states she feels well in general.  She takes metformin as rx'ed.  Past Medical History:  Diagnosis Date  . ALLERGIC RHINITIS CAUSE UNSPECIFIED 12/29/2007  . ANEMIA, IRON DEFICIENCY 12/06/2008  . 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 ASTHMA 09/27/2006  . ASYMPTOMATIC POSTMENOPAUSAL STATUS 03/19/2008  . BACK PAIN, LUMBAR 12/29/2009  . Chronic kidney disease   . Depression   . FATTY LIVER DISEASE 03/19/2008  . HYPERCHOLESTEROLEMIA 04/18/2009  . HYPERTENSION 09/27/2006  . HYPOKALEMIA 01/15/2007  . OBSTRUCTIVE SLEEP APNEA 08/09/2009  . 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 Palpitations 05/23/2009  . Sleep apnea    wears a c-pap- settings at 10   . Unspecified hypothyroidism 01/07/2007  . URINARY CALCULUS 11/08/2009    Past Surgical History:  Procedure Laterality Date  . CARDIAC CATHETERIZATION  2011  . compound fracture Right    arm  . KNEE ARTHROSCOPY Left 1993  . right foot surgery     . ROBOTIC ASSITED PARTIAL NEPHRECTOMY Right 05/19/2014   Procedure: ROBOTIC ASSITED PARTIAL NEPHRECTOMY;  Surgeon: Alexis Frock, MD;  Location: [facility];  Service: Urology;  Laterality: Right;    Social History   Social History  . Marital status: Married    Spouse name: N/A  . Number of children: 2  . Years of education: N/A   Occupational History  . Homemaker Unemployed   Social History Main Topics  . Smoking status: Former Smoker    Packs/day: 0.30    Years: 2.00    Types: Cigarettes    Quit date: 02/27/1975  . Smokeless tobacco: Never Used  . Alcohol use No  . Drug use: No  . Sexual activity: Not on file   Other Topics  Concern  . 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 albuterol (PROAIR HFA) 108 (90 BASE) MCG/ACT inhaler INHALE 2 PUFFS EVERY 4 HOURS AS NEEDED FOR WHEEZING, COUGH & SHORTNESS OF BREATH (Patient taking differently: Inhale 2 puffs into the lungs every 4 (four) hours as needed for wheezing or shortness of breath (cough). ) 8.5 Inhaler 11  . atorvastatin (LIPITOR) 40 MG tablet TAKE 1 TABLET BY MOUTH (40MG  TOTAL) DAILY 90 tablet 2  . cholecalciferol (VITAMIN D) 1000 units tablet Take 1,000 Units by mouth 2 (two) times daily.    . citalopram (CELEXA) 20 MG tablet Take 1 tablet (20 mg total) by mouth daily. 90 tablet 3  . cyclobenzaprine (FLEXERIL) 10 MG tablet Take 1 tablet (10 mg total) by mouth 2 (two) times daily as needed for muscle spasms. 20 tablet 0  . diclofenac sodium (VOLTAREN) 1 % GEL Apply 2 g topically 4 (four) times daily. 1 Tube 5  . ferrous sulfate 325 (65 FE) MG tablet Take 325 mg by mouth daily with breakfast.    . Fluticasone-Salmeterol (ADVAIR) 100-50 MCG/DOSE AEPB Inhale 1 puff into the lungs 2 (two) times daily. 1 each 11  . gabapentin (NEURONTIN) 300 MG capsule TAKE 1 CAPSULE BY MOUTH (300MG  TOTAL) AT BEDTIME  90 capsule 2  . glucose blood (ONETOUCH VERIO) test strip Use to check blood sugar 1 time per day. 100 each 3  . hyoscyamine (LEVSIN SL) 0.125 MG SL tablet Take 1-2 tablets by mouth or under tongue every 4 hours as needed. (Patient taking differently: Take 0.125-0.25 mg by mouth every 4 (four) hours as needed for cramping (stomach spasms). ) 100 tablet 5  . levothyroxine (SYNTHROID, LEVOTHROID) 125 MCG tablet TAKE 1 TABLET BY MOUTH EVERY DAY BEFORE BREAKFAST (Patient taking differently: TAKE 1 TABLET BY MOUTH EVERY DAY) 90 tablet 2  . losartan-hydrochlorothiazide (HYZAAR) 100-12.5 MG tablet Take 1 tablet by mouth daily. 90 tablet 3  .  metFORMIN (GLUCOPHAGE-XR) 500 MG 24 hr tablet TAKE 2 TABLETS BY MOUTH (1000MG  TOTAL) TWICE DAILY 360 tablet 2  . metoprolol succinate (TOPROL-XL) 25 MG 24 hr tablet TAKE 1 TABLET BY MOUTH (25MG  TOTAL) DAILY (Patient taking differently: Take 25 mg by mouth daily. ) 90 tablet 2  . omeprazole (PRILOSEC) 40 MG capsule Take 1 capsule (40 mg total) by mouth daily. 90 capsule 2  . ondansetron (ZOFRAN) 4 MG tablet Take 1 tablet (4 mg total) by mouth every 6 (six) hours as needed for nausea. (Patient taking differently: Take 4 mg by mouth every 6 (six) hours as needed for nausea or vomiting. ) 30 tablet 11  . ONETOUCH DELICA LANCETS FINE MISC Use to check blood sugar 1 time per day. 100 each 2  . pioglitazone (ACTOS) 15 MG tablet TAKE 1 TABLET BY MOUTH EVERY DAY 90 tablet 0  . PRESCRIPTION MEDICATION Inhale into the lungs at bedtime. CPAP    . traZODone (DESYREL) 100 MG tablet TAKE 1 TABLET BY MOUTH (100MG  TOTAL) AT BEDTIME 90 tablet 2   No current facility-administered medications on file prior to visit.     Allergies  Allergen Reactions  . Erythromycin Nausea And Vomiting    Family History  Problem Relation Age of Onset  . Colon cancer Mother 18  . Diabetes Mother   . Kidney disease Mother   . Heart attack Father   . Heart disease Father   . Allergies Sister   . Asthma Sister   . Clotting disorder Grandchild   . Heart disease Paternal Grandmother   . Esophageal cancer Neg Hx   . Rectal cancer Neg Hx   . Stomach cancer Neg Hx     BP 136/82   Pulse 62   Wt 230 lb 9.6 oz (104.6 kg)   SpO2 96%   BMI 36.12 kg/m    Review of Systems She has fatigue and right ear pain    Objective:   Physical Exam VITAL SIGNS:  See vs page GENERAL: no distress Ears: right TM is red Pulses: foot pulses are intact bilaterally.   MSK: no deformity of the feet or ankles.  CV: no edema of the legs or ankles Skin:  no ulcer on the feet or ankles.  normal color and temp on the feet and ankles. Old  healed surgical scars on the right toes. Neuro: sensation is intact to touch on the feet and ankles, but decreased from normal Ext: There is bilateral onychomycosis of the toenails   A1c=5.6% Lab Results  Component Value Date   CREATININE 1.09 10/15/2016   BUN 22 10/15/2016   NA 140 10/15/2016   K 3.8 10/15/2016   CL 105 10/15/2016   CO2 25 10/15/2016      Assessment & Plan:  AOM: new type 2 DM: well-controlled  Patient Instructions  blood tests are requested for you today.  We'll let you know about the results. I have sent a prescription to your pharmacy, for an antibiotic pill. Please come back for a regular physical appointment in 6 months (must be after 04/17/17).

## 2016-10-16 LAB — PTH, INTACT AND CALCIUM
CALCIUM: 9.8 mg/dL (ref 8.6–10.4)
PTH: 22 pg/mL (ref 14–64)

## 2016-10-29 ENCOUNTER — Other Ambulatory Visit: Payer: Self-pay | Admitting: Endocrinology

## 2016-10-29 DIAGNOSIS — E119 Type 2 diabetes mellitus without complications: Secondary | ICD-10-CM

## 2016-11-16 ENCOUNTER — Other Ambulatory Visit: Payer: Self-pay | Admitting: Endocrinology

## 2016-11-20 ENCOUNTER — Other Ambulatory Visit: Payer: Self-pay | Admitting: Endocrinology

## 2016-12-21 ENCOUNTER — Other Ambulatory Visit: Payer: Self-pay | Admitting: Endocrinology

## 2017-01-04 ENCOUNTER — Other Ambulatory Visit: Payer: Self-pay | Admitting: Endocrinology

## 2017-02-01 ENCOUNTER — Other Ambulatory Visit: Payer: Self-pay | Admitting: Endocrinology

## 2017-02-01 DIAGNOSIS — E119 Type 2 diabetes mellitus without complications: Secondary | ICD-10-CM

## 2017-02-28 ENCOUNTER — Encounter: Payer: Self-pay | Admitting: Endocrinology

## 2017-02-28 ENCOUNTER — Other Ambulatory Visit: Payer: Self-pay

## 2017-02-28 ENCOUNTER — Telehealth: Payer: Self-pay | Admitting: Endocrinology

## 2017-02-28 ENCOUNTER — Ambulatory Visit: Payer: BLUE CROSS/BLUE SHIELD | Admitting: Endocrinology

## 2017-02-28 VITALS — BP 138/76 | HR 70 | Wt 244.4 lb

## 2017-02-28 DIAGNOSIS — E119 Type 2 diabetes mellitus without complications: Secondary | ICD-10-CM | POA: Diagnosis not present

## 2017-02-28 LAB — POCT GLYCOSYLATED HEMOGLOBIN (HGB A1C): Hemoglobin A1C: 5.9

## 2017-02-28 MED ORDER — CEFUROXIME AXETIL 250 MG PO TABS: 250.0000 mg | ORAL_TABLET | Freq: Two times a day (BID) | ORAL | 0 refills | Status: AC

## 2017-02-28 MED ORDER — PROMETHAZINE-CODEINE 6.25-10 MG/5ML PO SYRP: 5.0000 mL | ORAL_SOLUTION | ORAL | 0 refills | Status: DC | PRN

## 2017-02-28 MED ORDER — CEFUROXIME AXETIL 250 MG PO TABS: 250.0000 mg | ORAL_TABLET | Freq: Two times a day (BID) | ORAL | 0 refills | Status: DC

## 2017-02-28 NOTE — Progress Notes (Signed)
Subjective:    Patient ID: Savannah [name], female    DOB: [birth year], 62 y.o.   MRN: [id number]  HPI Pt states 10 days of slight pain at the throat, and assoc dry-quality cough.   Past Medical History:  Diagnosis Date  . ALLERGIC RHINITIS CAUSE UNSPECIFIED 12/29/2007  . ANEMIA, IRON DEFICIENCY 12/06/2008  . 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 ASTHMA 09/27/2006  . ASYMPTOMATIC POSTMENOPAUSAL STATUS 03/19/2008  . BACK PAIN, LUMBAR 12/29/2009  . Chronic kidney disease   . Depression   . FATTY LIVER DISEASE 03/19/2008  . HYPERCHOLESTEROLEMIA 04/18/2009  . HYPERTENSION 09/27/2006  . HYPOKALEMIA 01/15/2007  . OBSTRUCTIVE SLEEP APNEA 08/09/2009  . 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 Palpitations 05/23/2009  . Sleep apnea    wears a c-pap- settings at 10   . Unspecified hypothyroidism 01/07/2007  . URINARY CALCULUS 11/08/2009    Past Surgical History:  Procedure Laterality Date  . CARDIAC CATHETERIZATION  2011  . compound fracture Right    arm  . KNEE ARTHROSCOPY Left 1993  . right foot surgery     . ROBOTIC ASSITED PARTIAL NEPHRECTOMY Right 05/19/2014   Procedure: ROBOTIC ASSITED PARTIAL NEPHRECTOMY;  Surgeon: Alexis Frock, MD;  Location: [facility];  Service: Urology;  Laterality: Right;    Social History   Socioeconomic History  . Marital status: Married    Spouse name: Not on file  . Number of children: 2  . Years of education: Not on file  . Highest education level: Not on file  Social Needs  . Financial resource strain: Not on file  . Food insecurity - worry: Not on file  . Food insecurity - inability: Not on file  . Transportation needs - medical: Not on file  . Transportation needs - non-medical: Not on file  Occupational History  . Occupation: Scientist, research (physical sciences): UNEMPLOYED  Tobacco Use  . Smoking status: Former Smoker    Packs/day: 0.30    Years: 2.00    Pack years: 0.60    Types: Cigarettes    Last attempt to quit: 02/27/1975    Years since quitting: 42.0  . Smokeless  tobacco: Never Used  Substance and Sexual Activity  . Alcohol use: No  . Drug use: No  . Sexual activity: Not on file  Other Topics Concern  . 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 albuterol (PROAIR HFA) 108 (90 BASE) MCG/ACT inhaler INHALE 2 PUFFS EVERY 4 HOURS AS NEEDED FOR WHEEZING, COUGH & SHORTNESS OF BREATH (Patient taking differently: Inhale 2 puffs into the lungs every 4 (four) hours as needed for wheezing or shortness of breath (cough). ) 8.5 Inhaler 11  . atorvastatin (LIPITOR) 40 MG tablet TAKE 1 TABLET BY MOUTH EVERY DAY 90 tablet 1  . cholecalciferol (VITAMIN D) 1000 units tablet Take 1,000 Units by mouth 2 (two) times daily.    . citalopram (CELEXA) 20 MG tablet Take 1 tablet (20 mg total) by mouth daily. 90 tablet 3  . diclofenac sodium (VOLTAREN) 1 % GEL Apply 2 g topically 4 (four) times daily. 1 Tube 5  . ferrous sulfate 325 (65 FE) MG tablet Take 325 mg by mouth daily with breakfast.    . Fluticasone-Salmeterol (ADVAIR) 100-50 MCG/DOSE AEPB Inhale 1 puff into the lungs 2 (two) times daily. 1 each 11  . gabapentin (NEURONTIN)  300 MG capsule TAKE ONE CAPSULE BY MOUTH AT BEDTIME 90 capsule 1  . glucose blood (ONETOUCH VERIO) test strip Use to check blood sugar 1 time per day. 100 each 3  . hyoscyamine (LEVSIN SL) 0.125 MG SL tablet Take 1-2 tablets by mouth or under tongue every 4 hours as needed. (Patient taking differently: Take 0.125-0.25 mg by mouth every 4 (four) hours as needed for cramping (stomach spasms). ) 100 tablet 5  . levothyroxine (SYNTHROID, LEVOTHROID) 125 MCG tablet TAKE 1 TABLET BY MOUTH EVERY DAY BEFORE BREAKFAST 90 tablet 1  . losartan-hydrochlorothiazide (HYZAAR) 100-12.5 MG tablet Take 1 tablet by mouth daily. 90 tablet 3  . metFORMIN (GLUCOPHAGE-XR) 500 MG 24 hr tablet TAKE 2 TABLETS BY MOUTH (1000MG  TOTAL) TWICE  DAILY 360 tablet 2  . metoprolol succinate (TOPROL-XL) 25 MG 24 hr tablet TAKE 1 TABLET BY MOUTH EVERY DAY 90 tablet 0  . ondansetron (ZOFRAN) 4 MG tablet Take 1 tablet (4 mg total) by mouth every 6 (six) hours as needed for nausea. (Patient taking differently: Take 4 mg by mouth every 6 (six) hours as needed for nausea or vomiting. ) 30 tablet 11  . ONETOUCH DELICA LANCETS FINE MISC Use to check blood sugar 1 time per day. 100 each 2  . pioglitazone (ACTOS) 15 MG tablet TAKE 1 TABLET BY MOUTH EVERY DAY 90 tablet 0  . PRESCRIPTION MEDICATION Inhale into the lungs at bedtime. CPAP    . traZODone (DESYREL) 100 MG tablet TAKE 1 TABLET BY MOUTH EVERY DAY AT BEDTIME 90 tablet 1  . cyclobenzaprine (FLEXERIL) 10 MG tablet Take 1 tablet (10 mg total) by mouth 2 (two) times daily as needed for muscle spasms. (Patient not taking: Reported on 02/28/2017) 20 tablet 0  . omeprazole (PRILOSEC) 40 MG capsule Take 1 capsule (40 mg total) by mouth daily. (Patient not taking: Reported on 02/28/2017) 90 capsule 2   No current facility-administered medications on file prior to visit.     Allergies  Allergen Reactions  . Erythromycin Nausea And Vomiting    Family History  Problem Relation Age of Onset  . Colon cancer Mother 27  . Diabetes Mother   . Kidney disease Mother   . Heart attack Father   . Heart disease Father   . Allergies Sister   . Asthma Sister   . Clotting disorder Grandchild   . Heart disease Paternal Grandmother   . Esophageal cancer Neg Hx   . Rectal cancer Neg Hx   . Stomach cancer Neg Hx     BP 138/76 (BP Location: Left Arm, Patient Position: Sitting, Cuff Size: Normal)   Pulse 70   Wt 244 lb 6.4 oz (110.9 kg)   SpO2 95%   BMI 38.28 kg/m    Review of Systems Denies fever, earache, and sob.      Objective:   Physical Exam VITAL SIGNS:  See vs page GENERAL: no distress head: no deformity.  eyes: no periorbital swelling, no proptosis  external nose and ears are normal    mouth: no lesion seen.  Both eac's and tm's are normal.  LUNGS:  Clear to auscultation.  Lab Results  Component Value Date   HGBA1C 5.9 02/28/2017      Assessment & Plan:  URI, new Type 2 DM: well-controlled   Patient Instructions  I have sent 2 prescriptions to your pharmacy: antibiotic and cough syrup.   Loratadine-d (non-prescription) will help your congestion.   I hope you feel better soon.  If you don't feel better by next week, please call back.  Please call sooner if you get worse.   Please continue the same medication for diabetes.

## 2017-02-28 NOTE — Patient Instructions (Addendum)
I have sent 2 prescriptions to your pharmacy: antibiotic and cough syrup.   Loratadine-d (non-prescription) will help your congestion.   I hope you feel better soon.  If you don't feel better by next week, please call back.  Please call sooner if you get worse.   Please continue the same medication for diabetes.

## 2017-03-01 NOTE — Telephone Encounter (Signed)
error 

## 2017-04-17 ENCOUNTER — Ambulatory Visit: Payer: Managed Care, Other (non HMO) | Admitting: Endocrinology

## 2017-04-17 ENCOUNTER — Other Ambulatory Visit: Payer: Self-pay | Admitting: Endocrinology

## 2017-04-17 VITALS — BP 106/74 | HR 66 | Ht 68.5 in | Wt 244.4 lb

## 2017-04-17 DIAGNOSIS — D509 Iron deficiency anemia, unspecified: Secondary | ICD-10-CM

## 2017-04-17 DIAGNOSIS — Z0001 Encounter for general adult medical examination with abnormal findings: Secondary | ICD-10-CM | POA: Diagnosis not present

## 2017-04-17 DIAGNOSIS — Z23 Encounter for immunization: Secondary | ICD-10-CM | POA: Diagnosis not present

## 2017-04-17 DIAGNOSIS — I10 Essential (primary) hypertension: Secondary | ICD-10-CM | POA: Diagnosis not present

## 2017-04-17 DIAGNOSIS — E039 Hypothyroidism, unspecified: Secondary | ICD-10-CM

## 2017-04-17 DIAGNOSIS — E119 Type 2 diabetes mellitus without complications: Secondary | ICD-10-CM | POA: Diagnosis not present

## 2017-04-17 DIAGNOSIS — Z0000 Encounter for general adult medical examination without abnormal findings: Secondary | ICD-10-CM

## 2017-04-17 DIAGNOSIS — R200 Anesthesia of skin: Secondary | ICD-10-CM

## 2017-04-17 DIAGNOSIS — R42 Dizziness and giddiness: Secondary | ICD-10-CM

## 2017-04-17 LAB — URINALYSIS, ROUTINE W REFLEX MICROSCOPIC
Bilirubin Urine: NEGATIVE
Ketones, ur: NEGATIVE
Nitrite: POSITIVE — AB
SPECIFIC GRAVITY, URINE: 1.02 (ref 1.000–1.030)
Total Protein, Urine: NEGATIVE
Urine Glucose: NEGATIVE
Urobilinogen, UA: 0.2 (ref 0.0–1.0)
pH: 5.5 (ref 5.0–8.0)

## 2017-04-17 LAB — LIPID PANEL
CHOL/HDL RATIO: 3
Cholesterol: 146 mg/dL (ref 0–200)
HDL: 52.2 mg/dL (ref >39.00)
LDL Cholesterol: 61 mg/dL (ref 0–99)
NONHDL: 93.66
Triglycerides: 165 mg/dL — ABNORMAL HIGH (ref 0.0–149.0)
VLDL: 33 mg/dL (ref 0.0–40.0)

## 2017-04-17 LAB — IBC PANEL
Iron: 93 ug/dL (ref 42–145)
Saturation Ratios: 23 % (ref 20.0–50.0)
TRANSFERRIN: 289 mg/dL (ref 212.0–360.0)

## 2017-04-17 LAB — CBC WITH DIFFERENTIAL/PLATELET
Basophils Absolute: 0 10*3/uL (ref 0.0–0.1)
Basophils Relative: 0.7 % (ref 0.0–3.0)
Eosinophils Absolute: 0.2 10*3/uL (ref 0.0–0.7)
Eosinophils Relative: 3.8 % (ref 0.0–5.0)
HCT: 35.8 % — ABNORMAL LOW (ref 36.0–46.0)
Hemoglobin: 12.3 g/dL (ref 12.0–15.0)
LYMPHS ABS: 1.5 10*3/uL (ref 0.7–4.0)
Lymphocytes Relative: 29.1 % (ref 12.0–46.0)
MCHC: 34.3 g/dL (ref 30.0–36.0)
MCV: 88.7 fl (ref 78.0–100.0)
MONOS PCT: 5.2 % (ref 3.0–12.0)
Monocytes Absolute: 0.3 10*3/uL (ref 0.1–1.0)
NEUTROS ABS: 3.2 10*3/uL (ref 1.4–7.7)
NEUTROS PCT: 61.2 % (ref 43.0–77.0)
PLATELETS: 181 10*3/uL (ref 150.0–400.0)
RBC: 4.03 Mil/uL (ref 3.87–5.11)
RDW: 14 % (ref 11.5–15.5)
WBC: 5.3 10*3/uL (ref 4.0–10.5)

## 2017-04-17 LAB — MICROALBUMIN / CREATININE URINE RATIO
Creatinine,U: 129.7 mg/dL
Microalb Creat Ratio: 3.1 mg/g (ref 0.0–30.0)
Microalb, Ur: 4 mg/dL — ABNORMAL HIGH (ref 0.0–1.9)

## 2017-04-17 LAB — HEPATIC FUNCTION PANEL
ALBUMIN: 4 g/dL (ref 3.5–5.2)
ALK PHOS: 50 U/L (ref 39–117)
ALT: 14 U/L (ref 0–35)
AST: 14 U/L (ref 0–37)
Bilirubin, Direct: 0.2 mg/dL (ref 0.0–0.3)
TOTAL PROTEIN: 6.8 g/dL (ref 6.0–8.3)
Total Bilirubin: 1.5 mg/dL — ABNORMAL HIGH (ref 0.2–1.2)

## 2017-04-17 LAB — VITAMIN B12: Vitamin B-12: 194 pg/mL — ABNORMAL LOW (ref 211–911)

## 2017-04-17 LAB — TSH: TSH: 9.84 u[IU]/mL — AB (ref 0.35–4.50)

## 2017-04-17 MED ORDER — LEVOTHYROXINE SODIUM 137 MCG PO TABS: 137.0000 ug | ORAL_TABLET | Freq: Every day | ORAL | 3 refills | Status: DC

## 2017-04-17 MED ORDER — METOPROLOL SUCCINATE ER 25 MG PO TB24: 12.5000 mg | ORAL_TABLET | Freq: Every day | ORAL | 3 refills | Status: DC

## 2017-04-17 NOTE — Patient Instructions (Addendum)
blood tests are requested for you today.  We'll let you know about the results.  I have sent a prescription to your pharmacy, to reduce the metoprolol.  Please consider these measures for your health:  minimize alcohol.  Do not use tobacco products.  Have a colonoscopy at least every 10 years from age 63.  Women should have an annual mammogram from age 32.  Keep firearms safely stored.  Always use seat belts.  have working smoke alarms in your home.  See an eye doctor and dentist regularly.  Never drive under the influence of alcohol or drugs (including prescription drugs).  Those with fair skin should take precautions against the sun, and should carefully examine their skin once per month, for any new or changed moles.  Please come back for a follow-up appointment in 6 months.

## 2017-04-17 NOTE — Progress Notes (Signed)
Subjective:    Patient ID: Savannah [name], female    DOB: May 02, 1955, 62 y.o.   MRN: [id number]  HPI Pt is here for regular wellness examination, and is feeling pretty well in general, and says chronic med probs are stable, except as noted below Past Medical History:  Diagnosis Date  . ALLERGIC RHINITIS CAUSE UNSPECIFIED 12/29/2007  . ANEMIA, IRON DEFICIENCY 12/06/2008  . 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 ASTHMA 09/27/2006  . ASYMPTOMATIC POSTMENOPAUSAL STATUS 03/19/2008  . BACK PAIN, LUMBAR 12/29/2009  . Chronic kidney disease   . Depression   . FATTY LIVER DISEASE 03/19/2008  . HYPERCHOLESTEROLEMIA 04/18/2009  . HYPERTENSION 09/27/2006  . HYPOKALEMIA 01/15/2007  . OBSTRUCTIVE SLEEP APNEA 08/09/2009  . 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 Palpitations 05/23/2009  . Sleep apnea    wears a c-pap- settings at 10   . Unspecified hypothyroidism 01/07/2007  . URINARY CALCULUS 11/08/2009    Past Surgical History:  Procedure Laterality Date  . CARDIAC CATHETERIZATION  2011  . compound fracture Right    arm  . KNEE ARTHROSCOPY Left 1993  . right foot surgery     . ROBOTIC ASSITED PARTIAL NEPHRECTOMY Right 05/19/2014   Procedure: ROBOTIC ASSITED PARTIAL NEPHRECTOMY;  Surgeon: Alexis Frock, MD;  Location: [facility];  Service: Urology;  Laterality: Right;    Social History   Socioeconomic History  . Marital status: Married    Spouse name: Not on file  . Number of children: 2  . Years of education: Not on file  . Highest education level: Not on file  Social Needs  . Financial resource strain: Not on file  . Food insecurity - worry: Not on file  . Food insecurity - inability: Not on file  . Transportation needs - medical: Not on file  . Transportation needs - non-medical: Not on file  Occupational History  . Occupation: Scientist, research (physical sciences): UNEMPLOYED  Tobacco Use  . Smoking status: Former Smoker    Packs/day: 0.30    Years: 2.00    Pack years: 0.60    Types: Cigarettes    Last  attempt to quit: 02/27/1975    Years since quitting: 42.1  . Smokeless tobacco: Never Used  Substance and Sexual Activity  . Alcohol use: No  . Drug use: No  . Sexual activity: Not on file  Other Topics Concern  . 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 albuterol (PROAIR HFA) 108 (90 BASE) MCG/ACT inhaler INHALE 2 PUFFS EVERY 4 HOURS AS NEEDED FOR WHEEZING, COUGH & SHORTNESS OF BREATH (Patient taking differently: Inhale 2 puffs into the lungs every 4 (four) hours as needed for wheezing or shortness of breath (cough). ) 8.5 Inhaler 11  . atorvastatin (LIPITOR) 40 MG tablet TAKE 1 TABLET BY MOUTH EVERY DAY 90 tablet 1  . cholecalciferol (VITAMIN D) 1000 units tablet Take 1,000 Units by mouth 2 (two) times daily.    . citalopram (CELEXA) 20 MG tablet Take 1 tablet (20 mg total) by mouth daily. 90 tablet 3  . diclofenac sodium (VOLTAREN) 1 % GEL Apply 2 g topically 4 (four) times daily. 1 Tube 5  . ferrous sulfate 325 (65 FE) MG tablet Take 325 mg by mouth daily with breakfast.    . Fluticasone-Salmeterol (ADVAIR) 100-50 MCG/DOSE AEPB Inhale 1 puff into the lungs 2 (two)  times daily. 1 each 11  . gabapentin (NEURONTIN) 300 MG capsule TAKE ONE CAPSULE BY MOUTH AT BEDTIME 90 capsule 1  . glucose blood (ONETOUCH VERIO) test strip Use to check blood sugar 1 time per day. 100 each 3  . hyoscyamine (LEVSIN SL) 0.125 MG SL tablet Take 1-2 tablets by mouth or under tongue every 4 hours as needed. (Patient taking differently: Take 0.125-0.25 mg by mouth every 4 (four) hours as needed for cramping (stomach spasms). ) 100 tablet 5  . losartan-hydrochlorothiazide (HYZAAR) 100-12.5 MG tablet Take 1 tablet by mouth daily. 90 tablet 3  . metFORMIN (GLUCOPHAGE-XR) 500 MG 24 hr tablet TAKE 2 TABLETS BY MOUTH (1000MG  TOTAL) TWICE DAILY 360 tablet 2  . ONETOUCH DELICA LANCETS  FINE MISC Use to check blood sugar 1 time per day. 100 each 2  . PRESCRIPTION MEDICATION Inhale into the lungs at bedtime. CPAP    . traZODone (DESYREL) 100 MG tablet TAKE 1 TABLET BY MOUTH EVERY DAY AT BEDTIME 90 tablet 1   No current facility-administered medications on file prior to visit.     Allergies  Allergen Reactions  . Erythromycin Nausea And Vomiting    Family History  Problem Relation Age of Onset  . Colon cancer Mother 15  . Diabetes Mother   . Kidney disease Mother   . Heart attack Father   . Heart disease Father   . Allergies Sister   . Asthma Sister   . Clotting disorder Grandchild   . Heart disease Paternal Grandmother   . Esophageal cancer Neg Hx   . Rectal cancer Neg Hx   . Stomach cancer Neg Hx     BP 106/74 (BP Location: Left Arm, Patient Position: Sitting, Cuff Size: Large)   Pulse 66   Ht 5' 8.5" (1.74 m)   Wt 244 lb 6.4 oz (110.9 kg)   BMI 36.62 kg/m     Review of Systems Denies fever, visual loss, hearing loss, chest pain, sob, depression, cold intolerance, BRBPR, hematuria, headache, easy bruising, and rash.  No change in chronic low back pain, bilat foot numbness, and rhinorrhea.       Objective:   Physical Exam VS: see vs page GEN: no distress HEAD: head: no deformity eyes: no periorbital swelling, no proptosis external nose and ears are normal mouth: no lesion seen NECK: supple, thyroid is not enlarged CHEST WALL: no deformity.  LUNGS:  Clear to auscultation BREASTS: sees gyn ABD: abdomen is soft, nontender.  no hepatosplenomegaly.  not distended.  no hernia GENITALIA/RECTAL: sees gyn.  MUSCULOSKELETAL: muscle bulk and strength are grossly normal.  no obvious joint swelling.  gait is normal and steady EXTEMITIES: no deformity.  no ulcer on the feet.  feet are of normal color and temp.  no edema PULSES: dorsalis pedis intact bilat.  no carotid bruit NEURO:  cn 2-12 grossly intact.   readily moves all 4's.  sensation is intact to  touch on the feet SKIN:  Normal texture and temperature.  No rash or suspicious lesion is visible.   NODES:  None palpable at the neck PSYCH: alert, well-oriented.  Does not appear anxious nor depressed.   I personally reviewed electrocardiogram tracing (today): Indication: wellness Impression: NSR with occ PAC.  No MI.  Low voltage Compared to: no significant change     Assessment & Plan:  Wellness visit today, with problems stable, except as noted.    Patient Instructions  blood tests are requested for you today.  We'll let  you know about the results.  I have sent a prescription to your pharmacy, to reduce the metoprolol.  Please consider these measures for your health:  minimize alcohol.  Do not use tobacco products.  Have a colonoscopy at least every 10 years from age 14.  Women should have an annual mammogram from age 50.  Keep firearms safely stored.  Always use seat belts.  have working smoke alarms in your home.  See an eye doctor and dentist regularly.  Never drive under the influence of alcohol or drugs (including prescription drugs).  Those with fair skin should take precautions against the sun, and should carefully examine their skin once per month, for any new or changed moles.  Please come back for a follow-up appointment in 6 months.     SEPARATE EVALUATION FOLLOWS--EACH PROBLEM HERE IS NEW, NOT RESPONDING TO TREATMENT, OR POSES SIGNIFICANT RISK TO THE PATIENT'S HEALTH: HISTORY OF THE PRESENT ILLNESS: Pt states slight dizziness sensation in the head, but no assoc LOC PAST MEDICAL HISTORY  REVIEW OF SYSTEMS: She also has fatigue PHYSICAL EXAMINATION: VITAL SIGNS:  See vs page GENERAL: no distress HEART:  Regular rate and rhythm without murmurs noted. Normal S1,S2.     LAB/XRAY RESULTS: Lab Results  Component Value Date   TSH 9.84 (H) 04/17/2017   IMPRESSION: HTN: overcontrolled Hypothyroidism: she needs increased rx Fatigue: could be due to either of the  above PLAN:  Increase synthroid Decreased toprol

## 2017-04-18 ENCOUNTER — Ambulatory Visit (INDEPENDENT_AMBULATORY_CARE_PROVIDER_SITE_OTHER): Payer: BLUE CROSS/BLUE SHIELD

## 2017-04-18 DIAGNOSIS — E538 Deficiency of other specified B group vitamins: Secondary | ICD-10-CM | POA: Diagnosis not present

## 2017-04-18 MED ORDER — CYANOCOBALAMIN 1000 MCG/ML IJ SOLN
1000.0000 ug | Freq: Once | INTRAMUSCULAR | Status: AC
  Administered 2017-04-18: 1000 ug via INTRAMUSCULAR

## 2017-04-18 NOTE — Progress Notes (Signed)
yan

## 2017-04-26 ENCOUNTER — Other Ambulatory Visit: Payer: Self-pay | Admitting: Endocrinology

## 2017-05-05 ENCOUNTER — Other Ambulatory Visit: Payer: Self-pay | Admitting: Endocrinology

## 2017-05-05 DIAGNOSIS — E119 Type 2 diabetes mellitus without complications: Secondary | ICD-10-CM

## 2017-05-09 ENCOUNTER — Other Ambulatory Visit: Payer: Self-pay | Admitting: Endocrinology

## 2017-05-14 ENCOUNTER — Other Ambulatory Visit: Payer: Self-pay | Admitting: Endocrinology

## 2017-05-14 DIAGNOSIS — E119 Type 2 diabetes mellitus without complications: Secondary | ICD-10-CM

## 2017-05-14 MED ORDER — METOPROLOL SUCCINATE ER 25 MG PO TB24: 12.5000 mg | ORAL_TABLET | Freq: Every day | ORAL | 3 refills | Status: DC

## 2017-05-16 ENCOUNTER — Ambulatory Visit (INDEPENDENT_AMBULATORY_CARE_PROVIDER_SITE_OTHER): Payer: BLUE CROSS/BLUE SHIELD

## 2017-05-16 DIAGNOSIS — E538 Deficiency of other specified B group vitamins: Secondary | ICD-10-CM | POA: Diagnosis not present

## 2017-05-16 MED ORDER — CYANOCOBALAMIN 1000 MCG/ML IJ SOLN
1000.0000 ug | Freq: Once | INTRAMUSCULAR | Status: AC
  Administered 2017-05-16: 1000 ug via INTRAMUSCULAR

## 2017-05-17 ENCOUNTER — Other Ambulatory Visit: Payer: Self-pay | Admitting: Endocrinology

## 2017-05-20 ENCOUNTER — Encounter: Payer: Self-pay | Admitting: Pulmonary Disease

## 2017-05-20 ENCOUNTER — Ambulatory Visit: Payer: BLUE CROSS/BLUE SHIELD | Admitting: Pulmonary Disease

## 2017-05-20 VITALS — BP 128/84 | HR 68 | Ht 68.5 in | Wt 248.0 lb

## 2017-05-20 DIAGNOSIS — Z9989 Dependence on other enabling machines and devices: Secondary | ICD-10-CM

## 2017-05-20 DIAGNOSIS — G4733 Obstructive sleep apnea (adult) (pediatric): Secondary | ICD-10-CM

## 2017-05-20 NOTE — Patient Instructions (Signed)
Follow up in 1 year.

## 2017-05-20 NOTE — Progress Notes (Signed)
[facility]  Chief Complaint  Patient presents with  . Follow-up    OSA on CPAP     Vital signs: BP 128/84 (BP Location: Left Arm, Cuff Size: Normal)   Pulse 68   Ht 5' 8.5" (1.74 m)   Wt 248 lb (112.5 kg)   SpO2 98%   BMI 37.16 kg/m   History of Present Illness: Savannah [name] is a 62 y.o. female with obstructive sleep apnea.  She is doing well with CPAP.  No issues with mask fit.  Uses nasal pillows.  Feels rested.  No issues with pressure from machine.   Physical Exam:  General - pleasant Eyes - pupils reactive ENT - no sinus tenderness, no oral exudate, no LAN Cardiac - regular, no murmur Chest - no wheeze, rales Abd - soft, non tender Ext - no edema Skin - no rashes Neuro - normal strength Psych - normal mood  Assessment/Plan:  Obstructive sleep apnea. - she is compliant with CPAP and reports benefit from therapy - continue CPAP 12 cm H2O - will arrange for replacement mask and supplies   Patient Instructions  Follow up in 1 year    Chesley Mires, MD [place] 05/20/2017, 10:37 AM Pager:  [phone]  Flow Sheet  Sleep tests: PSG 09/11/04 >> AHI 119, SpO2 low 75% CPAP 04/15/16 to 05/14/17 >> used on 30 of 30 nights with average 11 hrs 46 min.  Average AHI 4.9 with CPAP 12 cm H2O  Past Medical History: She  has a past medical history of ALLERGIC RHINITIS CAUSE UNSPECIFIED (12/29/2007), ANEMIA, IRON DEFICIENCY (12/06/2008), AODM (04/18/2007), ASTHMA (09/27/2006), ASYMPTOMATIC POSTMENOPAUSAL STATUS (03/19/2008), BACK PAIN, LUMBAR (12/29/2009), Chronic kidney disease, Depression, FATTY LIVER DISEASE (03/19/2008), HYPERCHOLESTEROLEMIA (04/18/2009), HYPERTENSION (09/27/2006), HYPOKALEMIA (01/15/2007), OBSTRUCTIVE SLEEP APNEA (08/09/2009), OSTEOARTHRITIS, LUMBAR SPINE (04/18/2009), Palpitations (05/23/2009), Sleep apnea, Unspecified hypothyroidism (01/07/2007), and URINARY CALCULUS (11/08/2009).  Past Surgical  History: She  has a past surgical history that includes Knee arthroscopy (Left, 1993); Cardiac catheterization (2011); compound fracture (Right); right foot surgery ; and Robotic assited partial nephrectomy (Right, 05/19/2014).  Family History: Her family history includes Allergies in her sister; Asthma in her sister; Clotting disorder in her grandchild; Colon cancer (age of onset: 18) in her mother; Diabetes in her mother; Heart attack in her father; Heart disease in her father and paternal grandmother; Kidney disease in her mother.  Social History: She  reports that she quit smoking about 42 years ago. Her smoking use included cigarettes. She has a 0.60 pack-year smoking history. She has never used smokeless tobacco. She reports that she does not drink alcohol or use drugs.  Medications: Allergies as of 05/20/2017      Reactions   Erythromycin Nausea And Vomiting      Medication List        Accurate as of 05/20/17 10:37 AM. Always use your most recent med list.          albuterol 108 (90 Base) MCG/ACT inhaler Commonly known as:  PROAIR HFA INHALE 2 PUFFS EVERY 4 HOURS AS NEEDED FOR WHEEZING, COUGH & SHORTNESS OF BREATH   atorvastatin 40 MG tablet Commonly known as:  LIPITOR TAKE 1 TABLET BY MOUTH EVERY DAY   cholecalciferol 1000 units tablet Commonly known as:  VITAMIN D Take 1,000 Units by mouth 2 (two) times daily.   citalopram 20 MG tablet Commonly known as:  CELEXA TAKE 1 TABLET BY MOUTH EVERY DAY   diclofenac sodium 1 % Gel Commonly  known as:  VOLTAREN Apply 2 g topically 4 (four) times daily.   ferrous sulfate 325 (65 FE) MG tablet Take 325 mg by mouth daily with breakfast.   Fluticasone-Salmeterol 100-50 MCG/DOSE Aepb Commonly known as:  ADVAIR Inhale 1 puff into the lungs 2 (two) times daily.   gabapentin 300 MG capsule Commonly known as:  NEURONTIN TAKE ONE CAPSULE BY MOUTH AT BEDTIME   glucose blood test strip Commonly known as:  ONETOUCH VERIO Use to  check blood sugar 1 time per day.   hyoscyamine 0.125 MG SL tablet Commonly known as:  LEVSIN SL Take 1-2 tablets by mouth or under tongue every 4 hours as needed.   levothyroxine 137 MCG tablet Commonly known as:  SYNTHROID, LEVOTHROID Take 1 tablet (137 mcg total) by mouth daily before breakfast.   losartan-hydrochlorothiazide 100-12.5 MG tablet Commonly known as:  HYZAAR Take 1 tablet by mouth daily.   metFORMIN 500 MG 24 hr tablet Commonly known as:  GLUCOPHAGE-XR TAKE 2 TABLETS BY MOUTH TWICE A DAY   metoprolol succinate 25 MG 24 hr tablet Commonly known as:  TOPROL-XL Take 0.5 tablets (12.5 mg total) by mouth daily.   ONETOUCH DELICA LANCETS FINE Misc Use to check blood sugar 1 time per day.   pioglitazone 15 MG tablet Commonly known as:  ACTOS TAKE 1 TABLET BY MOUTH EVERY DAY   PRESCRIPTION MEDICATION Inhale into the lungs at bedtime. CPAP   traZODone 100 MG tablet Commonly known as:  DESYREL TAKE 1 TABLET BY MOUTH EVERY DAY AT BEDTIME

## 2017-05-23 ENCOUNTER — Other Ambulatory Visit: Payer: Self-pay | Admitting: Endocrinology

## 2017-05-24 ENCOUNTER — Other Ambulatory Visit: Payer: Self-pay | Admitting: Urology

## 2017-05-31 ENCOUNTER — Encounter (HOSPITAL_BASED_OUTPATIENT_CLINIC_OR_DEPARTMENT_OTHER): Payer: Self-pay | Admitting: *Deleted

## 2017-06-02 ENCOUNTER — Other Ambulatory Visit: Payer: Self-pay | Admitting: Endocrinology

## 2017-06-02 DIAGNOSIS — E119 Type 2 diabetes mellitus without complications: Secondary | ICD-10-CM

## 2017-06-03 ENCOUNTER — Encounter (HOSPITAL_BASED_OUTPATIENT_CLINIC_OR_DEPARTMENT_OTHER): Payer: Self-pay | Admitting: *Deleted

## 2017-06-03 ENCOUNTER — Other Ambulatory Visit: Payer: Self-pay

## 2017-06-03 NOTE — Progress Notes (Signed)
SPOKE W/ PT VIA PHONE FOR PRE-OP INTERVIEW.  NPO AFTER MN W/ EXCEPTION CLEAR LIQUIDS UNTIL 0830 (NO CREAM [place] PRODUCTS).  ARRIVE AT 1230.  NEEDS ISTAT 8.  CURRENT EKG IN CHART AND Epic.  WILL TAKE CELEXA, LIPITOR, TOPROL, SYNTHROID, AND DO ADVAIR INHALER AM DOS W/ SIPS OF WATER.  WILL BRING RESCUE INHALER AND CPAP MASK/ TUBING.

## 2017-06-07 ENCOUNTER — Ambulatory Visit (HOSPITAL_BASED_OUTPATIENT_CLINIC_OR_DEPARTMENT_OTHER): Payer: BLUE CROSS/BLUE SHIELD | Admitting: Anesthesiology

## 2017-06-07 ENCOUNTER — Ambulatory Visit (HOSPITAL_BASED_OUTPATIENT_CLINIC_OR_DEPARTMENT_OTHER)
Admission: RE | Admit: 2017-06-07 | Discharge: 2017-06-07 | Disposition: A | Discharge status: Home / Self Care | Payer: BLUE CROSS/BLUE SHIELD | Source: Ambulatory Visit | Attending: Urology | Admitting: Urology

## 2017-06-07 ENCOUNTER — Encounter (HOSPITAL_BASED_OUTPATIENT_CLINIC_OR_DEPARTMENT_OTHER): Payer: Self-pay | Admitting: *Deleted

## 2017-06-07 ENCOUNTER — Encounter (HOSPITAL_BASED_OUTPATIENT_CLINIC_OR_DEPARTMENT_OTHER)
Admission: RE | Disposition: A | Discharge status: Home / Self Care | Payer: Self-pay | Source: Ambulatory Visit | Attending: Urology

## 2017-06-07 ENCOUNTER — Other Ambulatory Visit: Payer: Self-pay

## 2017-06-07 DIAGNOSIS — E039 Hypothyroidism, unspecified: Secondary | ICD-10-CM | POA: Diagnosis not present

## 2017-06-07 DIAGNOSIS — D509 Iron deficiency anemia, unspecified: Secondary | ICD-10-CM | POA: Insufficient documentation

## 2017-06-07 DIAGNOSIS — Z7984 Long term (current) use of oral hypoglycemic drugs: Secondary | ICD-10-CM | POA: Diagnosis not present

## 2017-06-07 DIAGNOSIS — Z79890 Hormone replacement therapy (postmenopausal): Secondary | ICD-10-CM | POA: Diagnosis not present

## 2017-06-07 DIAGNOSIS — Z905 Acquired absence of kidney: Secondary | ICD-10-CM | POA: Diagnosis not present

## 2017-06-07 DIAGNOSIS — E538 Deficiency of other specified B group vitamins: Secondary | ICD-10-CM | POA: Insufficient documentation

## 2017-06-07 DIAGNOSIS — N200 Calculus of kidney: Secondary | ICD-10-CM | POA: Diagnosis not present

## 2017-06-07 DIAGNOSIS — E1142 Type 2 diabetes mellitus with diabetic polyneuropathy: Secondary | ICD-10-CM | POA: Diagnosis not present

## 2017-06-07 DIAGNOSIS — I10 Essential (primary) hypertension: Secondary | ICD-10-CM | POA: Diagnosis not present

## 2017-06-07 DIAGNOSIS — Z79899 Other long term (current) drug therapy: Secondary | ICD-10-CM | POA: Insufficient documentation

## 2017-06-07 DIAGNOSIS — F329 Major depressive disorder, single episode, unspecified: Secondary | ICD-10-CM | POA: Diagnosis not present

## 2017-06-07 DIAGNOSIS — J45909 Unspecified asthma, uncomplicated: Secondary | ICD-10-CM | POA: Diagnosis not present

## 2017-06-07 DIAGNOSIS — E669 Obesity, unspecified: Secondary | ICD-10-CM | POA: Insufficient documentation

## 2017-06-07 DIAGNOSIS — Z7951 Long term (current) use of inhaled steroids: Secondary | ICD-10-CM | POA: Insufficient documentation

## 2017-06-07 DIAGNOSIS — G4733 Obstructive sleep apnea (adult) (pediatric): Secondary | ICD-10-CM | POA: Insufficient documentation

## 2017-06-07 DIAGNOSIS — Z6836 Body mass index (BMI) 36.0-36.9, adult: Secondary | ICD-10-CM | POA: Insufficient documentation

## 2017-06-07 DIAGNOSIS — Z85528 Personal history of other malignant neoplasm of kidney: Secondary | ICD-10-CM | POA: Insufficient documentation

## 2017-06-07 DIAGNOSIS — Z87891 Personal history of nicotine dependence: Secondary | ICD-10-CM | POA: Diagnosis not present

## 2017-06-07 DIAGNOSIS — Z9989 Dependence on other enabling machines and devices: Secondary | ICD-10-CM | POA: Insufficient documentation

## 2017-06-07 HISTORY — DX: Obstructive sleep apnea (adult) (pediatric): G47.33

## 2017-06-07 HISTORY — DX: Iron deficiency anemia, unspecified: D50.9

## 2017-06-07 HISTORY — DX: Nocturia: R35.1

## 2017-06-07 HISTORY — DX: Personal history of urinary calculi: Z87.442

## 2017-06-07 HISTORY — DX: Essential (primary) hypertension: I10

## 2017-06-07 HISTORY — DX: Personal history of other malignant neoplasm of kidney: Z85.528

## 2017-06-07 HISTORY — DX: Hypothyroidism, unspecified: E03.9

## 2017-06-07 HISTORY — DX: Allergic rhinitis, unspecified: J30.9

## 2017-06-07 HISTORY — DX: Deficiency of other specified B group vitamins: E53.8

## 2017-06-07 HISTORY — DX: Dependence on other enabling machines and devices: Z99.89

## 2017-06-07 HISTORY — DX: Spondylosis without myelopathy or radiculopathy, lumbar region: M47.816

## 2017-06-07 HISTORY — DX: Polyneuropathy, unspecified: G62.9

## 2017-06-07 HISTORY — DX: Calculus of kidney: N20.0

## 2017-06-07 HISTORY — DX: Fatty (change of) liver, not elsewhere classified: K76.0

## 2017-06-07 HISTORY — DX: Type 2 diabetes mellitus without complications: E11.9

## 2017-06-07 HISTORY — DX: Presence of spectacles and contact lenses: Z97.3

## 2017-06-07 HISTORY — PX: CYSTOSCOPY WITH RETROGRADE PYELOGRAM, URETEROSCOPY AND STENT PLACEMENT: SHX5789

## 2017-06-07 HISTORY — PX: HOLMIUM LASER APPLICATION: SHX5852

## 2017-06-07 HISTORY — DX: Unspecified asthma, uncomplicated: J45.909

## 2017-06-07 LAB — POCT I-STAT, CHEM 8
BUN: 24 mg/dL — ABNORMAL HIGH (ref 6–20)
CALCIUM ION: 1.12 mmol/L — AB (ref 1.15–1.40)
CREATININE: 1.4 mg/dL — AB (ref 0.44–1.00)
Chloride: 102 mmol/L (ref 101–111)
GLUCOSE: 101 mg/dL — AB (ref 65–99)
HCT: 33 % — ABNORMAL LOW (ref 36.0–46.0)
Hemoglobin: 11.2 g/dL — ABNORMAL LOW (ref 12.0–15.0)
POTASSIUM: 5.1 mmol/L (ref 3.5–5.1)
Sodium: 141 mmol/L (ref 135–145)
TCO2: 28 mmol/L (ref 22–32)

## 2017-06-07 LAB — GLUCOSE, CAPILLARY: GLUCOSE-CAPILLARY: 114 mg/dL — AB (ref 65–99)

## 2017-06-07 SURGERY — CYSTOURETEROSCOPY, WITH RETROGRADE PYELOGRAM AND STENT INSERTION
Anesthesia: General | Site: Renal | Laterality: Right

## 2017-06-07 MED ORDER — DEXAMETHASONE SODIUM PHOSPHATE 4 MG/ML IJ SOLN
INTRAMUSCULAR | Status: DC | PRN
  Administered 2017-06-07: 10 mg via INTRAVENOUS

## 2017-06-07 MED ORDER — KETOROLAC TROMETHAMINE 10 MG PO TABS: 10.0000 mg | ORAL_TABLET | Freq: Four times a day (QID) | ORAL | 1 refills | Status: DC | PRN

## 2017-06-07 MED ORDER — LIDOCAINE 2% (20 MG/ML) 5 ML SYRINGE
INTRAMUSCULAR | Status: DC | PRN
  Administered 2017-06-07: 20 mg via INTRAVENOUS
  Administered 2017-06-07: 80 mg via INTRAVENOUS

## 2017-06-07 MED ORDER — EPHEDRINE SULFATE-NACL 50-0.9 MG/10ML-% IV SOSY
PREFILLED_SYRINGE | INTRAVENOUS | Status: DC | PRN
  Administered 2017-06-07 (×2): 10 mg via INTRAVENOUS

## 2017-06-07 MED ORDER — METOPROLOL SUCCINATE ER 25 MG PO TB24
25.0000 mg | ORAL_TABLET | Freq: Every day | ORAL | Status: DC
  Administered 2017-06-07: 25 mg via ORAL
  Filled 2017-06-07 (×2): qty 1

## 2017-06-07 MED ORDER — LACTATED RINGERS IV SOLN
INTRAVENOUS | Status: DC
  Administered 2017-06-07 (×2): via INTRAVENOUS
  Filled 2017-06-07: qty 1000

## 2017-06-07 MED ORDER — SENNOSIDES-DOCUSATE SODIUM 8.6-50 MG PO TABS: 1.0000 | ORAL_TABLET | Freq: Two times a day (BID) | ORAL | 0 refills | Status: DC

## 2017-06-07 MED ORDER — METOCLOPRAMIDE HCL 5 MG/ML IJ SOLN
INTRAMUSCULAR | Status: AC
  Filled 2017-06-07: qty 2

## 2017-06-07 MED ORDER — FENTANYL CITRATE (PF) 100 MCG/2ML IJ SOLN
25.0000 ug | INTRAMUSCULAR | Status: DC | PRN
  Filled 2017-06-07: qty 1

## 2017-06-07 MED ORDER — ONDANSETRON HCL 4 MG/2ML IJ SOLN
4.0000 mg | Freq: Once | INTRAMUSCULAR | Status: AC | PRN
  Administered 2017-06-07: 4 mg via INTRAVENOUS
  Filled 2017-06-07: qty 2

## 2017-06-07 MED ORDER — ONDANSETRON HCL 4 MG/2ML IJ SOLN
INTRAMUSCULAR | Status: AC
  Filled 2017-06-07: qty 2

## 2017-06-07 MED ORDER — KETOROLAC TROMETHAMINE 15 MG/ML IJ SOLN
INTRAMUSCULAR | Status: DC | PRN
  Administered 2017-06-07: 15 mg via INTRAVENOUS

## 2017-06-07 MED ORDER — LIDOCAINE 2% (20 MG/ML) 5 ML SYRINGE
INTRAMUSCULAR | Status: AC
  Filled 2017-06-07: qty 5

## 2017-06-07 MED ORDER — DEXTROSE 5 % IV SOLN
5.0000 mg/kg | INTRAVENOUS | Status: DC
  Administered 2017-06-07: 420 mg via INTRAVENOUS
  Filled 2017-06-07 (×2): qty 10.5

## 2017-06-07 MED ORDER — GENTAMICIN SULFATE 40 MG/ML IJ SOLN
5.0000 mg/kg | INTRAVENOUS | Status: DC
  Filled 2017-06-07: qty 14

## 2017-06-07 MED ORDER — METOCLOPRAMIDE HCL 5 MG/ML IJ SOLN
INTRAMUSCULAR | Status: DC | PRN
  Administered 2017-06-07: 10 mg via INTRAVENOUS

## 2017-06-07 MED ORDER — FENTANYL CITRATE (PF) 100 MCG/2ML IJ SOLN
INTRAMUSCULAR | Status: DC | PRN
  Administered 2017-06-07 (×3): 25 ug via INTRAVENOUS

## 2017-06-07 MED ORDER — IOHEXOL 300 MG/ML  SOLN
INTRAMUSCULAR | Status: DC | PRN
  Administered 2017-06-07: 15 mL

## 2017-06-07 MED ORDER — ONDANSETRON HCL 4 MG/2ML IJ SOLN
INTRAMUSCULAR | Status: DC | PRN
  Administered 2017-06-07: 4 mg via INTRAVENOUS

## 2017-06-07 MED ORDER — DEXAMETHASONE SODIUM PHOSPHATE 10 MG/ML IJ SOLN
INTRAMUSCULAR | Status: AC
  Filled 2017-06-07: qty 1

## 2017-06-07 MED ORDER — SODIUM CHLORIDE 0.9 % IR SOLN
Status: DC | PRN
  Administered 2017-06-07: 4000 mL

## 2017-06-07 MED ORDER — FENTANYL CITRATE (PF) 100 MCG/2ML IJ SOLN
INTRAMUSCULAR | Status: AC
  Filled 2017-06-07: qty 2

## 2017-06-07 MED ORDER — EPHEDRINE 5 MG/ML INJ
INTRAVENOUS | Status: AC
  Filled 2017-06-07: qty 10

## 2017-06-07 MED ORDER — PROPOFOL 10 MG/ML IV BOLUS
INTRAVENOUS | Status: DC | PRN
  Administered 2017-06-07: 10 mg via INTRAVENOUS
  Administered 2017-06-07: 180 mg via INTRAVENOUS
  Administered 2017-06-07: 10 mg via INTRAVENOUS

## 2017-06-07 MED ORDER — TRAMADOL HCL 50 MG PO TABS: 50.0000 mg | ORAL_TABLET | Freq: Four times a day (QID) | ORAL | 0 refills | Status: DC | PRN

## 2017-06-07 MED ORDER — KETOROLAC TROMETHAMINE 30 MG/ML IJ SOLN
INTRAMUSCULAR | Status: AC
  Filled 2017-06-07: qty 1

## 2017-06-07 MED ORDER — PROPOFOL 10 MG/ML IV BOLUS
INTRAVENOUS | Status: AC
  Filled 2017-06-07: qty 20

## 2017-06-07 SURGICAL SUPPLY — 28 items
BAG DRAIN URO-CYSTO SKYTR STRL (DRAIN) ×2 IMPLANT
BAG DRN UROCATH (DRAIN) ×1
BASKET LASER NITINOL 1.9FR (BASKET) IMPLANT
BSKT STON RTRVL 120 1.9FR (BASKET)
CATH INTERMIT  6FR 70CM (CATHETERS) IMPLANT
CLOTH BEACON ORANGE TIMEOUT ST (SAFETY) ×2 IMPLANT
FIBER LASER FLEXIVA 365 (UROLOGICAL SUPPLIES) IMPLANT
FIBER LASER TRAC TIP (UROLOGICAL SUPPLIES) ×1 IMPLANT
GLOVE BIO SURGEON STRL SZ7.5 (GLOVE) ×2 IMPLANT
GLOVE BIOGEL PI IND STRL 7.5 (GLOVE) IMPLANT
GLOVE BIOGEL PI INDICATOR 7.5 (GLOVE) ×3
GOWN STRL REUS W/TWL LRG LVL3 (GOWN DISPOSABLE) ×3 IMPLANT
GUIDEWIRE ANG ZIPWIRE 038X150 (WIRE) ×2 IMPLANT
GUIDEWIRE STR DUAL SENSOR (WIRE) ×2 IMPLANT
INFUSOR MANOMETER BAG 3000ML (MISCELLANEOUS) ×2 IMPLANT
IV NS 1000ML (IV SOLUTION) ×2
IV NS 1000ML BAXH (IV SOLUTION) ×1 IMPLANT
IV NS IRRIG 3000ML ARTHROMATIC (IV SOLUTION) ×2 IMPLANT
KIT TURNOVER CYSTO (KITS) ×2 IMPLANT
MANIFOLD NEPTUNE II (INSTRUMENTS) ×2 IMPLANT
NS IRRIG 1000ML POUR BTL (IV SOLUTION) ×1 IMPLANT
NS IRRIG 500ML POUR BTL (IV SOLUTION) ×2 IMPLANT
PACK CYSTO (CUSTOM PROCEDURE TRAY) ×2 IMPLANT
SHEATH URETERAL 12FRX28CM (UROLOGICAL SUPPLIES) ×1 IMPLANT
STENT POLARIS 5FRX24 (STENTS) ×1 IMPLANT
SYRINGE 10CC LL (SYRINGE) ×2 IMPLANT
TUBE CONNECTING 12X1/4 (SUCTIONS) IMPLANT
TUBE FEEDING 8FR 16IN STR KANG (MISCELLANEOUS) ×1 IMPLANT

## 2017-06-07 NOTE — Discharge Instructions (Signed)
1 - You may have urinary urgency (bladder spasms) and bloody urine on / off with stent in place. This is normal. ° °2 - Call MD or go to ER for fever >102, severe pain / nausea / vomiting not relieved by medications, or acute change in medical status ° °[facility] °[phone] °Post Ureteroscopy With or Without Stent Instructions ° °Definitions: ° °Ureter: The duct that transports urine from the kidney to the bladder. °Stent:   A plastic hollow tube that is placed into the ureter, from the kidney to the  bladder to prevent the ureter from swelling shut. ° °GENERAL INSTRUCTIONS: ° °Despite the fact that no skin incisions were used, the area around the ureter and bladder is raw and irritated. The stent is a foreign body which will further irritate the bladder wall. This irritation is manifested by increased frequency of urination, both day and night, and by an increase in the urge to urinate. In some, the urge to urinate is present almost always. Sometimes the urge is strong enough that you may not be able to stop yourself from urinating. The only real cure is to remove the stent and then give time for the bladder wall to heal which can't be done until the danger of the ureter swelling shut has passed, which varies. ° °You may see some blood in your urine while the stent is in place and a few days afterwards. Do not be alarmed, even if the urine was clear for a while. Get off your feet and drink lots of fluids until clearing occurs. If you start to pass clots or don't improve, call us. ° °DIET: °You may return to your normal diet immediately. Because of the raw surface of your bladder, alcohol, spicy foods, acid type foods and drinks with caffeine may cause irritation or frequency and should be used in moderation. To keep your urine flowing freely and to avoid constipation, drink plenty of fluids during the day ( 8-10 glasses ). °Tip: Avoid cranberry juice because it is very  acidic. ° °ACTIVITY: °Your physical activity doesn't need to be restricted. However, if you are very active, you may see some blood in your urine. We suggest that you reduce your activity under these circumstances until the bleeding has stopped. ° °BOWELS: °It is important to keep your bowels regular during the postoperative period. Straining with bowel movements can cause bleeding. A bowel movement every other day is reasonable. Use a mild laxative if needed, such as Milk of Magnesia 2-3 tablespoons, or 2 Dulcolax tablets. Call if you continue to have problems. If you have been taking narcotics for pain, before, during or after your surgery, you may be constipated. Take a laxative if necessary. ° ° °MEDICATION: °You should resume your pre-surgery medications unless told not to. In addition you will often be given an antibiotic to prevent infection. These should be taken as prescribed until the bottles are finished unless you are having an unusual reaction to one of the drugs. ° °PROBLEMS YOU SHOULD REPORT TO US: °· Fevers over 100.5 Fahrenheit. °· Heavy bleeding, or clots ( See above notes about blood in urine ). °· Inability to urinate. °· Drug reactions ( hives, rash, nausea, vomiting, diarrhea ). °· Severe burning or pain with urination that is not improving. ° °FOLLOW-UP: °You will need a follow-up appointment to monitor your progress. Call for this appointment at the number listed above. Usually the first appointment will be about three to fourteen days after your surgery. ° ° ° ° ° °  Post Anesthesia Home Care Instructions ° °Activity: °Get plenty of rest for the remainder of the day. A responsible individual must stay with you for 24 hours following the procedure.  °For the next 24 hours, DO NOT: °-Drive a car °-Operate machinery °-Drink alcoholic beverages °-Take any medication unless instructed by your physician °-Make any legal decisions or sign important papers. ° °Meals: °Start with liquid foods such as  gelatin or soup. Progress to regular foods as tolerated. Avoid greasy, spicy, heavy foods. If nausea and/or vomiting occur, drink only clear liquids until the nausea and/or vomiting subsides. Call your physician if vomiting continues. ° °Special Instructions/Symptoms: °Your throat may feel dry or sore from the anesthesia or the breathing tube placed in your throat during surgery. If this causes discomfort, gargle with warm salt water. The discomfort should disappear within 24 hours. ° °If you had a scopolamine patch placed behind your ear for the management of post- operative nausea and/or vomiting: ° °1. The medication in the patch is effective for 72 hours, after which it should be removed.  Wrap patch in a tissue and discard in the trash. Wash hands thoroughly with soap and water. °2. You may remove the patch earlier than 72 hours if you experience unpleasant side effects which may include dry mouth, dizziness or visual disturbances. °3. Avoid touching the patch. Wash your hands with soap and water after contact with the patch. °  ° ° °

## 2017-06-07 NOTE — H&P (Signed)
Savannah [name] is an 62 y.o. female.    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    Post-op Surveillance:   - 11/2014 CT NED, Cr 1.2, stable Rt renal stone.  - 04/2016 CXR, [place], CMP NED, Cr 1.0 ([employer] denied CT despite peer to peer). Scattered Rt > Lt intrarenal stones w/o obstruction.  - 04/2017 CMP, CT no recurrence, Cr 1.0, About 2cm total volume Rt renal stones, no hydro, 500HU (doubled since 2016).    2 - Recurrent Nephrolithiasis - Several episodes small stone passage medically. CT 03/2014 with stable Rt sided milk of calcium appearing stone.    3 - Medical Stone Disease / Oliguria / Hyperolxaluria / Hypocitraturia - piror equivocal workup for hyperparathyroidism   Eval 2015: CMP normal; 24 Hr Urines - low volume (1.4L), high oxalate (74), low citrate (174) --> dietary modification with increased hydration and dietary citrate (did not tolerate K-Cit)    PMH sig for DM2, ortho surgery x several, obesity. NO blood thinners. Her PCP is Dr. Loanne Drilling.    Today " Savannah [name] " is seen to proceed with first stage ureteroscopy for large volume RIGHT renal stones. NO interval fevers. Most recetn UCX non-clonal and given proph Bactrim pre-op.    Past Medical History:  Diagnosis Date  . Allergic rhinitis   . Asthma   . B12 deficiency   . Depression   . History of kidney stones   . 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 Hypertension   . Hypothyroidism    endocrinologist -  dr Hale Bogus  . Iron deficiency anemia   . Lumbar spondylosis   . NAFLD (nonalcoholic fatty liver disease)    dx 2010---  last hepatic panel in epic dated 04-17-2017  . Nocturia   . OSA on CPAP pulmologist-  dr sood   sleep study 09-11-2004  very severe osa , AHI 119/hr,  setting 10  . Peripheral neuropathy    bottom of feet  . Renal calculus, right   . Type 2 diabetes mellitus ([place])    endocrinoloigst-  dr Loanne Drilling,  last A1c 5.9 on 02-20-219 in epic  . Wears glasses     Past Surgical History:  Procedure Laterality Date  . BUNIONECTOMY/  HAMMERTOE CORRECTION'S  09-07-2013   SCG   RIGHT FOOT 2ND, 3RD, 4TH, 5TH DIGITS  . CARDIAC CATHETERIZATION  06-01-2009  dr Angelena Form   no evidence CAD,  normal LVSF, elevated blood pressure w/ elevated end-diastolic pressure, ef 09-32%  . CARDIOVASCULAR STRESS TEST  05-11-2009   dr Angelena Form   abnormal lexiscan nuclear study (no exercise) w/ mild ischemia in the distal anteroseptal wall and apex, this defect may be due to shifting breast attenuation  . KNEE ARTHROSCOPY Left 1993  . ROBOTIC ASSITED PARTIAL NEPHRECTOMY Right 05/19/2014   Procedure: ROBOTIC ASSITED PARTIAL NEPHRECTOMY;  Surgeon: Alexis Frock, MD;  Location: [facility];  Service: Urology;  Laterality: Right;    Family History  Problem Relation Age of Onset  . Colon cancer Mother 45  . Diabetes Mother   . Kidney disease Mother   . Heart attack Father   . Heart disease  Father   . Allergies Sister   . Asthma Sister   . Clotting disorder Grandchild   . Heart disease Paternal Grandmother   . Esophageal cancer Neg Hx   . Rectal cancer Neg Hx   . Stomach cancer Neg Hx    Social History:  reports that she quit smoking about 42 years ago. Her smoking use included cigarettes. She has a 0.60 pack-year smoking history. She has never used smokeless tobacco. She reports that she does not drink alcohol or use drugs.  Allergies:  Allergies  Allergen Reactions  . Erythromycin Nausea And Vomiting    Medications Prior to Admission  Medication Sig Dispense Refill  . atorvastatin (LIPITOR) 40 MG tablet TAKE 1 TABLET  BY MOUTH EVERY DAY (Patient taking differently: TAKE 1 TABLET BY MOUTH EVERY DAY--- takes in am) 90 tablet 1  . cholecalciferol (VITAMIN D) 1000 units tablet Take 1,000 Units by mouth 2 (two) times daily.    . citalopram (CELEXA) 20 MG tablet TAKE 1 TABLET BY MOUTH EVERY DAY (Patient taking differently: TAKE 1 TABLET BY MOUTH EVERY DAY--- takes in am) 90 tablet 3  . ferrous sulfate 325 (65 FE) MG tablet Take 325 mg by mouth daily with breakfast.    . gabapentin (NEURONTIN) 300 MG capsule TAKE ONE CAPSULE BY MOUTH AT BEDTIME 90 capsule 1  . hyoscyamine (LEVSIN SL) 0.125 MG SL tablet Take 1-2 tablets by mouth or under tongue every 4 hours as needed. (Patient taking differently: Take 0.125-0.25 mg by mouth every 4 (four) hours as needed for cramping (stomach spasms). ) 100 tablet 5  . levothyroxine (SYNTHROID, LEVOTHROID) 137 MCG tablet Take 1 tablet (137 mcg total) by mouth daily before breakfast. 90 tablet 3  . losartan-hydrochlorothiazide (HYZAAR) 100-12.5 MG tablet TAKE 1 TABLET BY MOUTH EVERY DAY (Patient taking differently: TAKE 1 TABLET BY MOUTH EVERY DAY--- takes in am) 90 tablet 3  . metFORMIN (GLUCOPHAGE-XR) 500 MG 24 hr tablet TAKE 2 TABLETS BY MOUTH TWICE A DAY (Patient taking differently: Take 1,000 mg by mouth 2 (two) times daily. TAKE 2 TABLETS BY MOUTH TWICE A DAY) 360 tablet 2  . metoprolol succinate (TOPROL-XL) 25 MG 24 hr tablet Take 0.5 tablets (12.5 mg total) by mouth daily. (Patient taking differently: Take 12.5 mg by mouth every morning. ) 45 tablet 3  . pioglitazone (ACTOS) 15 MG tablet TAKE 1 TABLET BY MOUTH EVERY DAY (Patient taking differently: TAKE 1 TABLET BY MOUTH EVERY DAY--- takes in am) 90 tablet 0  . traZODone (DESYREL) 100 MG tablet TAKE 1 TABLET BY MOUTH EVERY DAY AT BEDTIME 90 tablet 1  . albuterol (PROAIR HFA) 108 (90 BASE) MCG/ACT inhaler INHALE 2 PUFFS EVERY 4 HOURS AS NEEDED FOR WHEEZING, COUGH & SHORTNESS OF BREATH (Patient taking differently: Inhale 2 puffs into  the lungs every 4 (four) hours as needed for wheezing or shortness of breath (cough). ) 8.5 Inhaler 11  . Cyanocobalamin (B-12 COMPLIANCE INJECTION IJ) Inject as directed every 30 (thirty) days.    . diclofenac sodium (VOLTAREN) 1 % GEL Apply 2 g topically 4 (four) times daily. (Patient taking differently: Apply 2 g topically 4 (four) times daily as needed. ) 1 Tube 5  . Fluticasone-Salmeterol (ADVAIR) 100-50 MCG/DOSE AEPB Inhale 1 puff into the lungs 2 (two) times daily. (Patient taking differently: Inhale 1 puff into the lungs 2 (two) times daily. ) 1 each 11  . glucose blood (ONETOUCH VERIO) test strip Use to check blood sugar 1 time per day.  100 each 3  . ONETOUCH DELICA LANCETS FINE MISC Use to check blood sugar 1 time per day. 100 each 2    Results for orders placed or performed during the hospital encounter of 06/07/17 (from the past 48 hour(s))  I-STAT, chem 8     Status: Abnormal   Collection Time: 06/07/17 12:36 PM  Result Value Ref Range   Sodium 141 135 - 145 mmol/L   Potassium 5.1 3.5 - 5.1 mmol/L   Chloride 102 101 - 111 mmol/L   BUN 24 (H) 6 - 20 mg/dL   Creatinine, Ser 1.40 (H) 0.44 - 1.00 mg/dL   Glucose, Bld 101 (H) 65 - 99 mg/dL   Calcium, Ion 1.12 (L) 1.15 - 1.40 mmol/L   TCO2 28 22 - 32 mmol/L   Hemoglobin 11.2 (L) 12.0 - 15.0 g/dL   HCT 33.0 (L) 36.0 - 46.0 %   No results found.  Review of Systems  Constitutional: Negative.  Negative for chills and fever.  HENT: Negative.   Eyes: Negative.   Respiratory: Negative.   Cardiovascular: Negative.   Gastrointestinal: Negative.   Genitourinary: Negative.  Negative for flank pain and hematuria.  Musculoskeletal: Negative.   Skin: Negative.   Neurological: Negative.   Endo/Heme/Allergies: Negative.   Psychiatric/Behavioral: Negative.     Blood pressure (!) 144/67, pulse 64, temperature 98.7 F (37.1 C), temperature source Oral, resp. rate 19, height 5' 8.5" (1.74 m), weight 110.7 kg (244 lb 1.6 oz), SpO2 97  %. Physical Exam  Constitutional: She appears well-developed.  HENT:  Head: Normocephalic.  Eyes: Pupils are equal, round, and reactive to light.  Neck: Normal range of motion.  Cardiovascular: Normal rate.  Respiratory: Effort normal.  GI: Soft.  Genitourinary:  Genitourinary Comments: NO CVAT at present.   Musculoskeletal: Normal range of motion.  Neurological: She is alert.  Skin: Skin is warm.  Psychiatric: She has a normal mood and affect.     Assessment/Plan  Proceed as planned with RIGHT 1st stage ureteroscopic stone manipulation. Risks, benefits, alternatives, expected peri-op course discussed previously and reiterated today.   Alexis Frock, MD 06/07/2017, 2:14 PM

## 2017-06-07 NOTE — Anesthesia Procedure Notes (Addendum)
Procedure Name: LMA Insertion Date/Time: 06/07/2017 2:40 PM Performed by: Audry Pili, MD Pre-anesthesia Checklist: Patient identified, Emergency Drugs available, Suction available and Patient being monitored Patient Re-evaluated:Patient Re-evaluated prior to induction Oxygen Delivery Method: Circle system utilized Preoxygenation: Pre-oxygenation with 100% oxygen Induction Type: IV induction Ventilation: Mask ventilation without difficulty LMA: LMA inserted LMA Size: 4.0 Number of attempts: 1 Airway Equipment and Method: Bite block Placement Confirmation: positive ETCO2 Tube secured with: Tape Dental Injury: Teeth and Oropharynx as per pre-operative assessment

## 2017-06-07 NOTE — Brief Op Note (Signed)
06/07/2017  3:57 PM  PATIENT:  Savannah [name]  62 y.o. female  PRE-OPERATIVE DIAGNOSIS:  LARGE RIGHT RENAL STONE  POST-OPERATIVE DIAGNOSIS:  LARGE RIGHT RENAL STONE  PROCEDURE:  Procedure(s): CYSTOSCOPY WITH RETROGRADE PYELOGRAM, URETEROSCOPY, LITHOTRIPSY,  AND STENT PLACEMENT 1st STAGE (Right) HOLMIUM LASER APPLICATION (Right)  SURGEON:  Surgeon(s) and Role:    Alexis Frock, MD - Primary  PHYSICIAN ASSISTANT:   ASSISTANTS: none   ANESTHESIA:   general  EBL:  0 mL   BLOOD ADMINISTERED:none  DRAINS: none   LOCAL MEDICATIONS USED:  NONE  SPECIMEN:  No Specimen  DISPOSITION OF SPECIMEN:  N/A  COUNTS:  YES  TOURNIQUET:  * No tourniquets in log *  DICTATION: .Other Dictation: Dictation Number [id number]  PLAN OF CARE: Discharge to home after PACU  PATIENT DISPOSITION:  PACU - hemodynamically stable.   Delay start of Pharmacological VTE agent (>24hrs) due to surgical blood loss or risk of bleeding: yes

## 2017-06-07 NOTE — Transfer of Care (Signed)
  Last Vitals:  Vitals Value Taken Time  BP 158/75 06/07/2017  4:07 PM  Temp 36.8 C 06/07/2017  4:08 PM  Pulse 85 06/07/2017  4:10 PM  Resp 15 06/07/2017  4:10 PM  SpO2 96 % 06/07/2017  4:10 PM  Vitals shown include unvalidated device data.  Last Pain:  Vitals:   06/07/17 1311  TempSrc:   PainSc: 0-No pain      Patients Stated Pain Goal: 5 (06/07/17 1311)  Immediate Anesthesia Transfer of Care Note  Patient: Savannah [name]  Procedure(s) Performed: Procedure(s) (LRB): CYSTOSCOPY WITH RETROGRADE PYELOGRAM, URETEROSCOPY, LITHOTRIPSY,  AND STENT PLACEMENT 1st STAGE (Right) HOLMIUM LASER APPLICATION (Right)  Patient Location: PACU  Anesthesia Type: General  Level of Consciousness: awake, alert  and oriented  Airway & Oxygen Therapy: Patient Spontanous Breathing and Patient connected to nasal cannula oxygen  Post-op Assessment: Report given to PACU RN and Post -op Vital signs reviewed and stable  Post vital signs: Reviewed and stable  Complications: No apparent anesthesia complications

## 2017-06-07 NOTE — Anesthesia Preprocedure Evaluation (Addendum)
Anesthesia Evaluation  Patient identified by MRN, date of birth, ID band Patient awake    Reviewed: Allergy & Precautions, NPO status , Patient's Chart, lab work & pertinent test results  Airway Mallampati: III  TM Distance: >3 FB Neck ROM: Full    Dental  (+) Dental Advisory Given   Pulmonary asthma , sleep apnea and Continuous Positive Airway Pressure Ventilation , former smoker,    breath sounds clear to auscultation       Cardiovascular hypertension, Pt. on medications and Pt. on home beta blockers  Rhythm:Regular Rate:Normal     Neuro/Psych Depression  Neuromuscular disease    GI/Hepatic PUD, NASH   Endo/Other  diabetes, Type 2, Oral Hypoglycemic AgentsHypothyroidism   Renal/GU Renal disease (s/p right partial nephrectomy)     Musculoskeletal  (+) Arthritis ,   Abdominal   Peds  Hematology negative hematology ROS (+)   Anesthesia Other Findings   Reproductive/Obstetrics                            Anesthesia Physical Anesthesia Plan  ASA: II  Anesthesia Plan: General   Post-op Pain Management:    Induction: Intravenous  PONV Risk Score and Plan: 3 and Dexamethasone, Ondansetron, Midazolam and Treatment may vary due to age or medical condition  Airway Management Planned: LMA  Additional Equipment:   Intra-op Plan:   Post-operative Plan: Extubation in OR  Informed Consent: I have reviewed the patients History and Physical, chart, labs and discussed the procedure including the risks, benefits and alternatives for the proposed anesthesia with the patient or authorized representative who has indicated his/her understanding and acceptance.   Dental advisory given  Plan Discussed with: CRNA  Anesthesia Plan Comments:         Anesthesia Quick Evaluation

## 2017-06-10 ENCOUNTER — Encounter (HOSPITAL_BASED_OUTPATIENT_CLINIC_OR_DEPARTMENT_OTHER): Payer: Self-pay | Admitting: Urology

## 2017-06-10 NOTE — Op Note (Signed)
NAME:  Savannah [name], Savannah [name]                  ACCOUNT NO.:  MEDICAL RECORD NO.:  [id number]  LOCATION:                                 FACILITY:  PHYSICIAN:  Alexis Frock, MD          DATE OF BIRTH:  DATE OF PROCEDURE: 06/07/2017                              OPERATIVE REPORT   DIAGNOSIS:  Large right renal stone, history of recurrent nephrolithiasis.  PROCEDURES: 1. Cystoscopy with right retrograde pyelogram and interpretation. 2. First-stage right ureteroscopy with laser lithotripsy. 3. Right ureteral stent placement, 5 x 24 Polaris, no tether.  ESTIMATED BLOOD LOSS:  Nil.  COMPLICATION:  None.  SPECIMEN:  None.  FINDINGS: 1. Large-volume multifocal right renal stone, estimated approximately     2 to 2.5 cm total. 2. Dusting and ablation of approximately 60% of stone volume today. 3. Successful placement of right ureteral stent, proximal end in the     renal pelvis and distal end in the urinary bladder.  INDICATION:  The patient is a very pleasant 62 year old lady with complex urologic history including history of her renal cell carcinoma, status post partial nephrectomy as well as recurrent nephrolithiasis. She was found on surveillance imaging to have a slowly progressive increase in right renal stone burden, now approximately 2 cm.  This is at a point where it could likely still be managed in a retrograde renal fashion, but it became much larger, which certainly require percutaneous approach surgery.  This was discussed with the patient as well as option of staged ureteroscopy now with the goal of preventing future renal obstruction and preventing progression to major percutaneous surgery again and she wished to proceed with staged ureteroscopy.  She presents for first-stage procedure today.  Informed consent was obtained and placed in the medical record.  PROCEDURE IN DETAIL:  The patient being, the patient, was verified. Procedure being first-stage right  ureteroscopic stone manipulation was confirmed.  Procedure was carried out.  Time-out was performed. Intravenous antibiotics were administered.  General LMA anesthesia was introduced.  The patient was placed into a low lithotomy position. Sterile field was created by prepping and draping the patient's vagina, introitus and proximal thighs using iodine.  Cystourethroscopy was performed using a rigid cystoscope with offset lens.  Inspection of the urinary bladder revealed no diverticula, calcifications, papillary lesions.  Ureteral orifices appeared [place] bilaterally.  The right ureteral orifice was cannulated with a 6-French end-hole catheter and right retrograde pyelogram was obtained.  Right retrograde pyelogram demonstrated a single right ureter with single-system right kidney.  There was multifocal filling defects in the mid upper pole calices consistent with likely known stone.  A 0.038 Zip wire was advanced to the level of the upper pole, set aside as a safety wire.  An 8-French feeding tube placed in the urinary bladder for pressure release and semi-rigid ureteroscopy was performed to the distal four-fifths of the right ureter alongside a separate Sensor working wire.  No mucosal abnormalities were found.  The semi-rigid scope was exchanged for 12/14, 28-cm ureteral access sheath at the level of the proximal ureter using continuous fluoroscopic guidance and flexible digital ureteroscopy was performed using a single-channel ureteroscope,  which allowed inspection of the proximal ureter and systematic inspection of the right kidney including all calices x3.  As expected, there was large-volume multifocal renal stone mostly in the upper pole, this of course appeared to be much too large for simple basketing.  As such, holmium laser energy applied to the stone using settings of 0.2 joules and 30 Hz, and a dusting technique was used to ablate stone in a very systematic fashion for  approximately an hour and a half.  Following this, there was ablation of at least 60% stone volume and inherently with the amount of stone dust generated, visualization was becoming quite poor.  It was felt that the safest means of continued management would be to conclude first-stage procedure today as we achieved the goals and visualization was becoming compromised.  As such, the ureteral access sheath was removed under continuous vision.  Final retrograde pyelogram revealed no evidence of extravasation.  A new 5 x 24 Polaris- type stent was placed over the remaining safety wire using fluoroscopic guidance.  Good proximal and distal deployment were noted.  Bladder was emptied per cystoscope and procedure was then terminated.  The patient tolerated the procedure well.  There were no immediate periprocedural complications.  The patient was taken to the postanesthesia care unit in stable condition with plan for discharge today and proceed with second- stage procedure in several weeks is planned.          ______________________________ Alexis Frock, MD     TM/MEDQ  D:  06/07/2017  T:  06/07/2017  Job:  [id number]

## 2017-06-10 NOTE — Anesthesia Postprocedure Evaluation (Signed)
Anesthesia Post Note  Patient: Savannah [name]  Procedure(s) Performed: CYSTOSCOPY WITH RETROGRADE PYELOGRAM, URETEROSCOPY, LITHOTRIPSY,  AND STENT PLACEMENT 1st STAGE (Right Renal) HOLMIUM LASER APPLICATION (Right Renal)     Patient location during evaluation: PACU Anesthesia Type: General Level of consciousness: awake and alert Pain management: pain level controlled Vital Signs Assessment: post-procedure vital signs reviewed and stable Respiratory status: spontaneous breathing, nonlabored ventilation, respiratory function stable and patient connected to nasal cannula oxygen Cardiovascular status: blood pressure returned to baseline and stable Postop Assessment: no apparent nausea or vomiting Anesthetic complications: no    Last Vitals:  Vitals:   06/07/17 1715 06/07/17 1743  BP: (!) 155/61 138/63  Pulse: 70 72  Resp: 14 14  Temp:  36.8 C  SpO2: 92% 93%    Last Pain:  Vitals:   06/10/17 0948  TempSrc:   PainSc: 2                  Khushboo Chuck P Bernadetta Roell

## 2017-06-12 ENCOUNTER — Encounter (HOSPITAL_BASED_OUTPATIENT_CLINIC_OR_DEPARTMENT_OTHER): Payer: Self-pay

## 2017-06-13 ENCOUNTER — Encounter (HOSPITAL_BASED_OUTPATIENT_CLINIC_OR_DEPARTMENT_OTHER): Payer: Self-pay | Admitting: *Deleted

## 2017-06-13 ENCOUNTER — Other Ambulatory Visit: Payer: Self-pay

## 2017-06-13 NOTE — Progress Notes (Signed)
SPOKE W/ PT VIA PHONE FOR PRE-OP INTERVIEW.  NPO AFTER MN W/ EXCEPTION CLEAR LIQUIDS UNTIL 0845 (NO CREAM [employer]ODUCTS).  ARRIVE AT 7915.  NEEDS ISTAT 8.  CURRENT EKG IN CHART AND Epic.  WILL TAKE CELEXA, LIPITOR, TOPROL, SYNTHROID, AND DO ADVAIR INHALER.

## 2017-06-14 ENCOUNTER — Other Ambulatory Visit: Payer: Self-pay | Admitting: Endocrinology

## 2017-06-18 ENCOUNTER — Ambulatory Visit (INDEPENDENT_AMBULATORY_CARE_PROVIDER_SITE_OTHER): Payer: BLUE CROSS/BLUE SHIELD

## 2017-06-18 DIAGNOSIS — E538 Deficiency of other specified B group vitamins: Secondary | ICD-10-CM | POA: Diagnosis not present

## 2017-06-18 MED ORDER — CYANOCOBALAMIN 1000 MCG/ML IJ SOLN
1000.0000 ug | Freq: Once | INTRAMUSCULAR | Status: AC
  Administered 2017-06-18: 1000 ug via INTRAMUSCULAR

## 2017-06-21 ENCOUNTER — Ambulatory Visit (HOSPITAL_BASED_OUTPATIENT_CLINIC_OR_DEPARTMENT_OTHER): Payer: BLUE CROSS/BLUE SHIELD | Admitting: Anesthesiology

## 2017-06-21 ENCOUNTER — Ambulatory Visit (HOSPITAL_BASED_OUTPATIENT_CLINIC_OR_DEPARTMENT_OTHER)
Admission: RE | Admit: 2017-06-21 | Discharge: 2017-06-21 | Disposition: A | Discharge status: Home / Self Care | Payer: BLUE CROSS/BLUE SHIELD | Source: Ambulatory Visit | Attending: Urology | Admitting: Urology

## 2017-06-21 ENCOUNTER — Encounter (HOSPITAL_BASED_OUTPATIENT_CLINIC_OR_DEPARTMENT_OTHER): Payer: Self-pay | Admitting: Anesthesiology

## 2017-06-21 ENCOUNTER — Encounter (HOSPITAL_BASED_OUTPATIENT_CLINIC_OR_DEPARTMENT_OTHER)
Admission: RE | Disposition: A | Discharge status: Home / Self Care | Payer: Self-pay | Source: Ambulatory Visit | Attending: Urology

## 2017-06-21 DIAGNOSIS — F329 Major depressive disorder, single episode, unspecified: Secondary | ICD-10-CM | POA: Diagnosis not present

## 2017-06-21 DIAGNOSIS — E119 Type 2 diabetes mellitus without complications: Secondary | ICD-10-CM | POA: Insufficient documentation

## 2017-06-21 DIAGNOSIS — Z87891 Personal history of nicotine dependence: Secondary | ICD-10-CM | POA: Diagnosis not present

## 2017-06-21 DIAGNOSIS — Z79890 Hormone replacement therapy (postmenopausal): Secondary | ICD-10-CM | POA: Insufficient documentation

## 2017-06-21 DIAGNOSIS — Z79899 Other long term (current) drug therapy: Secondary | ICD-10-CM | POA: Insufficient documentation

## 2017-06-21 DIAGNOSIS — N200 Calculus of kidney: Secondary | ICD-10-CM | POA: Insufficient documentation

## 2017-06-21 DIAGNOSIS — G4730 Sleep apnea, unspecified: Secondary | ICD-10-CM | POA: Diagnosis not present

## 2017-06-21 DIAGNOSIS — Z85528 Personal history of other malignant neoplasm of kidney: Secondary | ICD-10-CM | POA: Diagnosis not present

## 2017-06-21 DIAGNOSIS — Z7984 Long term (current) use of oral hypoglycemic drugs: Secondary | ICD-10-CM | POA: Diagnosis not present

## 2017-06-21 DIAGNOSIS — Z881 Allergy status to other antibiotic agents status: Secondary | ICD-10-CM | POA: Diagnosis not present

## 2017-06-21 DIAGNOSIS — I10 Essential (primary) hypertension: Secondary | ICD-10-CM | POA: Insufficient documentation

## 2017-06-21 HISTORY — DX: Diaphragmatic hernia without obstruction or gangrene: K44.9

## 2017-06-21 HISTORY — DX: Spondylosis, unspecified: M47.9

## 2017-06-21 HISTORY — DX: Personal history of colonic polyps: Z86.010

## 2017-06-21 HISTORY — DX: Personal history of colon polyps, unspecified: Z86.0100

## 2017-06-21 HISTORY — PX: CYSTOSCOPY WITH RETROGRADE PYELOGRAM, URETEROSCOPY AND STENT PLACEMENT: SHX5789

## 2017-06-21 HISTORY — PX: HOLMIUM LASER APPLICATION: SHX5852

## 2017-06-21 HISTORY — DX: Pure hypercholesterolemia, unspecified: E78.00

## 2017-06-21 HISTORY — DX: First degree hemorrhoids: K64.0

## 2017-06-21 HISTORY — DX: Deficiency of other specified B group vitamins: E53.8

## 2017-06-21 LAB — POCT I-STAT, CHEM 8
BUN: 23 mg/dL — AB (ref 6–20)
CALCIUM ION: 1.17 mmol/L (ref 1.15–1.40)
CHLORIDE: 104 mmol/L (ref 101–111)
CREATININE: 1.2 mg/dL — AB (ref 0.44–1.00)
Glucose, Bld: 132 mg/dL — ABNORMAL HIGH (ref 65–99)
HEMATOCRIT: 32 % — AB (ref 36.0–46.0)
Hemoglobin: 10.9 g/dL — ABNORMAL LOW (ref 12.0–15.0)
Potassium: 4 mmol/L (ref 3.5–5.1)
SODIUM: 142 mmol/L (ref 135–145)
TCO2: 24 mmol/L (ref 22–32)

## 2017-06-21 LAB — GLUCOSE, CAPILLARY: GLUCOSE-CAPILLARY: 151 mg/dL — AB (ref 65–99)

## 2017-06-21 SURGERY — CYSTOURETEROSCOPY, WITH RETROGRADE PYELOGRAM AND STENT INSERTION
Anesthesia: General | Site: Ureter | Laterality: Right

## 2017-06-21 MED ORDER — LIDOCAINE 2% (20 MG/ML) 5 ML SYRINGE
INTRAMUSCULAR | Status: AC
  Filled 2017-06-21: qty 5

## 2017-06-21 MED ORDER — MIDAZOLAM HCL 2 MG/2ML IJ SOLN
INTRAMUSCULAR | Status: DC | PRN
  Administered 2017-06-21: 2 mg via INTRAVENOUS

## 2017-06-21 MED ORDER — GABAPENTIN 300 MG PO CAPS
ORAL_CAPSULE | ORAL | Status: AC
  Filled 2017-06-21: qty 2

## 2017-06-21 MED ORDER — LACTATED RINGERS IV SOLN
INTRAVENOUS | Status: DC
  Administered 2017-06-21: 13:00:00 via INTRAVENOUS
  Filled 2017-06-21: qty 1000

## 2017-06-21 MED ORDER — ONDANSETRON HCL 4 MG/2ML IJ SOLN
INTRAMUSCULAR | Status: AC
  Filled 2017-06-21: qty 2

## 2017-06-21 MED ORDER — DEXAMETHASONE SODIUM PHOSPHATE 10 MG/ML IJ SOLN
INTRAMUSCULAR | Status: DC | PRN
  Administered 2017-06-21: 10 mg via INTRAVENOUS

## 2017-06-21 MED ORDER — KETOROLAC TROMETHAMINE 30 MG/ML IJ SOLN
INTRAMUSCULAR | Status: AC
  Filled 2017-06-21: qty 1

## 2017-06-21 MED ORDER — FENTANYL CITRATE (PF) 100 MCG/2ML IJ SOLN
INTRAMUSCULAR | Status: DC | PRN
  Administered 2017-06-21 (×2): 50 ug via INTRAVENOUS

## 2017-06-21 MED ORDER — MIDAZOLAM HCL 2 MG/2ML IJ SOLN
INTRAMUSCULAR | Status: AC
  Filled 2017-06-21: qty 2

## 2017-06-21 MED ORDER — FENTANYL CITRATE (PF) 100 MCG/2ML IJ SOLN
INTRAMUSCULAR | Status: AC
  Filled 2017-06-21: qty 4

## 2017-06-21 MED ORDER — PROPOFOL 10 MG/ML IV BOLUS
INTRAVENOUS | Status: AC
  Filled 2017-06-21: qty 40

## 2017-06-21 MED ORDER — TRAMADOL HCL 50 MG PO TABS
50.0000 mg | ORAL_TABLET | Freq: Four times a day (QID) | ORAL | 0 refills | Status: DC | PRN
Start: 2017-06-21 — End: 2017-12-02

## 2017-06-21 MED ORDER — ACETAMINOPHEN 500 MG PO TABS
1000.0000 mg | ORAL_TABLET | Freq: Once | ORAL | Status: AC
  Administered 2017-06-21: 1000 mg via ORAL
  Filled 2017-06-21: qty 2

## 2017-06-21 MED ORDER — KETOROLAC TROMETHAMINE 10 MG PO TABS: 10.0000 mg | ORAL_TABLET | Freq: Four times a day (QID) | ORAL | 1 refills | Status: DC | PRN

## 2017-06-21 MED ORDER — GENTAMICIN SULFATE 40 MG/ML IJ SOLN
5.0000 mg/kg | INTRAVENOUS | Status: DC
  Filled 2017-06-21: qty 14

## 2017-06-21 MED ORDER — DEXAMETHASONE SODIUM PHOSPHATE 10 MG/ML IJ SOLN
INTRAMUSCULAR | Status: AC
  Filled 2017-06-21: qty 1

## 2017-06-21 MED ORDER — EPHEDRINE SULFATE 50 MG/ML IJ SOLN
INTRAMUSCULAR | Status: DC | PRN
  Administered 2017-06-21: 15 mg via INTRAVENOUS

## 2017-06-21 MED ORDER — GABAPENTIN 600 MG PO TABS
600.0000 mg | ORAL_TABLET | Freq: Once | ORAL | Status: AC
  Administered 2017-06-21: 600 mg via ORAL
  Filled 2017-06-21: qty 1

## 2017-06-21 MED ORDER — ONDANSETRON HCL 4 MG/2ML IJ SOLN
INTRAMUSCULAR | Status: DC | PRN
  Administered 2017-06-21: 4 mg via INTRAVENOUS

## 2017-06-21 MED ORDER — GENTAMICIN SULFATE 40 MG/ML IJ SOLN
5.0000 mg/kg | INTRAVENOUS | Status: AC
  Administered 2017-06-21: 420 mg via INTRAVENOUS
  Filled 2017-06-21 (×2): qty 10.5

## 2017-06-21 MED ORDER — IOHEXOL 300 MG/ML  SOLN
INTRAMUSCULAR | Status: DC | PRN
  Administered 2017-06-21: 17 mL via URETHRAL

## 2017-06-21 MED ORDER — PROPOFOL 10 MG/ML IV BOLUS
INTRAVENOUS | Status: DC | PRN
  Administered 2017-06-21: 230 mg via INTRAVENOUS

## 2017-06-21 MED ORDER — CEPHALEXIN 500 MG PO CAPS: 500.0000 mg | ORAL_CAPSULE | Freq: Two times a day (BID) | ORAL | 0 refills | Status: DC

## 2017-06-21 MED ORDER — ACETAMINOPHEN 500 MG PO TABS
ORAL_TABLET | ORAL | Status: AC
  Filled 2017-06-21: qty 2

## 2017-06-21 MED ORDER — LIDOCAINE 2% (20 MG/ML) 5 ML SYRINGE
INTRAMUSCULAR | Status: DC | PRN
  Administered 2017-06-21: 60 mg via INTRAVENOUS

## 2017-06-21 SURGICAL SUPPLY — 26 items
BAG DRAIN URO-CYSTO SKYTR STRL (DRAIN) ×2 IMPLANT
BAG DRN UROCATH (DRAIN) ×1
BASKET LASER NITINOL 1.9FR (BASKET) ×1 IMPLANT
BASKET STONE NCOMPASS (UROLOGICAL SUPPLIES) ×1 IMPLANT
BSKT STON RTRVL 120 1.9FR (BASKET) ×1
CATH INTERMIT  6FR 70CM (CATHETERS) ×1 IMPLANT
CLOTH BEACON ORANGE TIMEOUT ST (SAFETY) ×2 IMPLANT
FIBER LASER FLEXIVA 365 (UROLOGICAL SUPPLIES) IMPLANT
FIBER LASER TRAC TIP (UROLOGICAL SUPPLIES) ×1 IMPLANT
GLOVE BIO SURGEON STRL SZ7.5 (GLOVE) ×2 IMPLANT
GOWN STRL REUS W/TWL LRG LVL3 (GOWN DISPOSABLE) ×2 IMPLANT
GUIDEWIRE ANG ZIPWIRE 038X150 (WIRE) ×2 IMPLANT
GUIDEWIRE STR DUAL SENSOR (WIRE) ×2 IMPLANT
INFUSOR MANOMETER BAG 3000ML (MISCELLANEOUS) ×2 IMPLANT
IV NS 1000ML (IV SOLUTION) ×2
IV NS 1000ML BAXH (IV SOLUTION) ×1 IMPLANT
IV NS IRRIG 3000ML ARTHROMATIC (IV SOLUTION) ×2 IMPLANT
KIT TURNOVER CYSTO (KITS) ×2 IMPLANT
MANIFOLD NEPTUNE II (INSTRUMENTS) ×2 IMPLANT
NS IRRIG 500ML POUR BTL (IV SOLUTION) ×4 IMPLANT
PACK CYSTO (CUSTOM PROCEDURE TRAY) ×2 IMPLANT
SHEATH URETERAL 12FRX28CM (UROLOGICAL SUPPLIES) ×1 IMPLANT
STENT POLARIS 5FRX24 (STENTS) ×1 IMPLANT
SYRINGE 10CC LL (SYRINGE) ×2 IMPLANT
TUBE CONNECTING 12X1/4 (SUCTIONS) ×1 IMPLANT
TUBE FEEDING 8FR 16IN STR KANG (MISCELLANEOUS) ×1 IMPLANT

## 2017-06-21 NOTE — Transfer of Care (Signed)
Immediate Anesthesia Transfer of Care Note  Patient: Savannah [name]  Procedure(s) Performed: CYSTOSCOPY WITH RETROGRADE PYELOGRAM, URETEROSCOPY AND STENT PLACEMENT 2ND STAGE  STENT" EXCHANGE" (Right Ureter) HOLMIUM LASER APPLICATION (Right Ureter)  Patient Location: PACU  Anesthesia Type:General  Level of Consciousness: awake, alert , oriented and patient cooperative  Airway & Oxygen Therapy: Patient Spontanous Breathing and Patient connected to nasal cannula oxygen  Post-op Assessment: Report given to RN and Post -op Vital signs reviewed and stable  Post vital signs: Reviewed and stable  Last Vitals:  Vitals Value Taken Time  BP 142/74 06/21/2017  3:58 PM  Temp    Pulse 101 06/21/2017  3:59 PM  Resp 15 06/21/2017  3:59 PM  SpO2 94 % 06/21/2017  3:59 PM  Vitals shown include unvalidated device data.  Last Pain:  Vitals:   06/21/17 1243  TempSrc:   PainSc: 5          Complications: No apparent anesthesia complications

## 2017-06-21 NOTE — Discharge Instructions (Signed)
1 - You may have urinary urgency (bladder spasms) and bloody urine on / off with stent in place. This is normal.  2 - Call MD or go to ER for fever >102, severe pain / nausea / vomiting not relieved by medications, or acute change in medical status.   Post Anesthesia Home Care Instructions  Activity: Get plenty of rest for the remainder of the day. A responsible individual must stay with you for 24 hours following the procedure.  For the next 24 hours, DO NOT: -Drive a car -Operate machinery -Drink alcoholic beverages -Take any medication unless instructed by your physician -Make any legal decisions or sign important papers.  Meals: Start with liquid foods such as gelatin or soup. Progress to regular foods as tolerated. Avoid greasy, spicy, heavy foods. If nausea and/or vomiting occur, drink only clear liquids until the nausea and/or vomiting subsides. Call your physician if vomiting continues.  Special Instructions/Symptoms: Your throat may feel dry or sore from the anesthesia or the breathing tube placed in your throat during surgery. If this causes discomfort, gargle with warm salt water. The discomfort should disappear within 24 hours.  If you had a scopolamine patch placed behind your ear for the management of post- operative nausea and/or vomiting:  1. The medication in the patch is effective for 72 hours, after which it should be removed.  Wrap patch in a tissue and discard in the trash. Wash hands thoroughly with soap and water. 2. You may remove the patch earlier than 72 hours if you experience unpleasant side effects which may include dry mouth, dizziness or visual disturbances. 3. Avoid touching the patch. Wash your hands with soap and water after contact with the patch.    [facility] [phone] Post Ureteroscopy With or Without Stent Instructions  Definitions:  Ureter: The duct that transports urine from the kidney to the bladder. Stent:   A plastic  hollow tube that is placed into the ureter, from the kidney to the                 bladder to prevent the ureter from swelling shut.  GENERAL INSTRUCTIONS:  Despite the fact that no skin incisions were used, the area around the ureter and bladder is raw and irritated. The stent is a foreign body which will further irritate the bladder wall. This irritation is manifested by increased frequency of urination, both day and night, and by an increase in the urge to urinate. In some, the urge to urinate is present almost always. Sometimes the urge is strong enough that you may not be able to stop yourself from urinating. The only real cure is to remove the stent and then give time for the bladder wall to heal which can't be done until the danger of the ureter swelling shut has passed, which varies.  You may see some blood in your urine while the stent is in place and a few days afterwards. Do not be alarmed, even if the urine was clear for a while. Get off your feet and drink lots of fluids until clearing occurs. If you start to pass clots or don't improve, call us.  DIET: You may return to your normal diet immediately. Because of the raw surface of your bladder, alcohol, spicy foods, acid type foods and drinks with caffeine may cause irritation or frequency and should be used in moderation. To keep your urine flowing freely and to avoid constipation, drink plenty of fluids during the day (   8-10 glasses ). Tip: Avoid cranberry juice because it is very acidic.  ACTIVITY: Your physical activity doesn't need to be restricted. However, if you are very active, you may see some blood in your urine. We suggest that you reduce your activity under these circumstances until the bleeding has stopped.  BOWELS: It is important to keep your bowels regular during the postoperative period. Straining with bowel movements can cause bleeding. A bowel movement every other day is reasonable. Use a mild laxative if needed, such  as Milk of Magnesia 2-3 tablespoons, or 2 Dulcolax tablets. Call if you continue to have problems. If you have been taking narcotics for pain, before, during or after your surgery, you may be constipated. Take a laxative if necessary.   MEDICATION: You should resume your pre-surgery medications unless told not to. In addition you will often be given an antibiotic to prevent infection. These should be taken as prescribed until the bottles are finished unless you are having an unusual reaction to one of the drugs.  PROBLEMS YOU SHOULD REPORT TO US:  Fevers over 100.5 Fahrenheit.  Heavy bleeding, or clots ( See above notes about blood in urine ).  Inability to urinate.  Drug reactions ( hives, rash, nausea, vomiting, diarrhea ).  Severe burning or pain with urination that is not improving.  FOLLOW-UP: You will need a follow-up appointment to monitor your progress. Call for this appointment at the number listed above. Usually the first appointment will be about three to fourteen days after your surgery.      

## 2017-06-21 NOTE — H&P (Signed)
Savannah [name] is an 62 y.o. female.    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    Post-op Surveillance:   - 11/2014 CT NED, Cr 1.2, stable Rt renal stone.  - 04/2016 CXR, [place], CMP NED, Cr 1.0 ([employer] denied CT despite peer to peer). Scattered Rt > Lt intrarenal stones w/o obstruction.  - 04/2017 CMP, CT no recurrence, Cr 1.0, About 2cm total volume Rt renal stones, no hydro, 500HU (doubled since 2016).    2 - Recurrent Nephrolithiasis - Several episodes small stone passage medically. CT 03/2014 with stable Rt sided milk of calcium appearing stone.    3 - Medical Stone Disease / Oliguria / Hyperolxaluria / Hypocitraturia - piror equivocal workup for hyperparathyroidism   Eval 2015: CMP normal; 24 Hr Urines - low volume (1.4L), high oxalate (74), low citrate (174) --> dietary modification with increased hydration and dietary citrate (did not tolerate K-Cit)    PMH sig for DM2, ortho surgery x several, obesity. NO blood thinners. Her PCP is Dr. Loanne [name].    Today " Savannah [name] " is seen to proceed with second stage ureteroscopy for large volume RIGHT renal stones. NO interval fevers. Some stent colic as expected.   Past Medical History:  Diagnosis Date  . Allergic rhinitis   . Asthma   . B12 deficiency   . Depression   . Grade I internal hemorrhoids   . Hiatal hernia   . History of colon polyps   . History of kidney stones   . History of renal cell carcinoma urologist-  dr Savannah [name]   dx 2016--  s/p 05-19-2014 partial right nephrectomy -- Clear Cell carcinoma (pT1aNxMx),  clinically  localized w/ negative margins  . Hypercholesterolemia   . Hypertension   . Hypothyroidism    endocrinologist -  dr Savannah [name]  . Iron deficiency anemia   .  Lumbar spondylosis   . NAFLD (nonalcoholic fatty liver disease)    dx 2010--- last hepatic panel in epic dated 04-17-2017  . Nocturia   . OA (osteoarthritis of spine)    Lumbar  . OSA on CPAP pulmologist-  dr Savannah [name]   sleep study 09-11-2004  very severe osa , AHI 119/hr,  setting 10  . Peripheral neuropathy    bottom of feet  . Renal calculus, right   . Type 2 diabetes mellitus (Savannah [name])    endocrinoloigst-  dr Savannah [name],  last A1c 5.9 on 02-20-219 in epic  . Vitamin B 12 deficiency   . Wears glasses     Past Surgical History:  Procedure Laterality Date  . BUNIONECTOMY/  HAMMERTOE CORRECTION'S  09-07-2013   SCG   RIGHT FOOT 2ND, 3RD, 4TH, 5TH DIGITS  . CARDIAC CATHETERIZATION  06-01-2009  dr Angelena Form   no evidence CAD,  normal LVSF, elevated blood pressure w/ elevated end-diastolic pressure, ef 38-75%  . CARDIOVASCULAR STRESS TEST  05-11-2009   dr Angelena Form   abnormal lexiscan nuclear study (no exercise) w/ mild ischemia in the distal anteroseptal wall and apex, this defect may be due to shifting breast attenuation  . COLONOSCOPY WITH SNARE POLYPECTOMY WITH ESOPHAGOGASTRODUODENOSCOPY  02/08/2014   with biopsy Dr. Jerilynn Mages. Fuller Plan  . CYSTOSCOPY WITH RETROGRADE PYELOGRAM, URETEROSCOPY AND STENT PLACEMENT Right 06/07/2017   Procedure: CYSTOSCOPY WITH RETROGRADE PYELOGRAM, URETEROSCOPY, LITHOTRIPSY,  AND STENT PLACEMENT 1st STAGE;  Surgeon: Alexis Frock, MD;  Location: [facility];  Service: Urology;  Laterality: Right;  . FRACTURE SURGERY Right    Compound fracture foot  . HOLMIUM LASER APPLICATION Right 9/37/3428   Procedure: HOLMIUM LASER APPLICATION;  Surgeon: Alexis Frock, MD;  Location: [facility];  Service: Urology;  Laterality: Right;  . KNEE ARTHROSCOPY Left 1993  . ROBOTIC ASSITED PARTIAL NEPHRECTOMY Right 05/19/2014   Procedure: ROBOTIC ASSITED PARTIAL NEPHRECTOMY;  Surgeon: Alexis Frock, MD;  Location: [facility];  Service: Urology;  Laterality: Right;     Family History  Problem Relation Age of Onset  . Colon cancer Mother 86  . Diabetes Mother   . Kidney disease Mother   . Heart attack Father   . Heart disease Father   . Allergies Sister   . Asthma Sister   . Clotting disorder Grandchild   . Heart disease Paternal Grandmother   . Esophageal cancer Neg Hx   . Rectal cancer Neg Hx   . Stomach cancer Neg Hx    Social History:  reports that she quit smoking about 42 years ago. Her smoking use included cigarettes. She has a 0.60 pack-year smoking history. She has never used smokeless tobacco. She reports that she does not drink alcohol or use drugs.  Allergies:  Allergies  Allergen Reactions  . Erythromycin Nausea And Vomiting    No medications prior to admission.    No results found for this or any previous visit (from the past 48 hour(s)). No results found.  Review of Systems  Constitutional: Negative.  Negative for fever.  HENT: Negative.   Eyes: Negative.   Respiratory: Negative.   Cardiovascular: Negative.   Gastrointestinal: Negative.   Genitourinary: Positive for hematuria and urgency.  Musculoskeletal: Negative.   Skin: Negative.   Neurological: Negative.   Endo/Heme/Allergies: Negative.   Psychiatric/Behavioral: Negative.     Height 5' 8.5" (1.74 m), weight 110.7 kg (244 lb). Physical Exam  Constitutional: She appears well-developed.  HENT:  Head: Normocephalic.  Eyes: Pupils are equal, round, and reactive to light.  Neck: Normal range of motion.  Cardiovascular: Normal rate.  Respiratory: Effort normal.  GI: Soft.  Stable truncal obesity. Prior scars w/o hernias.   Genitourinary:  Genitourinary Comments: NO CVAT  Musculoskeletal: Normal range of motion.  Neurological: She is alert.  Skin: Skin is warm.  Psychiatric: She has a normal mood and affect.     Assessment/Plan  Proceed as planned with RIGHT 2nd stage ureteroscopic stone manipulation. RIsks, benefits, alternatives, expected peri-op  course discussed previously and reiterated today.   Alexis Frock, MD 06/21/2017, 7:00 AM

## 2017-06-21 NOTE — Brief Op Note (Signed)
06/21/2017  3:47 PM  PATIENT:  Savannah [name]  62 y.o. female  PRE-OPERATIVE DIAGNOSIS:  LARGE RIGHT RENAL STONE  POST-OPERATIVE DIAGNOSIS:  LARGE RIGHT RENAL STONE  PROCEDURE:  Procedure(s): CYSTOSCOPY WITH RETROGRADE PYELOGRAM, URETEROSCOPY AND STENT PLACEMENT 2ND STAGE  STENT" EXCHANGE" (Right) HOLMIUM LASER APPLICATION (Right)  SURGEON:  Surgeon(s) and Role:    Alexis Frock, MD - Primary  PHYSICIAN ASSISTANT:   ASSISTANTS: none   ANESTHESIA:   general  EBL:  minimal   BLOOD ADMINISTERED:none  DRAINS: none   LOCAL MEDICATIONS USED:  NONE  SPECIMEN:  Source of Specimen:  Rt renal stone fragmetns  DISPOSITION OF SPECIMEN:  discard  COUNTS:  YES  TOURNIQUET:  * No tourniquets in log *  DICTATION: .Other Dictation: Dictation Number [id number]  PLAN OF CARE: Discharge to home after PACU  PATIENT DISPOSITION:  PACU - hemodynamically stable.   Delay start of Pharmacological VTE agent (>24hrs) due to surgical blood loss or risk of bleeding: yes

## 2017-06-21 NOTE — Anesthesia Procedure Notes (Signed)
Procedure Name: LMA Insertion Date/Time: 06/21/2017 2:59 PM Performed by: Wanita Chamberlain, CRNA Pre-anesthesia Checklist: Patient identified, Timeout performed, Emergency Drugs available, Suction available and Patient being monitored Patient Re-evaluated:Patient Re-evaluated prior to induction Oxygen Delivery Method: Circle system utilized Preoxygenation: Pre-oxygenation with 100% oxygen Induction Type: IV induction Ventilation: Mask ventilation without difficulty LMA: LMA inserted LMA Size: 4.0 Number of attempts: 1 Airway Equipment and Method: Bite block Placement Confirmation: breath sounds checked- equal and bilateral,  CO2 detector and positive ETCO2 Tube secured with: Tape Dental Injury: Teeth and Oropharynx as per pre-operative assessment

## 2017-06-21 NOTE — Anesthesia Postprocedure Evaluation (Signed)
Anesthesia Post Note  Patient: Savannah [name]  Procedure(s) Performed: CYSTOSCOPY WITH RETROGRADE PYELOGRAM, URETEROSCOPY AND STENT PLACEMENT 2ND STAGE  STENT" EXCHANGE" (Right Ureter) HOLMIUM LASER APPLICATION (Right Ureter)     Patient location during evaluation: PACU Anesthesia Type: General Level of consciousness: awake and alert Pain management: pain level controlled Vital Signs Assessment: post-procedure vital signs reviewed and stable Respiratory status: spontaneous breathing, nonlabored ventilation and respiratory function stable Cardiovascular status: blood pressure returned to baseline and stable Postop Assessment: no apparent nausea or vomiting Anesthetic complications: no    Last Vitals:  Vitals:   06/21/17 1600 06/21/17 1615  BP: (!) 148/64   Pulse: (!) 101 92  Resp: 15 20  Temp:    SpO2: 94% 91%    Last Pain:  Vitals:   06/21/17 1645  TempSrc:   PainSc: 0-No pain                 Catalina Gravel

## 2017-06-21 NOTE — Anesthesia Preprocedure Evaluation (Addendum)
Anesthesia Evaluation  Patient identified by MRN, date of birth, ID band Patient awake    Reviewed: Allergy & Precautions, NPO status , Patient's Chart, lab work & pertinent test results  Airway Mallampati: III  TM Distance: >3 FB Neck ROM: Full    Dental  (+) Dental Advisory Given, Teeth Intact   Pulmonary asthma , sleep apnea and Continuous Positive Airway Pressure Ventilation , former smoker,    breath sounds clear to auscultation       Cardiovascular hypertension, Pt. on medications and Pt. on home beta blockers  Rhythm:Regular Rate:Normal     Neuro/Psych Depression  Neuromuscular disease    GI/Hepatic hiatal hernia, PUD, NASH   Endo/Other  diabetes, Type 2, Oral Hypoglycemic AgentsHypothyroidism   Renal/GU Renal disease (s/p right partial nephrectomy)     Musculoskeletal  (+) Arthritis ,   Abdominal   Peds  Hematology  (+) anemia ,   Anesthesia Other Findings   Reproductive/Obstetrics                          Anesthesia Physical  Anesthesia Plan  ASA: III  Anesthesia Plan: General   Post-op Pain Management:    Induction: Intravenous  PONV Risk Score and Plan: 3 and Dexamethasone, Ondansetron, Midazolam and Treatment may vary due to age or medical condition  Airway Management Planned: LMA  Additional Equipment:   Intra-op Plan:   Post-operative Plan: Extubation in OR  Informed Consent: I have reviewed the patients History and Physical, chart, labs and discussed the procedure including the risks, benefits and alternatives for the proposed anesthesia with the patient or authorized representative who has indicated his/her understanding and acceptance.   Dental advisory given  Plan Discussed with: CRNA, Anesthesiologist and Surgeon  Anesthesia Plan Comments:        Anesthesia Quick Evaluation

## 2017-06-24 ENCOUNTER — Encounter (HOSPITAL_BASED_OUTPATIENT_CLINIC_OR_DEPARTMENT_OTHER): Payer: Self-pay | Admitting: Urology

## 2017-06-24 NOTE — Op Note (Signed)
NAME:  Savannah [name], Savannah [name]                  ACCOUNT NO.:  MEDICAL RECORD NO.:  [id number]  LOCATION:                                 FACILITY:  PHYSICIAN:  Alexis Frock, MD          DATE OF BIRTH:  DATE OF PROCEDURE: 06/21/2017                              OPERATIVE REPORT   DIAGNOSIS:  Residual right renal stone.  PROCEDURES: 1. Second-stage right ureteroscopy with laser lithotripsy. 2. Exchange of right ureteral stent, 5 x 24 Polaris with tether. 3. Right retrograde pyelogram with interpretation.  ESTIMATED BLOOD LOSS:  Nil.  COMPLICATIONS:  None.  SPECIMEN:  Right renal stone fragments for discard.  FINDINGS: 1. Approximately 1 cm to 1.2 cm2 total residual stone volume. 2. Complete resolution of all stone fragments larger than 1/3rd mm     following laser lithotripsy and basket extraction. 3. Successful replacement of right ureteral stent, proximal end in the     renal pelvis and distal end in the urinary bladder.  INDICATION:  The patient is a very pleasant 62 year old lady with history of recurrent nephrolithiasis as well as prior renal cell carcinoma.  She has been on surveillance of both of these things and has been cancer free for quite some time; however, she has had a slow recurrence of right renal stone, now volume approximately 2 to 2.5 cm in effort to prevent progression to major surgery as well as percutaneous surgery.  We discussed possible preemptive treatment with staged ureteroscopy and she wished to proceed.  She underwent first-stage procedure approximately 2 weeks ago, which was estimated approximately 60% of her stone volume was removed or ablated and she now presents for second-stage procedure today.  Informed consent was obtained and placed in the medical record.  PROCEDURE IN DETAIL:  The patient was verified.  Procedure being right second-stage ureteroscopic stone manipulation was confirmed.  Procedure was carried out.  Time-out was performed.   Intravenous antibiotics were administered.  General LMA anesthesia was introduced.  The patient was placed into a low lithotomy position, and sterile field was created by prepping and draping the patient's vagina, introitus and proximal thighs using iodine.  Next, cystourethroscopy was performed using a 22-French rigid cytoscope with offset lens.  Inspection of the urinary bladder revealed distal end of the ureteral stent from the right side in situ, this was grasped, removed in its entirety, set aside for discard.  The right ureteral orifice was then cannulated with a 6-French end-hole catheter and right retrograde pyelogram was obtained.  Right retrograde pyelogram demonstrated a single right ureter with single-system right kidney.  A 0.038 Zip wire was advanced to the level of the upper pole, set aside as a safety wire.  An 8-French feeding tube placed in the urinary bladder for pressure release.  Next, semi-rigid ureteroscopy was performed in the distal four-fifth of the right ureter alongside a separate Sensor working wire and no mucosal abnormalities were found.  The semi-rigid scope was exchanged for a 12/14, 24-cm ureteral access sheath at the level of proximal ureter using continuous fluoroscopic guidance and flexible digital ureteroscopy was performed at the proximal right ureter and systematic inspection of the  right kidney including all calices x3.  In the upper pole, there was a dominant conglomerative stone approximately 1 cm2 total, this appeared to be much too large for simple basketing.  As such, holmium laser energy was applied to the stone using settings of 0.2 joules and 30 hertz using a dusting technique, and all larger fragments were dusted into much smaller pieces.  Then, an Encompass basket was used sequentially to remove all stone fragments larger than 1/3rd mm.  This was performed for approximately 45 minutes.  Following these maneuvers, there was  complete resolution of all stone fragments larger than 1/3rd mm.  Excellent hemostasis.  No evidence of renal perforation.  All calices were carefully inspected again.  Given that she does have some residual stone dust in the kidney, it was felt that interval stenting without tether would be most advantageous to allow for proper clearance.  The access sheath was removed under continuous vision and a new 5 x 24 Polaris-type stent was placed using fluoroscopic guidance.  Good proximal and distal deployment were noted, and the procedure was terminated.  The patient tolerated the procedure well.  There were no immediate periprocedural complications.  The patient was taken to the Postanesthesia Care unit in stable condition.          ______________________________ Alexis Frock, MD     TM/MEDQ  D:  06/21/2017  T:  06/22/2017  Job:  [id number]

## 2017-07-18 ENCOUNTER — Ambulatory Visit (INDEPENDENT_AMBULATORY_CARE_PROVIDER_SITE_OTHER): Payer: BLUE CROSS/BLUE SHIELD

## 2017-07-18 DIAGNOSIS — E538 Deficiency of other specified B group vitamins: Secondary | ICD-10-CM

## 2017-07-18 MED ORDER — CYANOCOBALAMIN 1000 MCG/ML IJ SOLN
1000.0000 ug | Freq: Once | INTRAMUSCULAR | Status: AC
  Administered 2017-07-18: 1000 ug via INTRAMUSCULAR

## 2017-07-19 ENCOUNTER — Other Ambulatory Visit: Payer: Self-pay | Admitting: Endocrinology

## 2017-08-20 ENCOUNTER — Ambulatory Visit (INDEPENDENT_AMBULATORY_CARE_PROVIDER_SITE_OTHER): Payer: BLUE CROSS/BLUE SHIELD

## 2017-08-20 DIAGNOSIS — E538 Deficiency of other specified B group vitamins: Secondary | ICD-10-CM

## 2017-08-20 MED ORDER — CYANOCOBALAMIN 1000 MCG/ML IJ SOLN
1000.0000 ug | Freq: Once | INTRAMUSCULAR | Status: AC
  Administered 2017-08-20: 1000 ug via INTRAMUSCULAR

## 2017-08-20 NOTE — Progress Notes (Signed)
Per orders of Dr. Loanne Drilling injection of b12 given today by Lolita Rieger RMA. Patient tolerated injection well.

## 2017-09-19 ENCOUNTER — Ambulatory Visit (INDEPENDENT_AMBULATORY_CARE_PROVIDER_SITE_OTHER): Payer: BLUE CROSS/BLUE SHIELD

## 2017-09-19 DIAGNOSIS — E538 Deficiency of other specified B group vitamins: Secondary | ICD-10-CM | POA: Diagnosis not present

## 2017-09-19 MED ORDER — CYANOCOBALAMIN 1000 MCG/ML IJ SOLN
1000.0000 ug | Freq: Once | INTRAMUSCULAR | Status: AC
  Administered 2017-09-19: 1000 ug via INTRAMUSCULAR

## 2017-09-19 NOTE — Addendum Note (Signed)
Addended by: Nile Riggs on: 09/19/2017 11:09 AM   Modules accepted: Orders

## 2017-09-19 NOTE — Progress Notes (Signed)
Per orders of Dr. Loanne Drilling injection of b-12 was given today by Linus Galas CMA. Patient tolerated injection well.

## 2017-09-26 NOTE — Progress Notes (Signed)
Per orders of Dr. Loanne Drilling injection of B12 given today by Sarah/CMA . Patient tolerated injection well.

## 2017-10-14 ENCOUNTER — Other Ambulatory Visit: Payer: Self-pay | Admitting: Endocrinology

## 2017-10-15 ENCOUNTER — Encounter: Payer: Self-pay | Admitting: Endocrinology

## 2017-10-15 ENCOUNTER — Ambulatory Visit: Payer: BLUE CROSS/BLUE SHIELD | Admitting: Endocrinology

## 2017-10-15 VITALS — BP 128/78 | HR 80 | Wt 235.8 lb

## 2017-10-15 DIAGNOSIS — E039 Hypothyroidism, unspecified: Secondary | ICD-10-CM | POA: Diagnosis not present

## 2017-10-15 DIAGNOSIS — E119 Type 2 diabetes mellitus without complications: Secondary | ICD-10-CM | POA: Diagnosis not present

## 2017-10-15 DIAGNOSIS — E559 Vitamin D deficiency, unspecified: Secondary | ICD-10-CM | POA: Diagnosis not present

## 2017-10-15 LAB — POCT GLYCOSYLATED HEMOGLOBIN (HGB A1C): Hemoglobin A1C: 5.6 % (ref 4.0–5.6)

## 2017-10-15 LAB — TSH: TSH: 0.7 u[IU]/mL (ref 0.35–4.50)

## 2017-10-15 LAB — VITAMIN D 25 HYDROXY (VIT D DEFICIENCY, FRACTURES): VITD: 78.77 ng/mL (ref 30.00–100.00)

## 2017-10-15 NOTE — Patient Instructions (Signed)
blood tests are requested for you today.  We'll let you know about the results.   Please continue the same medications.

## 2017-10-15 NOTE — Progress Notes (Signed)
Subjective:    Patient ID: Savannah [name], female    DOB: May 29, 1955, 62 y.o.   MRN: [id number]  HPI Pt returns for f/u of diabetes mellitus: DM type: 2 Dx'ed: 1027 Complications: none Therapy: metformin GDM: never DKA: never Severe hypoglycemia: never Pancreatitis: never Pancreatic imaging: normal on 2013 [place] Other: she has never been on insulin. Interval history: pt states she feels well in general.  She takes meds.  She takes vit-D, 2000 units per day Past Medical History:  Diagnosis Date  . Allergic rhinitis   . Asthma   . B12 deficiency   . Depression   . Grade I internal hemorrhoids   . Hiatal hernia   . History of colon polyps   . History of kidney stones   . 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 Hypercholesterolemia   . Hypertension   . Hypothyroidism    endocrinologist -  dr Hale Bogus  . Iron deficiency anemia   . Lumbar spondylosis   . NAFLD (nonalcoholic fatty liver disease)    dx 2010--- last hepatic panel in epic dated 04-17-2017  . Nocturia   . OA (osteoarthritis of spine)    Lumbar  . OSA on CPAP pulmologist-  dr sood   sleep study 09-11-2004  very severe osa , AHI 119/hr,  setting 10  . Peripheral neuropathy    bottom of feet  . Renal calculus, right   . Type 2 diabetes mellitus ([place])    endocrinoloigst-  dr Loanne Drilling,  last A1c 5.9 on 02-20-219 in epic  . Vitamin B 12 deficiency   . Wears glasses     Past Surgical History:  Procedure Laterality Date  . BUNIONECTOMY/  HAMMERTOE CORRECTION'S  09-07-2013   SCG   RIGHT FOOT 2ND, 3RD, 4TH, 5TH DIGITS  . CARDIAC CATHETERIZATION  06-01-2009  dr Angelena Form   no evidence CAD,  normal LVSF, elevated blood pressure w/ elevated end-diastolic pressure, ef 25-36%  . CARDIOVASCULAR STRESS TEST  05-11-2009   dr Angelena Form   abnormal lexiscan nuclear study (no  exercise) w/ mild ischemia in the distal anteroseptal wall and apex, this defect may be due to shifting breast attenuation  . COLONOSCOPY WITH SNARE POLYPECTOMY WITH ESOPHAGOGASTRODUODENOSCOPY  02/08/2014   with biopsy Dr. Jerilynn Mages. [facility] Plan  . CYSTOSCOPY WITH RETROGRADE PYELOGRAM, URETEROSCOPY AND STENT PLACEMENT Right 06/07/2017   Procedure: CYSTOSCOPY WITH RETROGRADE PYELOGRAM, URETEROSCOPY, LITHOTRIPSY,  AND STENT PLACEMENT 1st STAGE;  Surgeon: Alexis Frock, MD;  Location: [facility];  Service: Urology;  Laterality: Right;  . CYSTOSCOPY WITH RETROGRADE PYELOGRAM, URETEROSCOPY AND STENT PLACEMENT Right 06/21/2017   Procedure: CYSTOSCOPY WITH RETROGRADE PYELOGRAM, URETEROSCOPY AND STENT PLACEMENT 2ND STAGE  STENT" EXCHANGE";  Surgeon: Alexis Frock, MD;  Location: [facility];  Service: Urology;  Laterality: Right;  . FRACTURE SURGERY Right    Compound fracture foot  . HOLMIUM LASER APPLICATION Right 6/44/0347   Procedure: HOLMIUM LASER APPLICATION;  Surgeon: Alexis Frock, MD;  Location: [facility];  Service: Urology;  Laterality: Right;  . HOLMIUM LASER APPLICATION Right 06/20/9561   Procedure: HOLMIUM LASER APPLICATION;  Surgeon: Alexis Frock, MD;  Location: [facility];  Service: Urology;  Laterality: Right;  . KNEE ARTHROSCOPY Left 1993  . ROBOTIC ASSITED PARTIAL NEPHRECTOMY Right 05/19/2014   Procedure: ROBOTIC ASSITED PARTIAL NEPHRECTOMY;  Surgeon: Alexis Frock,  MD;  Location: [facility];  Service: Urology;  Laterality: Right;    Social History   Socioeconomic History  . Marital status: Married    Spouse name: Not on file  . Number of children: 2  . Years of education: Not on file  . Highest education level: Not on file  Occupational History  . Occupation: Scientist, research (physical sciences): UNEMPLOYED  Social Needs  . Financial resource strain: Not on file  . Food insecurity:    Worry: Not on file    Inability: Not on file  .  Transportation needs:    Medical: Not on file    Non-medical: Not on file  Tobacco Use  . Smoking status: Former Smoker    Packs/day: 0.30    Years: 2.00    Pack years: 0.60    Types: Cigarettes    Last attempt to quit: 02/27/1975    Years since quitting: 42.6  . Smokeless tobacco: Never Used  Substance and Sexual Activity  . Alcohol use: No  . Drug use: No  . Sexual activity: Not on file  Lifestyle  . Physical activity:    Days per week: Not on file    Minutes per session: Not on file  . Stress: Not on file  Relationships  . Social connections:    Talks on phone: Not on file    Gets together: Not on file    Attends religious service: Not on file    Active member of club or organization: Not on file    Attends meetings of clubs or organizations: Not on file    Relationship status: Not on file  . Intimate partner violence:    Fear of current or ex partner: Not on file    Emotionally abused: Not on file    Physically abused: Not on file    Forced sexual activity: Not on file  Other Topics Concern  . 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 albuterol (PROAIR HFA) 108 (90 BASE) MCG/ACT inhaler INHALE 2 PUFFS EVERY 4 HOURS AS NEEDED FOR WHEEZING, COUGH & SHORTNESS OF BREATH (Patient taking differently: Inhale 2 puffs into the lungs every 4 (four) hours as needed for wheezing or shortness of breath (cough). ) 8.5 Inhaler 11  . atorvastatin (LIPITOR) 40 MG tablet TAKE 1 TABLET BY MOUTH EVERY DAY (Patient taking differently: TAKE 1 TABLET BY MOUTH EVERY DAY--- takes in am) 90 tablet 1  . cholecalciferol (VITAMIN D) 1000 units tablet Take 1,000 Units by mouth 2 (two) times daily.    . citalopram (CELEXA) 20 MG tablet TAKE 1 TABLET BY MOUTH EVERY DAY (Patient taking differently: TAKE 1 TABLET BY MOUTH EVERY DAY--- takes in am) 90 tablet 3  .  Cyanocobalamin (B-12 COMPLIANCE INJECTION IJ) Inject as directed every 30 (thirty) days.    . ferrous sulfate 325 (65 FE) MG tablet Take 325 mg by mouth daily with breakfast.    . Fluticasone-Salmeterol (ADVAIR) 100-50 MCG/DOSE AEPB Inhale 1 puff into the lungs 2 (two) times daily. (Patient taking differently: Inhale 1 puff into the lungs 2 (two) times daily. ) 1 each 11  . gabapentin (NEURONTIN) 300 MG capsule TAKE 1 CAPSULE BY MOUTH EVERYDAY AT BEDTIME 90 capsule 1  . glucose blood (ONETOUCH VERIO) test strip Use to check blood sugar 1 time per day. 100 each  3  . hyoscyamine (LEVSIN SL) 0.125 MG SL tablet Take 1-2 tablets by mouth or under tongue every 4 hours as needed. (Patient taking differently: Take 0.125-0.25 mg by mouth every 4 (four) hours as needed for cramping (stomach spasms). ) 100 tablet 5  . ketorolac (TORADOL) 10 MG tablet Take 1 tablet (10 mg total) by mouth every 6 (six) hours as needed for moderate pain. Or stent discomfort post-operatively. Pt has received prior. 20 tablet 1  . levothyroxine (SYNTHROID, LEVOTHROID) 137 MCG tablet Take 1 tablet (137 mcg total) by mouth daily before breakfast. 90 tablet 3  . losartan-hydrochlorothiazide (HYZAAR) 100-12.5 MG tablet TAKE 1 TABLET BY MOUTH EVERY DAY (Patient taking differently: TAKE 1 TABLET BY MOUTH EVERY DAY--- takes in am) 90 tablet 3  . metFORMIN (GLUCOPHAGE-XR) 500 MG 24 hr tablet TAKE 2 TABLETS BY MOUTH TWICE A DAY (Patient taking differently: Take 1,000 mg by mouth 2 (two) times daily. TAKE 2 TABLETS BY MOUTH TWICE A DAY) 360 tablet 2  . metoprolol succinate (TOPROL-XL) 25 MG 24 hr tablet Take 0.5 tablets (12.5 mg total) by mouth daily. (Patient taking differently: Take 12.5 mg by mouth every morning. ) 45 tablet 3  . ONETOUCH DELICA LANCETS FINE MISC Use to check blood sugar 1 time per day. 100 each 2  . pioglitazone (ACTOS) 15 MG tablet TAKE 1 TABLET BY MOUTH EVERY DAY 90 tablet 0  . traZODone (DESYREL) 100 MG tablet TAKE 1  TABLET BY MOUTH EVERY DAY AT BEDTIME 90 tablet 1  . cephALEXin (KEFLEX) 500 MG capsule Take 1 capsule (500 mg total) by mouth 2 (two) times daily. X 3 days. Begin day before next Urology appointment. 6 capsule 0  . traMADol (ULTRAM) 50 MG tablet Take 1-2 tablets (50-100 mg total) by mouth every 6 (six) hours as needed for severe pain. Post-operatively. (Patient not taking: Reported on 10/15/2017) 20 tablet 0   No current facility-administered medications on file prior to visit.     Allergies  Allergen Reactions  . Erythromycin Nausea And Vomiting    Family History  Problem Relation Age of Onset  . Colon cancer Mother 57  . Diabetes Mother   . Kidney disease Mother   . Heart attack Father   . Heart disease Father   . Allergies Sister   . Asthma Sister   . Clotting disorder Grandchild   . Heart disease Paternal Grandmother   . Esophageal cancer Neg Hx   . Rectal cancer Neg Hx   . Stomach cancer Neg Hx     BP 128/78 (BP Location: Right Arm, Patient Position: Sitting, Cuff Size: Normal)   Pulse 80   Wt 235 lb 12.8 oz (107 kg)   SpO2 94%   BMI 35.33 kg/m   Review of Systems She has fatigue and leg cramps    Objective:   Physical Exam VITAL SIGNS:  See vs page.  GENERAL: no distress.   Pulses: foot pulses are intact bilaterally.   MSK: no deformity of the feet or ankles.  CV: no edema of the legs or ankles.   Skin:  no ulcer on the feet or ankles, but there is an abrasion at the tip of the right great toe.  normal color and temp on the feet and ankles Neuro: sensation is intact to touch on the feet and ankles.     Lab Results  Component Value Date   HGBA1C 5.6 10/15/2017       Assessment & Plan:  Type 2  DM: well-controlled Hypocalcemia: check vit-D Fatigue: check TSH  Patient Instructions  blood tests are requested for you today.  We'll let you know about the results.   Please continue the same medications.

## 2017-10-22 ENCOUNTER — Ambulatory Visit (INDEPENDENT_AMBULATORY_CARE_PROVIDER_SITE_OTHER): Payer: BLUE CROSS/BLUE SHIELD

## 2017-10-22 DIAGNOSIS — E559 Vitamin D deficiency, unspecified: Secondary | ICD-10-CM

## 2017-10-22 MED ORDER — CYANOCOBALAMIN 1000 MCG/ML IJ SOLN
1000.0000 ug | Freq: Once | INTRAMUSCULAR | Status: AC
  Administered 2017-10-22: 1000 ug via INTRAMUSCULAR

## 2017-10-22 NOTE — Progress Notes (Signed)
Per orders of Dr.Ellison , injection of B12 given by Elmon Kirschner. Patient tolerated injection well. Right Deltoid

## 2017-10-25 ENCOUNTER — Other Ambulatory Visit: Payer: Self-pay | Admitting: Endocrinology

## 2017-11-07 ENCOUNTER — Ambulatory Visit (INDEPENDENT_AMBULATORY_CARE_PROVIDER_SITE_OTHER): Payer: BLUE CROSS/BLUE SHIELD

## 2017-11-07 DIAGNOSIS — Z23 Encounter for immunization: Secondary | ICD-10-CM

## 2017-11-10 ENCOUNTER — Other Ambulatory Visit: Payer: Self-pay | Admitting: Endocrinology

## 2017-11-19 ENCOUNTER — Telehealth: Payer: Self-pay | Admitting: Internal Medicine

## 2017-11-19 NOTE — Telephone Encounter (Signed)
Ok with me 

## 2017-11-19 NOTE — Telephone Encounter (Signed)
Copied from [place] [id number]. Topic: Inquiry >> Nov 19, 2017  1:13 PM Valla Leaver wrote: Reason for CRM: Patient says Dr. Jenny Reichmann told her he'd accept her as a new patient. He see's her mom, Irena Reichmann DOB04/18/1935. Please call to notify approval or denial.

## 2017-11-21 ENCOUNTER — Ambulatory Visit (INDEPENDENT_AMBULATORY_CARE_PROVIDER_SITE_OTHER): Payer: BLUE CROSS/BLUE SHIELD

## 2017-11-21 DIAGNOSIS — E538 Deficiency of other specified B group vitamins: Secondary | ICD-10-CM | POA: Diagnosis not present

## 2017-11-21 MED ORDER — CYANOCOBALAMIN 1000 MCG/ML IJ SOLN
1000.0000 ug | Freq: Once | INTRAMUSCULAR | Status: AC
  Administered 2017-11-21: 1000 ug via INTRAMUSCULAR

## 2017-11-21 NOTE — Telephone Encounter (Signed)
Appt made

## 2017-11-21 NOTE — Progress Notes (Signed)
Per orders of  Dr Loanne Drilling .injection of 1040mcg/ml B12 given today by Lolita Rieger RMA . Patient tolerated injection well.

## 2017-12-02 ENCOUNTER — Ambulatory Visit: Payer: BLUE CROSS/BLUE SHIELD | Admitting: Internal Medicine

## 2017-12-02 ENCOUNTER — Encounter: Payer: Self-pay | Admitting: Internal Medicine

## 2017-12-02 ENCOUNTER — Other Ambulatory Visit (INDEPENDENT_AMBULATORY_CARE_PROVIDER_SITE_OTHER): Payer: BLUE CROSS/BLUE SHIELD

## 2017-12-02 VITALS — BP 124/80 | HR 73 | Temp 98.1°F | Ht 68.5 in | Wt 241.0 lb

## 2017-12-02 DIAGNOSIS — E119 Type 2 diabetes mellitus without complications: Secondary | ICD-10-CM | POA: Diagnosis not present

## 2017-12-02 DIAGNOSIS — Z0000 Encounter for general adult medical examination without abnormal findings: Secondary | ICD-10-CM

## 2017-12-02 LAB — URINALYSIS, ROUTINE W REFLEX MICROSCOPIC
Bilirubin Urine: NEGATIVE
Hgb urine dipstick: NEGATIVE
Ketones, ur: NEGATIVE
NITRITE: NEGATIVE
PH: 5.5 (ref 5.0–8.0)
SPECIFIC GRAVITY, URINE: 1.02 (ref 1.000–1.030)
Total Protein, Urine: NEGATIVE
Urine Glucose: NEGATIVE
Urobilinogen, UA: 0.2 (ref 0.0–1.0)

## 2017-12-02 LAB — HEPATIC FUNCTION PANEL
ALT: 10 U/L (ref 0–35)
AST: 13 U/L (ref 0–37)
Albumin: 4.2 g/dL (ref 3.5–5.2)
Alkaline Phosphatase: 68 U/L (ref 39–117)
BILIRUBIN DIRECT: 0.2 mg/dL (ref 0.0–0.3)
BILIRUBIN TOTAL: 1.3 mg/dL — AB (ref 0.2–1.2)
Total Protein: 7.1 g/dL (ref 6.0–8.3)

## 2017-12-02 LAB — CBC WITH DIFFERENTIAL/PLATELET
BASOS ABS: 0.1 10*3/uL (ref 0.0–0.1)
Basophils Relative: 0.8 % (ref 0.0–3.0)
EOS ABS: 0.2 10*3/uL (ref 0.0–0.7)
Eosinophils Relative: 2.4 % (ref 0.0–5.0)
HCT: 35.1 % — ABNORMAL LOW (ref 36.0–46.0)
Hemoglobin: 12 g/dL (ref 12.0–15.0)
LYMPHS ABS: 1.8 10*3/uL (ref 0.7–4.0)
Lymphocytes Relative: 24.8 % (ref 12.0–46.0)
MCHC: 34.2 g/dL (ref 30.0–36.0)
MCV: 88.4 fl (ref 78.0–100.0)
MONO ABS: 0.4 10*3/uL (ref 0.1–1.0)
Monocytes Relative: 5.1 % (ref 3.0–12.0)
NEUTROS PCT: 66.9 % (ref 43.0–77.0)
Neutro Abs: 5 10*3/uL (ref 1.4–7.7)
Platelets: 218 10*3/uL (ref 150.0–400.0)
RBC: 3.97 Mil/uL (ref 3.87–5.11)
RDW: 14 % (ref 11.5–15.5)
WBC: 7.4 10*3/uL (ref 4.0–10.5)

## 2017-12-02 LAB — BASIC METABOLIC PANEL
BUN: 26 mg/dL — AB (ref 6–23)
CO2: 30 meq/L (ref 19–32)
Calcium: 9.2 mg/dL (ref 8.4–10.5)
Chloride: 102 mEq/L (ref 96–112)
Creatinine, Ser: 1.15 mg/dL (ref 0.40–1.20)
GFR: 50.6 mL/min — AB (ref >60.00)
GLUCOSE: 140 mg/dL — AB (ref 70–99)
Potassium: 3.8 mEq/L (ref 3.5–5.1)
SODIUM: 139 meq/L (ref 135–145)

## 2017-12-02 LAB — LIPID PANEL
CHOL/HDL RATIO: 3
Cholesterol: 135 mg/dL (ref 0–200)
HDL: 51.9 mg/dL (ref >39.00)
LDL Cholesterol: 51 mg/dL (ref 0–99)
NONHDL: 82.84
Triglycerides: 159 mg/dL — ABNORMAL HIGH (ref 0.0–149.0)
VLDL: 31.8 mg/dL (ref 0.0–40.0)

## 2017-12-02 MED ORDER — ALBUTEROL SULFATE HFA 108 (90 BASE) MCG/ACT IN AERS: 2.0000 | INHALATION_SPRAY | RESPIRATORY_TRACT | 3 refills | Status: DC | PRN

## 2017-12-02 MED ORDER — LEVOTHYROXINE SODIUM 137 MCG PO TABS: 137.0000 ug | ORAL_TABLET | Freq: Every day | ORAL | 3 refills | Status: DC

## 2017-12-02 MED ORDER — GABAPENTIN 300 MG PO CAPS: ORAL_CAPSULE | ORAL | 3 refills | Status: DC

## 2017-12-02 MED ORDER — ATORVASTATIN CALCIUM 40 MG PO TABS: 40.0000 mg | ORAL_TABLET | Freq: Every day | ORAL | 3 refills | Status: DC

## 2017-12-02 MED ORDER — LOSARTAN POTASSIUM-HCTZ 100-12.5 MG PO TABS: 1.0000 | ORAL_TABLET | Freq: Every day | ORAL | 3 refills | Status: DC

## 2017-12-02 MED ORDER — METOPROLOL SUCCINATE ER 25 MG PO TB24: 12.5000 mg | ORAL_TABLET | Freq: Every day | ORAL | 3 refills | Status: DC

## 2017-12-02 MED ORDER — TRAZODONE HCL 100 MG PO TABS: ORAL_TABLET | ORAL | 1 refills | Status: DC

## 2017-12-02 MED ORDER — METFORMIN HCL ER 500 MG PO TB24: 1000.0000 mg | ORAL_TABLET | Freq: Two times a day (BID) | ORAL | 3 refills | Status: DC

## 2017-12-02 MED ORDER — PIOGLITAZONE HCL 15 MG PO TABS: 15.0000 mg | ORAL_TABLET | Freq: Every day | ORAL | 3 refills | Status: DC

## 2017-12-02 MED ORDER — FLUTICASONE-SALMETEROL 100-50 MCG/DOSE IN AEPB: 1.0000 | INHALATION_SPRAY | Freq: Two times a day (BID) | RESPIRATORY_TRACT | 3 refills | Status: DC

## 2017-12-02 MED ORDER — CITALOPRAM HYDROBROMIDE 20 MG PO TABS: ORAL_TABLET | ORAL | 3 refills | Status: DC

## 2017-12-02 NOTE — Assessment & Plan Note (Signed)

## 2017-12-02 NOTE — Patient Instructions (Signed)

## 2017-12-02 NOTE — Assessment & Plan Note (Signed)
stable overall by history and exam, recent data reviewed with pt, and pt to continue medical treatment as before,  to f/u any worsening symptoms or concerns  

## 2017-12-02 NOTE — Progress Notes (Signed)
Subjective:    Patient ID: Savannah [name], female    DOB: Jun 11, 1955, 62 y.o.   MRN: [id number]  HPI Here for wellness and f/u;  Overall doing ok;  Pt denies Chest pain, worsening SOB, DOE, wheezing, orthopnea, PND, worsening LE edema, palpitations, dizziness or syncope.  Pt denies neurological change such as new headache, facial or extremity weakness.  Pt denies polydipsia, polyuria, or low sugar symptoms. Pt states overall good compliance with treatment and medications, good tolerability, and has been trying to follow appropriate diet.  Pt denies worsening depressive symptoms, suicidal ideation or panic. No fever, night sweats, wt loss, loss of appetite, or other constitutional symptoms.  Pt states good ability with ADL's, has low fall risk, home safety reviewed and adequate, no other significant changes in hearing or vision, and only occasionally active with exercise. Did have recent URI but did not have wheezing like she usually does. Wt Readings from Last 3 Encounters:  12/02/17 241 lb (109.3 kg)  10/15/17 235 lb 12.8 oz (107 kg)  06/21/17 249 lb 8 oz (113.2 kg)   BP Readings from Last 3 Encounters:  12/02/17 124/80  10/15/17 128/78  06/21/17 (!) 144/58  Due for next b12 on oct 29. Past Medical History:  Diagnosis Date  . Allergic rhinitis   . Asthma   . B12 deficiency   . Depression   . Grade I internal hemorrhoids   . Hiatal hernia   . History of colon polyps   . History of kidney stones   . 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 Hypercholesterolemia   . Hypertension   . Hypothyroidism    endocrinologist -  dr Hale Bogus  . Iron deficiency anemia   . Lumbar spondylosis   . NAFLD (nonalcoholic fatty liver disease)    dx 2010--- last hepatic panel in epic dated 04-17-2017  . Nocturia   . OA (osteoarthritis of spine)    Lumbar  . OSA on CPAP pulmologist-  dr sood   sleep study 09-11-2004  very severe osa , AHI 119/hr,  setting 10  . Peripheral neuropathy    bottom of feet  . Renal calculus, right   . Type 2 diabetes mellitus ([place])    endocrinoloigst-  dr Loanne Drilling,  last A1c 5.9 on 02-20-219 in epic  . Vitamin B 12 deficiency   . Wears glasses    Past Surgical History:  Procedure Laterality Date  . BUNIONECTOMY/  HAMMERTOE CORRECTION'S  09-07-2013   SCG   RIGHT FOOT 2ND, 3RD, 4TH, 5TH DIGITS  . CARDIAC CATHETERIZATION  06-01-2009  dr Angelena Form   no evidence CAD,  normal LVSF, elevated blood pressure w/ elevated end-diastolic pressure, ef 58-85%  . CARDIOVASCULAR STRESS TEST  05-11-2009   dr Angelena Form   abnormal lexiscan nuclear study (no exercise) w/ mild ischemia in the distal anteroseptal wall and apex, this defect may be due to shifting breast attenuation  . COLONOSCOPY WITH SNARE POLYPECTOMY WITH ESOPHAGOGASTRODUODENOSCOPY  02/08/2014   with biopsy Dr. Jerilynn Mages. Fuller Plan  . CYSTOSCOPY WITH RETROGRADE PYELOGRAM, URETEROSCOPY AND STENT PLACEMENT Right 06/07/2017   Procedure: CYSTOSCOPY WITH RETROGRADE PYELOGRAM, URETEROSCOPY, LITHOTRIPSY,  AND STENT PLACEMENT 1st STAGE;  Surgeon: Alexis Frock, MD;  Location: [facility];  Service: Urology;  Laterality: Right;  . CYSTOSCOPY WITH RETROGRADE PYELOGRAM, URETEROSCOPY AND STENT PLACEMENT Right 06/21/2017  Procedure: CYSTOSCOPY WITH RETROGRADE PYELOGRAM, URETEROSCOPY AND STENT PLACEMENT 2ND STAGE  STENT" EXCHANGE";  Surgeon: Alexis Frock, MD;  Location: [facility];  Service: Urology;  Laterality: Right;  . FRACTURE SURGERY Right    Compound fracture foot  . HOLMIUM LASER APPLICATION Right 05/17/252   Procedure: HOLMIUM LASER APPLICATION;  Surgeon: Alexis Frock, MD;  Location: [facility];  Service: Urology;  Laterality: Right;  . HOLMIUM LASER APPLICATION Right 2/70/6237   Procedure: HOLMIUM LASER APPLICATION;   Surgeon: Alexis Frock, MD;  Location: [facility];  Service: Urology;  Laterality: Right;  . KNEE ARTHROSCOPY Left 1993  . ROBOTIC ASSITED PARTIAL NEPHRECTOMY Right 05/19/2014   Procedure: ROBOTIC ASSITED PARTIAL NEPHRECTOMY;  Surgeon: Alexis Frock, MD;  Location: [facility];  Service: Urology;  Laterality: Right;    reports that she quit smoking about 42 years ago. Her smoking use included cigarettes. She has a 0.60 pack-year smoking history. She has never used smokeless tobacco. She reports that she does not drink alcohol or use drugs. family history includes Allergies in her sister; Asthma in her sister; Clotting disorder in her grandchild; Colon cancer (age of onset: 90) in her mother; Diabetes in her mother; Heart attack in her father; Heart disease in her father and paternal grandmother; Kidney disease in her mother. Allergies  Allergen Reactions  . Erythromycin Nausea And Vomiting   Current Outpatient Medications on File Prior to Visit  Medication Sig Dispense Refill  . cholecalciferol (VITAMIN D) 1000 units tablet Take 1,000 Units by mouth 2 (two) times daily.    . Cyanocobalamin (B-12 COMPLIANCE INJECTION IJ) Inject as directed every 30 (thirty) days.    . ferrous sulfate 325 (65 FE) MG tablet Take 325 mg by mouth daily with breakfast.    . glucose blood (ONETOUCH VERIO) test strip Use to check blood sugar 1 time per day. 100 each 3  . hyoscyamine (LEVSIN SL) 0.125 MG SL tablet Take 1-2 tablets by mouth or under tongue every 4 hours as needed. (Patient taking differently: Take 0.125-0.25 mg by mouth every 4 (four) hours as needed for cramping (stomach spasms). ) 100 tablet 5  . ONETOUCH DELICA LANCETS FINE MISC Use to check blood sugar 1 time per day. 100 each 2   No current facility-administered medications on file prior to visit.     Review of Systems Constitutional: Negative for other unusual diaphoresis, sweats, appetite or weight changes HENT: Negative for other  worsening hearing loss, ear pain, facial swelling, mouth sores or neck stiffness.   Eyes: Negative for other worsening pain, redness or other visual disturbance.  Respiratory: Negative for other stridor or swelling Cardiovascular: Negative for other palpitations or other chest pain  Gastrointestinal: Negative for worsening diarrhea or loose stools, blood in stool, distention or other pain Genitourinary: Negative for hematuria, flank pain or other change in urine volume.  Musculoskeletal: Negative for myalgias or other joint swelling.  Skin: Negative for other color change, or other wound or worsening drainage.  Neurological: Negative for other syncope or numbness. Hematological: Negative for other adenopathy or swelling Psychiatric/Behavioral: Negative for hallucinations, other worsening agitation, SI, self-injury, or new decreased concentration All other system neg per pt    Objective:   Physical Exam BP 124/80   Pulse 73   Temp 98.1 F (36.7 C) (Oral)   Ht 5' 8.5" (1.74 m)   Wt 241 lb (109.3 kg)   SpO2 95%   BMI 36.11 kg/m  VS noted,  Constitutional: Pt  is oriented to person, place, and time. Appears well-developed and well-nourished, in no significant distress and comfortable Head: Normocephalic and atraumatic  Eyes: Conjunctivae and EOM are normal. Pupils are equal, round, and reactive to light Right Ear: External ear normal without discharge Left Ear: External ear normal without discharge Nose: Nose without discharge or deformity Mouth/Throat: Oropharynx is without other ulcerations and moist  Neck: Normal range of motion. Neck supple. No JVD present. No tracheal deviation present or significant neck LA or mass Cardiovascular: Normal rate, regular rhythm, normal heart sounds and intact distal pulses. Pulmonary/Chest: WOB normal and breath sounds without rales or wheezing  Abdominal: Soft. Bowel sounds are normal. NT. No HSM  Musculoskeletal: Normal range of motion. Exhibits  no edema Lymphadenopathy: Has no other cervical adenopathy.  Neurological: Pt is alert and oriented to person, place, and time. Pt has normal reflexes. No cranial nerve deficit. Motor grossly intact, Gait intact Skin: Skin is warm and dry. No rash noted or new ulcerations Psychiatric:  Has normal mood and affect. Behavior is normal without agitation No other exam findings Lab Results  Component Value Date   WBC 5.3 04/17/2017   HGB 10.9 (L) 06/21/2017   HCT 32.0 (L) 06/21/2017   PLT 181.0 04/17/2017   GLUCOSE 132 (H) 06/21/2017   CHOL 146 04/17/2017   TRIG 165.0 (H) 04/17/2017   HDL 52.20 04/17/2017   LDLDIRECT 88.0 01/14/2015   LDLCALC 61 04/17/2017   ALT 14 04/17/2017   AST 14 04/17/2017   NA 142 06/21/2017   K 4.0 06/21/2017   CL 104 06/21/2017   CREATININE 1.20 (H) 06/21/2017   BUN 23 (H) 06/21/2017   CO2 25 10/15/2016   TSH 0.70 10/15/2017   INR 1.1 ratio (H) 05/23/2009   HGBA1C 5.6 10/15/2017   MICROALBUR 4.0 (H) 04/17/2017       Assessment & Plan:

## 2017-12-24 ENCOUNTER — Ambulatory Visit (INDEPENDENT_AMBULATORY_CARE_PROVIDER_SITE_OTHER): Payer: BLUE CROSS/BLUE SHIELD

## 2017-12-24 DIAGNOSIS — E538 Deficiency of other specified B group vitamins: Secondary | ICD-10-CM | POA: Diagnosis not present

## 2017-12-24 MED ORDER — CYANOCOBALAMIN 1000 MCG/ML IJ SOLN
1000.0000 ug | Freq: Once | INTRAMUSCULAR | Status: AC
  Administered 2017-12-24: 1000 ug via INTRAMUSCULAR

## 2017-12-24 NOTE — Progress Notes (Signed)
Per orders of Dr. Loanne Drilling injection of B12 1000 mcg/mL given today by Lolita Rieger RMA. Patient tolerated injection well.

## 2018-01-27 ENCOUNTER — Ambulatory Visit (INDEPENDENT_AMBULATORY_CARE_PROVIDER_SITE_OTHER): Payer: BLUE CROSS/BLUE SHIELD | Admitting: *Deleted

## 2018-01-27 DIAGNOSIS — E538 Deficiency of other specified B group vitamins: Secondary | ICD-10-CM

## 2018-01-27 MED ORDER — CYANOCOBALAMIN 1000 MCG/ML IJ SOLN
1000.0000 ug | Freq: Once | INTRAMUSCULAR | Status: AC
  Administered 2018-01-27: 1000 ug via INTRAMUSCULAR

## 2018-02-27 ENCOUNTER — Ambulatory Visit (INDEPENDENT_AMBULATORY_CARE_PROVIDER_SITE_OTHER): Payer: BLUE CROSS/BLUE SHIELD | Admitting: *Deleted

## 2018-02-27 DIAGNOSIS — E538 Deficiency of other specified B group vitamins: Secondary | ICD-10-CM

## 2018-02-27 MED ORDER — CYANOCOBALAMIN 1000 MCG/ML IJ SOLN
1000.0000 ug | Freq: Once | INTRAMUSCULAR | Status: AC
  Administered 2018-02-27: 1000 ug via INTRAMUSCULAR

## 2018-02-27 NOTE — Progress Notes (Signed)
Medical screening examination/treatment/procedure(s) were performed by non-physician practitioner and as supervising physician I was immediately available for consultation/collaboration. I agree with above. Mylasia Vorhees, MD   

## 2018-03-06 ENCOUNTER — Encounter: Payer: Self-pay | Admitting: Nurse Practitioner

## 2018-03-06 ENCOUNTER — Inpatient Hospital Stay (HOSPITAL_COMMUNITY)
Admission: EM | Admit: 2018-03-06 | Discharge: 2018-03-11 | DRG: 871 | Disposition: A | Discharge status: Home / Self Care | Payer: BLUE CROSS/BLUE SHIELD | Attending: Internal Medicine | Admitting: Internal Medicine

## 2018-03-06 ENCOUNTER — Other Ambulatory Visit (INDEPENDENT_AMBULATORY_CARE_PROVIDER_SITE_OTHER): Payer: BLUE CROSS/BLUE SHIELD

## 2018-03-06 ENCOUNTER — Ambulatory Visit (INDEPENDENT_AMBULATORY_CARE_PROVIDER_SITE_OTHER): Payer: BLUE CROSS/BLUE SHIELD | Admitting: Nurse Practitioner

## 2018-03-06 ENCOUNTER — Ambulatory Visit (INDEPENDENT_AMBULATORY_CARE_PROVIDER_SITE_OTHER)
Admission: RE | Admit: 2018-03-06 | Discharge: 2018-03-06 | Disposition: A | Discharge status: Home / Self Care | Payer: BLUE CROSS/BLUE SHIELD | Source: Ambulatory Visit | Attending: Nurse Practitioner | Admitting: Nurse Practitioner

## 2018-03-06 ENCOUNTER — Telehealth: Payer: Self-pay | Admitting: Internal Medicine

## 2018-03-06 ENCOUNTER — Encounter (HOSPITAL_COMMUNITY): Payer: Self-pay | Admitting: Emergency Medicine

## 2018-03-06 ENCOUNTER — Emergency Department (HOSPITAL_COMMUNITY): Payer: BLUE CROSS/BLUE SHIELD

## 2018-03-06 ENCOUNTER — Other Ambulatory Visit: Payer: Self-pay

## 2018-03-06 ENCOUNTER — Ambulatory Visit: Payer: Self-pay | Admitting: *Deleted

## 2018-03-06 VITALS — BP 130/80 | HR 66 | Temp 103.1°F | Ht 68.5 in | Wt 234.0 lb

## 2018-03-06 DIAGNOSIS — A419 Sepsis, unspecified organism: Secondary | ICD-10-CM | POA: Diagnosis not present

## 2018-03-06 DIAGNOSIS — E119 Type 2 diabetes mellitus without complications: Secondary | ICD-10-CM

## 2018-03-06 DIAGNOSIS — K760 Fatty (change of) liver, not elsewhere classified: Secondary | ICD-10-CM | POA: Diagnosis present

## 2018-03-06 DIAGNOSIS — H6690 Otitis media, unspecified, unspecified ear: Secondary | ICD-10-CM

## 2018-03-06 DIAGNOSIS — Z85528 Personal history of other malignant neoplasm of kidney: Secondary | ICD-10-CM

## 2018-03-06 DIAGNOSIS — H9203 Otalgia, bilateral: Secondary | ICD-10-CM | POA: Diagnosis present

## 2018-03-06 DIAGNOSIS — Z841 Family history of disorders of kidney and ureter: Secondary | ICD-10-CM

## 2018-03-06 DIAGNOSIS — M47816 Spondylosis without myelopathy or radiculopathy, lumbar region: Secondary | ICD-10-CM | POA: Diagnosis present

## 2018-03-06 DIAGNOSIS — I129 Hypertensive chronic kidney disease with stage 1 through stage 4 chronic kidney disease, or unspecified chronic kidney disease: Secondary | ICD-10-CM | POA: Diagnosis present

## 2018-03-06 DIAGNOSIS — R509 Fever, unspecified: Secondary | ICD-10-CM

## 2018-03-06 DIAGNOSIS — R410 Disorientation, unspecified: Secondary | ICD-10-CM

## 2018-03-06 DIAGNOSIS — J204 Acute bronchitis due to parainfluenza virus: Secondary | ICD-10-CM | POA: Diagnosis present

## 2018-03-06 DIAGNOSIS — R4182 Altered mental status, unspecified: Secondary | ICD-10-CM | POA: Diagnosis not present

## 2018-03-06 DIAGNOSIS — D509 Iron deficiency anemia, unspecified: Secondary | ICD-10-CM | POA: Diagnosis present

## 2018-03-06 DIAGNOSIS — I10 Essential (primary) hypertension: Secondary | ICD-10-CM | POA: Diagnosis present

## 2018-03-06 DIAGNOSIS — E1142 Type 2 diabetes mellitus with diabetic polyneuropathy: Secondary | ICD-10-CM | POA: Diagnosis present

## 2018-03-06 DIAGNOSIS — E538 Deficiency of other specified B group vitamins: Secondary | ICD-10-CM | POA: Diagnosis present

## 2018-03-06 DIAGNOSIS — G4733 Obstructive sleep apnea (adult) (pediatric): Secondary | ICD-10-CM | POA: Diagnosis present

## 2018-03-06 DIAGNOSIS — J069 Acute upper respiratory infection, unspecified: Secondary | ICD-10-CM

## 2018-03-06 DIAGNOSIS — G9341 Metabolic encephalopathy: Secondary | ICD-10-CM | POA: Diagnosis present

## 2018-03-06 DIAGNOSIS — E1122 Type 2 diabetes mellitus with diabetic chronic kidney disease: Secondary | ICD-10-CM | POA: Diagnosis present

## 2018-03-06 DIAGNOSIS — Z905 Acquired absence of kidney: Secondary | ICD-10-CM

## 2018-03-06 DIAGNOSIS — J45909 Unspecified asthma, uncomplicated: Secondary | ICD-10-CM | POA: Diagnosis present

## 2018-03-06 DIAGNOSIS — E039 Hypothyroidism, unspecified: Secondary | ICD-10-CM | POA: Diagnosis present

## 2018-03-06 DIAGNOSIS — D631 Anemia in chronic kidney disease: Secondary | ICD-10-CM | POA: Diagnosis present

## 2018-03-06 DIAGNOSIS — Z86010 Personal history of colonic polyps: Secondary | ICD-10-CM

## 2018-03-06 DIAGNOSIS — Z825 Family history of asthma and other chronic lower respiratory diseases: Secondary | ICD-10-CM

## 2018-03-06 DIAGNOSIS — Z79890 Hormone replacement therapy (postmenopausal): Secondary | ICD-10-CM

## 2018-03-06 DIAGNOSIS — Z87442 Personal history of urinary calculi: Secondary | ICD-10-CM

## 2018-03-06 DIAGNOSIS — Z7984 Long term (current) use of oral hypoglycemic drugs: Secondary | ICD-10-CM

## 2018-03-06 DIAGNOSIS — Z881 Allergy status to other antibiotic agents status: Secondary | ICD-10-CM

## 2018-03-06 DIAGNOSIS — Z833 Family history of diabetes mellitus: Secondary | ICD-10-CM

## 2018-03-06 DIAGNOSIS — N182 Chronic kidney disease, stage 2 (mild): Secondary | ICD-10-CM | POA: Diagnosis present

## 2018-03-06 DIAGNOSIS — E7800 Pure hypercholesterolemia, unspecified: Secondary | ICD-10-CM | POA: Diagnosis present

## 2018-03-06 DIAGNOSIS — Z7951 Long term (current) use of inhaled steroids: Secondary | ICD-10-CM

## 2018-03-06 DIAGNOSIS — Z87891 Personal history of nicotine dependence: Secondary | ICD-10-CM

## 2018-03-06 DIAGNOSIS — Z8249 Family history of ischemic heart disease and other diseases of the circulatory system: Secondary | ICD-10-CM

## 2018-03-06 DIAGNOSIS — E876 Hypokalemia: Secondary | ICD-10-CM | POA: Diagnosis present

## 2018-03-06 DIAGNOSIS — Z79899 Other long term (current) drug therapy: Secondary | ICD-10-CM

## 2018-03-06 LAB — COMPREHENSIVE METABOLIC PANEL
ALK PHOS: 44 U/L (ref 38–126)
ALT: 12 U/L (ref 0–44)
AST: 15 U/L (ref 15–41)
Albumin: 3.6 g/dL (ref 3.5–5.0)
Anion gap: 14 (ref 5–15)
BUN: 25 mg/dL — ABNORMAL HIGH (ref 8–23)
CALCIUM: 8.1 mg/dL — AB (ref 8.9–10.3)
CO2: 23 mmol/L (ref 22–32)
Chloride: 100 mmol/L (ref 98–111)
Creatinine, Ser: 1.5 mg/dL — ABNORMAL HIGH (ref 0.44–1.00)
GFR calc Af Amer: 43 mL/min — ABNORMAL LOW (ref >60)
GFR calc non Af Amer: 37 mL/min — ABNORMAL LOW (ref >60)
Glucose, Bld: 200 mg/dL — ABNORMAL HIGH (ref 70–99)
Potassium: 2.3 mmol/L — CL (ref 3.5–5.1)
Sodium: 137 mmol/L (ref 135–145)
Total Bilirubin: 2.4 mg/dL — ABNORMAL HIGH (ref 0.3–1.2)
Total Protein: 7 g/dL (ref 6.5–8.1)

## 2018-03-06 LAB — BASIC METABOLIC PANEL
BUN: 19 mg/dL (ref 6–23)
CO2: 27 mEq/L (ref 19–32)
Calcium: 9 mg/dL (ref 8.4–10.5)
Chloride: 96 mEq/L (ref 96–112)
Creatinine, Ser: 1.24 mg/dL — ABNORMAL HIGH (ref 0.40–1.20)
GFR: 46.35 mL/min — ABNORMAL LOW (ref >60.00)
Glucose, Bld: 146 mg/dL — ABNORMAL HIGH (ref 70–99)
Potassium: 3.2 mEq/L — ABNORMAL LOW (ref 3.5–5.1)
SODIUM: 135 meq/L (ref 135–145)

## 2018-03-06 LAB — CBC WITH DIFFERENTIAL/PLATELET
Abs Immature Granulocytes: 0.05 10*3/uL (ref 0.00–0.07)
Basophils Absolute: 0 10*3/uL (ref 0.0–0.1)
Basophils Relative: 0 %
Eosinophils Absolute: 0.2 10*3/uL (ref 0.0–0.5)
Eosinophils Relative: 2 %
HCT: 32.3 % — ABNORMAL LOW (ref 36.0–46.0)
HEMOGLOBIN: 11 g/dL — AB (ref 12.0–15.0)
Immature Granulocytes: 1 %
Lymphocytes Relative: 4 %
Lymphs Abs: 0.4 10*3/uL — ABNORMAL LOW (ref 0.7–4.0)
MCH: 30.2 pg (ref 26.0–34.0)
MCHC: 34.1 g/dL (ref 30.0–36.0)
MCV: 88.7 fL (ref 80.0–100.0)
Monocytes Absolute: 0.8 10*3/uL (ref 0.1–1.0)
Monocytes Relative: 8 %
NEUTROS PCT: 85 %
Neutro Abs: 8.4 10*3/uL — ABNORMAL HIGH (ref 1.7–7.7)
Platelets: 152 10*3/uL (ref 150–400)
RBC: 3.64 MIL/uL — ABNORMAL LOW (ref 3.87–5.11)
RDW: 13.5 % (ref 11.5–15.5)
WBC: 9.9 10*3/uL (ref 4.0–10.5)
nRBC: 0 % (ref 0.0–0.2)

## 2018-03-06 LAB — URINALYSIS, ROUTINE W REFLEX MICROSCOPIC
Bacteria, UA: NONE SEEN
Bilirubin Urine: NEGATIVE
Glucose, UA: NEGATIVE mg/dL
Ketones, ur: 20 mg/dL — AB
Leukocytes, UA: NEGATIVE
Nitrite: NEGATIVE
Protein, ur: 100 mg/dL — AB
Specific Gravity, Urine: 1.02 (ref 1.005–1.030)
pH: 5 (ref 5.0–8.0)

## 2018-03-06 LAB — INFLUENZA PANEL BY PCR (TYPE A & B)
Influenza A By PCR: NEGATIVE
Influenza B By PCR: NEGATIVE

## 2018-03-06 LAB — CBC
HCT: 35.9 % — ABNORMAL LOW (ref 36.0–46.0)
HEMOGLOBIN: 12.6 g/dL (ref 12.0–15.0)
MCHC: 35.1 g/dL (ref 30.0–36.0)
MCV: 87.2 fl (ref 78.0–100.0)
Platelets: 185 10*3/uL (ref 150.0–400.0)
RBC: 4.12 Mil/uL (ref 3.87–5.11)
RDW: 13.7 % (ref 11.5–15.5)
WBC: 9 10*3/uL (ref 4.0–10.5)

## 2018-03-06 LAB — LIPASE, BLOOD: Lipase: 29 U/L (ref 11–51)

## 2018-03-06 LAB — I-STAT CG4 LACTIC ACID, ED: Lactic Acid, Venous: 1.71 mmol/L (ref 0.5–1.9)

## 2018-03-06 MED ORDER — LORAZEPAM 2 MG/ML IJ SOLN
0.5000 mg | Freq: Once | INTRAMUSCULAR | Status: AC
  Administered 2018-03-06: 0.5 mg via INTRAVENOUS
  Filled 2018-03-06: qty 1

## 2018-03-06 MED ORDER — LORAZEPAM 2 MG/ML IJ SOLN: 1.0000 mg | Freq: Once | INTRAMUSCULAR | Status: DC

## 2018-03-06 MED ORDER — SODIUM CHLORIDE 0.9 % IV SOLN
2.0000 g | Freq: Once | INTRAVENOUS | Status: AC
  Administered 2018-03-06: 2 g via INTRAVENOUS
  Filled 2018-03-06: qty 2

## 2018-03-06 MED ORDER — IOPAMIDOL (ISOVUE-300) INJECTION 61%
100.0000 mL | Freq: Once | INTRAVENOUS | Status: AC | PRN
  Administered 2018-03-06: 100 mL via INTRAVENOUS

## 2018-03-06 MED ORDER — AMOXICILLIN-POT CLAVULANATE 875-125 MG PO TABS: 1.0000 | ORAL_TABLET | Freq: Two times a day (BID) | ORAL | 0 refills | Status: DC

## 2018-03-06 MED ORDER — METRONIDAZOLE IN NACL 5-0.79 MG/ML-% IV SOLN
500.0000 mg | Freq: Three times a day (TID) | INTRAVENOUS | Status: DC
  Administered 2018-03-06: 500 mg via INTRAVENOUS
  Filled 2018-03-06: qty 100

## 2018-03-06 MED ORDER — POTASSIUM CHLORIDE 10 MEQ/100ML IV SOLN: 10.0000 meq | INTRAVENOUS | Status: DC

## 2018-03-06 MED ORDER — SODIUM CHLORIDE (PF) 0.9 % IJ SOLN
INTRAMUSCULAR | Status: AC
  Administered 2018-03-07: 06:00:00
  Filled 2018-03-06: qty 50

## 2018-03-06 MED ORDER — ONDANSETRON 4 MG PO TBDP: 4.0000 mg | ORAL_TABLET | Freq: Three times a day (TID) | ORAL | 0 refills | Status: DC | PRN

## 2018-03-06 MED ORDER — SODIUM CHLORIDE 0.9 % IV BOLUS
1000.0000 mL | Freq: Once | INTRAVENOUS | Status: AC
  Administered 2018-03-06: 1000 mL via INTRAVENOUS

## 2018-03-06 MED ORDER — ACETAMINOPHEN 650 MG RE SUPP
650.0000 mg | Freq: Once | RECTAL | Status: AC
  Administered 2018-03-06: 650 mg via RECTAL
  Filled 2018-03-06: qty 1

## 2018-03-06 MED ORDER — VANCOMYCIN HCL 10 G IV SOLR
2000.0000 mg | Freq: Once | INTRAVENOUS | Status: AC
  Administered 2018-03-06: 2000 mg via INTRAVENOUS
  Filled 2018-03-06: qty 2000

## 2018-03-06 MED ORDER — IOPAMIDOL (ISOVUE-300) INJECTION 61%
INTRAVENOUS | Status: AC
  Filled 2018-03-06: qty 100

## 2018-03-06 MED ORDER — VANCOMYCIN HCL IN DEXTROSE 1-5 GM/200ML-% IV SOLN: 1000.0000 mg | Freq: Once | INTRAVENOUS | Status: DC

## 2018-03-06 MED ORDER — ACETAMINOPHEN 500 MG PO TABS: 1000.0000 mg | ORAL_TABLET | Freq: Once | ORAL | Status: AC

## 2018-03-06 NOTE — ED Notes (Signed)
Pt placed on 2L O2 per [place] PA

## 2018-03-06 NOTE — ED Provider Notes (Signed)
[place] DEPT Provider Note   CSN: [id number] Arrival date & time: 03/06/18  1932     History   Chief Complaint No chief complaint on file.   HPI Savannah [name] is a 63 y.o. female.  The history is provided by the patient and medical records. No language interpreter was used.   Savannah [name] is a 63 y.o. female  with a PMH as listed below who presents to the Emergency Department with daughter for fever and altered mental status.  All history obtained through the daughter at bedside has patient is unable to contribute to history given mental status change.  Per daughter, patient has had a cough recently.  She had a fever this morning, therefore she went to her primary care doctor.  She was told them that she had some type of upper respiratory infection.  They recommended that she come to the ER, but patient refused.  Since being home, she has become altered.  Daughter states that she normally is able to carry on a conversation and very sassy.  This is not like her typical mental status whatsoever per daughter.  Daughter also states that she has been grabbing her stomach as if it is hurting her.  She has not noticed any vomiting or diarrhea.   Level V caveat applies 2/2 mental status change.     Past Medical History:  Diagnosis Date  . Allergic rhinitis   . Asthma   . B12 deficiency   . Depression   . Grade I internal hemorrhoids   . Hiatal hernia   . History of colon polyps   . History of kidney stones   . 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 Hypercholesterolemia   . Hypertension   . Hypothyroidism    endocrinologist -  dr Hale Bogus  . Iron deficiency anemia   . Lumbar spondylosis   . NAFLD (nonalcoholic fatty liver disease)    dx 2010--- last hepatic panel in epic dated 04-17-2017  .  Nocturia   . OA (osteoarthritis of spine)    Lumbar  . OSA on CPAP pulmologist-  dr sood   sleep study 09-11-2004  very severe osa , AHI 119/hr,  setting 10  . Peripheral neuropathy    bottom of feet  . Renal calculus, right   . Type 2 diabetes mellitus ([place])    endocrinoloigst-  dr Loanne Drilling,  last A1c 5.9 on 02-20-219 in epic  . Vitamin B 12 deficiency   . Wears glasses     Patient Active Problem List   Diagnosis Date Noted  . B12 deficiency 09/19/2017  . Numbness 04/17/2017  . Achilles tendinitis, left leg 04/26/2016  . Ankle pain, left 04/17/2016  . UTI (urinary tract infection) 04/17/2016  . Contusion of knee 01/06/2016  . Foot contusion 01/06/2016  . Gastric peptic ulcer 04/15/2015  . Cardiomegaly 01/14/2015  . Postoperative anemia due to acute blood loss 06/25/2014  . Renal mass 05/19/2014  . Dyspnea 10/28/2013  . Renal insufficiency 08/07/2013  . Hypokalemia 03/11/2013  . Sinusitis, acute 02/04/2013  . Right wrist pain 08/26/2012  . Neoplasm of uncertain behavior of skin 08/26/2012  . Routine general medical examination at a health care facility 08/02/2012  . Nausea with vomiting 08/01/2012  . Abdominal pain, epigastric 01/15/2012  . Vertigo 07/24/2011  . Vitamin D  deficiency 12/09/2010  . Hyperparathyroidism ([place]) 12/06/2010  . Dysuria 10/23/2010  . Cough 10/23/2010  . Muscle weakness 07/14/2010  . ARTHRPATH W/OTH ENDOCRINE&METABOLIC DISORDERS 95/63/8756  . BACK PAIN, LUMBAR 12/29/2009  . URINARY CALCULUS 11/08/2009  . Obstructive sleep apnea 08/09/2009  . HEMATURIA UNSPECIFIED 06/28/2009  . OTITIS MEDIA, LEFT 06/14/2009  . Palpitations 05/23/2009  . HYPERCHOLESTEROLEMIA 04/18/2009  . 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 CHEST PAIN 04/18/2009  . Iron deficiency anemia 12/06/2008  . DIZZINESS 12/06/2008  . NASH (nonalcoholic steatohepatitis) 03/19/2008  . ANKLE SPRAIN, RIGHT 03/19/2008  . Asymptomatic postmenopausal status 03/19/2008  . ALLERGIC  RHINITIS CAUSE UNSPECIFIED 12/29/2007  . ASTHMATIC BRONCHITIS, ACUTE 08/16/2007  . NECK PAIN 06/18/2007  . Diabetes ([place]) 04/18/2007  . HYPOKALEMIA 01/15/2007  . Hypothyroidism 01/07/2007  . Essential hypertension 09/27/2006  . ASTHMA 09/27/2006    Past Surgical History:  Procedure Laterality Date  . BUNIONECTOMY/  HAMMERTOE CORRECTION'S  09-07-2013   SCG   RIGHT FOOT 2ND, 3RD, 4TH, 5TH DIGITS  . CARDIAC CATHETERIZATION  06-01-2009  dr Angelena Form   no evidence CAD,  normal LVSF, elevated blood pressure w/ elevated end-diastolic pressure, ef 43-32%  . CARDIOVASCULAR STRESS TEST  05-11-2009   dr Angelena Form   abnormal lexiscan nuclear study (no exercise) w/ mild ischemia in the distal anteroseptal wall and apex, this defect may be due to shifting breast attenuation  . COLONOSCOPY WITH SNARE POLYPECTOMY WITH ESOPHAGOGASTRODUODENOSCOPY  02/08/2014   with biopsy Dr. Jerilynn Mages. [facility]  . CYSTOSCOPY WITH RETROGRADE PYELOGRAM, URETEROSCOPY AND STENT PLACEMENT Right 06/07/2017   Procedure: CYSTOSCOPY WITH RETROGRADE PYELOGRAM, URETEROSCOPY, LITHOTRIPSY,  AND STENT PLACEMENT 1st STAGE;  Surgeon: Alexis Frock, MD;  Location[facility];  Service: Urology;  Laterality: Right;  . CYSTOSCOPY WITH RETROGRADE PYELOGRAM, URETEROSCOPY AND STENT PLACEMENT Right 06/21/2017   Procedure: CYSTOSCOPY WITH RETROGRADE PYELOGRAM, URETEROSCOPY AND STENT PLACEMENT 2ND STAGE  STENT" EXCHANGE";  Surgeon: Alexis Frock, MD;  Location: [facility];  Service: Urology;  Laterality: Right;  . FRACTURE SURGERY Right    Compound fracture foot  . HOLMIUM LASER APPLICATION Right 9/51/8841   Procedure: HOLMIUM LASER APPLICATION;  Surgeon: Alexis Frock, MD;  Location: [facility];  Service: Urology;  Laterality: Right;  . HOLMIUM LASER APPLICATION Right 6/60/6301   Procedure: HOLMIUM LASER APPLICATION;  Surgeon: Alexis Frock, MD;  Location: [facility];  Service: Urology;   Laterality: Right;  . KNEE ARTHROSCOPY Left 1993  . ROBOTIC ASSITED PARTIAL NEPHRECTOMY Right 05/19/2014   Procedure: ROBOTIC ASSITED PARTIAL NEPHRECTOMY;  Surgeon: Alexis Frock, MD;  Location: [facility];  Service: Urology;  Laterality: Right;     OB History   No obstetric history on file.      Home Medications    Prior to Admission medications   Medication Sig Start Date End Date Taking? Authorizing Provider  amoxicillin-clavulanate (AUGMENTIN) 875-125 MG tablet Take 1 tablet by mouth 2 (two) times daily. 03/06/18  Yes Lance Sell, NP  atorvastatin (LIPITOR) 40 MG tablet Take 1 tablet (40 mg total) by mouth daily. 12/02/17  Yes Biagio Borg, MD  cholecalciferol (VITAMIN D) 1000 units tablet Take 1,000 Units by mouth 2 (two) times daily.   Yes [provider]  citalopram (CELEXA) 20 MG tablet TAKE 1 TABLET BY MOUTH EVERY DAY--- takes in am 12/02/17  Yes Biagio Borg, MD  Cyanocobalamin (B-12 COMPLIANCE INJECTION IJ) Inject as directed every 30 (thirty) days.   Yes [provider]  ferrous sulfate  325 (65 FE) MG tablet Take 325 mg by mouth daily with breakfast.   Yes [provider]  Fluticasone-Salmeterol (ADVAIR) 100-50 MCG/DOSE AEPB Inhale 1 puff into the lungs 2 (two) times daily. 12/02/17  Yes Biagio Borg, MD  gabapentin (NEURONTIN) 300 MG capsule TAKE 1 CAPSULE BY MOUTH EVERYDAY AT BEDTIME 12/02/17  Yes Biagio Borg, MD  levothyroxine (SYNTHROID, LEVOTHROID) 137 MCG tablet Take 1 tablet (137 mcg total) by mouth daily before breakfast. 12/02/17  Yes Biagio Borg, MD  losartan-hydrochlorothiazide (HYZAAR) 100-12.5 MG tablet Take 1 tablet by mouth daily. 12/02/17  Yes Biagio Borg, MD  metFORMIN (GLUCOPHAGE-XR) 500 MG 24 hr tablet Take 2 tablets (1,000 mg total) by mouth 2 (two) times daily. TAKE 2 TABLETS BY MOUTH TWICE A DAY 12/02/17  Yes Biagio Borg, MD  metoprolol succinate (TOPROL-XL) 25 MG 24 hr tablet Take 0.5 tablets (12.5 mg total) by mouth  daily. 12/02/17  Yes Biagio Borg, MD  ondansetron (ZOFRAN-ODT) 4 MG disintegrating tablet Take 1 tablet (4 mg total) by mouth every 8 (eight) hours as needed for nausea or vomiting. 03/06/18  Yes Lance Sell, NP  pioglitazone (ACTOS) 15 MG tablet Take 1 tablet (15 mg total) by mouth daily. 12/02/17  Yes Biagio Borg, MD  traZODone (DESYREL) 100 MG tablet TAKE 1 TABLET BY MOUTH EVERYDAY AT BEDTIME 12/02/17  Yes Biagio Borg, MD  XIIDRA 5 % SOLN Place 1 drop into both eyes 2 (two) times daily. 01/03/18  Yes [provider]  albuterol (PROAIR HFA) 108 (90 Base) MCG/ACT inhaler Inhale 2 puffs into the lungs every 4 (four) hours as needed for wheezing or shortness of breath (cough). 12/02/17   Biagio Borg, MD  glucose blood [facility] VERIO) test strip Use to check blood sugar 1 time per day. 10/19/15   Renato Shin, MD  hyoscyamine (LEVSIN SL) 0.125 MG SL tablet Take 1-2 tablets by mouth or under tongue every 4 hours as needed. Patient taking differently: Take 0.125-0.25 mg by mouth every 4 (four) hours as needed for cramping (stomach spasms).  01/14/15   Renato Shin, MD  [facility] DELICA LANCETS FINE MISC Use to check blood sugar 1 time per day. 10/19/15   Renato Shin, MD  Oxycodone HCl 10 MG TABS Take 10 mg by mouth daily as needed (pain).  12/13/17   [provider]    Family History Family History  Problem Relation Age of Onset  . Colon cancer Mother 54  . Diabetes Mother   . Kidney disease Mother   . Heart attack Father   . Heart disease Father   . Allergies Sister   . Asthma Sister   . Clotting disorder Grandchild   . Heart disease Paternal Grandmother   . Esophageal cancer Neg Hx   . Rectal cancer Neg Hx   . Stomach cancer Neg Hx     Social History Social History   Tobacco Use  . Smoking status: Former Smoker    Packs/day: 0.30    Years: 2.00    Pack years: 0.60    Types: Cigarettes    Last attempt to quit: 02/27/1975    Years since quitting: 43.0    . Smokeless tobacco: Never Used  Substance Use Topics  . Alcohol use: No  . Drug use: No     Allergies   Erythromycin   Review of Systems Review of Systems  Unable to perform ROS: Mental status change  Constitutional: Positive for fever.  Respiratory: Positive for cough.   Neurological:       + confusion     Physical Exam Updated Vital Signs BP (!) 131/55 (BP Location: Right Arm)   Pulse 73   Temp 99.6 F (37.6 C) (Axillary)   Resp 16   SpO2 100%   Physical Exam Vitals signs and nursing note reviewed.  Constitutional:      General: She is not in acute distress.    Appearance: She is well-developed.     Comments: Ill-appearing.  HENT:     Head: Normocephalic and atraumatic.  Cardiovascular:     Rate and Rhythm: Normal rate and regular rhythm.     Heart sounds: Normal heart sounds. No murmur.  Pulmonary:     Effort: Pulmonary effort is normal. No respiratory distress.     Breath sounds: Normal breath sounds.  Abdominal:     General: There is no distension.     Palpations: Abdomen is soft.     Comments: Grimaces and grabs at my hands with palpation to the abdomen.  Skin:    General: Skin is warm and dry.  Neurological:     Mental Status: She is alert.     Comments: Confused.  Will not answer questions.  Trying to get out of the bed, stating she just needs to go to sleep.      ED Treatments / Results  Labs (all labs ordered are listed, but only abnormal results are displayed) Labs Reviewed  COMPREHENSIVE METABOLIC PANEL - Abnormal; Notable for the following components:      Result Value   Potassium 2.3 (*)    Glucose, Bld 200 (*)    BUN 25 (*)    Creatinine, Ser 1.50 (*)    Calcium 8.1 (*)    Total Bilirubin 2.4 (*)    GFR calc non Af Amer 37 (*)    GFR calc Af Amer 43 (*)    All other components within normal limits  CBC WITH DIFFERENTIAL/PLATELET - Abnormal; Notable for the following components:   RBC 3.64 (*)    Hemoglobin 11.0 (*)    HCT  32.3 (*)    Neutro Abs 8.4 (*)    Lymphs Abs 0.4 (*)    All other components within normal limits  URINALYSIS, ROUTINE W REFLEX MICROSCOPIC - Abnormal; Notable for the following components:   Hgb urine dipstick SMALL (*)    Ketones, ur 20 (*)    Protein, ur 100 (*)    All other components within normal limits  CULTURE, BLOOD (ROUTINE X 2)  CULTURE, BLOOD (ROUTINE X 2)  URINE CULTURE  RESPIRATORY PANEL BY PCR  LIPASE, BLOOD  INFLUENZA PANEL BY PCR (TYPE A & B)  BASIC METABOLIC PANEL  I-STAT CG4 LACTIC ACID, ED  I-STAT CG4 LACTIC ACID, ED    EKG EKG Interpretation  Date/Time:  Thursday March 06 2018 19:56:19 EST Ventricular Rate:  87 PR Interval:    QRS Duration: 95 QT Interval:  389 QTC Calculation: 463 R Axis:   3 Text Interpretation:  Sinus rhythm Low voltage, precordial leads Borderline repol abnormality, diffuse leads Baseline wander in lead(s) II III aVR aVF V4 V6 Poor data quality No significant change since last tracing Confirmed by Dorie Rank [phone]) on 03/06/2018 7:59:46 PM   Radiology Dg Chest 2 View  Result Date: 03/06/2018 CLINICAL DATA:  Fever and productive cough EXAM: CHEST - 2 VIEW COMPARISON:  04/16/2016 FINDINGS: The heart size and mediastinal contours are within normal limits. Both  lungs are clear. The visualized skeletal structures are unremarkable. IMPRESSION: No active cardiopulmonary disease. Electronically Signed   By: Inez Catalina M.D.   On: 03/06/2018 15:13   Ct Abdomen Pelvis W Contrast  Result Date: 03/06/2018 CLINICAL DATA:  Fever and vomiting EXAM: CT ABDOMEN AND PELVIS WITH CONTRAST TECHNIQUE: Multidetector CT imaging of the abdomen and pelvis was performed using the standard protocol following bolus administration of intravenous contrast. CONTRAST:  131mL ISOVUE-300 IOPAMIDOL (ISOVUE-300) INJECTION 61% COMPARISON:  05/17/2017 FINDINGS: Lower chest: Mild dependent atelectatic changes are noted. No focal infiltrate or sizable effusion is seen.  Hepatobiliary: No focal liver abnormality is seen. No gallstones, gallbladder wall thickening, or biliary dilatation. Pancreas: Unremarkable. No pancreatic ductal dilatation or surrounding inflammatory changes. Spleen: Normal in size without focal abnormality. Adrenals/Urinary Tract: Adrenal glands are within normal limits. Left kidney is within normal limits. Right kidney demonstrates evidence of partial upper pole nephrectomy stable from the prior exam. Nonobstructing lower pole renal stones are noted on the right. The previously seen renal pelvic stones are no longer identified. The bladder is partially decompressed. No ureteral stones are seen. Stomach/Bowel: Scattered air and fecal material is noted throughout the colon. No obstructive or inflammatory changes are seen. The appendix is within normal limits. No small bowel abnormality is noted. The stomach is within normal limits. Vascular/Lymphatic: Aortic atherosclerosis. No enlarged abdominal or pelvic lymph nodes. Reproductive: Status post hysterectomy. No adnexal masses. Other: No abdominal wall hernia or abnormality. No abdominopelvic ascites. Musculoskeletal: Degenerative changes of lumbar spine are noted. IMPRESSION: Stable nonobstructing right renal stones. Previously seen right renal pelvic stones are no longer identified. Mild dependent atelectatic changes. Postoperative changes in the upper pole of the right kidney. No acute abnormality noted. Electronically Signed   By: Inez Catalina M.D.   On: 03/06/2018 21:47   Dg Chest Port 1 View  Result Date: 03/06/2018 CLINICAL DATA:  Fever.  Altered mental status. EXAM: PORTABLE CHEST 1 VIEW COMPARISON:  03/06/2018 FINDINGS: Shallow inspiration. Normal heart size and pulmonary vascularity. No focal airspace disease or consolidation in the lungs. No blunting of costophrenic angles. No pneumothorax. Mediastinal contours appear intact. IMPRESSION: No active disease. Electronically Signed   By: Lucienne Capers  M.D.   On: 03/06/2018 21:28    Procedures Procedures (including critical care time)  Medications Ordered in ED Medications  iopamidol (ISOVUE-300) 61 % injection (has no administration in time range)  sodium chloride (PF) 0.9 % injection (has no administration in time range)  potassium chloride 10 mEq in 100 mL IVPB (has no administration in time range)  acetaminophen (TYLENOL) tablet 1,000 mg ( Oral See Alternative 03/06/18 2027)    Or  acetaminophen (TYLENOL) suppository 650 mg (650 mg Rectal Given 03/06/18 2027)  sodium chloride 0.9 % bolus 1,000 mL (0 mLs Intravenous Stopped 03/06/18 2130)  LORazepam (ATIVAN) injection 0.5 mg (0.5 mg Intravenous Given 03/06/18 2028)  iopamidol (ISOVUE-300) 61 % injection 100 mL (100 mLs Intravenous Contrast Given 03/06/18 2054)  ceFEPIme (MAXIPIME) 2 g in sodium chloride 0.9 % 100 mL IVPB (0 g Intravenous Stopped 03/06/18 2227)  vancomycin (VANCOCIN) 2,000 mg in sodium chloride 0.9 % 500 mL IVPB (2,000 mg Intravenous New Bag/Given 03/06/18 2216)  LORazepam (ATIVAN) injection 0.5 mg (0.5 mg Intravenous Given 03/06/18 2057)     Initial Impression / Assessment and Plan / ED Course  I have reviewed the triage vital signs and the nursing notes.  Pertinent labs & imaging results that were available during my care of the  patient were reviewed by me and considered in my medical decision making (see chart for details).    Savannah [name] is a 63 y.o. female who presents to ED for altered mental status and fever. Temperature of 102 axillary.  She is not tachycardic on my examination.  She is tachypneic, but quite anxious as well.  Code sepsis initiated.  Blood cultures obtained and broad-spectrum antibiotics begun.  1 L fluid bolus given.  She has blood pressure of 130/80 upon arrival.  She is quite confused. She reportedly had URI symptoms per PCP visit this morning. Per chart review "Ms Ligman is here today requesting evaluation of acute complaint of cough/cold symptoms,  which first began with productive cough about 2 weeks ago, fevers, ear and sinus pain and pressure, ear drainage, sore throat, body aches, dizziness, decreased appetite, malaise." She was told to come to the emergency department at that time, but did not. Started on Augmentin. Daughter reports that she is now very altered since PCP appt today, therefore they brought her in to ER. Chest x-ray without signs of pneumonia. UA without signs of infection. Grabbing her stomach and does have generalized tenderness.  CT abdomen and pelvis without acute findings. Flu negative. Labs reviewed: normal white count and lactic. Profound hypokalemia at 2.3. Started IV repletion. Given confusion, with no clear source of fever, CT head added. Discussed case with hospitalist who will admit for further workup. CT head pending.    Patient seen by and discussed with Dr. Tomi Bamberger who agrees with treatment plan.    Final Clinical Impressions(s) / ED Diagnoses   Final diagnoses:  Fever of unknown origin  Confusion    ED Discharge Orders    None       Teddrick Mallari, Ozella Almond, PA-C 03/07/18 Calla Kicks, MD 03/10/18 [phone]

## 2018-03-06 NOTE — Progress Notes (Signed)
A consult was received from an ED physician for vancomycin and cefepime per pharmacy dosing.  The patient's profile has been reviewed for ht/wt/allergies/indication/available labs.   A one time order has been placed for cefepime 2gm and vancomycin 2gm.    Further antibiotics/pharmacy consults should be ordered by admitting physician if indicated.                       Thank you, Dolly Rias RPh 03/06/2018, 8:50 PM Pager [phone]

## 2018-03-06 NOTE — Patient Instructions (Signed)
Head downstairs for labs and chest xray  Start augmentin twice daily for 7 days  Please return on Monday for a follow up visit so we can see how you are doing  If you develop new symptoms, start to feel worse, or can not tolerate any oral intake please go immediately to the ER for further evaluation.   Fever, Adult   A fever is an increase in your body's temperature. It often means a temperature of 100.83F (38C) or higher. Brief mild or moderate fevers often have no long-term effects. They often do not need treatment. Moderate or high fevers may make you feel uncomfortable. Sometimes, they can be a sign of a serious illness or disease. A fever that keeps coming back or that lasts a long time may cause you to lose water in your body (get dehydrated). You can take your temperature with a thermometer to see if you have a fever. Temperature can change with:  Age.  Time of day.  Where the thermometer is put in the body. Readings may vary when the thermometer is put: ? In the mouth (oral). ? In the butt (rectal). ? In the ear (tympanic). ? Under the arm (axillary). ? On the forehead (temporal). Follow these instructions at home: Medicines  Take over-the-counter and prescription medicines only as told by your doctor. Follow the dosing instructions carefully.  If you were prescribed an antibiotic medicine, take it as told by your doctor. Do not stop taking it even if you start to feel better. General instructions  Watch for any changes in your symptoms. Tell your doctor about them.  Rest as needed.  Drink enough fluid to keep your pee (urine) pale yellow.  Sponge yourself or bathe with room-temperature water as needed. This helps to lower your body temperature. Do not use ice water.  Do not use too many blankets or wear clothes that are too heavy.  If your fever was caused by an infection that spreads from person to person (is contagious), such as a cold or the flu: ? You should  stay home from work and public places for at least 24 hours after your fever is gone. ? Your fever should be gone for at least 24 hours without the need to use medicines. Contact a doctor if:  You throw up (vomit).  You cannot eat or drink without throwing up.  You have watery poop (diarrhea).  It hurts when you pee.  Your symptoms do not get better with treatment.  You have new symptoms.  You feel very weak. Get help right away if:  You are short of breath or have trouble breathing.  You are dizzy or you pass out (faint).  You feel mixed up (confused).  You have signs of not having enough water in your body, such as: ? Dark pee, very little pee, or no pee. ? Cracked lips. ? Dry mouth. ? Sunken eyes. ? Sleepiness. ? Weakness.  You have very bad pain in your belly (abdomen).  You keep throwing up or having watery poop.  You have a rash on your skin.  Your symptoms get worse all of a sudden. Summary  A fever is an increase in your body's temperature. It often means a temperature of 100.83F (38C) or higher.  Watch for any changes in your symptoms. Tell your doctor about them.  Take all medicines only as told by your doctor.  Do not go to work or other public places if your fever was caused by  an illness that can spread to other people.  Get help right away if you have signs that you do not have enough water in your body. This information is not intended to replace advice given to you by your health care provider. Make sure you discuss any questions you have with your health care provider. Document Released: 11/22/2007 Document Revised: 07/29/2017 Document Reviewed: 07/29/2017 Elsevier Interactive Patient Education  2019 [place].  Upper Respiratory Infection, Adult An upper respiratory infection (URI) affects the nose, throat, and upper air passages. URIs are caused by germs (viruses). The most common type of URI is often called "the common cold." Medicines  cannot cure URIs, but you can do things at home to relieve your symptoms. URIs usually get better within 7-10 days. Follow these instructions at home: Activity  Rest as needed.  If you have a fever, stay home from work or school until your fever is gone, or until your doctor says you may return to work or school. ? You should stay home until you cannot spread the infection anymore (you are not contagious). ? Your doctor may have you wear a face mask so you have less risk of spreading the infection. Relieving symptoms  Gargle with a salt-water mixture 3-4 times a day or as needed. To make a salt-water mixture, completely dissolve -1 tsp of salt in 1 cup of warm water.  Use a cool-mist humidifier to add moisture to the air. This can help you breathe more easily. Eating and drinking   Drink enough fluid to keep your pee (urine) pale yellow.  Eat soups and other clear broths. General instructions   Take over-the-counter and prescription medicines only as told by your doctor. These include cold medicines, fever reducers, and cough suppressants.  Do not use any products that contain nicotine or tobacco. These include cigarettes and e-cigarettes. If you need help quitting, ask your doctor.  Avoid being where people are smoking (avoid secondhand smoke).  Make sure you get regular shots and get the flu shot every year.  Keep all follow-up visits as told by your doctor. This is important. How to avoid spreading infection to others   Wash your hands often with soap and water. If you do not have soap and water, use hand sanitizer.  Avoid touching your mouth, face, eyes, or nose.  Cough or sneeze into a tissue or your sleeve or elbow. Do not cough or sneeze into your hand or into the air. Contact a doctor if:  You are getting worse, not better.  You have any of these: ? A fever. ? Chills. ? Brown or red mucus in your nose. ? Yellow or brown fluid (discharge)coming from your  nose. ? Pain in your face, especially when you bend forward. ? Swollen neck glands. ? Pain with swallowing. ? White areas in the back of your throat. Get help right away if:  You have shortness of breath that gets worse.  You have very bad or constant: ? Headache. ? Ear pain. ? Pain in your forehead, behind your eyes, and over your cheekbones (sinus pain). ? Chest pain.  You have long-lasting (chronic) lung disease along with any of these: ? Wheezing. ? Long-lasting cough. ? Coughing up blood. ? A change in your usual mucus.  You have a stiff neck.  You have changes in your: ? Vision. ? Hearing. ? Thinking. ? Mood. Summary  An upper respiratory infection (URI) is caused by a germ called a virus. The most common  type of URI is often called "the common cold."  URIs usually get better within 7-10 days.  Take over-the-counter and prescription medicines only as told by your doctor. This information is not intended to replace advice given to you by your health care provider. Make sure you discuss any questions you have with your health care provider. Document Released: 08/01/2007 Document Revised: 10/05/2016 Document Reviewed: 10/05/2016 Elsevier Interactive Patient Education  2019 [employer].

## 2018-03-06 NOTE — Addendum Note (Signed)
Addended by: Lance Sell on: 03/06/2018 05:10 PM   Modules accepted: Orders

## 2018-03-06 NOTE — ED Triage Notes (Signed)
The patient has been vomiting fo rthe last few days. While at her MD office, she had a tempeture of 103. EMS reports a current tempeture of 102. She normally has to do an in and out catheter regularly in order to void.  EMS vitals and CBG: 90%- O2 sats on room air 194- CBG 114/70- BP 84- HR  18- Resp Rate

## 2018-03-06 NOTE — Telephone Encounter (Signed)
Patient was seen Caesar Chestnut, NP today for fever and cough. The daughter is calling for xray results and nausea and vertigo medication to be phoned to pharmacy. Requesting cough medication for her as well. She reports her temperature continues to elevate with tylenol. It is 101 at this time.  TC to PCP-Brittany reports nausea and vertigo medication will be phoned at this time and PCP will follow up in the morning with test results are available. Informed the daughter. Also reviewed tylenol and advil alternating instructions. Stated if patient continues to decline she will need to call 911 for evaluation. This was suggested to her at today's visit but declined to go to the ER.   Reason for Disposition . [1] Follow-up call to recent contact AND [2] information only call, no triage required  Answer Assessment - Initial Assessment Questions 1. REASON FOR CALL or QUESTION: "What is your reason for calling today?" or "How can I best help you?" or "What question do you have that I can help answer?"     Patient was seen by Caesar Chestnut, NP today.  Protocols used: INFORMATION ONLY CALL-A-AH

## 2018-03-06 NOTE — Telephone Encounter (Signed)
Copied from [place] [id number]. Topic: Quick Communication - See Telephone Encounter >> Mar 06, 2018  1:01 PM Conception Chancy, NT wrote: CRM for notification. See Telephone encounter for: 03/06/18.  Patient daughter is calling and states that only amoxicillin was called in after today appointment and states that she is needing something for nausea and vertigo. Please advise.  [id number] [place], [place] [address] [place] [address] Phone: [phone] Fax: [phone]

## 2018-03-06 NOTE — Telephone Encounter (Signed)
I have sent a prescription for zofran to her pharmacy.

## 2018-03-06 NOTE — ED Notes (Signed)
Bed: WA01 Expected date:  Expected time:  Means of arrival:  Comments: EMS AMS, fever

## 2018-03-06 NOTE — Progress Notes (Signed)
Savannah [name] is a 63 y.o. female with the following history as recorded in EpicCare:  Patient Active Problem List   Diagnosis Date Noted  . B12 deficiency 09/19/2017  . Numbness 04/17/2017  . Achilles tendinitis, left leg 04/26/2016  . Ankle pain, left 04/17/2016  . UTI (urinary tract infection) 04/17/2016  . Contusion of knee 01/06/2016  . Foot contusion 01/06/2016  . Gastric peptic ulcer 04/15/2015  . Cardiomegaly 01/14/2015  . Postoperative anemia due to acute blood loss 06/25/2014  . Renal mass 05/19/2014  . Dyspnea 10/28/2013  . Renal insufficiency 08/07/2013  . Hypokalemia 03/11/2013  . Sinusitis, acute 02/04/2013  . Right wrist pain 08/26/2012  . Neoplasm of uncertain behavior of skin 08/26/2012  . Routine general medical examination at a health care facility 08/02/2012  . Nausea with vomiting 08/01/2012  . Abdominal pain, epigastric 01/15/2012  . Vertigo 07/24/2011  . Vitamin D deficiency 12/09/2010  . Hyperparathyroidism ([place]) 12/06/2010  . Dysuria 10/23/2010  . Cough 10/23/2010  . Muscle weakness 07/14/2010  . ARTHRPATH W/OTH ENDOCRINE&METABOLIC DISORDERS 70/35/0093  . BACK PAIN, LUMBAR 12/29/2009  . URINARY CALCULUS 11/08/2009  . Obstructive sleep apnea 08/09/2009  . HEMATURIA UNSPECIFIED 06/28/2009  . OTITIS MEDIA, LEFT 06/14/2009  . Palpitations 05/23/2009  . HYPERCHOLESTEROLEMIA 04/18/2009  . 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 CHEST PAIN 04/18/2009  . Iron deficiency anemia 12/06/2008  . DIZZINESS 12/06/2008  . NASH (nonalcoholic steatohepatitis) 03/19/2008  . ANKLE SPRAIN, RIGHT 03/19/2008  . Asymptomatic postmenopausal status 03/19/2008  . ALLERGIC RHINITIS CAUSE UNSPECIFIED 12/29/2007  . ASTHMATIC BRONCHITIS, ACUTE 08/16/2007  . NECK PAIN 06/18/2007  . Diabetes ([place]) 04/18/2007  . HYPOKALEMIA 01/15/2007  . Hypothyroidism 01/07/2007  . Essential hypertension 09/27/2006  . ASTHMA 09/27/2006    Current Outpatient Medications   Medication Sig Dispense Refill  . albuterol (PROAIR HFA) 108 (90 Base) MCG/ACT inhaler Inhale 2 puffs into the lungs every 4 (four) hours as needed for wheezing or shortness of breath (cough). 3 Inhaler 3  . atorvastatin (LIPITOR) 40 MG tablet Take 1 tablet (40 mg total) by mouth daily. 90 tablet 3  . cholecalciferol (VITAMIN D) 1000 units tablet Take 1,000 Units by mouth 2 (two) times daily.    . citalopram (CELEXA) 20 MG tablet TAKE 1 TABLET BY MOUTH EVERY DAY--- takes in am 90 tablet 3  . Cyanocobalamin (B-12 COMPLIANCE INJECTION IJ) Inject as directed every 30 (thirty) days.    . ferrous sulfate 325 (65 FE) MG tablet Take 325 mg by mouth daily with breakfast.    . Fluticasone-Salmeterol (ADVAIR) 100-50 MCG/DOSE AEPB Inhale 1 puff into the lungs 2 (two) times daily. 3 each 3  . gabapentin (NEURONTIN) 300 MG capsule TAKE 1 CAPSULE BY MOUTH EVERYDAY AT BEDTIME 90 capsule 3  . glucose blood (ONETOUCH VERIO) test strip Use to check blood sugar 1 time per day. 100 each 3  . hyoscyamine (LEVSIN SL) 0.125 MG SL tablet Take 1-2 tablets by mouth or under tongue every 4 hours as needed. (Patient taking differently: Take 0.125-0.25 mg by mouth every 4 (four) hours as needed for cramping (stomach spasms). ) 100 tablet 5  . levothyroxine (SYNTHROID, LEVOTHROID) 137 MCG tablet Take 1 tablet (137 mcg total) by mouth daily before breakfast. 90 tablet 3  . losartan-hydrochlorothiazide (HYZAAR) 100-12.5 MG tablet Take 1 tablet by mouth daily. 90 tablet 3  . metFORMIN (GLUCOPHAGE-XR) 500 MG 24 hr tablet Take 2 tablets (1,000 mg total) by mouth 2 (two) times daily. TAKE 2 TABLETS BY  MOUTH TWICE A DAY 360 tablet 3  . metoprolol succinate (TOPROL-XL) 25 MG 24 hr tablet Take 0.5 tablets (12.5 mg total) by mouth daily. 45 tablet 3  . ONETOUCH DELICA LANCETS FINE MISC Use to check blood sugar 1 time per day. 100 each 2  . Oxycodone HCl 10 MG TABS as needed.  0  . pioglitazone (ACTOS) 15 MG tablet Take 1 tablet (15 mg  total) by mouth daily. 90 tablet 3  . traZODone (DESYREL) 100 MG tablet TAKE 1 TABLET BY MOUTH EVERYDAY AT BEDTIME 90 tablet 1  . XIIDRA 5 % SOLN Place 1 drop into both eyes 2 (two) times daily.  3  . amoxicillin-clavulanate (AUGMENTIN) 875-125 MG tablet Take 1 tablet by mouth 2 (two) times daily. 14 tablet 0   No current facility-administered medications for this visit.     Allergies: Erythromycin  Past Medical History:  Diagnosis Date  . Allergic rhinitis   . Asthma   . B12 deficiency   . Depression   . Grade I internal hemorrhoids   . Hiatal hernia   . History of colon polyps   . History of kidney stones   . 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 Hypercholesterolemia   . Hypertension   . Hypothyroidism    endocrinologist -  dr Hale Bogus  . Iron deficiency anemia   . Lumbar spondylosis   . NAFLD (nonalcoholic fatty liver disease)    dx 2010--- last hepatic panel in epic dated 04-17-2017  . Nocturia   . OA (osteoarthritis of spine)    Lumbar  . OSA on CPAP pulmologist-  dr sood   sleep study 09-11-2004  very severe osa , AHI 119/hr,  setting 10  . Peripheral neuropathy    bottom of feet  . Renal calculus, right   . Type 2 diabetes mellitus ([place])    endocrinoloigst-  dr Loanne Drilling,  last A1c 5.9 on 02-20-219 in epic  . Vitamin B 12 deficiency   . Wears glasses     Past Surgical History:  Procedure Laterality Date  . BUNIONECTOMY/  HAMMERTOE CORRECTION'S  09-07-2013   SCG   RIGHT FOOT 2ND, 3RD, 4TH, 5TH DIGITS  . CARDIAC CATHETERIZATION  06-01-2009  dr Angelena Form   no evidence CAD,  normal LVSF, elevated blood pressure w/ elevated end-diastolic pressure, ef 62-37%  . CARDIOVASCULAR STRESS TEST  05-11-2009   dr Angelena Form   abnormal lexiscan nuclear study (no exercise) w/ mild ischemia in the distal anteroseptal wall and apex, this  defect may be due to shifting breast attenuation  . COLONOSCOPY WITH SNARE POLYPECTOMY WITH ESOPHAGOGASTRODUODENOSCOPY  02/08/2014   with biopsy Dr. Jerilynn Mages. [place] Plan  . CYSTOSCOPY WITH RETROGRADE PYELOGRAM, URETEROSCOPY AND STENT PLACEMENT Right 06/07/2017   Procedure: CYSTOSCOPY WITH RETROGRADE PYELOGRAM, URETEROSCOPY, LITHOTRIPSY,  AND STENT PLACEMENT 1st STAGE;  Surgeon: Alexis Frock, MD;  Location[facility];  Service: Urology;  Laterality: Right;  . CYSTOSCOPY WITH RETROGRADE PYELOGRAM, URETEROSCOPY AND STENT PLACEMENT Right 06/21/2017   Procedure: CYSTOSCOPY WITH RETROGRADE PYELOGRAM, URETEROSCOPY AND STENT PLACEMENT 2ND STAGE  STENT" EXCHANGE";  Surgeon: Alexis Frock, MD;  Location: [facility];  Service: Urology;  Laterality: Right;  . FRACTURE SURGERY Right    Compound fracture foot  . HOLMIUM LASER APPLICATION Right 08/23/3149   Procedure: HOLMIUM LASER APPLICATION;  Surgeon: Alexis Frock, MD;  Location: [place];  Service: Urology;  Laterality: Right;  . HOLMIUM LASER APPLICATION Right 6/81/2751   Procedure: HOLMIUM LASER APPLICATION;  Surgeon: Alexis Frock, MD;  Location: [facility];  Service: Urology;  Laterality: Right;  . KNEE ARTHROSCOPY Left 1993  . ROBOTIC ASSITED PARTIAL NEPHRECTOMY Right 05/19/2014   Procedure: ROBOTIC ASSITED PARTIAL NEPHRECTOMY;  Surgeon: Alexis Frock, MD;  Location: [facility];  Service: Urology;  Laterality: Right;    Family History  Problem Relation Age of Onset  . Colon cancer Mother 54  . Diabetes Mother   . Kidney disease Mother   . Heart attack Father   . Heart disease Father   . Allergies Sister   . Asthma Sister   . Clotting disorder Grandchild   . Heart disease Paternal Grandmother   . Esophageal cancer Neg Hx   . Rectal cancer Neg Hx   . Stomach cancer Neg Hx     Social History   Tobacco Use  . Smoking status: Former Smoker    Packs/day: 0.30    Years: 2.00    Pack  years: 0.60    Types: Cigarettes    Last attempt to quit: 02/27/1975    Years since quitting: 43.0  . Smokeless tobacco: Never Used  Substance Use Topics  . Alcohol use: No     Subjective:  Ms Lafave is here today requesting evaluation of acute complaint of cough/cold symptoms, which first began with productive cough about 2 weeks ago, fevers, ear and sinus pain and pressure, ear drainage, sore throat, body aches, dizziness, decreased appetite, malaise. Symptoms no better since onset. Her daughter finally talked her into coming today, but says she has not looked well since last week Denies syncope, confusion, chest pain, shortness of breath, vomiting, bowel or bladder changes. Tried at home: coricidin, cold and flu medication with no relief, has not taken anything for fevers Smoker: no Recent travel: no  Sick contacts: family members in house with similar symptoms    ROS- See HPI  Objective:  Vitals:   03/06/18 1059  BP: 130/80  Pulse: 66  Temp: (!) 103.1 F (39.5 C)  TempSrc: Oral  SpO2: 94%  Weight: 234 lb (106.1 kg)  Height: 5' 8.5" (1.74 m)    General: Well developed, well nourished, ill appearing, obese, in wheelchair Skin : Warm and dry.  Head: Normocephalic and atraumatic  Eyes: Sclera and conjunctiva clear; pupils round and reactive to light; extraocular movements intact  Ears: External normal; canals erythematous and swollen with scant cerumen, tympanic membranes erythematous Oropharynx: Posterior oropharyngeal erythema. No suspicious lesions  Neck: Supple without thyromegaly, adenopathy  Lungs: Respirations unlabored; clear to auscultation bilaterally  CVS exam: normal rate and regular rhythm, S1 and S2 normal.  Vessels: Symmetric bilaterally  Neurologic: Alert and oriented; speech intact; face symmetrical; moves all extremities well; CNII-XII intact without focal deficit  Psychiatric: Normal mood and affect.  Assessment:  1. Upper respiratory tract infection,  unspecified type   2. Fever, unspecified fever cause   3. Acute otitis media, unspecified otitis media type     Plan:   Due to her age, multiple co morbidities, high fever and ill appearance, I advised transfer to the ED for further evaluation but she refuses Stat labs and CXR ordered, augmentin course sent-medication dosing and side effects discussed Strict return precautions, home management of symptoms/fevers discussed and printed on AVS Family agrees to take patient immediately to ER for any new, worsening symptoms  Otherwise I  have asked for patient to return Monday for follow up visit in the office  No follow-ups on file.  Orders Placed This Encounter  Procedures  . Blood culture (routine single)    Standing Status:   Future    Standing Expiration Date:   03/06/2019  . DG Chest 2 View    Standing Status:   Future    Standing Expiration Date:   05/05/2019    Order Specific Question:   Reason for Exam (SYMPTOM  OR DIAGNOSIS REQUIRED)    Answer:   cough, fevers    Order Specific Question:   Preferred imaging location?    Answer:   [facility]    Order Specific Question:   Radiology Contrast Protocol - do NOT remove file path    Answer:   [email or web address]  . CBC    Standing Status:   Future    Standing Expiration Date:   03/07/2019  . Basic metabolic panel    Standing Status:   Future    Standing Expiration Date:   03/07/2019    Requested Prescriptions   Signed Prescriptions Disp Refills  . amoxicillin-clavulanate (AUGMENTIN) 875-125 MG tablet 14 tablet 0    Sig: Take 1 tablet by mouth 2 (two) times daily.

## 2018-03-07 ENCOUNTER — Observation Stay (HOSPITAL_COMMUNITY): Payer: BLUE CROSS/BLUE SHIELD

## 2018-03-07 ENCOUNTER — Emergency Department (HOSPITAL_COMMUNITY): Payer: BLUE CROSS/BLUE SHIELD

## 2018-03-07 DIAGNOSIS — R410 Disorientation, unspecified: Secondary | ICD-10-CM | POA: Diagnosis not present

## 2018-03-07 DIAGNOSIS — I10 Essential (primary) hypertension: Secondary | ICD-10-CM

## 2018-03-07 DIAGNOSIS — A419 Sepsis, unspecified organism: Secondary | ICD-10-CM | POA: Diagnosis present

## 2018-03-07 DIAGNOSIS — R6520 Severe sepsis without septic shock: Secondary | ICD-10-CM

## 2018-03-07 DIAGNOSIS — G9340 Encephalopathy, unspecified: Secondary | ICD-10-CM

## 2018-03-07 DIAGNOSIS — G9341 Metabolic encephalopathy: Secondary | ICD-10-CM

## 2018-03-07 DIAGNOSIS — E876 Hypokalemia: Secondary | ICD-10-CM

## 2018-03-07 LAB — CBC
HCT: 30.9 % — ABNORMAL LOW (ref 36.0–46.0)
Hemoglobin: 10.3 g/dL — ABNORMAL LOW (ref 12.0–15.0)
MCH: 30.3 pg (ref 26.0–34.0)
MCHC: 33.3 g/dL (ref 30.0–36.0)
MCV: 90.9 fL (ref 80.0–100.0)
Platelets: 131 10*3/uL — ABNORMAL LOW (ref 150–400)
RBC: 3.4 MIL/uL — ABNORMAL LOW (ref 3.87–5.11)
RDW: 13.5 % (ref 11.5–15.5)
WBC: 10.4 10*3/uL (ref 4.0–10.5)
nRBC: 0 % (ref 0.0–0.2)

## 2018-03-07 LAB — RESPIRATORY PANEL BY PCR
Adenovirus: NOT DETECTED
Bordetella pertussis: NOT DETECTED
Chlamydophila pneumoniae: NOT DETECTED
Coronavirus 229E: NOT DETECTED
Coronavirus HKU1: NOT DETECTED
Coronavirus NL63: NOT DETECTED
Coronavirus OC43: NOT DETECTED
Influenza A: NOT DETECTED
Influenza B: NOT DETECTED
MYCOPLASMA PNEUMONIAE-RVPPCR: NOT DETECTED
Metapneumovirus: NOT DETECTED
PARAINFLUENZA VIRUS 1-RVPPCR: DETECTED — AB
Parainfluenza Virus 2: NOT DETECTED
Parainfluenza Virus 3: NOT DETECTED
Parainfluenza Virus 4: NOT DETECTED
Respiratory Syncytial Virus: NOT DETECTED
Rhinovirus / Enterovirus: NOT DETECTED

## 2018-03-07 LAB — COMPREHENSIVE METABOLIC PANEL
ALBUMIN: 3.4 g/dL — AB (ref 3.5–5.0)
ALT: 13 U/L (ref 0–44)
AST: 14 U/L — ABNORMAL LOW (ref 15–41)
Alkaline Phosphatase: 42 U/L (ref 38–126)
Anion gap: 11 (ref 5–15)
BUN: 24 mg/dL — ABNORMAL HIGH (ref 8–23)
CHLORIDE: 105 mmol/L (ref 98–111)
CO2: 24 mmol/L (ref 22–32)
Calcium: 7.9 mg/dL — ABNORMAL LOW (ref 8.9–10.3)
Creatinine, Ser: 1.41 mg/dL — ABNORMAL HIGH (ref 0.44–1.00)
GFR calc Af Amer: 46 mL/min — ABNORMAL LOW (ref >60)
GFR calc non Af Amer: 40 mL/min — ABNORMAL LOW (ref >60)
Glucose, Bld: 202 mg/dL — ABNORMAL HIGH (ref 70–99)
POTASSIUM: 3.2 mmol/L — AB (ref 3.5–5.1)
Sodium: 140 mmol/L (ref 135–145)
Total Bilirubin: 1.9 mg/dL — ABNORMAL HIGH (ref 0.3–1.2)
Total Protein: 6.8 g/dL (ref 6.5–8.1)

## 2018-03-07 LAB — CBG MONITORING, ED
GLUCOSE-CAPILLARY: 175 mg/dL — AB (ref 70–99)
Glucose-Capillary: 118 mg/dL — ABNORMAL HIGH (ref 70–99)

## 2018-03-07 LAB — TSH: TSH: 2.494 u[IU]/mL (ref 0.350–4.500)

## 2018-03-07 LAB — PROCALCITONIN: Procalcitonin: 3.91 ng/mL

## 2018-03-07 LAB — I-STAT CG4 LACTIC ACID, ED: Lactic Acid, Venous: 1.15 mmol/L (ref 0.5–1.9)

## 2018-03-07 LAB — AMMONIA: Ammonia: 28 umol/L (ref 9–35)

## 2018-03-07 LAB — GLUCOSE, CAPILLARY
GLUCOSE-CAPILLARY: 93 mg/dL (ref 70–99)
Glucose-Capillary: 125 mg/dL — ABNORMAL HIGH (ref 70–99)

## 2018-03-07 LAB — HIV ANTIBODY (ROUTINE TESTING W REFLEX): HIV Screen 4th Generation wRfx: NONREACTIVE

## 2018-03-07 MED ORDER — LEVOTHYROXINE SODIUM 25 MCG PO TABS
137.0000 ug | ORAL_TABLET | Freq: Every day | ORAL | Status: DC
  Administered 2018-03-07 – 2018-03-11 (×5): 137 ug via ORAL
  Filled 2018-03-07 (×5): qty 1

## 2018-03-07 MED ORDER — SODIUM CHLORIDE 0.9 % IV SOLN
1.0000 g | Freq: Two times a day (BID) | INTRAVENOUS | Status: DC
  Administered 2018-03-07: 1 g via INTRAVENOUS
  Filled 2018-03-07 (×2): qty 1

## 2018-03-07 MED ORDER — ONDANSETRON HCL 4 MG PO TABS
4.0000 mg | ORAL_TABLET | Freq: Four times a day (QID) | ORAL | Status: DC | PRN
  Administered 2018-03-08: 4 mg via ORAL
  Filled 2018-03-07: qty 1

## 2018-03-07 MED ORDER — VANCOMYCIN HCL IN DEXTROSE 1-5 GM/200ML-% IV SOLN: 1000.0000 mg | INTRAVENOUS | Status: DC

## 2018-03-07 MED ORDER — SODIUM CHLORIDE 0.9 % IV SOLN
INTRAVENOUS | Status: DC
  Administered 2018-03-07 – 2018-03-10 (×5): via INTRAVENOUS

## 2018-03-07 MED ORDER — SODIUM CHLORIDE 0.9 % IV SOLN
1.0000 g | INTRAVENOUS | Status: DC
  Administered 2018-03-07 – 2018-03-09 (×3): 1 g via INTRAVENOUS
  Filled 2018-03-07 (×3): qty 1

## 2018-03-07 MED ORDER — ENOXAPARIN SODIUM 40 MG/0.4ML ~~LOC~~ SOLN: 40.0000 mg | SUBCUTANEOUS | Status: DC

## 2018-03-07 MED ORDER — FERROUS SULFATE 325 (65 FE) MG PO TABS
325.0000 mg | ORAL_TABLET | Freq: Every day | ORAL | Status: DC
  Administered 2018-03-08 – 2018-03-10 (×3): 325 mg via ORAL
  Filled 2018-03-07 (×4): qty 1

## 2018-03-07 MED ORDER — LIFITEGRAST 5 % OP SOLN: 1.0000 [drp] | Freq: Two times a day (BID) | OPHTHALMIC | Status: DC

## 2018-03-07 MED ORDER — ACETAMINOPHEN 650 MG RE SUPP: 650.0000 mg | Freq: Four times a day (QID) | RECTAL | Status: DC | PRN

## 2018-03-07 MED ORDER — POTASSIUM CHLORIDE 10 MEQ/100ML IV SOLN
10.0000 meq | INTRAVENOUS | Status: AC
  Administered 2018-03-07 (×5): 10 meq via INTRAVENOUS
  Filled 2018-03-07 (×6): qty 100

## 2018-03-07 MED ORDER — CITALOPRAM HYDROBROMIDE 20 MG PO TABS
20.0000 mg | ORAL_TABLET | Freq: Every day | ORAL | Status: DC
  Administered 2018-03-07 – 2018-03-11 (×5): 20 mg via ORAL
  Filled 2018-03-07 (×2): qty 1
  Filled 2018-03-07: qty 2
  Filled 2018-03-07 (×2): qty 1

## 2018-03-07 MED ORDER — TRAZODONE HCL 100 MG PO TABS: 100.0000 mg | ORAL_TABLET | Freq: Every day | ORAL | Status: DC

## 2018-03-07 MED ORDER — INSULIN ASPART 100 UNIT/ML ~~LOC~~ SOLN
0.0000 [IU] | Freq: Three times a day (TID) | SUBCUTANEOUS | Status: DC
  Administered 2018-03-07: 3 [IU] via SUBCUTANEOUS
  Administered 2018-03-07: 2 [IU] via SUBCUTANEOUS
  Administered 2018-03-09: 3 [IU] via SUBCUTANEOUS
  Administered 2018-03-09: 5 [IU] via SUBCUTANEOUS
  Administered 2018-03-09 – 2018-03-10 (×2): 3 [IU] via SUBCUTANEOUS
  Administered 2018-03-10: 5 [IU] via SUBCUTANEOUS
  Administered 2018-03-11: 3 [IU] via SUBCUTANEOUS
  Filled 2018-03-07: qty 1

## 2018-03-07 MED ORDER — ENOXAPARIN SODIUM 60 MG/0.6ML ~~LOC~~ SOLN
0.5000 mg/kg | SUBCUTANEOUS | Status: DC
  Administered 2018-03-07 – 2018-03-10 (×4): 55 mg via SUBCUTANEOUS
  Filled 2018-03-07: qty 0.55
  Filled 2018-03-07 (×3): qty 0.6

## 2018-03-07 MED ORDER — METOPROLOL SUCCINATE ER 25 MG PO TB24
12.5000 mg | ORAL_TABLET | Freq: Every day | ORAL | Status: DC
  Administered 2018-03-07 – 2018-03-11 (×4): 12.5 mg via ORAL
  Filled 2018-03-07: qty 0.5
  Filled 2018-03-07 (×3): qty 1

## 2018-03-07 MED ORDER — ALBUTEROL SULFATE (2.5 MG/3ML) 0.083% IN NEBU
2.5000 mg | INHALATION_SOLUTION | RESPIRATORY_TRACT | Status: DC | PRN
  Administered 2018-03-08 – 2018-03-09 (×3): 2.5 mg via RESPIRATORY_TRACT
  Filled 2018-03-07 (×3): qty 3

## 2018-03-07 MED ORDER — ATORVASTATIN CALCIUM 40 MG PO TABS
40.0000 mg | ORAL_TABLET | Freq: Every day | ORAL | Status: DC
  Administered 2018-03-08 – 2018-03-10 (×3): 40 mg via ORAL
  Filled 2018-03-07 (×4): qty 1

## 2018-03-07 MED ORDER — POTASSIUM CHLORIDE 10 MEQ/100ML IV SOLN
10.0000 meq | Freq: Once | INTRAVENOUS | Status: AC
  Administered 2018-03-07: 10 meq via INTRAVENOUS

## 2018-03-07 MED ORDER — AZITHROMYCIN 250 MG PO TABS
500.0000 mg | ORAL_TABLET | Freq: Every day | ORAL | Status: AC
  Administered 2018-03-07 – 2018-03-11 (×5): 500 mg via ORAL
  Filled 2018-03-07 (×6): qty 2

## 2018-03-07 MED ORDER — MOMETASONE FURO-FORMOTEROL FUM 100-5 MCG/ACT IN AERO
2.0000 | INHALATION_SPRAY | Freq: Two times a day (BID) | RESPIRATORY_TRACT | Status: DC
  Administered 2018-03-07 – 2018-03-11 (×8): 2 via RESPIRATORY_TRACT
  Filled 2018-03-07: qty 8.8

## 2018-03-07 MED ORDER — GABAPENTIN 300 MG PO CAPS
300.0000 mg | ORAL_CAPSULE | Freq: Every day | ORAL | Status: DC
  Administered 2018-03-07 – 2018-03-10 (×4): 300 mg via ORAL
  Filled 2018-03-07 (×4): qty 1

## 2018-03-07 MED ORDER — OXYCODONE HCL 5 MG PO TABS
10.0000 mg | ORAL_TABLET | ORAL | Status: DC | PRN
  Administered 2018-03-07 – 2018-03-10 (×4): 10 mg via ORAL
  Filled 2018-03-07 (×4): qty 2

## 2018-03-07 MED ORDER — ONDANSETRON HCL 4 MG/2ML IJ SOLN
4.0000 mg | Freq: Four times a day (QID) | INTRAMUSCULAR | Status: DC | PRN
  Administered 2018-03-07 – 2018-03-10 (×5): 4 mg via INTRAVENOUS
  Filled 2018-03-07 (×5): qty 2

## 2018-03-07 MED ORDER — ACETAMINOPHEN 325 MG PO TABS
650.0000 mg | ORAL_TABLET | Freq: Four times a day (QID) | ORAL | Status: DC | PRN
  Administered 2018-03-07 – 2018-03-10 (×4): 650 mg via ORAL
  Filled 2018-03-07 (×4): qty 2

## 2018-03-07 NOTE — Plan of Care (Signed)
Female lives at home with her husband admitted with a fever of 103.7 the source of sepsis is unknown at this time she is on broad-spectrum antibiotics Vanco and cefepime.  She is afebrile this morning.  Cultures are pending.  Chest x-ray initial shows nothing acute.  She is getting IV hydration.  I have discussed face-to-face with 1 of the daughters and over the phone with a second daughter.

## 2018-03-07 NOTE — ED Notes (Addendum)
Savannah [name]- Daughter [phone]  Savannah [name]- Daughter [phone]  Call for questions or updates.

## 2018-03-07 NOTE — ED Notes (Signed)
Pt was transferred to a hospital bed.

## 2018-03-07 NOTE — Telephone Encounter (Signed)
Patient's daughter informed. She is currently in hospital. Daughter had to call 911 last night.

## 2018-03-07 NOTE — ED Notes (Signed)
Heat applied to hematoma to L hand. Also elevated

## 2018-03-07 NOTE — Progress Notes (Signed)
Pharmacy Antibiotic Note  Savannah [name] is a 63 y.o. female presented to the ED on 03/06/2018 with c/o fever and AMS.  To start  Broad abx with vancomycin and cefepime for sepsis.   Plan: - vancomycin 2000 mg IV x1 given in the ED at 2216 on 1/9. Vancomycin 1000 mg IV q24h for est AUC 516 - cefepime 2 gm IV x1 given in the ED at 2157 in 1/9. Cefepime 1gm IV q12h - monitor renal function closely  ___________________________________________  Temp (24hrs), Avg:101.6 F (38.7 C), Min:99.6 F (37.6 C), Max:103.1 F (39.5 C)  Recent Labs  Lab 03/06/18 1151 03/06/18 2143 03/06/18 2203  WBC 9.0  --  9.9  CREATININE 1.24*  --  1.50*  LATICACIDVEN  --  1.71  --     Estimated Creatinine Clearance: 49.4 mL/min (A) (by C-G formula based on SCr of 1.5 mg/dL (H)).    Allergies  Allergen Reactions  . Erythromycin Nausea And Vomiting     Thank you for allowing pharmacy to be a part of this patient's care.  Dia Sitter P 03/07/2018 1:17 AM

## 2018-03-07 NOTE — ED Notes (Signed)
ED TO INPATIENT HANDOFF REPORT  Name/Age/Gender [place] [name] 63 y.o. female  Code Status    Code Status Orders  (From admission, onward)         Start     Ordered   03/07/18 0111  Full code  Continuous     03/07/18 0114        Code Status History    Date Active Date Inactive Code Status Order ID Comments User Context   05/19/2014 1421 05/22/2014 1249 Full Code [id number]  Debbrah Alar, PA-C Inpatient      Home/SNF/Other Home  Chief Complaint fever  Level of Care/Admitting Diagnosis ED Disposition    ED Disposition Condition Comment   [facility] Area: [facility] [[id number]]  Level of Care: Telemetry [5]  Admit to tele based on following criteria: Complex arrhythmia (Bradycardia/Tachycardia)  Diagnosis: Sepsis [facility]) [[id number]]  Admitting Physician: Etta Quill [phone]  Attending Physician: Etta Quill [[id number]]  PT Class (Do Not Modify): Observation [104]  PT Acc Code (Do Not Modify): Observation [10022]       Medical History Past Medical History:  Diagnosis Date  . Allergic rhinitis   . Asthma   . B12 deficiency   . Depression   . Grade I internal hemorrhoids   . Hiatal hernia   . History of colon polyps   . History of kidney stones   . 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 Hypercholesterolemia   . Hypertension   . Hypothyroidism    endocrinologist -  dr Hale Bogus  . Iron deficiency anemia   . Lumbar spondylosis   . NAFLD (nonalcoholic fatty liver disease)    dx 2010--- last hepatic panel in epic dated 04-17-2017  . Nocturia   . OA (osteoarthritis of spine)    Lumbar  . OSA on CPAP pulmologist-  dr sood   sleep study 09-11-2004  very severe osa , AHI 119/hr,  setting 10  . Peripheral neuropathy    bottom of feet  . Renal calculus, right   . Type 2 diabetes mellitus  ([place])    endocrinoloigst-  dr Loanne Drilling,  last A1c 5.9 on 02-20-219 in epic  . Vitamin B 12 deficiency   . Wears glasses     Allergies Allergies  Allergen Reactions  . Erythromycin Nausea And Vomiting    IV Location/Drains/Wounds Patient Lines/Drains/Airways Status   Active Line/Drains/Airways    Name:   Placement date:   Placement time:   Site:   Days:   Peripheral IV 05/19/14 Left Hand   05/19/14    0733    Hand   1388   Peripheral IV 06/21/17 Right;Anterior Wrist   06/21/17    1240    Wrist   259   Peripheral IV 03/06/18 Right Hand   03/06/18    2018    Hand   1   Peripheral IV 03/06/18 Left Antecubital   03/06/18    2151    Antecubital   1   Ureteral Drain/Stent Right ureter 5 Fr.   06/21/17    1544    Right ureter   259   Incision (Closed) 05/19/14 Abdomen   05/19/14    1208     1388   Incision (Closed) 06/07/17 Perineum Right   06/07/17    1304     273  Incision - 6 Ports Abdomen 1: Umbilicus;Superior Right;Lateral;Upper 3: Right;Mid;Lateral 4: Right;Mid;Medial 5: Right;Lower;Lateral Right;Mid;Upper   05/19/14    0902     1388          Labs/Imaging Results for orders placed or performed during the hospital encounter of 03/06/18 (from the past 48 hour(s))  I-Stat CG4 Lactic Acid, ED     Status: None   Collection Time: 03/06/18  9:43 PM  Result Value Ref Range   Lactic Acid, Venous 1.71 0.5 - 1.9 mmol/L  Comprehensive metabolic panel     Status: Abnormal   Collection Time: 03/06/18 10:03 PM  Result Value Ref Range   Sodium 137 135 - 145 mmol/L   Potassium 2.3 (LL) 3.5 - 5.1 mmol/L    Comment: NO VISIBLE HEMOLYSIS CRITICAL RESULT CALLED TO, READ BACK BY AND VERIFIED WITH: WIOD A. RN AT 2345 03/06/18 CRUICKSHANK A.    Chloride 100 98 - 111 mmol/L   CO2 23 22 - 32 mmol/L   Glucose, Bld 200 (H) 70 - 99 mg/dL   BUN 25 (H) 8 - 23 mg/dL   Creatinine, Ser 1.50 (H) 0.44 - 1.00 mg/dL   Calcium 8.1 (L) 8.9 - 10.3 mg/dL   Total Protein 7.0 6.5 - 8.1 g/dL   Albumin 3.6 3.5 -  5.0 g/dL   AST 15 15 - 41 U/L   ALT 12 0 - 44 U/L   Alkaline Phosphatase 44 38 - 126 U/L   Total Bilirubin 2.4 (H) 0.3 - 1.2 mg/dL   GFR calc non Af Amer 37 (L) >60 mL/min   GFR calc Af Amer 43 (L) >60 mL/min   Anion gap 14 5 - 15    Comment: Performed at [facility], [place] [address] [place], [place] [address]  CBC WITH DIFFERENTIAL     Status: Abnormal   Collection Time: 03/06/18 10:03 PM  Result Value Ref Range   WBC 9.9 4.0 - 10.5 K/uL   RBC 3.64 (L) 3.87 - 5.11 MIL/uL   Hemoglobin 11.0 (L) 12.0 - 15.0 g/dL   HCT 32.3 (L) 36.0 - 46.0 %   MCV 88.7 80.0 - 100.0 fL   MCH 30.2 26.0 - 34.0 pg   MCHC 34.1 30.0 - 36.0 g/dL   RDW 13.5 11.5 - 15.5 %   Platelets 152 150 - 400 K/uL   nRBC 0.0 0.0 - 0.2 %   Neutrophils Relative % 85 %   Neutro Abs 8.4 (H) 1.7 - 7.7 K/uL   Lymphocytes Relative 4 %   Lymphs Abs 0.4 (L) 0.7 - 4.0 K/uL   Monocytes Relative 8 %   Monocytes Absolute 0.8 0.1 - 1.0 K/uL   Eosinophils Relative 2 %   Eosinophils Absolute 0.2 0.0 - 0.5 K/uL   Basophils Relative 0 %   Basophils Absolute 0.0 0.0 - 0.1 K/uL   Immature Granulocytes 1 %   Abs Immature Granulocytes 0.05 0.00 - 0.07 K/uL   Polychromasia PRESENT     Comment: Performed at [facility], [place] [address] [place], [place] [address]  Lipase, blood     Status: None   Collection Time: 03/06/18 10:03 PM  Result Value Ref Range   Lipase 29 11 - 51 U/L    Comment: Performed at [facility], [place] [address], [place], [place] [address]  Influenza panel by PCR (type A & B)     Status: None   Collection Time: 03/06/18 10:18 PM  Result Value Ref  Range   Influenza A By PCR NEGATIVE NEGATIVE   Influenza B By PCR NEGATIVE NEGATIVE    Comment: (NOTE) The Xpert Xpress Flu assay is intended as an aid in the diagnosis of  influenza and should not be used as a sole basis for treatment.  This  assay is FDA approved for nasopharyngeal swab specimens only. Nasal   washings and aspirates are unacceptable for Xpert Xpress Flu testing. Performed at [facility], [place] [name], [place], [place] [address]   Respiratory Panel by PCR     Status: Abnormal   Collection Time: 03/06/18 10:18 PM  Result Value Ref Range   Adenovirus NOT DETECTED NOT DETECTED   Coronavirus 229E NOT DETECTED NOT DETECTED   Coronavirus HKU1 NOT DETECTED NOT DETECTED   Coronavirus NL63 NOT DETECTED NOT DETECTED   Coronavirus OC43 NOT DETECTED NOT DETECTED   Metapneumovirus NOT DETECTED NOT DETECTED   Rhinovirus / Enterovirus NOT DETECTED NOT DETECTED   Influenza A NOT DETECTED NOT DETECTED   Influenza B NOT DETECTED NOT DETECTED   Parainfluenza Virus 1 DETECTED (A) NOT DETECTED   Parainfluenza Virus 2 NOT DETECTED NOT DETECTED   Parainfluenza Virus 3 NOT DETECTED NOT DETECTED   Parainfluenza Virus 4 NOT DETECTED NOT DETECTED   Respiratory Syncytial Virus NOT DETECTED NOT DETECTED   Bordetella pertussis NOT DETECTED NOT DETECTED   Chlamydophila pneumoniae NOT DETECTED NOT DETECTED   Mycoplasma pneumoniae NOT DETECTED NOT DETECTED    Comment: Performed at [facility], [place] [address], [place], [place] [address]  Urinalysis, Routine w reflex microscopic     Status: Abnormal   Collection Time: 03/06/18 10:57 PM  Result Value Ref Range   Color, Urine YELLOW YELLOW   APPearance CLEAR CLEAR   Specific Gravity, Urine 1.020 1.005 - 1.030   pH 5.0 5.0 - 8.0   Glucose, UA NEGATIVE NEGATIVE mg/dL   Hgb urine dipstick SMALL (A) NEGATIVE   Bilirubin Urine NEGATIVE NEGATIVE   Ketones, ur 20 (A) NEGATIVE mg/dL   Protein, ur 100 (A) NEGATIVE mg/dL   Nitrite NEGATIVE NEGATIVE   Leukocytes, UA NEGATIVE NEGATIVE   RBC / HPF 6-10 0 - 5 RBC/hpf   WBC, UA 0-5 0 - 5 WBC/hpf   Bacteria, UA NONE SEEN NONE SEEN   Mucus PRESENT     Comment: Performed at [facility], [place] [address], [place], [place] [address]  Procalcitonin - Baseline     Status:  None   Collection Time: 03/07/18  1:10 AM  Result Value Ref Range   Procalcitonin 3.91 ng/mL    Comment:        Interpretation: PCT > 2 ng/mL: Systemic infection (sepsis) is likely, unless other causes are known. (NOTE)       Sepsis PCT Algorithm           Lower Respiratory Tract                                      Infection PCT Algorithm    ----------------------------     ----------------------------         PCT < 0.25 ng/mL                PCT < 0.10 ng/mL         Strongly encourage             Strongly discourage  discontinuation of antibiotics    initiation of antibiotics    ----------------------------     -----------------------------       PCT 0.25 - 0.50 ng/mL            PCT 0.10 - 0.25 ng/mL               OR       >80% decrease in PCT            Discourage initiation of                                            antibiotics      Encourage discontinuation           of antibiotics    ----------------------------     -----------------------------         PCT >= 0.50 ng/mL              PCT 0.26 - 0.50 ng/mL               AND       <80% decrease in PCT              Encourage initiation of                                             antibiotics       Encourage continuation           of antibiotics    ----------------------------     -----------------------------        PCT >= 0.50 ng/mL                  PCT > 0.50 ng/mL               AND         increase in PCT                  Strongly encourage                                      initiation of antibiotics    Strongly encourage escalation           of antibiotics                                     -----------------------------                                           PCT <= 0.25 ng/mL                                                 OR                                        >  80% decrease in PCT                                     Discontinue / Do not initiate                                              antibiotics Performed at [place] [address] [place], [place] [address]   TSH     Status: None   Collection Time: 03/07/18  1:16 AM  Result Value Ref Range   TSH 2.494 0.350 - 4.500 uIU/mL    Comment: Performed by a 3rd Generation assay with a functional sensitivity of <=0.01 uIU/mL. Performed at [facility], [place] [address] [place], [place] [address]   Ammonia     Status: None   Collection Time: 03/07/18  1:16 AM  Result Value Ref Range   Ammonia 28 9 - 35 umol/L    Comment: Performed at [facility], [place] [address] [place], [place] [address]  I-Stat CG4 Lactic Acid, ED     Status: None   Collection Time: 03/07/18  2:23 AM  Result Value Ref Range   Lactic Acid, Venous 1.15 0.5 - 1.9 mmol/L  CBC     Status: Abnormal   Collection Time: 03/07/18  5:09 AM  Result Value Ref Range   WBC 10.4 4.0 - 10.5 K/uL   RBC 3.40 (L) 3.87 - 5.11 MIL/uL   Hemoglobin 10.3 (L) 12.0 - 15.0 g/dL   HCT 30.9 (L) 36.0 - 46.0 %   MCV 90.9 80.0 - 100.0 fL   MCH 30.3 26.0 - 34.0 pg   MCHC 33.3 30.0 - 36.0 g/dL   RDW 13.5 11.5 - 15.5 %   Platelets 131 (L) 150 - 400 K/uL   nRBC 0.0 0.0 - 0.2 %    Comment: Performed at [facility], [place] [address], [place], [place] [address]  Comprehensive metabolic panel     Status: Abnormal   Collection Time: 03/07/18  5:09 AM  Result Value Ref Range   Sodium 140 135 - 145 mmol/L   Potassium 3.2 (L) 3.5 - 5.1 mmol/L    Comment: DELTA CHECK NOTED   Chloride 105 98 - 111 mmol/L   CO2 24 22 - 32 mmol/L   Glucose, Bld 202 (H) 70 - 99 mg/dL   BUN 24 (H) 8 - 23 mg/dL   Creatinine, Ser 1.41 (H) 0.44 - 1.00 mg/dL   Calcium 7.9 (L) 8.9 - 10.3 mg/dL   Total Protein 6.8 6.5 - 8.1 g/dL   Albumin 3.4 (L) 3.5 - 5.0 g/dL   AST 14 (L) 15 - 41 U/L   ALT 13 0 - 44 U/L   Alkaline Phosphatase 42 38 - 126 U/L   Total Bilirubin 1.9 (H) 0.3 - 1.2 mg/dL   GFR calc non Af Amer 40 (L) >60 mL/min    GFR calc Af Amer 46 (L) >60 mL/min   Anion gap 11 5 - 15    Comment: Performed at [facility], [place] [address], [place], [place] [address]  CBG monitoring, ED     Status: Abnormal   Collection Time: 03/07/18  8:01 AM  Result Value Ref Range   Glucose-Capillary 175 (H) 70 -  99 mg/dL  CBG monitoring, ED     Status: Abnormal   Collection Time: 03/07/18 12:30 PM  Result Value Ref Range   Glucose-Capillary 118 (H) 70 - 99 mg/dL   Dg Chest 2 View  Result Date: 03/06/2018 CLINICAL DATA:  Fever and productive cough EXAM: CHEST - 2 VIEW COMPARISON:  04/16/2016 FINDINGS: The heart size and mediastinal contours are within normal limits. Both lungs are clear. The visualized skeletal structures are unremarkable. IMPRESSION: No active cardiopulmonary disease. Electronically Signed   By: Inez Catalina M.D.   On: 03/06/2018 15:13   Ct Head Wo Contrast  Result Date: 03/07/2018 CLINICAL DATA:  Fever. Altered mental status. EXAM: CT HEAD WITHOUT CONTRAST TECHNIQUE: Contiguous axial images were obtained from the base of the skull through the vertex without intravenous contrast. COMPARISON:  06/09/2009 FINDINGS: Brain: No evidence of acute infarction, hemorrhage, hydrocephalus, extra-axial collection or mass lesion/mass effect. Mild ventricular dilatation similar to previous study, likely early central atrophy. Vascular: No hyperdense vessel or unexpected calcification. Skull: Calvarium appears intact. Sinuses/Orbits: Prominent mucosal thickening throughout the paranasal sinuses. No acute air-fluid levels. Left mastoid effusions. 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 IMPRESSION: No acute intracranial abnormalities. Inflammatory changes in the paranasal sinuses. Left mastoid effusions. Electronically Signed   By: Lucienne Capers M.D.   On: 03/07/2018 00:54   Ct Abdomen Pelvis W Contrast  Result Date: 03/06/2018 CLINICAL DATA:  Fever and vomiting EXAM: CT ABDOMEN  AND PELVIS WITH CONTRAST TECHNIQUE: Multidetector CT imaging of the abdomen and pelvis was performed using the standard protocol following bolus administration of intravenous contrast. CONTRAST:  184mL ISOVUE-300 IOPAMIDOL (ISOVUE-300) INJECTION 61% COMPARISON:  05/17/2017 FINDINGS: Lower chest: Mild dependent atelectatic changes are noted. No focal infiltrate or sizable effusion is seen. Hepatobiliary: No focal liver abnormality is seen. No gallstones, gallbladder wall thickening, or biliary dilatation. Pancreas: Unremarkable. No pancreatic ductal dilatation or surrounding inflammatory changes. Spleen: Normal in size without focal abnormality. Adrenals/Urinary Tract: Adrenal glands are within normal limits. Left kidney is within normal limits. Right kidney demonstrates evidence of partial upper pole nephrectomy stable from the prior exam. Nonobstructing lower pole renal stones are noted on the right. The previously seen renal pelvic stones are no longer identified. The bladder is partially decompressed. No ureteral stones are seen. Stomach/Bowel: Scattered air and fecal material is noted throughout the colon. No obstructive or inflammatory changes are seen. The appendix is within normal limits. No small bowel abnormality is noted. The stomach is within normal limits. Vascular/Lymphatic: Aortic atherosclerosis. No enlarged abdominal or pelvic lymph nodes. Reproductive: Status post hysterectomy. No adnexal masses. Other: No abdominal wall hernia or abnormality. No abdominopelvic ascites. Musculoskeletal: Degenerative changes of lumbar spine are noted. IMPRESSION: Stable nonobstructing right renal stones. Previously seen right renal pelvic stones are no longer identified. Mild dependent atelectatic changes. Postoperative changes in the upper pole of the right kidney. No acute abnormality noted. Electronically Signed   By: Inez Catalina M.D.   On: 03/06/2018 21:47   Dg Chest Port 1 View  Result Date:  03/07/2018 CLINICAL DATA:  Sepsis. EXAM: PORTABLE CHEST 1 VIEW COMPARISON:  Radiograph March 06, 2018. FINDINGS: Stable cardiomegaly. No pneumothorax or pleural effusion is noted. Both lungs are clear. The visualized skeletal structures are unremarkable. IMPRESSION: No active disease. Electronically Signed   By: Marijo Conception, M.D.   On: 03/07/2018 07:34   Dg Chest Port 1 View  Result Date: 03/06/2018 CLINICAL DATA:  Fever.  Altered mental  status. EXAM: PORTABLE CHEST 1 VIEW COMPARISON:  03/06/2018 FINDINGS: Shallow inspiration. Normal heart size and pulmonary vascularity. No focal airspace disease or consolidation in the lungs. No blunting of costophrenic angles. No pneumothorax. Mediastinal contours appear intact. IMPRESSION: No active disease. Electronically Signed   By: Lucienne Capers M.D.   On: 03/06/2018 21:28    Pending Labs Unresulted Labs (From admission, onward)    Start     Ordered   03/08/18 0500  Procalcitonin  Daily,   R     03/07/18 0115   03/08/18 0500  Creatinine, serum  Daily,   R     03/07/18 0128   03/08/18 0500  CBC  Tomorrow morning,   R     03/07/18 1214   03/08/18 7564  Basic metabolic panel  Tomorrow morning,   R     03/07/18 1214   03/07/18 0110  HIV antibody (Routine Testing)  Once,   R     03/07/18 0114   03/06/18 2009  Blood Culture (routine x 2)  BLOOD CULTURE X 2,   STAT     03/06/18 2009   03/06/18 2009  Urine culture  ONCE - STAT,   STAT     03/06/18 2009          Vitals/Pain Today's Vitals   03/07/18 0918 03/07/18 1117 03/07/18 1222 03/07/18 1300  BP: 110/60   113/81  Pulse:  76 70 71  Resp:  (!) 24  (!) 30  Temp: 98.7 F (37.1 C)     TempSrc: Oral     SpO2:  97%  99%  PainSc:        Isolation Precautions Droplet precaution  Medications Medications  potassium chloride 10 mEq in 100 mL IVPB (0 mEq Intravenous Stopped 03/07/18 0801)  atorvastatin (LIPITOR) tablet 40 mg (has no administration in time range)  albuterol (PROVENTIL) (2.5  MG/3ML) 0.083% nebulizer solution 2.5 mg (has no administration in time range)  gabapentin (NEURONTIN) capsule 300 mg (300 mg Oral Not Given 03/07/18 0547)  mometasone-formoterol (DULERA) 100-5 MCG/ACT inhaler 2 puff (2 puffs Inhalation Given 03/07/18 1223)  metoprolol succinate (TOPROL-XL) 24 hr tablet 12.5 mg (12.5 mg Oral Given 03/07/18 1222)  Lifitegrast 5 % SOLN 1 drop (1 drop Both Eyes Not Given 03/07/18 0547)  traZODone (DESYREL) tablet 100 mg (100 mg Oral Not Given 03/07/18 0549)  levothyroxine (SYNTHROID, LEVOTHROID) tablet 137 mcg (137 mcg Oral Given 03/07/18 1222)  citalopram (CELEXA) tablet 20 mg (20 mg Oral Given 03/07/18 1222)  ferrous sulfate tablet 325 mg (has no administration in time range)  insulin aspart (novoLOG) injection 0-15 Units (0 Units Subcutaneous Hold 03/07/18 1342)  acetaminophen (TYLENOL) tablet 650 mg (has no administration in time range)    Or  acetaminophen (TYLENOL) suppository 650 mg (has no administration in time range)  ondansetron (ZOFRAN) tablet 4 mg ( Oral See Alternative 03/07/18 1338)    Or  ondansetron (ZOFRAN) injection 4 mg (4 mg Intravenous Given 03/07/18 1338)  0.9 %  sodium chloride infusion ( Intravenous New Bag/Given 03/07/18 1233)  enoxaparin (LOVENOX) injection 55 mg (55 mg Subcutaneous Given 03/07/18 1223)  cefTRIAXone (ROCEPHIN) 1 g in sodium chloride 0.9 % 100 mL IVPB (has no administration in time range)  azithromycin (ZITHROMAX) tablet 500 mg (has no administration in time range)  acetaminophen (TYLENOL) tablet 1,000 mg ( Oral See Alternative 03/06/18 2027)    Or  acetaminophen (TYLENOL) suppository 650 mg (650 mg Rectal Given 03/06/18 2027)  sodium chloride 0.9 %  bolus 1,000 mL (0 mLs Intravenous Stopped 03/06/18 2130)  LORazepam (ATIVAN) injection 0.5 mg (0.5 mg Intravenous Given 03/06/18 2028)  iopamidol (ISOVUE-300) 61 % injection 100 mL (100 mLs Intravenous Contrast Given 03/06/18 2054)  sodium chloride (PF) 0.9 % injection (  Given by Other 03/07/18  0550)  ceFEPIme (MAXIPIME) 2 g in sodium chloride 0.9 % 100 mL IVPB (0 g Intravenous Stopped 03/06/18 2227)  vancomycin (VANCOCIN) 2,000 mg in sodium chloride 0.9 % 500 mL IVPB (0 mg Intravenous Stopped 03/07/18 0045)  LORazepam (ATIVAN) injection 0.5 mg (0.5 mg Intravenous Given 03/06/18 2057)  potassium chloride 10 mEq in 100 mL IVPB (0 mEq Intravenous Stopped 03/07/18 0939)    Mobility walks with person assist

## 2018-03-07 NOTE — ED Notes (Signed)
Large Hematoma with edema noted to L hand.

## 2018-03-07 NOTE — H&P (Signed)
History and Physical    Savannah [name]:[id number] DOB: 03/08/1955 DOA: 03/06/2018  PCP: Biagio Borg, MD  Patient coming from: Home  I have personally briefly reviewed patient's old medical records in [place]  Chief Complaint: AMS  HPI: Savannah [name] is a 63 y.o. female with medical history significant of DM2, HTN.  Patient presents to ED for fever and AMS.  Patient had URI symptoms onset yesterday.  Today had fever and so went to PCP.  Told she had URI and started on amoxicillin.  Patient initially refused to come to ER.  At home she became increasingly altered and so patient brought to ER.   ED Course: Initially quite lethargic Fever 103.x.  HR 150.  CXR neg, UA neg, CT abd/pelvis neg, CT head neg.  Given tylenol, cefepime, vanc, IVF.  Fever resolved, tachycardia resolved now NSR at 70.  Mental status improved though not back to baseline, still sleepy.   Review of Systems: Unable to perform, still altered.  Past Medical History:  Diagnosis Date  . Allergic rhinitis   . Asthma   . B12 deficiency   . Depression   . Grade I internal hemorrhoids   . Hiatal hernia   . History of colon polyps   . History of kidney stones   . 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 Hypercholesterolemia   . Hypertension   . Hypothyroidism    endocrinologist -  dr Hale Bogus  . Iron deficiency anemia   . Lumbar spondylosis   . NAFLD (nonalcoholic fatty liver disease)    dx 2010--- last hepatic panel in epic dated 04-17-2017  . Nocturia   . OA (osteoarthritis of spine)    Lumbar  . OSA on CPAP pulmologist-  dr sood   sleep study 09-11-2004  very severe osa , AHI 119/hr,  setting 10  . Peripheral neuropathy    bottom of feet  . Renal calculus, right   . Type 2 diabetes mellitus ([place])    endocrinoloigst-  dr Loanne Drilling,  last  A1c 5.9 on 02-20-219 in epic  . Vitamin B 12 deficiency   . Wears glasses     Past Surgical History:  Procedure Laterality Date  . BUNIONECTOMY/  HAMMERTOE CORRECTION'S  09-07-2013   SCG   RIGHT FOOT 2ND, 3RD, 4TH, 5TH DIGITS  . CARDIAC CATHETERIZATION  06-01-2009  dr Angelena Form   no evidence CAD,  normal LVSF, elevated blood pressure w/ elevated end-diastolic pressure, ef 88-91%  . CARDIOVASCULAR STRESS TEST  05-11-2009   dr Angelena Form   abnormal lexiscan nuclear study (no exercise) w/ mild ischemia in the distal anteroseptal wall and apex, this defect may be due to shifting breast attenuation  . COLONOSCOPY WITH SNARE POLYPECTOMY WITH ESOPHAGOGASTRODUODENOSCOPY  02/08/2014   with biopsy Dr. Jerilynn Mages. [facility] Plan  . CYSTOSCOPY WITH RETROGRADE PYELOGRAM, URETEROSCOPY AND STENT PLACEMENT Right 06/07/2017   Procedure: CYSTOSCOPY WITH RETROGRADE PYELOGRAM, URETEROSCOPY, LITHOTRIPSY,  AND STENT PLACEMENT 1st STAGE;  Surgeon: Alexis Frock, MD;  Location: [facility];  Service: Urology;  Laterality: Right;  . CYSTOSCOPY WITH RETROGRADE PYELOGRAM, URETEROSCOPY AND STENT PLACEMENT Right 06/21/2017   Procedure: CYSTOSCOPY WITH RETROGRADE PYELOGRAM, URETEROSCOPY AND STENT PLACEMENT 2ND STAGE  STENT" EXCHANGE";  Surgeon: Alexis Frock, MD;  Location: [facility];  Service: Urology;  Laterality: Right;  . FRACTURE SURGERY  Right    Compound fracture foot  . HOLMIUM LASER APPLICATION Right 06/29/5463   Procedure: HOLMIUM LASER APPLICATION;  Surgeon: Alexis Frock, MD;  Location: [facility];  Service: Urology;  Laterality: Right;  . HOLMIUM LASER APPLICATION Right 6/81/2751   Procedure: HOLMIUM LASER APPLICATION;  Surgeon: Alexis Frock, MD;  Location: [facility];  Service: Urology;  Laterality: Right;  . KNEE ARTHROSCOPY Left 1993  . ROBOTIC ASSITED PARTIAL NEPHRECTOMY Right 05/19/2014   Procedure: ROBOTIC ASSITED PARTIAL NEPHRECTOMY;  Surgeon: Alexis Frock, MD;  Location: [facility];  Service: Urology;  Laterality: Right;     reports that she quit smoking about 43 years ago. Her smoking use included cigarettes. She has a 0.60 pack-year smoking history. She has never used smokeless tobacco. She reports that she does not drink alcohol or use drugs.  Allergies  Allergen Reactions  . Erythromycin Nausea And Vomiting    Family History  Problem Relation Age of Onset  . Colon cancer Mother 43  . Diabetes Mother   . Kidney disease Mother   . Heart attack Father   . Heart disease Father   . Allergies Sister   . Asthma Sister   . Clotting disorder Grandchild   . Heart disease Paternal Grandmother   . Esophageal cancer Neg Hx   . Rectal cancer Neg Hx   . Stomach cancer Neg Hx      Prior to Admission medications   Medication Sig Start Date End Date Taking? Authorizing Provider  amoxicillin-clavulanate (AUGMENTIN) 875-125 MG tablet Take 1 tablet by mouth 2 (two) times daily. 03/06/18  Yes Lance Sell, NP  atorvastatin (LIPITOR) 40 MG tablet Take 1 tablet (40 mg total) by mouth daily. 12/02/17  Yes Biagio Borg, MD  cholecalciferol (VITAMIN D) 1000 units tablet Take 1,000 Units by mouth 2 (two) times daily.   Yes [provider]  citalopram (CELEXA) 20 MG tablet TAKE 1 TABLET BY MOUTH EVERY DAY--- takes in am 12/02/17  Yes Biagio Borg, MD  Cyanocobalamin (B-12 COMPLIANCE INJECTION IJ) Inject as directed every 30 (thirty) days.   Yes [provider]  ferrous sulfate 325 (65 FE) MG tablet Take 325 mg by mouth daily with breakfast.   Yes [provider]  Fluticasone-Salmeterol (ADVAIR) 100-50 MCG/DOSE AEPB Inhale 1 puff into the lungs 2 (two) times daily. 12/02/17  Yes Biagio Borg, MD  gabapentin (NEURONTIN) 300 MG capsule TAKE 1 CAPSULE BY MOUTH EVERYDAY AT BEDTIME 12/02/17  Yes Biagio Borg, MD  levothyroxine (SYNTHROID, LEVOTHROID) 137 MCG tablet Take 1 tablet (137 mcg total) by mouth daily before breakfast.  12/02/17  Yes Biagio Borg, MD  losartan-hydrochlorothiazide (HYZAAR) 100-12.5 MG tablet Take 1 tablet by mouth daily. 12/02/17  Yes Biagio Borg, MD  metFORMIN (GLUCOPHAGE-XR) 500 MG 24 hr tablet Take 2 tablets (1,000 mg total) by mouth 2 (two) times daily. TAKE 2 TABLETS BY MOUTH TWICE A DAY 12/02/17  Yes Biagio Borg, MD  metoprolol succinate (TOPROL-XL) 25 MG 24 hr tablet Take 0.5 tablets (12.5 mg total) by mouth daily. 12/02/17  Yes Biagio Borg, MD  ondansetron (ZOFRAN-ODT) 4 MG disintegrating tablet Take 1 tablet (4 mg total) by mouth every 8 (eight) hours as needed for nausea or vomiting. 03/06/18  Yes Lance Sell, NP  pioglitazone (ACTOS) 15 MG tablet Take 1 tablet (15 mg total) by mouth daily. 12/02/17  Yes Biagio Borg, MD  traZODone (DESYREL) 100 MG tablet TAKE  1 TABLET BY MOUTH EVERYDAY AT BEDTIME 12/02/17  Yes Biagio Borg, MD  XIIDRA 5 % SOLN Place 1 drop into both eyes 2 (two) times daily. 01/03/18  Yes [provider]  albuterol (PROAIR HFA) 108 (90 Base) MCG/ACT inhaler Inhale 2 puffs into the lungs every 4 (four) hours as needed for wheezing or shortness of breath (cough). 12/02/17   Biagio Borg, MD  glucose blood [facility] VERIO) test strip Use to check blood sugar 1 time per day. 10/19/15   Renato Shin, MD  hyoscyamine (LEVSIN SL) 0.125 MG SL tablet Take 1-2 tablets by mouth or under tongue every 4 hours as needed. Patient taking differently: Take 0.125-0.25 mg by mouth every 4 (four) hours as needed for cramping (stomach spasms).  01/14/15   Renato Shin, MD  [facility] DELICA LANCETS FINE MISC Use to check blood sugar 1 time per day. 10/19/15   Renato Shin, MD  Oxycodone HCl 10 MG TABS Take 10 mg by mouth daily as needed (pain).  12/13/17   [provider]    Physical Exam: Vitals:   03/06/18 2300 03/06/18 2346 03/07/18 0000 03/07/18 0100  BP: 131/62 (!) 131/55 (!) 129/57 (!) 113/45  Pulse:  73 72 69  Resp: 17 16 20  (!) 27  Temp:      TempSrc:       SpO2:  100% 100% 97%    Constitutional: NAD, calm, comfortable Eyes: PERRL, lids and conjunctivae normal ENMT: Mucous membranes are moist. Posterior pharynx clear of any exudate or lesions.Normal dentition.  Neck: normal, supple, no masses, no thyromegaly Respiratory: clear to auscultation bilaterally, no wheezing, no crackles. Normal respiratory effort. No accessory muscle use.  Cardiovascular: Regular rate and rhythm, no murmurs / rubs / gallops. No extremity edema. 2+ pedal pulses. No carotid bruits.  Abdomen: no tenderness, no masses palpated. No hepatosplenomegaly. Bowel sounds positive.  Musculoskeletal: no clubbing / cyanosis. No joint deformity upper and lower extremities. Good ROM, no contractures. Normal muscle tone.  Skin: no rashes, lesions, ulcers. No induration Neurologic: Somnolent, Opens eyes to voice, acknowledges me, falls back asleep quickly.   Labs on Admission: I have personally reviewed following labs and imaging studies  CBC: Recent Labs  Lab 03/06/18 1151 03/06/18 2203  WBC 9.0 9.9  NEUTROABS  --  8.4*  HGB 12.6 11.0*  HCT 35.9* 32.3*  MCV 87.2 88.7  PLT 185.0 099   Basic Metabolic Panel: Recent Labs  Lab 03/06/18 1151 03/06/18 2203  NA 135 137  K 3.2* 2.3*  CL 96 100  CO2 27 23  GLUCOSE 146* 200*  BUN 19 25*  CREATININE 1.24* 1.50*  CALCIUM 9.0 8.1*   GFR: Estimated Creatinine Clearance: 49.4 mL/min (A) (by C-G formula based on SCr of 1.5 mg/dL (H)). Liver Function Tests: Recent Labs  Lab 03/06/18 2203  AST 15  ALT 12  ALKPHOS 44  BILITOT 2.4*  PROT 7.0  ALBUMIN 3.6   Recent Labs  Lab 03/06/18 2203  LIPASE 29   No results for input(s): AMMONIA in the last 168 hours. Coagulation Profile: No results for input(s): INR, PROTIME in the last 168 hours. Cardiac Enzymes: No results for input(s): CKTOTAL, CKMB, CKMBINDEX, TROPONINI in the last 168 hours. BNP (last 3 results) No results for input(s): PROBNP in the last 8760  hours. HbA1C: No results for input(s): HGBA1C in the last 72 hours. CBG: No results for input(s): GLUCAP in the last 168 hours. Lipid Profile: No results for input(s): CHOL, HDL, LDLCALC,  TRIG, CHOLHDL, LDLDIRECT in the last 72 hours. Thyroid Function Tests: No results for input(s): TSH, T4TOTAL, FREET4, T3FREE, THYROIDAB in the last 72 hours. Anemia Panel: No results for input(s): VITAMINB12, FOLATE, FERRITIN, TIBC, IRON, RETICCTPCT in the last 72 hours. Urine analysis:    Component Value Date/Time   COLORURINE YELLOW 03/06/2018 2257   APPEARANCEUR CLEAR 03/06/2018 2257   LABSPEC 1.020 03/06/2018 2257   PHURINE 5.0 03/06/2018 2257   GLUCOSEU NEGATIVE 03/06/2018 2257   GLUCOSEU NEGATIVE 12/02/2017 1200   HGBUR SMALL (A) 03/06/2018 2257   HGBUR moderate 03/07/2010 1511   BILIRUBINUR NEGATIVE 03/06/2018 2257   BILIRUBINUR neg 11/02/2011 0846   KETONESUR 20 (A) 03/06/2018 2257   PROTEINUR 100 (A) 03/06/2018 2257   UROBILINOGEN 0.2 12/02/2017 1200   NITRITE NEGATIVE 03/06/2018 2257   LEUKOCYTESUR NEGATIVE 03/06/2018 2257    Radiological Exams on Admission: Dg Chest 2 View  Result Date: 03/06/2018 CLINICAL DATA:  Fever and productive cough EXAM: CHEST - 2 VIEW COMPARISON:  04/16/2016 FINDINGS: The heart size and mediastinal contours are within normal limits. Both lungs are clear. The visualized skeletal structures are unremarkable. IMPRESSION: No active cardiopulmonary disease. Electronically Signed   By: Inez Catalina M.D.   On: 03/06/2018 15:13   Ct Head Wo Contrast  Result Date: 03/07/2018 CLINICAL DATA:  Fever. Altered mental status. EXAM: CT HEAD WITHOUT CONTRAST TECHNIQUE: Contiguous axial images were obtained from the base of the skull through the vertex without intravenous contrast. COMPARISON:  06/09/2009 FINDINGS: Brain: No evidence of acute infarction, hemorrhage, hydrocephalus, extra-axial collection or mass lesion/mass effect. Mild ventricular dilatation similar to  previous study, likely early central atrophy. Vascular: No hyperdense vessel or unexpected calcification. Skull: Calvarium appears intact. Sinuses/Orbits: Prominent mucosal thickening throughout the paranasal sinuses. No acute air-fluid levels. Left mastoid effusions. 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 IMPRESSION: No acute intracranial abnormalities. Inflammatory changes in the paranasal sinuses. Left mastoid effusions. Electronically Signed   By: Lucienne Capers M.D.   On: 03/07/2018 00:54   Ct Abdomen Pelvis W Contrast  Result Date: 03/06/2018 CLINICAL DATA:  Fever and vomiting EXAM: CT ABDOMEN AND PELVIS WITH CONTRAST TECHNIQUE: Multidetector CT imaging of the abdomen and pelvis was performed using the standard protocol following bolus administration of intravenous contrast. CONTRAST:  149mL ISOVUE-300 IOPAMIDOL (ISOVUE-300) INJECTION 61% COMPARISON:  05/17/2017 FINDINGS: Lower chest: Mild dependent atelectatic changes are noted. No focal infiltrate or sizable effusion is seen. Hepatobiliary: No focal liver abnormality is seen. No gallstones, gallbladder wall thickening, or biliary dilatation. Pancreas: Unremarkable. No pancreatic ductal dilatation or surrounding inflammatory changes. Spleen: Normal in size without focal abnormality. Adrenals/Urinary Tract: Adrenal glands are within normal limits. Left kidney is within normal limits. Right kidney demonstrates evidence of partial upper pole nephrectomy stable from the prior exam. Nonobstructing lower pole renal stones are noted on the right. The previously seen renal pelvic stones are no longer identified. The bladder is partially decompressed. No ureteral stones are seen. Stomach/Bowel: Scattered air and fecal material is noted throughout the colon. No obstructive or inflammatory changes are seen. The appendix is within normal limits. No small bowel abnormality is noted. The stomach is within normal limits.  Vascular/Lymphatic: Aortic atherosclerosis. No enlarged abdominal or pelvic lymph nodes. Reproductive: Status post hysterectomy. No adnexal masses. Other: No abdominal wall hernia or abnormality. No abdominopelvic ascites. Musculoskeletal: Degenerative changes of lumbar spine are noted. IMPRESSION: Stable nonobstructing right renal stones. Previously seen right renal pelvic stones are no longer identified.  Mild dependent atelectatic changes. Postoperative changes in the upper pole of the right kidney. No acute abnormality noted. Electronically Signed   By: Inez Catalina M.D.   On: 03/06/2018 21:47   Dg Chest Port 1 View  Result Date: 03/06/2018 CLINICAL DATA:  Fever.  Altered mental status. EXAM: PORTABLE CHEST 1 VIEW COMPARISON:  03/06/2018 FINDINGS: Shallow inspiration. Normal heart size and pulmonary vascularity. No focal airspace disease or consolidation in the lungs. No blunting of costophrenic angles. No pneumothorax. Mediastinal contours appear intact. IMPRESSION: No active disease. Electronically Signed   By: Lucienne Capers M.D.   On: 03/06/2018 21:28    EKG: Independently reviewed.  Assessment/Plan Principal Problem:   Sepsis ([place]) Active Problems:   Diabetes ([place])   Essential hypertension   Hypokalemia   Acute metabolic encephalopathy    1. Sepsis - 1. Presumed respiratory source 2. Continue empiric cefepime / vanc for now 3. pro calcitonin 4. resp culture, BCx 5. RVP pending 6. Influenza neg 7. IVF: NS at 125 cc/hr, got 1L bolus 8. Tylenol PRN fever 9. Tele monitor due to initial tachycardia to 150, though think this was more temperature related as she is now NSR 70 10. Repeat CXR in AM to see if PNA becomes visible 2. HTN - 1. Holding home BP meds in setting of sepsis 3. DM2 - 1. Holding home hypoglycemics 2. SSI mod scale AC 4. Acute metabolic encephalopathy - 1. Delirium due to fever 2. Improved with improvement in fever though not yet back to  baseline. 5. Hypokalemia - 1. Replace 2. Repeat BMP in AM  DVT prophylaxis: Lovenox Code Status: Full Family Communication: Family at bedside Disposition Plan: Home after admit Consults called: None Admission status: Place in [place] - likely convert to [place], [place] [facility] Pager [phone] Only works nights!  If 7AM-7PM, please contact the primary day team physician taking care of patient  [email or web address] Password [email or web address]  03/07/2018, 1:45 AM

## 2018-03-08 DIAGNOSIS — J204 Acute bronchitis due to parainfluenza virus: Secondary | ICD-10-CM

## 2018-03-08 DIAGNOSIS — A419 Sepsis, unspecified organism: Secondary | ICD-10-CM | POA: Diagnosis not present

## 2018-03-08 DIAGNOSIS — E876 Hypokalemia: Secondary | ICD-10-CM | POA: Diagnosis not present

## 2018-03-08 DIAGNOSIS — E119 Type 2 diabetes mellitus without complications: Secondary | ICD-10-CM

## 2018-03-08 DIAGNOSIS — G9341 Metabolic encephalopathy: Secondary | ICD-10-CM | POA: Diagnosis not present

## 2018-03-08 LAB — URINE CULTURE: Culture: NO GROWTH

## 2018-03-08 LAB — BASIC METABOLIC PANEL
Anion gap: 11 (ref 5–15)
BUN: 29 mg/dL — ABNORMAL HIGH (ref 8–23)
CO2: 24 mmol/L (ref 22–32)
Calcium: 7.4 mg/dL — ABNORMAL LOW (ref 8.9–10.3)
Chloride: 106 mmol/L (ref 98–111)
Creatinine, Ser: 1.48 mg/dL — ABNORMAL HIGH (ref 0.44–1.00)
GFR calc Af Amer: 43 mL/min — ABNORMAL LOW (ref >60)
GFR calc non Af Amer: 37 mL/min — ABNORMAL LOW (ref >60)
Glucose, Bld: 97 mg/dL (ref 70–99)
Potassium: 3.4 mmol/L — ABNORMAL LOW (ref 3.5–5.1)
Sodium: 141 mmol/L (ref 135–145)

## 2018-03-08 LAB — GLUCOSE, CAPILLARY
Glucose-Capillary: 105 mg/dL — ABNORMAL HIGH (ref 70–99)
Glucose-Capillary: 111 mg/dL — ABNORMAL HIGH (ref 70–99)
Glucose-Capillary: 89 mg/dL (ref 70–99)
Glucose-Capillary: 163 mg/dL — ABNORMAL HIGH (ref 70–99)

## 2018-03-08 LAB — CBC
HCT: 31.3 % — ABNORMAL LOW (ref 36.0–46.0)
HEMOGLOBIN: 9.9 g/dL — AB (ref 12.0–15.0)
MCH: 29.8 pg (ref 26.0–34.0)
MCHC: 31.6 g/dL (ref 30.0–36.0)
MCV: 94.3 fL (ref 80.0–100.0)
Platelets: 173 10*3/uL (ref 150–400)
RBC: 3.32 MIL/uL — ABNORMAL LOW (ref 3.87–5.11)
RDW: 13.5 % (ref 11.5–15.5)
WBC: 9.3 10*3/uL (ref 4.0–10.5)
nRBC: 0 % (ref 0.0–0.2)

## 2018-03-08 LAB — PROCALCITONIN: PROCALCITONIN: 5.69 ng/mL

## 2018-03-08 MED ORDER — TRAZODONE HCL 50 MG PO TABS
50.0000 mg | ORAL_TABLET | Freq: Every evening | ORAL | Status: DC | PRN
  Administered 2018-03-09: 50 mg via ORAL
  Filled 2018-03-08 (×2): qty 1

## 2018-03-08 MED ORDER — POTASSIUM CHLORIDE CRYS ER 20 MEQ PO TBCR
40.0000 meq | EXTENDED_RELEASE_TABLET | Freq: Once | ORAL | Status: AC
  Administered 2018-03-08: 40 meq via ORAL
  Filled 2018-03-08: qty 2

## 2018-03-08 MED ORDER — METHYLPREDNISOLONE SODIUM SUCC 125 MG IJ SOLR
60.0000 mg | Freq: Two times a day (BID) | INTRAMUSCULAR | Status: DC
  Administered 2018-03-08 – 2018-03-10 (×4): 60 mg via INTRAVENOUS
  Filled 2018-03-08 (×4): qty 2

## 2018-03-08 NOTE — Progress Notes (Signed)
Pt has declined use of CPAP tonight. Pt does not like our nasal mask and pt's husband left her nasal pillows at home.  Machine remains at bedside, RT to monitor and assess.

## 2018-03-08 NOTE — Progress Notes (Signed)
[facility] PROGRESS NOTE  Savannah [name] IEP:[id number] DOB: 1955/05/27 DOA: 03/06/2018  PCP: Biagio Borg, MD  Brief History/Interval Summary: 63 y.o. female with medical history significant of DM2, HTN.  She presented to the emergency department with fever and altered mental status.  Recently diagnosed with upper respiratory tract infection and was started on amoxicillin.  Patient was noted to have a fever of 103, she was tachycardic, no clear source of infection.  She was hospitalized for further management.  Reason for Visit: Sepsis  Consultants: None  Procedures: None  Antibiotics: Was given vancomycin and cefepime initially. Currently on ceftriaxone and azithromycin.  Subjective/Interval History: Patient complains of feeling very poorly.  Very fatigued.  Short of breath with wheezing.  Has a cough with clear expectoration.  Complains of ear pain body aches neck pain.  ROS: Nausea but no vomiting.  Objective:  Vital Signs  Vitals:   03/07/18 2026 03/07/18 2050 03/07/18 2145 03/08/18 0421  BP:  101/88  117/60  Pulse:  79  70  Resp:    13  Temp:  (!) 101 F (38.3 C) 99.2 F (37.3 C) 98.4 F (36.9 C)  TempSrc:  Oral Oral Oral  SpO2: 94% 93%  94%  Weight:      Height:        Intake/Output Summary (Last 24 hours) at 03/08/2018 1410 Last data filed at 03/08/2018 0601 Gross per 24 hour  Intake 1827.13 ml  Output -  Net 1827.13 ml   Filed Weights   03/07/18 1521  Weight: 106 kg    General appearance: alert, cooperative, appears stated age and no distress Head: Normocephalic, without obvious abnormality, atraumatic Right ear showed dull tympanic membrane.  Left ear also showed the same.  Cerumen is noted.  No obvious drainage noted. Resp: Tachypneic at rest.  No use of accessory muscles.  Coarse breath sounds with diffuse wheezing bilaterally.  Crackles at the bases. Cardio: regular rate and rhythm, S1, S2 normal, no murmur, click, rub or  gallop GI: soft, non-tender; bowel sounds normal; no masses,  no organomegaly Extremities: extremities normal, atraumatic, no cyanosis or edema Pulses: 2+ and symmetric Neurologic: Awake alert.  No focal neurological deficits.  Lab Results:  Data Reviewed: I have personally reviewed following labs and imaging studies  CBC: Recent Labs  Lab 03/06/18 1151 03/06/18 2203 03/07/18 0509 03/08/18 0543  WBC 9.0 9.9 10.4 9.3  NEUTROABS  --  8.4*  --   --   HGB 12.6 11.0* 10.3* 9.9*  HCT 35.9* 32.3* 30.9* 31.3*  MCV 87.2 88.7 90.9 94.3  PLT 185.0 152 131* 660    Basic Metabolic Panel: Recent Labs  Lab 03/06/18 1151 03/06/18 2203 03/07/18 0509 03/08/18 0543  NA 135 137 140 141  K 3.2* 2.3* 3.2* 3.4*  CL 96 100 105 106  CO2 27 23 24 24   GLUCOSE 146* 200* 202* 97  BUN 19 25* 24* 29*  CREATININE 1.24* 1.50* 1.41* 1.48*  CALCIUM 9.0 8.1* 7.9* 7.4*    GFR: Estimated Creatinine Clearance: 49.6 mL/min (A) (by C-G formula based on SCr of 1.48 mg/dL (H)).  Liver Function Tests: Recent Labs  Lab 03/06/18 2203 03/07/18 0509  AST 15 14*  ALT 12 13  ALKPHOS 44 42  BILITOT 2.4* 1.9*  PROT 7.0 6.8  ALBUMIN 3.6 3.4*    Recent Labs  Lab 03/06/18 2203  LIPASE 29   Recent Labs  Lab 03/07/18 0116  AMMONIA 28    CBG: Recent  Labs  Lab 03/07/18 1230 03/07/18 1713 03/07/18 2051 03/08/18 0734 03/08/18 1128  GLUCAP 118* 125* 93 105* 111*   Thyroid Function Tests: Recent Labs    03/07/18 0116  TSH 2.494     Recent Results (from the past 240 hour(s))  Blood Culture (routine x 2)     Status: None (Preliminary result)   Collection Time: 03/06/18 10:06 PM  Result Value Ref Range Status   Specimen Description   Final    BLOOD RIGHT ANTECUBITAL Performed at [facility], [place] [address], [place], [place] [address]    Special Requests   Final    BOTTLES DRAWN AEROBIC AND ANAEROBIC Blood Culture adequate volume Performed at [place] [address] [place], [place] [address]    Culture   Final    NO GROWTH 1 DAY Performed at [facility], [place] [address], [place], [place] [address]    Report Status PENDING  Incomplete  Blood Culture (routine x 2)     Status: None (Preliminary result)   Collection Time: 03/06/18 10:06 PM  Result Value Ref Range Status   Specimen Description   Final    BLOOD LEFT ANTECUBITAL Performed at [place] [address] [place], [place] [address]    Special Requests   Final    BOTTLES DRAWN AEROBIC AND ANAEROBIC BACTEROIDES CACCAE Performed at [facility], [place] [address], [place], [place] [address]    Culture   Final    NO GROWTH 1 DAY Performed at [facility], [place] [address] [place], [place] [address]    Report Status PENDING  Incomplete  Respiratory Panel by PCR     Status: Abnormal   Collection Time: 03/06/18 10:18 PM  Result Value Ref Range Status   Adenovirus NOT DETECTED NOT DETECTED Final   Coronavirus 229E NOT DETECTED NOT DETECTED Final   Coronavirus HKU1 NOT DETECTED NOT DETECTED Final   Coronavirus NL63 NOT DETECTED NOT DETECTED Final   Coronavirus OC43 NOT DETECTED NOT DETECTED Final   Metapneumovirus NOT DETECTED NOT DETECTED Final   Rhinovirus / Enterovirus NOT DETECTED NOT DETECTED Final   Influenza A NOT DETECTED NOT DETECTED Final   Influenza B NOT DETECTED NOT DETECTED Final   Parainfluenza Virus 1 DETECTED (A) NOT DETECTED Final   Parainfluenza Virus 2 NOT DETECTED NOT DETECTED Final   Parainfluenza Virus 3 NOT DETECTED NOT DETECTED Final   Parainfluenza Virus 4 NOT DETECTED NOT DETECTED Final   Respiratory Syncytial Virus NOT DETECTED NOT DETECTED Final   Bordetella pertussis NOT DETECTED NOT DETECTED Final   Chlamydophila pneumoniae NOT DETECTED NOT DETECTED Final   Mycoplasma pneumoniae NOT DETECTED NOT DETECTED Final    Comment: Performed at [facility], [place] [address]  [place], [place] [address]  Urine culture     Status: None   Collection Time: 03/06/18 10:57 PM  Result Value Ref Range Status   Specimen Description URINE, CLEAN CATCH  Final   Special Requests   Final    NONE Performed at [place] [address] [place], [place] [address]    Culture NO GROWTH  Final   Report Status 03/08/2018 FINAL  Final      Radiology Studies: Ct Head Wo Contrast  Result Date: 03/07/2018 CLINICAL DATA:  Fever. Altered mental status. EXAM: CT HEAD WITHOUT CONTRAST TECHNIQUE: Contiguous axial images were obtained from the base of the skull through the vertex without  intravenous contrast. COMPARISON:  06/09/2009 FINDINGS: Brain: No evidence of acute infarction, hemorrhage, hydrocephalus, extra-axial collection or mass lesion/mass effect. Mild ventricular dilatation similar to previous study, likely early central atrophy. Vascular: No hyperdense vessel or unexpected calcification. Skull: Calvarium appears intact. Sinuses/Orbits: Prominent mucosal thickening throughout the paranasal sinuses. No acute air-fluid levels. Left mastoid effusions. 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 IMPRESSION: No acute intracranial abnormalities. Inflammatory changes in the paranasal sinuses. Left mastoid effusions. Electronically Signed   By: Lucienne Capers M.D.   On: 03/07/2018 00:54   Ct Abdomen Pelvis W Contrast  Result Date: 03/06/2018 CLINICAL DATA:  Fever and vomiting EXAM: CT ABDOMEN AND PELVIS WITH CONTRAST TECHNIQUE: Multidetector CT imaging of the abdomen and pelvis was performed using the standard protocol following bolus administration of intravenous contrast. CONTRAST:  186mL ISOVUE-300 IOPAMIDOL (ISOVUE-300) INJECTION 61% COMPARISON:  05/17/2017 FINDINGS: Lower chest: Mild dependent atelectatic changes are noted. No focal infiltrate or sizable effusion is seen. Hepatobiliary: No focal liver abnormality is seen. No gallstones,  gallbladder wall thickening, or biliary dilatation. Pancreas: Unremarkable. No pancreatic ductal dilatation or surrounding inflammatory changes. Spleen: Normal in size without focal abnormality. Adrenals/Urinary Tract: Adrenal glands are within normal limits. Left kidney is within normal limits. Right kidney demonstrates evidence of partial upper pole nephrectomy stable from the prior exam. Nonobstructing lower pole renal stones are noted on the right. The previously seen renal pelvic stones are no longer identified. The bladder is partially decompressed. No ureteral stones are seen. Stomach/Bowel: Scattered air and fecal material is noted throughout the colon. No obstructive or inflammatory changes are seen. The appendix is within normal limits. No small bowel abnormality is noted. The stomach is within normal limits. Vascular/Lymphatic: Aortic atherosclerosis. No enlarged abdominal or pelvic lymph nodes. Reproductive: Status post hysterectomy. No adnexal masses. Other: No abdominal wall hernia or abnormality. No abdominopelvic ascites. Musculoskeletal: Degenerative changes of lumbar spine are noted. IMPRESSION: Stable nonobstructing right renal stones. Previously seen right renal pelvic stones are no longer identified. Mild dependent atelectatic changes. Postoperative changes in the upper pole of the right kidney. No acute abnormality noted. Electronically Signed   By: Inez Catalina M.D.   On: 03/06/2018 21:47   Dg Chest Port 1 View  Result Date: 03/07/2018 CLINICAL DATA:  Sepsis. EXAM: PORTABLE CHEST 1 VIEW COMPARISON:  Radiograph March 06, 2018. FINDINGS: Stable cardiomegaly. No pneumothorax or pleural effusion is noted. Both lungs are clear. The visualized skeletal structures are unremarkable. IMPRESSION: No active disease. Electronically Signed   By: Marijo Conception, M.D.   On: 03/07/2018 07:34   Dg Chest Port 1 View  Result Date: 03/06/2018 CLINICAL DATA:  Fever.  Altered mental status. EXAM: PORTABLE  CHEST 1 VIEW COMPARISON:  03/06/2018 FINDINGS: Shallow inspiration. Normal heart size and pulmonary vascularity. No focal airspace disease or consolidation in the lungs. No blunting of costophrenic angles. No pneumothorax. Mediastinal contours appear intact. IMPRESSION: No active disease. Electronically Signed   By: Lucienne Capers M.D.   On: 03/06/2018 21:28     Medications:  Scheduled: . atorvastatin  40 mg Oral q1800  . azithromycin  500 mg Oral Daily  . citalopram  20 mg Oral Daily  . enoxaparin (LOVENOX) injection  0.5 mg/kg Subcutaneous Q24H  . ferrous sulfate  325 mg Oral Q supper  . gabapentin  300 mg Oral QHS  . insulin aspart  0-15 Units Subcutaneous TID WC  . levothyroxine  137 mcg Oral QAC breakfast  . Lifitegrast  1 drop Both Eyes BID  . metoprolol succinate  12.5 mg Oral Daily  . mometasone-formoterol  2 puff Inhalation BID  . traZODone  100 mg Oral QHS   Continuous: . sodium chloride 100 mL/hr at 03/08/18 1115  . cefTRIAXone (ROCEPHIN)  IV Stopped (03/07/18 2146)   OFB:PZWCHENIDPOEU **OR** acetaminophen, albuterol, ondansetron **OR** ondansetron (ZOFRAN) IV, oxyCODONE    Assessment/Plan:   Sepsis, present on admission, unspecified organism Cultures negative so far.  Patient's respiratory viral panel is positive for parainfluenza virus.  Patient could have superimposed bacterial infection as her procalcitonin level is 5.69.  WBC however is normal.  Heart rate has improved.  Patient was given vancomycin and cefepime in the emergency department.  Currently appears to be on ceftriaxone which will be continued.  She is also on azithromycin.  Lactic acid level was normal when last checked.  Acute viral bronchitis Respiratory viral panel positive for parainfluenza virus.  She is wheezing significantly.  Continue nebulizer treatments.  She is on inhalers as well.  Will also place her on systemic steroids.  Influenza PCR was negative.Droplet precautions.  Chronic kidney  disease stage II/hypokalemia Baseline creatinine around 1.2.  Came in with a creatinine of 1.5.  Improved slightly with IV fluids.  Continue to monitor urine output.  Potassium level improved.  Give additional dose.  Acute metabolic encephalopathy Most likely due to all of the above.  Mentation has improved.  CT head did not show any acute findings.  No focal neurological deficits on exam.  Bilateral ear pain Most likely related to the above.  Mastoid effusion was noted on CT scan.  Continue antibiotics for now.  Essential hypertension Holding home medications in setting of sepsis  Diabetes mellitus type 2 Holding home medications.  SSI.  Normocytic anemia Drop in hemoglobin is likely dilutional.  No evidence of overt bleeding.  DVT Prophylaxis: Lovenox    Code Status: Full code Family Communication: Discussed with the patient and her husband Disposition Plan: Management as outlined above    LOS: 0 days   [place] [facility] Pager [phone] 03/08/2018, 2:10 PM  If 7PM-7AM, please contact night-coverage at [email or web address], password [facility]

## 2018-03-08 NOTE — Plan of Care (Signed)
  Problem: Health Behavior/Discharge Planning: Goal: Ability to manage health-related needs will improve Outcome: Progressing   Problem: Clinical Measurements: Goal: Ability to maintain clinical measurements within normal limits will improve Outcome: Progressing Goal: Will remain free from infection Outcome: Progressing Goal: Diagnostic test results will improve Outcome: Progressing Goal: Respiratory complications will improve Outcome: Progressing Goal: Cardiovascular complication will be avoided Outcome: Progressing   Problem: Activity: Goal: Risk for activity intolerance will decrease Outcome: Progressing   Problem: Nutrition: Goal: Adequate nutrition will be maintained Outcome: Progressing   Problem: Pain Managment: Goal: General experience of comfort will improve Outcome: Progressing   Problem: Safety: Goal: Ability to remain free from injury will improve Outcome: Progressing   Problem: Skin Integrity: Goal: Risk for impaired skin integrity will decrease Outcome: Progressing   

## 2018-03-09 DIAGNOSIS — Z85528 Personal history of other malignant neoplasm of kidney: Secondary | ICD-10-CM | POA: Diagnosis not present

## 2018-03-09 DIAGNOSIS — D631 Anemia in chronic kidney disease: Secondary | ICD-10-CM | POA: Diagnosis present

## 2018-03-09 DIAGNOSIS — Z86010 Personal history of colonic polyps: Secondary | ICD-10-CM | POA: Diagnosis not present

## 2018-03-09 DIAGNOSIS — Z87442 Personal history of urinary calculi: Secondary | ICD-10-CM | POA: Diagnosis not present

## 2018-03-09 DIAGNOSIS — A419 Sepsis, unspecified organism: Secondary | ICD-10-CM | POA: Diagnosis present

## 2018-03-09 DIAGNOSIS — D649 Anemia, unspecified: Secondary | ICD-10-CM

## 2018-03-09 DIAGNOSIS — E538 Deficiency of other specified B group vitamins: Secondary | ICD-10-CM | POA: Diagnosis present

## 2018-03-09 DIAGNOSIS — G4733 Obstructive sleep apnea (adult) (pediatric): Secondary | ICD-10-CM | POA: Diagnosis present

## 2018-03-09 DIAGNOSIS — D509 Iron deficiency anemia, unspecified: Secondary | ICD-10-CM | POA: Diagnosis present

## 2018-03-09 DIAGNOSIS — E7800 Pure hypercholesterolemia, unspecified: Secondary | ICD-10-CM | POA: Diagnosis present

## 2018-03-09 DIAGNOSIS — I129 Hypertensive chronic kidney disease with stage 1 through stage 4 chronic kidney disease, or unspecified chronic kidney disease: Secondary | ICD-10-CM | POA: Diagnosis present

## 2018-03-09 DIAGNOSIS — E1122 Type 2 diabetes mellitus with diabetic chronic kidney disease: Secondary | ICD-10-CM | POA: Diagnosis present

## 2018-03-09 DIAGNOSIS — M47816 Spondylosis without myelopathy or radiculopathy, lumbar region: Secondary | ICD-10-CM | POA: Diagnosis present

## 2018-03-09 DIAGNOSIS — R4182 Altered mental status, unspecified: Secondary | ICD-10-CM | POA: Diagnosis present

## 2018-03-09 DIAGNOSIS — N182 Chronic kidney disease, stage 2 (mild): Secondary | ICD-10-CM | POA: Diagnosis present

## 2018-03-09 DIAGNOSIS — J204 Acute bronchitis due to parainfluenza virus: Secondary | ICD-10-CM | POA: Diagnosis present

## 2018-03-09 DIAGNOSIS — I10 Essential (primary) hypertension: Secondary | ICD-10-CM | POA: Diagnosis not present

## 2018-03-09 DIAGNOSIS — Z8249 Family history of ischemic heart disease and other diseases of the circulatory system: Secondary | ICD-10-CM | POA: Diagnosis not present

## 2018-03-09 DIAGNOSIS — G9341 Metabolic encephalopathy: Secondary | ICD-10-CM | POA: Diagnosis present

## 2018-03-09 DIAGNOSIS — J45909 Unspecified asthma, uncomplicated: Secondary | ICD-10-CM | POA: Diagnosis present

## 2018-03-09 DIAGNOSIS — J208 Acute bronchitis due to other specified organisms: Secondary | ICD-10-CM | POA: Diagnosis not present

## 2018-03-09 DIAGNOSIS — Z87891 Personal history of nicotine dependence: Secondary | ICD-10-CM | POA: Diagnosis not present

## 2018-03-09 DIAGNOSIS — E876 Hypokalemia: Secondary | ICD-10-CM | POA: Diagnosis present

## 2018-03-09 DIAGNOSIS — E039 Hypothyroidism, unspecified: Secondary | ICD-10-CM | POA: Diagnosis present

## 2018-03-09 DIAGNOSIS — H9203 Otalgia, bilateral: Secondary | ICD-10-CM | POA: Diagnosis present

## 2018-03-09 DIAGNOSIS — R6520 Severe sepsis without septic shock: Secondary | ICD-10-CM | POA: Diagnosis not present

## 2018-03-09 DIAGNOSIS — Z905 Acquired absence of kidney: Secondary | ICD-10-CM | POA: Diagnosis not present

## 2018-03-09 DIAGNOSIS — E1142 Type 2 diabetes mellitus with diabetic polyneuropathy: Secondary | ICD-10-CM | POA: Diagnosis present

## 2018-03-09 DIAGNOSIS — K760 Fatty (change of) liver, not elsewhere classified: Secondary | ICD-10-CM | POA: Diagnosis present

## 2018-03-09 LAB — RETICULOCYTES
Immature Retic Fract: 12.3 % (ref 2.3–15.9)
RBC.: 3.38 MIL/uL — ABNORMAL LOW (ref 3.87–5.11)
RETIC CT PCT: 1.7 % (ref 0.4–3.1)
Retic Count, Absolute: 55.8 10*3/uL (ref 19.0–186.0)

## 2018-03-09 LAB — CBC
HCT: 31.3 % — ABNORMAL LOW (ref 36.0–46.0)
Hemoglobin: 10 g/dL — ABNORMAL LOW (ref 12.0–15.0)
MCH: 29.6 pg (ref 26.0–34.0)
MCHC: 31.9 g/dL (ref 30.0–36.0)
MCV: 92.6 fL (ref 80.0–100.0)
Platelets: 209 10*3/uL (ref 150–400)
RBC: 3.38 MIL/uL — ABNORMAL LOW (ref 3.87–5.11)
RDW: 13.2 % (ref 11.5–15.5)
WBC: 6.8 10*3/uL (ref 4.0–10.5)
nRBC: 0 % (ref 0.0–0.2)

## 2018-03-09 LAB — BASIC METABOLIC PANEL
ANION GAP: 13 (ref 5–15)
BUN: 30 mg/dL — ABNORMAL HIGH (ref 8–23)
CO2: 18 mmol/L — ABNORMAL LOW (ref 22–32)
Calcium: 7.6 mg/dL — ABNORMAL LOW (ref 8.9–10.3)
Chloride: 107 mmol/L (ref 98–111)
Creatinine, Ser: 1.41 mg/dL — ABNORMAL HIGH (ref 0.44–1.00)
GFR calc Af Amer: 46 mL/min — ABNORMAL LOW (ref >60)
GFR calc non Af Amer: 40 mL/min — ABNORMAL LOW (ref >60)
Glucose, Bld: 189 mg/dL — ABNORMAL HIGH (ref 70–99)
Potassium: 3.8 mmol/L (ref 3.5–5.1)
Sodium: 138 mmol/L (ref 135–145)

## 2018-03-09 LAB — IRON AND TIBC
Iron: 50 ug/dL (ref 28–170)
Saturation Ratios: 19 % (ref 10.4–31.8)
TIBC: 257 ug/dL (ref 250–450)
UIBC: 207 ug/dL

## 2018-03-09 LAB — GLUCOSE, CAPILLARY
Glucose-Capillary: 204 mg/dL — ABNORMAL HIGH (ref 70–99)
Glucose-Capillary: 170 mg/dL — ABNORMAL HIGH (ref 70–99)
Glucose-Capillary: 178 mg/dL — ABNORMAL HIGH (ref 70–99)
Glucose-Capillary: 182 mg/dL — ABNORMAL HIGH (ref 70–99)

## 2018-03-09 LAB — VITAMIN B12: Vitamin B-12: 496 pg/mL (ref 180–914)

## 2018-03-09 LAB — FOLATE: Folate: 11.6 ng/mL (ref >5.9)

## 2018-03-09 LAB — FERRITIN: Ferritin: 283 ng/mL (ref 11–307)

## 2018-03-09 LAB — PROCALCITONIN: PROCALCITONIN: 2.83 ng/mL

## 2018-03-09 NOTE — Progress Notes (Signed)
[facility] PROGRESS NOTE  Savannah [name] UYQ:[id number] DOB: 12-25-1955 DOA: 03/06/2018  PCP: Biagio Borg, MD  Brief History/Interval Summary: 63 y.o. female with medical history significant of DM2, HTN.  She presented to the emergency department with fever and altered mental status.  Recently diagnosed with upper respiratory tract infection and was started on amoxicillin.  Patient was noted to have a fever of 103, she was tachycardic, no clear source of infection.  She was hospitalized for further management.  Reason for Visit: Sepsis.  Acute viral bronchitis.  Consultants: None  Procedures: None  Antibiotics: Was given vancomycin and cefepime initially. Currently on ceftriaxone and azithromycin.  Subjective/Interval History: Patient still with shortness of breath and wheezing but less so compared to yesterday.  Denies any chest pain.  No nausea vomiting.    ROS: No headaches  Objective:  Vital Signs  Vitals:   03/08/18 2038 03/08/18 2125 03/09/18 0701 03/09/18 0842  BP: (!) 141/66  (!) 143/62   Pulse: 67  (!) 57   Resp: 20  18   Temp: 99.7 F (37.6 C)  98.5 F (36.9 C)   TempSrc: Oral  Oral   SpO2: 95% 98% 97% 97%  Weight:      Height:        Intake/Output Summary (Last 24 hours) at 03/09/2018 1010 Last data filed at 03/09/2018 0200 Gross per 24 hour  Intake 1692.58 ml  Output -  Net 1692.58 ml   Filed Weights   03/07/18 1521  Weight: 106 kg   General appearance: Awake alert.  In no distress Resp: Less tachypneic compared to yesterday.  No use of accessory muscles.  Continues to have wheezing bilaterally but less so compared to yesterday.  Few crackles at the bases. Cardio: S1-S2 is normal regular.  No S3-S4.  No rubs murmurs or bruit GI: Abdomen is soft.  Nontender nondistended.  Bowel sounds are present normal.  No masses organomegaly Extremities: No edema.  Full range of motion of lower extremities. Neurologic: Alert and oriented x3.  No  focal neurological deficits.    Lab Results:  Data Reviewed: I have personally reviewed following labs and imaging studies  CBC: Recent Labs  Lab 03/06/18 1151 03/06/18 2203 03/07/18 0509 03/08/18 0543 03/09/18 0431  WBC 9.0 9.9 10.4 9.3 6.8  NEUTROABS  --  8.4*  --   --   --   HGB 12.6 11.0* 10.3* 9.9* 10.0*  HCT 35.9* 32.3* 30.9* 31.3* 31.3*  MCV 87.2 88.7 90.9 94.3 92.6  PLT 185.0 152 131* 173 638    Basic Metabolic Panel: Recent Labs  Lab 03/06/18 1151 03/06/18 2203 03/07/18 0509 03/08/18 0543 03/09/18 0431  NA 135 137 140 141 138  K 3.2* 2.3* 3.2* 3.4* 3.8  CL 96 100 105 106 107  CO2 27 23 24 24  18*  GLUCOSE 146* 200* 202* 97 189*  BUN 19 25* 24* 29* 30*  CREATININE 1.24* 1.50* 1.41* 1.48* 1.41*  CALCIUM 9.0 8.1* 7.9* 7.4* 7.6*    GFR: Estimated Creatinine Clearance: 52 mL/min (A) (by C-G formula based on SCr of 1.41 mg/dL (H)).  Liver Function Tests: Recent Labs  Lab 03/06/18 2203 03/07/18 0509  AST 15 14*  ALT 12 13  ALKPHOS 44 42  BILITOT 2.4* 1.9*  PROT 7.0 6.8  ALBUMIN 3.6 3.4*    Recent Labs  Lab 03/06/18 2203  LIPASE 29   Recent Labs  Lab 03/07/18 0116  AMMONIA 28    CBG: Recent  Labs  Lab 03/08/18 0734 03/08/18 1128 03/08/18 1643 03/08/18 2034 03/09/18 0947  GLUCAP 105* 111* 89 163* 204*   Thyroid Function Tests: Recent Labs    03/07/18 0116  TSH 2.494     Recent Results (from the past 240 hour(s))  Blood Culture (routine x 2)     Status: None (Preliminary result)   Collection Time: 03/06/18 10:06 PM  Result Value Ref Range Status   Specimen Description   Final    BLOOD RIGHT ANTECUBITAL Performed at [facility], [place] [address], [place], [place] [address]    Special Requests   Final    BOTTLES DRAWN AEROBIC AND ANAEROBIC Blood Culture adequate volume Performed at [place] [address], [place], [place] [address]    Culture   Final    NO GROWTH 2 DAYS Performed  at [place] [address], [place], [place] [address]    Report Status PENDING  Incomplete  Blood Culture (routine x 2)     Status: None (Preliminary result)   Collection Time: 03/06/18 10:06 PM  Result Value Ref Range Status   Specimen Description   Final    BLOOD LEFT ANTECUBITAL Performed at [place] [address] [place], [place] [address]    Special Requests   Final    BOTTLES DRAWN AEROBIC AND ANAEROBIC BACTEROIDES CACCAE Performed at [facility], [place] [address], [place], [place] [address]    Culture   Final    NO GROWTH 2 DAYS Performed at [place] [address], [place], [place] [address]    Report Status PENDING  Incomplete  Respiratory Panel by PCR     Status: Abnormal   Collection Time: 03/06/18 10:18 PM  Result Value Ref Range Status   Adenovirus NOT DETECTED NOT DETECTED Final   Coronavirus 229E NOT DETECTED NOT DETECTED Final   Coronavirus HKU1 NOT DETECTED NOT DETECTED Final   Coronavirus NL63 NOT DETECTED NOT DETECTED Final   Coronavirus OC43 NOT DETECTED NOT DETECTED Final   Metapneumovirus NOT DETECTED NOT DETECTED Final   Rhinovirus / Enterovirus NOT DETECTED NOT DETECTED Final   Influenza A NOT DETECTED NOT DETECTED Final   Influenza B NOT DETECTED NOT DETECTED Final   Parainfluenza Virus 1 DETECTED (A) NOT DETECTED Final   Parainfluenza Virus 2 NOT DETECTED NOT DETECTED Final   Parainfluenza Virus 3 NOT DETECTED NOT DETECTED Final   Parainfluenza Virus 4 NOT DETECTED NOT DETECTED Final   Respiratory Syncytial Virus NOT DETECTED NOT DETECTED Final   Bordetella pertussis NOT DETECTED NOT DETECTED Final   Chlamydophila pneumoniae NOT DETECTED NOT DETECTED Final   Mycoplasma pneumoniae NOT DETECTED NOT DETECTED Final    Comment: Performed at [facility], [place] [address] [place], [place] [address]  Urine culture     Status: None   Collection Time: 03/06/18 10:57 PM  Result Value Ref  Range Status   Specimen Description URINE, CLEAN CATCH  Final   Special Requests   Final    NONE Performed at [place] [address], [place],  [address]    Culture NO GROWTH  Final   Report Status 03/08/2018 FINAL  Final      Radiology Studies: No results found.   Medications:  Scheduled: . atorvastatin  40 mg Oral q1800  . azithromycin  500 mg Oral Daily  . citalopram  20 mg Oral Daily  . enoxaparin (LOVENOX) injection  0.5 mg/kg Subcutaneous Q24H  . ferrous sulfate  325 mg Oral Q supper  . gabapentin  300 mg Oral QHS  . insulin aspart  0-15 Units Subcutaneous TID WC  . levothyroxine  137 mcg Oral QAC breakfast  . Lifitegrast  1 drop Both Eyes BID  . methylPREDNISolone (SOLU-MEDROL) injection  60 mg Intravenous Q12H  . metoprolol succinate  12.5 mg Oral Daily  . mometasone-formoterol  2 puff Inhalation BID   Continuous: . sodium chloride 75 mL/hr at 03/09/18 0200  . cefTRIAXone (ROCEPHIN)  IV 1 g (03/08/18 2301)   YTK:[id number] **OR** acetaminophen, albuterol, ondansetron **OR** ondansetron (ZOFRAN) IV, oxyCODONE, traZODone    Assessment/Plan:   Sepsis, present on admission, unspecified organism Cultures negative so far.  Patient's respiratory viral panel is positive for parainfluenza virus.  Patient could have superimposed bacterial infection as her procalcitonin level was 5.69.  Improved to 2.83 this morning.  WBC remains normal.  Patient was given vancomycin and cefepime in the emergency department.  Now on azithromycin and ceftriaxone which will be continued.  Lactic acid level was normal when last checked.  Seems to be stabilizing.  Acute viral bronchitis Respiratory viral panel positive for parainfluenza virus.  Patient to be continued on systemic steroids for today.  Respiratory status appears to have improved.  She is saturating normal on room air.  Continue nebulizer treatments.  Droplet precautions.  Influenza PCR was  negative.   Chronic kidney disease stage II/hypokalemia Baseline creatinine around 1.2.  Came in with a creatinine of 1.5.  Improved slightly with IV fluids.  Continue to monitor urine output.  Potassium is normal today.  Acute metabolic encephalopathy Most likely due to all of the above.  Mentation has improved with treatment.  CT head did not show any acute findings.  No focal neurological deficits.    Bilateral ear pain Most likely related to the above.  Mastoid effusion was noted on CT scan.  Continue antibiotics for now.  Essential hypertension Holding home medications in setting of sepsis stable.  Diabetes mellitus type 2 Holding home medications.  SSI.  Normocytic anemia Drop in hemoglobin is dilutional.  Stable this morning.  Anemia panel shows a B12 of 496, folate 11.6, ferritin 283.  DVT Prophylaxis: Lovenox    Code Status: Full code Family Communication: Discussed with the patient and her husband Disposition Plan: Mobilize as tolerated.  Patient seems to be stabilizing.    LOS: 0 days   [place] [facility] Pager [phone] 03/09/2018, 10:10 AM  If 7PM-7AM, please contact night-coverage at [email or web address], password [facility]

## 2018-03-09 NOTE — Progress Notes (Signed)
Pt has refused CPAP for the night, machine remains at bedside.  RT to monitor and assess as needed.  

## 2018-03-10 ENCOUNTER — Ambulatory Visit: Payer: BLUE CROSS/BLUE SHIELD | Admitting: Internal Medicine

## 2018-03-10 ENCOUNTER — Telehealth: Payer: Self-pay

## 2018-03-10 LAB — BASIC METABOLIC PANEL
Anion gap: 12 (ref 5–15)
BUN: 31 mg/dL — ABNORMAL HIGH (ref 8–23)
CO2: 22 mmol/L (ref 22–32)
Calcium: 7.9 mg/dL — ABNORMAL LOW (ref 8.9–10.3)
Chloride: 111 mmol/L (ref 98–111)
Creatinine, Ser: 1.38 mg/dL — ABNORMAL HIGH (ref 0.44–1.00)
GFR calc Af Amer: 47 mL/min — ABNORMAL LOW (ref >60)
GFR calc non Af Amer: 41 mL/min — ABNORMAL LOW (ref >60)
Glucose, Bld: 175 mg/dL — ABNORMAL HIGH (ref 70–99)
Potassium: 3.5 mmol/L (ref 3.5–5.1)
Sodium: 145 mmol/L (ref 135–145)

## 2018-03-10 LAB — GLUCOSE, CAPILLARY
Glucose-Capillary: 169 mg/dL — ABNORMAL HIGH (ref 70–99)
Glucose-Capillary: 232 mg/dL — ABNORMAL HIGH (ref 70–99)
Glucose-Capillary: 99 mg/dL (ref 70–99)
Glucose-Capillary: 211 mg/dL — ABNORMAL HIGH (ref 70–99)

## 2018-03-10 MED ORDER — PREDNISONE 50 MG PO TABS
60.0000 mg | ORAL_TABLET | Freq: Every day | ORAL | Status: DC
  Administered 2018-03-10 – 2018-03-11 (×2): 60 mg via ORAL
  Filled 2018-03-10 (×2): qty 1

## 2018-03-10 MED ORDER — DEXTROMETHORPHAN POLISTIREX ER 30 MG/5ML PO SUER
30.0000 mg | Freq: Two times a day (BID) | ORAL | Status: DC | PRN
  Administered 2018-03-10 (×2): 30 mg via ORAL
  Filled 2018-03-10 (×3): qty 5

## 2018-03-10 MED ORDER — POTASSIUM CHLORIDE CRYS ER 20 MEQ PO TBCR
40.0000 meq | EXTENDED_RELEASE_TABLET | Freq: Once | ORAL | Status: AC
  Administered 2018-03-10: 40 meq via ORAL
  Filled 2018-03-10: qty 2

## 2018-03-10 MED ORDER — CEFDINIR 300 MG PO CAPS
300.0000 mg | ORAL_CAPSULE | Freq: Two times a day (BID) | ORAL | Status: DC
  Administered 2018-03-10 – 2018-03-11 (×3): 300 mg via ORAL
  Filled 2018-03-10 (×3): qty 1

## 2018-03-10 NOTE — Evaluation (Signed)
Physical Therapy Evaluation Patient Details Name: Savannah [name] MRN: [id number] DOB: November 19, 1955 Today's Date: 03/10/2018   History of Present Illness  63 y.o. female with medical history significant of DM2, HTN.  She presented to the emergency department with fever and altered mental status.  Recently diagnosed with upper respiratory tract infection and admitted for acute viral bronchitis and sepsis  Clinical Impression  Pt admitted with above diagnosis. Pt currently with functional limitations due to the deficits listed below (see PT Problem List).  Pt will benefit from skilled PT to increase their independence and safety with mobility to allow discharge to the venue listed below.   Pt ambulates with RW and has hx significant for peripheral neuropathy and compound foot fracture.  Pt would benefit from OP PT neuro if agreeable.  SpO2 100% on room air upon returning to room and pt reports moderate dyspnea (also wearing droplet mask). Daughter present and reports family can provide 24/7 supervision as pt presents as high fall risk at this time.     Follow Up Recommendations Outpatient PT(OP PT neuro if agreeable)    Equipment Recommendations  None recommended by PT    Recommendations for Other Services       Precautions / Restrictions Precautions Precautions: Fall      Mobility  Bed Mobility Overal bed mobility: Needs Assistance Bed Mobility: Supine to Sit;Sit to Supine     Supine to sit: Supervision Sit to supine: Supervision      Transfers Overall transfer level: Needs assistance Equipment used: Rolling walker (2 wheeled) Transfers: Sit to/from Stand Sit to Stand: Min guard         General transfer comment: min/guard for safety, posterior lean upon rise however pt self corrected  Ambulation/Gait Ambulation/Gait assistance: Min guard Gait Distance (Feet): 60 Feet Assistive device: Rolling walker (2 wheeled) Gait Pattern/deviations: Step-to pattern;Step-through  pattern;Decreased stance time - left;Decreased weight shift to left;Decreased dorsiflexion - left;Decreased dorsiflexion - right     General Gait Details: pt with poor coordination and occasional increased hip/knee flexion for swing phase (pt unable to feel feet - poor proprioception)  Stairs            Wheelchair Mobility    Modified Rankin (Stroke Patients Only)       Balance Overall balance assessment: History of Falls                                           Pertinent Vitals/Pain Pain Assessment: No/denies pain    Home Living Family/patient expects to be discharged to:: Private residence Living Arrangements: Spouse/significant other;Children;Other relatives Available Help at Discharge: Family;Available 24 hours/day Type of Home: Apartment Home Access: Level entry     Home Layout: One level        Prior Function Level of Independence: Independent with assistive device(s)         Comments: ambulates with RW     Hand Dominance        Extremity/Trunk Assessment        Lower Extremity Assessment Lower Extremity Assessment: Generalized weakness;RLE deficits/detail;LLE deficits/detail RLE Deficits / Details: pt reports hx of 'severed' Achilles tendon and ankle fractures in 2006  RLE Sensation: history of peripheral neuropathy LLE Sensation: history of peripheral neuropathy       Communication   Communication: HOH  Cognition Arousal/Alertness: Awake/alert Behavior During Therapy: WFL for tasks assessed/performed Overall Cognitive Status:  Within Functional Limits for tasks assessed                                        General Comments      Exercises     Assessment/Plan    PT Assessment Patient needs continued PT services  PT Problem List Decreased mobility;Decreased strength;Decreased balance;Decreased activity tolerance;Decreased knowledge of use of DME;Impaired sensation;Decreased coordination        PT Treatment Interventions DME instruction;Functional mobility training;Gait training;Therapeutic activities;Neuromuscular re-education;Stair training;Therapeutic exercise;Balance training;Patient/family education    PT Goals (Current goals can be found in the Care Plan section)  Acute Rehab PT Goals PT Goal Formulation: With patient Time For Goal Achievement: 03/24/18 Potential to Achieve Goals: Good    Frequency Min 3X/week   Barriers to discharge        Co-evaluation               AM-PAC PT "6 Clicks" Mobility  Outcome Measure Help needed turning from your back to your side while in a flat bed without using bedrails?: None Help needed moving from lying on your back to sitting on the side of a flat bed without using bedrails?: A Little Help needed moving to and from a bed to a chair (including a wheelchair)?: A Little Help needed standing up from a chair using your arms (e.g., wheelchair or bedside chair)?: A Little Help needed to walk in hospital room?: A Little Help needed climbing 3-5 steps with a railing? : A Little 6 Click Score: 19    End of Session Equipment Utilized During Treatment: Gait belt Activity Tolerance: Patient limited by fatigue Patient left: in bed;with call bell/phone within reach;with bed alarm set;with family/visitor present   PT Visit Diagnosis: Other abnormalities of gait and mobility (R26.89)    Time: 1950-9326 PT Time Calculation (min) (ACUTE ONLY): 15 min   Charges:   PT Evaluation $PT Eval Low Complexity: [place], PT, DPT Acute Rehabilitation Services Office: [phone] Pager: [phone]  Trena Platt 03/10/2018, 3:51 PM

## 2018-03-10 NOTE — Telephone Encounter (Signed)
Copied from [place] [id number]. Topic: General - Other >> Mar 10, 2018  9:25 AM Alanda Slim E wrote: Reason for CRM: Pt daughter called in to cancel Pt appt due to Pt being hospitalized since 1.09.2020 for para influenza. Daughter also inquired about if Pt had her ears looked at during last OV/ but no DPR on file to speak with her. She wanted Dr. Jenny Reichmann or Caesar Chestnut to advised the nurse at [facility] about ears to see if they have improved. Pt is still complaining about her ears. Derrek Monaco advise

## 2018-03-10 NOTE — Progress Notes (Addendum)
[facility] PROGRESS NOTE  Savannah [name] NFA:[id number] DOB: Jan 25, 1956 DOA: 03/06/2018  PCP: Biagio Borg, MD  Brief History/Interval Summary: 63 y.o. female with medical history significant of DM2, HTN.  She presented to the emergency department with fever and altered mental status.  Recently diagnosed with upper respiratory tract infection and was started on amoxicillin.  Patient was noted to have a fever of 103, she was tachycardic, no clear source of infection.  She was hospitalized for further management.  Reason for Visit: Sepsis.  Acute viral bronchitis.  Consultants: None  Procedures: None  Antibiotics: Was given vancomycin and cefepime initially. Subsequently changed over to ceftriaxone and azithromycin.  Subjective/Interval History: Patient overall feeling stronger.  Still has cough which is dry.  Wheezing has improved.  Feels unsteady on her feet.  ROS: No headaches  Objective:  Vital Signs  Vitals:   03/10/18 0000 03/10/18 0001 03/10/18 0656 03/10/18 1012  BP:   (!) 145/67   Pulse:   (!) 54   Resp:   20   Temp:   97.8 F (36.6 C)   TempSrc:   Oral   SpO2: (!) 89% 97% 99% 98%  Weight:      Height:        Intake/Output Summary (Last 24 hours) at 03/10/2018 1214 Last data filed at 03/09/2018 2312 Gross per 24 hour  Intake 312 ml  Output -  Net 312 ml   Filed Weights   03/07/18 1521  Weight: 106 kg   General appearance: Awake alert.  In no distress Resp: Less tachypneic.  No use of accessory muscles.  Continues to have scattered wheezing bilaterally but less so compared to yesterday.  Few crackles at the bases. Cardio: S1-S2 is normal regular.  No S3-S4.  No rubs murmurs or bruit GI: Abdomen is soft.  Nontender nondistended.  Bowel sounds are present normal.  No masses organomegaly Extremities: No edema.  Full range of motion of lower extremities. Neurologic: Alert and oriented x3.  No focal neurological deficits.     Lab  Results:  Data Reviewed: I have personally reviewed following labs and imaging studies  CBC: Recent Labs  Lab 03/06/18 1151 03/06/18 2203 03/07/18 0509 03/08/18 0543 03/09/18 0431  WBC 9.0 9.9 10.4 9.3 6.8  NEUTROABS  --  8.4*  --   --   --   HGB 12.6 11.0* 10.3* 9.9* 10.0*  HCT 35.9* 32.3* 30.9* 31.3* 31.3*  MCV 87.2 88.7 90.9 94.3 92.6  PLT 185.0 152 131* 173 469    Basic Metabolic Panel: Recent Labs  Lab 03/06/18 2203 03/07/18 0509 03/08/18 0543 03/09/18 0431 03/10/18 0454  NA 137 140 141 138 145  K 2.3* 3.2* 3.4* 3.8 3.5  CL 100 105 106 107 111  CO2 23 24 24  18* 22  GLUCOSE 200* 202* 97 189* 175*  BUN 25* 24* 29* 30* 31*  CREATININE 1.50* 1.41* 1.48* 1.41* 1.38*  CALCIUM 8.1* 7.9* 7.4* 7.6* 7.9*    GFR: Estimated Creatinine Clearance: 53.2 mL/min (A) (by C-G formula based on SCr of 1.38 mg/dL (H)).  Liver Function Tests: Recent Labs  Lab 03/06/18 2203 03/07/18 0509  AST 15 14*  ALT 12 13  ALKPHOS 44 42  BILITOT 2.4* 1.9*  PROT 7.0 6.8  ALBUMIN 3.6 3.4*    Recent Labs  Lab 03/06/18 2203  LIPASE 29   Recent Labs  Lab 03/07/18 0116  AMMONIA 28    CBG: Recent Labs  Lab 03/09/18 1144 03/09/18  1739 03/09/18 2200 03/10/18 0735 03/10/18 1143  GLUCAP 170* 178* 182* 169* 232*   Thyroid Function Tests: No results for input(s): TSH, T4TOTAL, FREET4, T3FREE, THYROIDAB in the last 72 hours.   Recent Results (from the past 240 hour(s))  Blood Culture (routine x 2)     Status: None (Preliminary result)   Collection Time: 03/06/18 10:06 PM  Result Value Ref Range Status   Specimen Description   Final    BLOOD RIGHT ANTECUBITAL Performed at [facility] [address], [place], [place] [address]    Special Requests   Final    BOTTLES DRAWN AEROBIC AND ANAEROBIC Blood Culture adequate volume Performed at [facility] [address], [place], [place] [address]    Culture   Final    NO GROWTH 3  DAYS Performed at [facility], [place] [address], [place], [place] [address]    Report Status PENDING  Incomplete  Blood Culture (routine x 2)     Status: None (Preliminary result)   Collection Time: 03/06/18 10:06 PM  Result Value Ref Range Status   Specimen Description   Final    BLOOD LEFT ANTECUBITAL Performed at [facility] [address] [place], [place] [address]    Special Requests   Final    BOTTLES DRAWN AEROBIC AND ANAEROBIC BACTEROIDES CACCAE Performed at [facility], [place] [address], [place], [place] [address]    Culture   Final    NO GROWTH 3 DAYS Performed at [facility], [place] [address], [place], [place] [address]    Report Status PENDING  Incomplete  Respiratory Panel by PCR     Status: Abnormal   Collection Time: 03/06/18 10:18 PM  Result Value Ref Range Status   Adenovirus NOT DETECTED NOT DETECTED Final   Coronavirus 229E NOT DETECTED NOT DETECTED Final   Coronavirus HKU1 NOT DETECTED NOT DETECTED Final   Coronavirus NL63 NOT DETECTED NOT DETECTED Final   Coronavirus OC43 NOT DETECTED NOT DETECTED Final   Metapneumovirus NOT DETECTED NOT DETECTED Final   Rhinovirus / Enterovirus NOT DETECTED NOT DETECTED Final   Influenza A NOT DETECTED NOT DETECTED Final   Influenza B NOT DETECTED NOT DETECTED Final   Parainfluenza Virus 1 DETECTED (A) NOT DETECTED Final   Parainfluenza Virus 2 NOT DETECTED NOT DETECTED Final   Parainfluenza Virus 3 NOT DETECTED NOT DETECTED Final   Parainfluenza Virus 4 NOT DETECTED NOT DETECTED Final   Respiratory Syncytial Virus NOT DETECTED NOT DETECTED Final   Bordetella pertussis NOT DETECTED NOT DETECTED Final   Chlamydophila pneumoniae NOT DETECTED NOT DETECTED Final   Mycoplasma pneumoniae NOT DETECTED NOT DETECTED Final    Comment: Performed at [facility], [place] [address], [place], [place] [address]  Urine culture     Status: None   Collection Time: 03/06/18 10:57 PM   Result Value Ref Range Status   Specimen Description URINE, CLEAN CATCH  Final   Special Requests   Final    NONE Performed at [facility] [address] [place], [place] [address]    Culture NO GROWTH  Final   Report Status 03/08/2018 FINAL  Final      Radiology Studies: No results found.   Medications:  Scheduled: . atorvastatin  40 mg Oral q1800  . azithromycin  500 mg Oral Daily  . cefdinir  300 mg Oral Q12H  . citalopram  20 mg Oral Daily  .  enoxaparin (LOVENOX) injection  0.5 mg/kg Subcutaneous Q24H  . ferrous sulfate  325 mg Oral Q supper  . gabapentin  300 mg Oral QHS  . insulin aspart  0-15 Units Subcutaneous TID WC  . levothyroxine  137 mcg Oral QAC breakfast  . Lifitegrast  1 drop Both Eyes BID  . metoprolol succinate  12.5 mg Oral Daily  . mometasone-formoterol  2 puff Inhalation BID  . predniSONE  60 mg Oral Q breakfast   Continuous: . sodium chloride 50 mL/hr at 03/10/18 0900   OTR:[id number] **OR** acetaminophen, albuterol, dextromethorphan, ondansetron **OR** ondansetron (ZOFRAN) IV, oxyCODONE, traZODone    Assessment/Plan:   Sepsis, present on admission, unspecified organism Cultures negative so far.  Patient's respiratory viral panel is positive for parainfluenza virus.  Patient could have superimposed bacterial infection as her procalcitonin level was 5.69.  Improved to 2.83.  Patient was given vancomycin and cefepime in the emergency department.  Then transition to ceftriaxone and azithromycin.  Change ceftriaxone to cefdinir.    Acute viral bronchitis Respiratory viral panel positive for parainfluenza virus.  Patient is slowly improving.  Less wheezing today compared to yesterday.  Change to oral steroids.  Ambulate.  PT evaluation as she feels unsteady on her gait.  Continue droplet precautions.  Influenza PCR negative.   Chronic kidney disease stage II/hypokalemia Baseline creatinine around 1.2.  Came in with a  creatinine of 1.5.  Creatinine has continued to improve.  Potassium is 3.5 today.  Will give additional dose.  Monitor urine output.   Acute metabolic encephalopathy Most likely due to all of the above.  Mentation has improved with treatment.  CT head did not show any acute findings.  No focal neurological deficits.    Bilateral ear pain Most likely related to the above.  Examination of the ears did show dull tympanic membranes.  Mastoid effusion was noted on CT scan.  Continue antibiotics for now.  Patient reassured.  Feels better this morning.  Essential hypertension Holding home medications in setting of sepsis stable.  Diabetes mellitus type 2 Holding home medications.  SSI.  Normocytic anemia Drop in hemoglobin is dilutional.  No evidence for overt bleeding.  Anemia panel shows a B12 of 496, folate 11.6, ferritin 283.  History of obstructive sleep apnea Patient declined CPAP last night as a result she was noted to have low O2 saturations.  Patient educated about sleep apnea.  DVT Prophylaxis: Lovenox    Code Status: Full code Family Communication: Discussed with the patient and her daughter. Disposition Plan: PT evaluation.  Change to oral steroids.  Hopefully discharge in the next 24 to 48 hours.    LOS: 1 day   [facility] Pager [phone] 03/10/2018, 12:14 PM  If 7PM-7AM, please contact night-coverage at [email or web address], password [facility]

## 2018-03-11 LAB — GLUCOSE, CAPILLARY: GLUCOSE-CAPILLARY: 181 mg/dL — AB (ref 70–99)

## 2018-03-11 LAB — BASIC METABOLIC PANEL
Anion gap: 9 (ref 5–15)
BUN: 31 mg/dL — ABNORMAL HIGH (ref 8–23)
CO2: 21 mmol/L — ABNORMAL LOW (ref 22–32)
Calcium: 8.3 mg/dL — ABNORMAL LOW (ref 8.9–10.3)
Chloride: 110 mmol/L (ref 98–111)
Creatinine, Ser: 1.42 mg/dL — ABNORMAL HIGH (ref 0.44–1.00)
GFR calc Af Amer: 45 mL/min — ABNORMAL LOW (ref >60)
GFR, EST NON AFRICAN AMERICAN: 39 mL/min — AB (ref >60)
Glucose, Bld: 283 mg/dL — ABNORMAL HIGH (ref 70–99)
Potassium: 4.1 mmol/L (ref 3.5–5.1)
Sodium: 140 mmol/L (ref 135–145)

## 2018-03-11 MED ORDER — DEXTROMETHORPHAN POLISTIREX ER 30 MG/5ML PO SUER: 30.0000 mg | Freq: Two times a day (BID) | ORAL | 0 refills | Status: DC | PRN

## 2018-03-11 MED ORDER — PREDNISONE 20 MG PO TABS: ORAL_TABLET | ORAL | 0 refills | Status: DC

## 2018-03-11 NOTE — Discharge Instructions (Signed)
Acute Bronchitis, Adult Acute bronchitis is when air tubes (bronchi) in the lungs suddenly get swollen. The condition can make it hard to breathe. It can also cause these symptoms:  A cough.  Coughing up clear, yellow, or green mucus.  Wheezing.  Chest congestion.  Shortness of breath.  A fever.  Body aches.  Chills.  A sore throat. Follow these instructions at home:  Medicines  Take over-the-counter and prescription medicines only as told by your doctor.  If you were prescribed an antibiotic medicine, take it as told by your doctor. Do not stop taking the antibiotic even if you start to feel better. General instructions  Rest.  Drink enough fluids to keep your pee (urine) pale yellow.  Avoid smoking and secondhand smoke. If you smoke and you need help quitting, ask your doctor. Quitting will help your lungs heal faster.  Use an inhaler, cool mist vaporizer, or humidifier as told by your doctor.  Keep all follow-up visits as told by your doctor. This is important. How is this prevented? To lower your risk of getting this condition again:  Wash your hands often with soap and water. If you cannot use soap and water, use hand sanitizer.  Avoid contact with people who have cold symptoms.  Try not to touch your hands to your mouth, nose, or eyes.  Make sure to get the flu shot every year. Contact a doctor if:  Your symptoms do not get better in 2 weeks. Get help right away if:  You cough up blood.  You have chest pain.  You have very bad shortness of breath.  You become dehydrated.  You faint (pass out) or keep feeling like you are going to pass out.  You keep throwing up (vomiting).  You have a very bad headache.  Your fever or chills gets worse. This information is not intended to replace advice given to you by your health care provider. Make sure you discuss any questions you have with your health care provider. Document Released: 08/01/2007 Document  Revised: 09/26/2016 Document Reviewed: 08/03/2015 Elsevier Interactive Patient Education  2019 [employer]  

## 2018-03-11 NOTE — Discharge Summary (Signed)
[facility]  Physician Discharge Summary   Patient ID: Savannah [name] MRN: [id number] DOB/AGE: [birth year] 63 y.o.  Admit date: 03/06/2018 Discharge date: 03/11/2018  PCP: Biagio Borg, MD  DISCHARGE DIAGNOSES:  Sepsis, resolved Acute viral bronchitis, improved Chronic kidney disease stage II Acute metabolic encephalopathy, resolved Bilateral ear pain, improved Diabetes mellitus type 2 Essential hypertension Normocytic anemia History of obstructive sleep apnea   RECOMMENDATIONS FOR OUTPATIENT FOLLOW UP: 1. Patient declines outpatient physical therapy 2. Patient asked to follow-up with PCP within 1 week.   DISCHARGE CONDITION: fair  Diet recommendation: Modified carbohydrate  Filed Weights   03/07/18 1521  Weight: 106 kg    INITIAL HISTORY: 63 y.o.femalewith medical history significant ofDM2, HTN.  She presented to the emergency department with fever and altered mental status.  Recently diagnosed with upper respiratory tract infection and was started on amoxicillin.  Patient was noted to have a fever of 103, she was tachycardic, no clear source of infection.  She was hospitalized for further management.   HOSPITAL COURSE:   Sepsis, present on admission, unspecified organism Cultures negative so far.  Patient's respiratory viral panel is positive for parainfluenza virus.  Patient could have had superimposed bacterial infection as her procalcitonin level was 5.69.  Improved to 2.83.  Patient was given vancomycin and cefepime in the emergency department.  Then positioned to ceftriaxone and azithromycin.    Ceftriaxone was changed to cefdinir.  She is completed course of antibiotics.  Acute viral bronchitis Respiratory viral panel positive for parainfluenza virus.    Patient was given nebulizer treatment and steroids antibiotics as discussed above.  She slowly started improving.  Influenza PCR negative.  She feels much better this morning.  She is not  wheezing anymore.  She is ambulated.  Wants to go home.    Chronic kidney disease stage II/hypokalemia Renal function is stable at discharge.  Acute metabolic encephalopathy Most likely due to all of the above.  Mentation has improved with treatment.  CT head did not show any acute findings.  No focal neurological deficits.    Bilateral ear pain Most likely related to the above.  Examination of the ears did show dull tympanic membranes.  Mastoid effusion was noted on CT scan.    She was treated with antibiotics.  Patient reassured.  Asked to follow-up with PCP.  Essential hypertension Continue home medications.  Diabetes mellitus type 2 Continue home medications.  Normocytic anemia Drop in hemoglobin is dilutional.  No evidence for overt bleeding.  Anemia panel shows a B12 of 496, folate 11.6, ferritin 283.  History of obstructive sleep apnea Patient told to be compliant with her CPAP.  Overall stable.  Patient feels much better this morning.  Stable for discharge.    PERTINENT LABS:  The results of significant diagnostics from this hospitalization (including imaging, microbiology, ancillary and laboratory) are listed below for reference.    Microbiology: Recent Results (from the past 240 hour(s))  Blood Culture (routine x 2)     Status: None (Preliminary result)   Collection Time: 03/06/18 10:06 PM  Result Value Ref Range Status   Specimen Description   Final    BLOOD RIGHT ANTECUBITAL Performed at [facility] [address] [place], [place] [address]    Special Requests   Final    BOTTLES DRAWN AEROBIC AND ANAEROBIC Blood Culture adequate volume Performed at [facility] [address], [place],  [address]    Culture   Final  NO GROWTH 3 DAYS Performed at [facility], [place] [address], [place], [place] [address]    Report Status PENDING  Incomplete  Blood Culture (routine x 2)     Status: None  (Preliminary result)   Collection Time: 03/06/18 10:06 PM  Result Value Ref Range Status   Specimen Description   Final    BLOOD LEFT ANTECUBITAL Performed at [facility] [address] [place], [place] [address]    Special Requests   Final    BOTTLES DRAWN AEROBIC AND ANAEROBIC BACTEROIDES CACCAE Performed at [facility], [place] [address], [place], [place] [address]    Culture   Final    NO GROWTH 3 DAYS Performed at [facility], [place] [address] [place], [place] [address]    Report Status PENDING  Incomplete  Respiratory Panel by PCR     Status: Abnormal   Collection Time: 03/06/18 10:18 PM  Result Value Ref Range Status   Adenovirus NOT DETECTED NOT DETECTED Final   Coronavirus 229E NOT DETECTED NOT DETECTED Final   Coronavirus HKU1 NOT DETECTED NOT DETECTED Final   Coronavirus NL63 NOT DETECTED NOT DETECTED Final   Coronavirus OC43 NOT DETECTED NOT DETECTED Final   Metapneumovirus NOT DETECTED NOT DETECTED Final   Rhinovirus / Enterovirus NOT DETECTED NOT DETECTED Final   Influenza A NOT DETECTED NOT DETECTED Final   Influenza B NOT DETECTED NOT DETECTED Final   Parainfluenza Virus 1 DETECTED (A) NOT DETECTED Final   Parainfluenza Virus 2 NOT DETECTED NOT DETECTED Final   Parainfluenza Virus 3 NOT DETECTED NOT DETECTED Final   Parainfluenza Virus 4 NOT DETECTED NOT DETECTED Final   Respiratory Syncytial Virus NOT DETECTED NOT DETECTED Final   Bordetella pertussis NOT DETECTED NOT DETECTED Final   Chlamydophila pneumoniae NOT DETECTED NOT DETECTED Final   Mycoplasma pneumoniae NOT DETECTED NOT DETECTED Final    Comment: Performed at [facility], [place] [address] [place], [place] [address]  Urine culture     Status: None   Collection Time: 03/06/18 10:57 PM  Result Value Ref Range Status   Specimen Description URINE, CLEAN CATCH  Final   Special Requests   Final    NONE Performed at [facility] [address] [place], [place] [address]    Culture NO GROWTH  Final   Report Status 03/08/2018 FINAL  Final     Labs: Basic Metabolic Panel: Recent Labs  Lab 03/07/18 0509 03/08/18 0543 03/09/18 0431 03/10/18 0454 03/11/18 0328  NA 140 141 138 145 140  K 3.2* 3.4* 3.8 3.5 4.1  CL 105 106 107 111 110  CO2 24 24 18* 22 21*  GLUCOSE 202* 97 189* 175* 283*  BUN 24* 29* 30* 31* 31*  CREATININE 1.41* 1.48* 1.41* 1.38* 1.42*  CALCIUM 7.9* 7.4* 7.6* 7.9* 8.3*   Liver Function Tests: Recent Labs  Lab 03/06/18 2203 03/07/18 0509  AST 15 14*  ALT 12 13  ALKPHOS 44 42  BILITOT 2.4* 1.9*  PROT 7.0 6.8  ALBUMIN 3.6 3.4*   Recent Labs  Lab 03/06/18 2203  LIPASE 29   Recent Labs  Lab 03/07/18 0116  AMMONIA 28   CBC: Recent Labs  Lab 03/06/18 1151 03/06/18 2203 03/07/18 0509 03/08/18 0543 03/09/18 0431  WBC 9.0 9.9 10.4 9.3 6.8  NEUTROABS  --  8.4*  --   --   --   HGB 12.6 11.0* 10.3* 9.9* 10.0*  HCT  35.9* 32.3* 30.9* 31.3* 31.3*  MCV 87.2 88.7 90.9 94.3 92.6  PLT 185.0 152 131* 173 209   CBG: Recent Labs  Lab 03/10/18 0735 03/10/18 1143 03/10/18 1646 03/10/18 2224 03/11/18 0732  GLUCAP 169* 232* 99 211* 181*     IMAGING STUDIES Dg Chest 2 View  Result Date: 03/06/2018 CLINICAL DATA:  Fever and productive cough EXAM: CHEST - 2 VIEW COMPARISON:  04/16/2016 FINDINGS: The heart size and mediastinal contours are within normal limits. Both lungs are clear. The visualized skeletal structures are unremarkable. IMPRESSION: No active cardiopulmonary disease. Electronically Signed   By: Inez Catalina M.D.   On: 03/06/2018 15:13   Ct Head Wo Contrast  Result Date: 03/07/2018 CLINICAL DATA:  Fever. Altered mental status. EXAM: CT HEAD WITHOUT CONTRAST TECHNIQUE: Contiguous axial images were obtained from the base of the skull through the vertex without intravenous contrast. COMPARISON:  06/09/2009 FINDINGS: Brain: No evidence of acute infarction, hemorrhage,  hydrocephalus, extra-axial collection or mass lesion/mass effect. Mild ventricular dilatation similar to previous study, likely early central atrophy. Vascular: No hyperdense vessel or unexpected calcification. Skull: Calvarium appears intact. Sinuses/Orbits: Prominent mucosal thickening throughout the paranasal sinuses. No acute air-fluid levels. Left mastoid effusions. 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 IMPRESSION: No acute intracranial abnormalities. Inflammatory changes in the paranasal sinuses. Left mastoid effusions. Electronically Signed   By: Lucienne Capers M.D.   On: 03/07/2018 00:54   Ct Abdomen Pelvis W Contrast  Result Date: 03/06/2018 CLINICAL DATA:  Fever and vomiting EXAM: CT ABDOMEN AND PELVIS WITH CONTRAST TECHNIQUE: Multidetector CT imaging of the abdomen and pelvis was performed using the standard protocol following bolus administration of intravenous contrast. CONTRAST:  116mL ISOVUE-300 IOPAMIDOL (ISOVUE-300) INJECTION 61% COMPARISON:  05/17/2017 FINDINGS: Lower chest: Mild dependent atelectatic changes are noted. No focal infiltrate or sizable effusion is seen. Hepatobiliary: No focal liver abnormality is seen. No gallstones, gallbladder wall thickening, or biliary dilatation. Pancreas: Unremarkable. No pancreatic ductal dilatation or surrounding inflammatory changes. Spleen: Normal in size without focal abnormality. Adrenals/Urinary Tract: Adrenal glands are within normal limits. Left kidney is within normal limits. Right kidney demonstrates evidence of partial upper pole nephrectomy stable from the prior exam. Nonobstructing lower pole renal stones are noted on the right. The previously seen renal pelvic stones are no longer identified. The bladder is partially decompressed. No ureteral stones are seen. Stomach/Bowel: Scattered air and fecal material is noted throughout the colon. No obstructive or inflammatory changes are seen. The appendix is  within normal limits. No small bowel abnormality is noted. The stomach is within normal limits. Vascular/Lymphatic: Aortic atherosclerosis. No enlarged abdominal or pelvic lymph nodes. Reproductive: Status post hysterectomy. No adnexal masses. Other: No abdominal wall hernia or abnormality. No abdominopelvic ascites. Musculoskeletal: Degenerative changes of lumbar spine are noted. IMPRESSION: Stable nonobstructing right renal stones. Previously seen right renal pelvic stones are no longer identified. Mild dependent atelectatic changes. Postoperative changes in the upper pole of the right kidney. No acute abnormality noted. Electronically Signed   By: Inez Catalina M.D.   On: 03/06/2018 21:47   Dg Chest Port 1 View  Result Date: 03/07/2018 CLINICAL DATA:  Sepsis. EXAM: PORTABLE CHEST 1 VIEW COMPARISON:  Radiograph March 06, 2018. FINDINGS: Stable cardiomegaly. No pneumothorax or pleural effusion is noted. Both lungs are clear. The visualized skeletal structures are unremarkable. IMPRESSION: No active disease. Electronically Signed   By: Marijo Conception, M.D.   On: 03/07/2018 07:34   Dg Chest  Port 1 View  Result Date: 03/06/2018 CLINICAL DATA:  Fever.  Altered mental status. EXAM: PORTABLE CHEST 1 VIEW COMPARISON:  03/06/2018 FINDINGS: Shallow inspiration. Normal heart size and pulmonary vascularity. No focal airspace disease or consolidation in the lungs. No blunting of costophrenic angles. No pneumothorax. Mediastinal contours appear intact. IMPRESSION: No active disease. Electronically Signed   By: Lucienne Capers M.D.   On: 03/06/2018 21:28    DISCHARGE EXAMINATION: Vitals:   03/10/18 1643 03/10/18 2157 03/11/18 0532 03/11/18 1011  BP:  (!) 134/53 (!) 158/97   Pulse:  61 (!) 57   Resp:  20 18   Temp: 98.1 F (36.7 C) 99.4 F (37.4 C) 98.4 F (36.9 C)   TempSrc: Axillary Oral Oral   SpO2:  98%  96%  Weight:      Height:       General appearance: alert, cooperative, appears stated age and  no distress Resp: clear to auscultation bilaterally Cardio: regular rate and rhythm, S1, S2 normal, no murmur, click, rub or gallop GI: soft, non-tender; bowel sounds normal; no masses,  no organomegaly  DISPOSITION: Home with family  Discharge Instructions    Call MD for:  difficulty breathing, headache or visual disturbances   Complete by:  As directed    Call MD for:  extreme fatigue   Complete by:  As directed    Call MD for:  persistant dizziness or light-headedness   Complete by:  As directed    Call MD for:  persistant nausea and vomiting   Complete by:  As directed    Call MD for:  severe uncontrolled pain   Complete by:  As directed    Call MD for:  temperature >100.4   Complete by:  As directed    Diet Carb Modified   Complete by:  As directed    Discharge instructions   Complete by:  As directed    Please be sure to take all your medications as prescribed.  Please follow-up with your primary care provider in 1 week.  Ask your PCP if you change your mind regarding outpatient physical therapy.Dennis Bast were cared for by a hospitalist during your hospital stay. If you have any questions about your discharge medications or the care you received while you were in the hospital after you are discharged, you can call the unit and asked to speak with the hospitalist on call if the hospitalist that took care of you is not available. Once you are discharged, your primary care physician will handle any further medical issues. Please note that NO REFILLS for any discharge medications will be authorized once you are discharged, as it is imperative that you return to your primary care physician (or establish a relationship with a primary care physician if you do not have one) for your aftercare needs so that they can reassess your need for medications and monitor your lab values. If you do not have a primary care physician, you can call [phone] for a physician referral.   Increase activity  slowly   Complete by:  As directed         Allergies as of 03/11/2018      Reactions   Erythromycin Nausea And Vomiting      Medication List    STOP taking these medications   amoxicillin-clavulanate 875-125 MG tablet Commonly known as:  AUGMENTIN     TAKE these medications   albuterol 108 (90 Base) MCG/ACT inhaler Commonly known as:  PROAIR HFA  Inhale 2 puffs into the lungs every 4 (four) hours as needed for wheezing or shortness of breath (cough).   atorvastatin 40 MG tablet Commonly known as:  LIPITOR Take 1 tablet (40 mg total) by mouth daily.   B-12 COMPLIANCE INJECTION IJ Inject as directed every 30 (thirty) days. Notes to patient:  We did not administer this medication during your stay   cholecalciferol 1000 units tablet Commonly known as:  VITAMIN D Take 1,000 Units by mouth 2 (two) times daily.   citalopram 20 MG tablet Commonly known as:  CELEXA TAKE 1 TABLET BY MOUTH EVERY DAY--- takes in am   dextromethorphan 30 MG/5ML liquid Commonly known as:  DELSYM Take 5 mLs (30 mg total) by mouth 2 (two) times daily as needed for cough.   ferrous sulfate 325 (65 FE) MG tablet Take 325 mg by mouth daily with breakfast.   Fluticasone-Salmeterol 100-50 MCG/DOSE Aepb Commonly known as:  ADVAIR Inhale 1 puff into the lungs 2 (two) times daily.   gabapentin 300 MG capsule Commonly known as:  NEURONTIN TAKE 1 CAPSULE BY MOUTH EVERYDAY AT BEDTIME   glucose blood test strip Commonly known as:  ONETOUCH VERIO Use to check blood sugar 1 time per day.   hyoscyamine 0.125 MG SL tablet Commonly known as:  LEVSIN SL Take 1-2 tablets by mouth or under tongue every 4 hours as needed. What changed:    how much to take  how to take this  when to take this  reasons to take this  additional instructions   levothyroxine 137 MCG tablet Commonly known as:  SYNTHROID, LEVOTHROID Take 1 tablet (137 mcg total) by mouth daily before breakfast.     losartan-hydrochlorothiazide 100-12.5 MG tablet Commonly known as:  HYZAAR Take 1 tablet by mouth daily.   metFORMIN 500 MG 24 hr tablet Commonly known as:  GLUCOPHAGE-XR Take 2 tablets (1,000 mg total) by mouth 2 (two) times daily. TAKE 2 TABLETS BY MOUTH TWICE A DAY   metoprolol succinate 25 MG 24 hr tablet Commonly known as:  TOPROL-XL Take 0.5 tablets (12.5 mg total) by mouth daily.   ondansetron 4 MG disintegrating tablet Commonly known as:  ZOFRAN-ODT Take 1 tablet (4 mg total) by mouth every 8 (eight) hours as needed for nausea or vomiting.   ONETOUCH DELICA LANCETS FINE Misc Use to check blood sugar 1 time per day.   Oxycodone HCl 10 MG Tabs Take 10 mg by mouth daily as needed (pain).   pioglitazone 15 MG tablet Commonly known as:  ACTOS Take 1 tablet (15 mg total) by mouth daily.   predniSONE 20 MG tablet Commonly known as:  DELTASONE Take 3 tablets once daily for 3 days, then take 2 tablets once daily for 3 days, then take 1 tablet once daily for 3 days, then STOP.   traZODone 100 MG tablet Commonly known as:  DESYREL TAKE 1 TABLET BY MOUTH EVERYDAY AT BEDTIME   XIIDRA 5 % Soln Generic drug:  Lifitegrast Place 1 drop into both eyes 2 (two) times daily.        Follow-up Information    Biagio Borg, MD. Schedule an appointment as soon as possible for a visit in 1 week(s).   Specialties:  Internal Medicine, Radiology Contact information: [place] [address] [phone]           TOTAL DISCHARGE TIME: 59 minutes  [place]  [facility] Pager on [email or web address]  03/11/2018, 11:25 AM

## 2018-03-12 LAB — CULTURE, BLOOD (ROUTINE X 2)
CULTURE: NO GROWTH
Culture: NO GROWTH
Special Requests: ADEQUATE

## 2018-03-13 ENCOUNTER — Encounter: Payer: Self-pay | Admitting: Internal Medicine

## 2018-03-13 ENCOUNTER — Ambulatory Visit: Payer: BLUE CROSS/BLUE SHIELD | Admitting: Internal Medicine

## 2018-03-13 DIAGNOSIS — E119 Type 2 diabetes mellitus without complications: Secondary | ICD-10-CM

## 2018-03-13 DIAGNOSIS — R531 Weakness: Secondary | ICD-10-CM | POA: Insufficient documentation

## 2018-03-13 DIAGNOSIS — G9340 Encephalopathy, unspecified: Secondary | ICD-10-CM

## 2018-03-13 DIAGNOSIS — R6520 Severe sepsis without septic shock: Secondary | ICD-10-CM

## 2018-03-13 DIAGNOSIS — A419 Sepsis, unspecified organism: Secondary | ICD-10-CM

## 2018-03-13 DIAGNOSIS — G9341 Metabolic encephalopathy: Secondary | ICD-10-CM

## 2018-03-13 LAB — CULTURE BLOOD MANUAL
Micro Number: 38909
Result: NO GROWTH
Specimen Quality: ADEQUATE

## 2018-03-13 NOTE — Assessment & Plan Note (Signed)
Typical for post sepsis/infection, walks with walker, again declines referral for PT, cont to follow

## 2018-03-13 NOTE — Assessment & Plan Note (Signed)
Resolved, likely related to respiratory illness with parainfluenza, no further tx needed

## 2018-03-13 NOTE — Assessment & Plan Note (Signed)
stable overall by history and exam, recent data reviewed with pt, and pt to continue medical treatment as before,  to f/u any worsening symptoms or concerns  

## 2018-03-13 NOTE — Assessment & Plan Note (Addendum)
Resolved, likely viral related,  to f/u any worsening symptoms or concerns  Note:  Total time for pt hx, exam, review of record with pt in the room, determination of diagnoses and plan for further eval and tx is > 40 min, with over 50% spent in coordination and counseling of patient including the differential dx, tx, further evaluation and other management of sepsis, respiratory infection, general weakness, DM and TME

## 2018-03-13 NOTE — Progress Notes (Signed)
Subjective:    Patient ID: Savannah [name], female    DOB: June 23, 1955, 63 y.o.   MRN: [id number]  HPI  Here to f/u recent hospn - 1/9 - 1/14, now home for 2 days, after episode of parainfluenza with sepsis, likely contracted from ill husband of 42 yrs prior to that (he is out of state trucker)  She presented to the emergency department with fever and altered mental status. Recently diagnosed with upper respiratory tract infection and was started on amoxicillin. Patient was noted to have a fever of 103, she was tachycardic, no clear source of infection. She was hospitalized for further management. Cultures negative Patient's respiratory viral panel is positive for parainfluenza virus. Patient could have had superimposed bacterial infection as her procalcitonin level was 5.69. Improved to 2.83. Patient was given vancomycin and cefepime in the emergency department. Then positioned to ceftriaxone and azithromycin.   Ceftriaxone was changed to cefdinir.  She is completed course of antibiotics.Acute metabolic encephalopathy - Most likely due to all of the above. Mentation has improved with treatment. CT head did not show any acute findings Was recommended for PT but she herself declined.  Has been to PT with her mother several times,and copays too much.   Today c/o lack of stamina, o/w no fever, worsening cough or sob, CP, abd pain ro dysuria.  No new complaints Past Medical History:  Diagnosis Date  . Allergic rhinitis   . Asthma   . B12 deficiency   . Depression   . Grade I internal hemorrhoids   . Hiatal hernia   . History of colon polyps   . History of kidney stones   . 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 Hypercholesterolemia   . Hypertension   . Hypothyroidism    endocrinologist -  dr Hale Bogus  . Iron deficiency anemia   . Lumbar  spondylosis   . NAFLD (nonalcoholic fatty liver disease)    dx 2010--- last hepatic panel in epic dated 04-17-2017  . Nocturia   . OA (osteoarthritis of spine)    Lumbar  . OSA on CPAP pulmologist-  dr sood   sleep study 09-11-2004  very severe osa , AHI 119/hr,  setting 10  . Peripheral neuropathy    bottom of feet  . Renal calculus, right   . Type 2 diabetes mellitus ([place])    endocrinoloigst-  dr Loanne Drilling,  last A1c 5.9 on 02-20-219 in epic  . Vitamin B 12 deficiency   . Wears glasses    Past Surgical History:  Procedure Laterality Date  . BUNIONECTOMY/  HAMMERTOE CORRECTION'S  09-07-2013   SCG   RIGHT FOOT 2ND, 3RD, 4TH, 5TH DIGITS  . CARDIAC CATHETERIZATION  06-01-2009  dr Angelena Form   no evidence CAD,  normal LVSF, elevated blood pressure w/ elevated end-diastolic pressure, ef 60-73%  . CARDIOVASCULAR STRESS TEST  05-11-2009   dr Angelena Form   abnormal lexiscan nuclear study (no exercise) w/ mild ischemia in the distal anteroseptal wall and apex, this defect may be due to shifting breast attenuation  . COLONOSCOPY WITH SNARE POLYPECTOMY WITH ESOPHAGOGASTRODUODENOSCOPY  02/08/2014   with biopsy Dr. Jerilynn Mages. [facility] Plan  . CYSTOSCOPY WITH RETROGRADE PYELOGRAM, URETEROSCOPY AND STENT PLACEMENT Right 06/07/2017   Procedure: CYSTOSCOPY WITH RETROGRADE PYELOGRAM, URETEROSCOPY, LITHOTRIPSY,  AND STENT PLACEMENT 1st STAGE;  Surgeon: Alexis Frock, MD;  Location:  [facility];  Service: Urology;  Laterality: Right;  . CYSTOSCOPY WITH RETROGRADE PYELOGRAM, URETEROSCOPY AND STENT PLACEMENT Right 06/21/2017   Procedure: CYSTOSCOPY WITH RETROGRADE PYELOGRAM, URETEROSCOPY AND STENT PLACEMENT 2ND STAGE  STENT" EXCHANGE";  Surgeon: Alexis Frock, MD;  Location: [facility];  Service: Urology;  Laterality: Right;  . FRACTURE SURGERY Right    Compound fracture foot  . HOLMIUM LASER APPLICATION Right 03/06/3233   Procedure: HOLMIUM LASER APPLICATION;  Surgeon: Alexis Frock, MD;   Location: [facility];  Service: Urology;  Laterality: Right;  . HOLMIUM LASER APPLICATION Right 5/73/2202   Procedure: HOLMIUM LASER APPLICATION;  Surgeon: Alexis Frock, MD;  Location: [facility];  Service: Urology;  Laterality: Right;  . KNEE ARTHROSCOPY Left 1993  . ROBOTIC ASSITED PARTIAL NEPHRECTOMY Right 05/19/2014   Procedure: ROBOTIC ASSITED PARTIAL NEPHRECTOMY;  Surgeon: Alexis Frock, MD;  Location: [facility];  Service: Urology;  Laterality: Right;    reports that she quit smoking about 43 years ago. Her smoking use included cigarettes. She has a 0.60 pack-year smoking history. She has never used smokeless tobacco. She reports that she does not drink alcohol or use drugs. family history includes Allergies in her sister; Asthma in her sister; Clotting disorder in her grandchild; Colon cancer (age of onset: 68) in her mother; Diabetes in her mother; Heart attack in her father; Heart disease in her father and paternal grandmother; Kidney disease in her mother. Allergies  Allergen Reactions  . Erythromycin Nausea And Vomiting   Current Outpatient Medications on File Prior to Visit  Medication Sig Dispense Refill  . albuterol (PROAIR HFA) 108 (90 Base) MCG/ACT inhaler Inhale 2 puffs into the lungs every 4 (four) hours as needed for wheezing or shortness of breath (cough). 3 Inhaler 3  . atorvastatin (LIPITOR) 40 MG tablet Take 1 tablet (40 mg total) by mouth daily. 90 tablet 3  . cholecalciferol (VITAMIN D) 1000 units tablet Take 1,000 Units by mouth 2 (two) times daily.    . citalopram (CELEXA) 20 MG tablet TAKE 1 TABLET BY MOUTH EVERY DAY--- takes in am 90 tablet 3  . Cyanocobalamin (B-12 COMPLIANCE INJECTION IJ) Inject as directed every 30 (thirty) days.    Marland Kitchen dextromethorphan (DELSYM) 30 MG/5ML liquid Take 5 mLs (30 mg total) by mouth 2 (two) times daily as needed for cough. 89 mL 0  . ferrous sulfate 325 (65 FE) MG tablet Take 325 mg by mouth daily with  breakfast.    . Fluticasone-Salmeterol (ADVAIR) 100-50 MCG/DOSE AEPB Inhale 1 puff into the lungs 2 (two) times daily. 3 each 3  . gabapentin (NEURONTIN) 300 MG capsule TAKE 1 CAPSULE BY MOUTH EVERYDAY AT BEDTIME 90 capsule 3  . glucose blood (ONETOUCH VERIO) test strip Use to check blood sugar 1 time per day. 100 each 3  . hyoscyamine (LEVSIN SL) 0.125 MG SL tablet Take 1-2 tablets by mouth or under tongue every 4 hours as needed. (Patient taking differently: Take 0.125-0.25 mg by mouth every 4 (four) hours as needed for cramping (stomach spasms). ) 100 tablet 5  . levothyroxine (SYNTHROID, LEVOTHROID) 137 MCG tablet Take 1 tablet (137 mcg total) by mouth daily before breakfast. 90 tablet 3  . losartan-hydrochlorothiazide (HYZAAR) 100-12.5 MG tablet Take 1 tablet by mouth daily. 90 tablet 3  . metFORMIN (GLUCOPHAGE-XR) 500 MG 24 hr tablet Take 2 tablets (1,000 mg total) by mouth 2 (two) times daily. TAKE 2 TABLETS BY MOUTH TWICE A DAY 360 tablet 3  .  metoprolol succinate (TOPROL-XL) 25 MG 24 hr tablet Take 0.5 tablets (12.5 mg total) by mouth daily. 45 tablet 3  . ondansetron (ZOFRAN-ODT) 4 MG disintegrating tablet Take 1 tablet (4 mg total) by mouth every 8 (eight) hours as needed for nausea or vomiting. 20 tablet 0  . ONETOUCH DELICA LANCETS FINE MISC Use to check blood sugar 1 time per day. 100 each 2  . Oxycodone HCl 10 MG TABS Take 10 mg by mouth daily as needed (pain).   0  . pioglitazone (ACTOS) 15 MG tablet Take 1 tablet (15 mg total) by mouth daily. 90 tablet 3  . predniSONE (DELTASONE) 20 MG tablet Take 3 tablets once daily for 3 days, then take 2 tablets once daily for 3 days, then take 1 tablet once daily for 3 days, then STOP. 18 tablet 0  . traZODone (DESYREL) 100 MG tablet TAKE 1 TABLET BY MOUTH EVERYDAY AT BEDTIME 90 tablet 1  . XIIDRA 5 % SOLN Place 1 drop into both eyes 2 (two) times daily.  3   No current facility-administered medications on file prior to visit.    Review of  Systems  Constitutional: Negative for other unusual diaphoresis or sweats HENT: Negative for ear discharge or swelling Eyes: Negative for other worsening visual disturbances Respiratory: Negative for stridor or other swelling  Gastrointestinal: Negative for worsening distension or other blood Genitourinary: Negative for retention or other urinary change Musculoskeletal: Negative for other MSK pain or swelling Skin: Negative for color change or other new lesions Neurological: Negative for worsening tremors and other numbness  Psychiatric/Behavioral: Negative for worsening agitation or other fatigue All other system neg per pt    Objective:   Physical Exam BP 126/78   Pulse 68   Temp 98 F (36.7 C) (Oral)   Ht 5\' 8"  (1.727 m)   Wt 241 lb (109.3 kg)   SpO2 95%   BMI 36.64 kg/m  VS noted,  Constitutional: Pt appears in NAD HENT: Head: NCAT.  Right Ear: External ear normal.  Left Ear: External ear normal.  Eyes: . Pupils are equal, round, and reactive to light. Conjunctivae and EOM are normal Nose: without d/c or deformity Neck: Neck supple. Gross normal ROM Cardiovascular: Normal rate and regular rhythm.   Pulmonary/Chest: Effort normal and breath sounds without rales or wheezing.  Abd:  Soft, NT, ND, + BS, no organomegaly Neurological: Pt is alert. At baseline orientation, motor grossly intact Skin: Skin is warm. No rashes, other new lesions, no LE edema Psychiatric: Pt behavior is normal without agitation  No other exam findings Lab Results  Component Value Date   WBC 6.8 03/09/2018   HGB 10.0 (L) 03/09/2018   HCT 31.3 (L) 03/09/2018   PLT 209 03/09/2018   GLUCOSE 283 (H) 03/11/2018   CHOL 135 12/02/2017   TRIG 159.0 (H) 12/02/2017   HDL 51.90 12/02/2017   LDLDIRECT 88.0 01/14/2015   LDLCALC 51 12/02/2017   ALT 13 03/07/2018   AST 14 (L) 03/07/2018   NA 140 03/11/2018   K 4.1 03/11/2018   CL 110 03/11/2018   CREATININE 1.42 (H) 03/11/2018   BUN 31 (H) 03/11/2018    CO2 21 (L) 03/11/2018   TSH 2.494 03/07/2018   INR 1.1 ratio (H) 05/23/2009   HGBA1C 5.6 10/15/2017   MICROALBUR 4.0 (H) 04/17/2017       Assessment & Plan:

## 2018-03-13 NOTE — Patient Instructions (Signed)
Please continue all other medications as before, and refills have been done if requested.  You can also take Mucinex (or it's generic off brand) for congestion, and tylenol as needed for pain.  Please have the pharmacy call with any other refills you may need.  Please keep your appointments with your specialists as you may have planned  Please call if you change your mind about the referral for outpatient Physical Therapy

## 2018-03-31 ENCOUNTER — Ambulatory Visit (INDEPENDENT_AMBULATORY_CARE_PROVIDER_SITE_OTHER): Payer: BLUE CROSS/BLUE SHIELD

## 2018-03-31 DIAGNOSIS — E538 Deficiency of other specified B group vitamins: Secondary | ICD-10-CM

## 2018-03-31 MED ORDER — CYANOCOBALAMIN 1000 MCG/ML IJ SOLN
1000.0000 ug | Freq: Once | INTRAMUSCULAR | Status: AC
  Administered 2018-03-31: 1000 ug via INTRAMUSCULAR

## 2018-03-31 NOTE — Progress Notes (Signed)
Medical screening examination/treatment/procedure(s) were performed by non-physician practitioner and as supervising physician I was immediately available for consultation/collaboration. I agree with above. James John, MD   

## 2018-04-29 ENCOUNTER — Ambulatory Visit (INDEPENDENT_AMBULATORY_CARE_PROVIDER_SITE_OTHER): Payer: BLUE CROSS/BLUE SHIELD

## 2018-04-29 DIAGNOSIS — E538 Deficiency of other specified B group vitamins: Secondary | ICD-10-CM | POA: Diagnosis not present

## 2018-04-29 MED ORDER — CYANOCOBALAMIN 1000 MCG/ML IJ SOLN
1000.0000 ug | Freq: Once | INTRAMUSCULAR | Status: AC
  Administered 2018-04-29: 1000 ug via INTRAMUSCULAR

## 2018-04-29 NOTE — Progress Notes (Signed)
Patient ID: Savannah [name], female   DOB: 1955/08/29, 63 y.o.   MRN: [id number]  Medical screening examination/treatment/procedure(s) were performed by non-physician practitioner and as supervising physician I was immediately available for consultation/collaboration. I agree with above. Cathlean Cower, MD

## 2018-05-12 LAB — HM DIABETES EYE EXAM

## 2018-06-03 ENCOUNTER — Ambulatory Visit: Payer: BLUE CROSS/BLUE SHIELD | Admitting: Internal Medicine

## 2018-07-27 ENCOUNTER — Other Ambulatory Visit: Payer: Self-pay | Admitting: Internal Medicine

## 2018-07-28 ENCOUNTER — Telehealth: Payer: Self-pay | Admitting: Internal Medicine

## 2018-07-28 NOTE — Telephone Encounter (Signed)
Done erx 

## 2018-07-28 NOTE — Telephone Encounter (Signed)
Routing to dr john, please advise, thanks 

## 2018-07-28 NOTE — Telephone Encounter (Signed)
Patient would like lab work ordered for her appt on 7.30. please advise

## 2018-09-11 ENCOUNTER — Telehealth: Payer: Self-pay | Admitting: Internal Medicine

## 2018-09-11 ENCOUNTER — Encounter: Payer: Self-pay | Admitting: Internal Medicine

## 2018-09-11 NOTE — Telephone Encounter (Signed)
error:[id number] ° °

## 2018-09-11 NOTE — Telephone Encounter (Signed)
Patient requesting full panel lab orders prior to 09/25/2018 follow up appointment, patient states PCP likes to discuss orders prior to appointment.

## 2018-09-15 ENCOUNTER — Other Ambulatory Visit: Payer: Self-pay | Admitting: Internal Medicine

## 2018-09-15 DIAGNOSIS — E119 Type 2 diabetes mellitus without complications: Secondary | ICD-10-CM

## 2018-09-15 DIAGNOSIS — Z0000 Encounter for general adult medical examination without abnormal findings: Secondary | ICD-10-CM

## 2018-09-15 DIAGNOSIS — E611 Iron deficiency: Secondary | ICD-10-CM

## 2018-09-15 DIAGNOSIS — E538 Deficiency of other specified B group vitamins: Secondary | ICD-10-CM

## 2018-09-15 DIAGNOSIS — E559 Vitamin D deficiency, unspecified: Secondary | ICD-10-CM

## 2018-09-15 NOTE — Telephone Encounter (Signed)
Please advise. Im not sure what lab work you would like ordered since her CPE was in Oct 2019. Thanks!

## 2018-09-15 NOTE — Telephone Encounter (Signed)
Labs are ordered 

## 2018-09-22 ENCOUNTER — Other Ambulatory Visit (INDEPENDENT_AMBULATORY_CARE_PROVIDER_SITE_OTHER): Payer: BC Managed Care – PPO

## 2018-09-22 DIAGNOSIS — E559 Vitamin D deficiency, unspecified: Secondary | ICD-10-CM | POA: Diagnosis not present

## 2018-09-22 DIAGNOSIS — Z0000 Encounter for general adult medical examination without abnormal findings: Secondary | ICD-10-CM

## 2018-09-22 DIAGNOSIS — E119 Type 2 diabetes mellitus without complications: Secondary | ICD-10-CM

## 2018-09-22 DIAGNOSIS — E611 Iron deficiency: Secondary | ICD-10-CM | POA: Diagnosis not present

## 2018-09-22 DIAGNOSIS — E538 Deficiency of other specified B group vitamins: Secondary | ICD-10-CM

## 2018-09-22 LAB — CBC WITH DIFFERENTIAL/PLATELET
Basophils Absolute: 0.1 10*3/uL (ref 0.0–0.1)
Basophils Relative: 1 % (ref 0.0–3.0)
Eosinophils Absolute: 0.2 10*3/uL (ref 0.0–0.7)
Eosinophils Relative: 3.1 % (ref 0.0–5.0)
HCT: 38.7 % (ref 36.0–46.0)
Hemoglobin: 13 g/dL (ref 12.0–15.0)
Lymphocytes Relative: 25.6 % (ref 12.0–46.0)
Lymphs Abs: 2.1 10*3/uL (ref 0.7–4.0)
MCHC: 33.6 g/dL (ref 30.0–36.0)
MCV: 88.3 fl (ref 78.0–100.0)
Monocytes Absolute: 0.4 10*3/uL (ref 0.1–1.0)
Monocytes Relative: 5.3 % (ref 3.0–12.0)
Neutro Abs: 5.3 10*3/uL (ref 1.4–7.7)
Neutrophils Relative %: 65 % (ref 43.0–77.0)
Platelets: 231 10*3/uL (ref 150.0–400.0)
RBC: 4.38 Mil/uL (ref 3.87–5.11)
RDW: 14.6 % (ref 11.5–15.5)
WBC: 8.1 10*3/uL (ref 4.0–10.5)

## 2018-09-22 LAB — URINALYSIS, ROUTINE W REFLEX MICROSCOPIC
Bilirubin Urine: NEGATIVE
Hgb urine dipstick: NEGATIVE
Ketones, ur: NEGATIVE
Nitrite: NEGATIVE
Specific Gravity, Urine: 1.025 (ref 1.000–1.030)
Total Protein, Urine: NEGATIVE
Urine Glucose: NEGATIVE
Urobilinogen, UA: 1 (ref 0.0–1.0)
pH: 5.5 (ref 5.0–8.0)

## 2018-09-22 LAB — MICROALBUMIN / CREATININE URINE RATIO
Creatinine,U: 168.4 mg/dL
Microalb Creat Ratio: 2.8 mg/g (ref 0.0–30.0)
Microalb, Ur: 4.7 mg/dL — ABNORMAL HIGH (ref 0.0–1.9)

## 2018-09-22 LAB — HEMOGLOBIN A1C: Hgb A1c MFr Bld: 5.8 % (ref 4.6–6.5)

## 2018-09-22 LAB — HEPATIC FUNCTION PANEL
ALT: 17 U/L (ref 0–35)
AST: 18 U/L (ref 0–37)
Albumin: 4.8 g/dL (ref 3.5–5.2)
Alkaline Phosphatase: 59 U/L (ref 39–117)
Bilirubin, Direct: 0.3 mg/dL (ref 0.0–0.3)
Total Bilirubin: 2 mg/dL — ABNORMAL HIGH (ref 0.2–1.2)
Total Protein: 7.4 g/dL (ref 6.0–8.3)

## 2018-09-22 LAB — BASIC METABOLIC PANEL
BUN: 21 mg/dL (ref 6–23)
CO2: 27 mEq/L (ref 19–32)
Calcium: 10.2 mg/dL (ref 8.4–10.5)
Chloride: 101 mEq/L (ref 96–112)
Creatinine, Ser: 1.21 mg/dL — ABNORMAL HIGH (ref 0.40–1.20)
GFR: 44.78 mL/min — ABNORMAL LOW (ref >60.00)
Glucose, Bld: 133 mg/dL — ABNORMAL HIGH (ref 70–99)
Potassium: 4 mEq/L (ref 3.5–5.1)
Sodium: 140 mEq/L (ref 135–145)

## 2018-09-22 LAB — IBC PANEL
Iron: 110 ug/dL (ref 42–145)
Saturation Ratios: 25.1 % (ref 20.0–50.0)
Transferrin: 313 mg/dL (ref 212.0–360.0)

## 2018-09-22 LAB — TSH: TSH: 2.32 u[IU]/mL (ref 0.35–4.50)

## 2018-09-22 LAB — VITAMIN B12: Vitamin B-12: 1284 pg/mL — ABNORMAL HIGH (ref 211–911)

## 2018-09-22 LAB — LIPID PANEL
Cholesterol: 139 mg/dL (ref 0–200)
HDL: 54.9 mg/dL (ref >39.00)
LDL Cholesterol: 45 mg/dL (ref 0–99)
NonHDL: 84.52
Total CHOL/HDL Ratio: 3
Triglycerides: 198 mg/dL — ABNORMAL HIGH (ref 0.0–149.0)
VLDL: 39.6 mg/dL (ref 0.0–40.0)

## 2018-09-22 LAB — VITAMIN D 25 HYDROXY (VIT D DEFICIENCY, FRACTURES): VITD: 83.55 ng/mL (ref 30.00–100.00)

## 2018-09-25 ENCOUNTER — Encounter: Payer: Self-pay | Admitting: Internal Medicine

## 2018-09-25 ENCOUNTER — Ambulatory Visit (INDEPENDENT_AMBULATORY_CARE_PROVIDER_SITE_OTHER): Payer: BC Managed Care – PPO | Admitting: Internal Medicine

## 2018-09-25 VITALS — BP 128/86 | HR 81 | Temp 98.3°F | Ht 68.0 in | Wt 233.0 lb

## 2018-09-25 DIAGNOSIS — I10 Essential (primary) hypertension: Secondary | ICD-10-CM | POA: Diagnosis not present

## 2018-09-25 DIAGNOSIS — M25512 Pain in left shoulder: Secondary | ICD-10-CM | POA: Diagnosis not present

## 2018-09-25 DIAGNOSIS — E119 Type 2 diabetes mellitus without complications: Secondary | ICD-10-CM | POA: Diagnosis not present

## 2018-09-25 DIAGNOSIS — Z0000 Encounter for general adult medical examination without abnormal findings: Secondary | ICD-10-CM | POA: Diagnosis not present

## 2018-09-25 DIAGNOSIS — Z0001 Encounter for general adult medical examination with abnormal findings: Secondary | ICD-10-CM

## 2018-09-25 MED ORDER — METFORMIN HCL 500 MG PO TABS: 1000.0000 mg | ORAL_TABLET | Freq: Two times a day (BID) | ORAL | 3 refills | Status: DC

## 2018-09-25 NOTE — Progress Notes (Signed)
Subjective:    Patient ID: Savannah [name], female    DOB: 11/25/1955, 63 y.o.   MRN: [id number]  HPI  Here for wellness and f/u;  Overall doing ok;  Pt denies Chest pain, worsening SOB, DOE, wheezing, orthopnea, PND, worsening LE edema, palpitations, dizziness or syncope.  Pt denies neurological change such as new headache, facial or extremity weakness.  Pt denies polydipsia, polyuria, or low sugar symptoms. Pt states overall good compliance with treatment and medications, good tolerability, and has been trying to follow appropriate diet.  Pt denies worsening depressive symptoms, suicidal ideation or panic. No fever, night sweats, wt loss, loss of appetite, or other constitutional symptoms.  Pt states good ability with ADL's, has low fall risk, home safety reviewed and adequate, no other significant changes in hearing or vision, and only occasionally active with exercise. Due for colonoscopy dec 2020.   Wt Readings from Last 3 Encounters:  09/25/18 233 lb (105.7 kg)  03/13/18 241 lb (109.3 kg)  03/07/18 233 lb 11 oz (106 kg)  Also c/o 1 month gradually worsening left shoulder pain, sharp, moderate, intermittent, worse to abduct and forward elevated, seems to prior bursitis tx with cortisone per dr smith/[facility] Past Medical History:  Diagnosis Date  . Allergic rhinitis   . Asthma   . B12 deficiency   . Depression   . Grade I internal hemorrhoids   . Hiatal hernia   . History of colon polyps   . History of kidney stones   . 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 Hypercholesterolemia   . Hypertension   . Hypothyroidism    endocrinologist -  dr Hale Bogus  . Iron deficiency anemia   . Lumbar spondylosis   . NAFLD (nonalcoholic fatty liver disease)    dx 2010--- last hepatic panel in epic dated 04-17-2017  . Nocturia   . OA  (osteoarthritis of spine)    Lumbar  . OSA on CPAP pulmologist-  dr sood   sleep study 09-11-2004  very severe osa , AHI 119/hr,  setting 10  . Peripheral neuropathy    bottom of feet  . Renal calculus, right   . Type 2 diabetes mellitus ([place])    endocrinoloigst-  dr Loanne Drilling,  last A1c 5.9 on 02-20-219 in epic  . Vitamin B 12 deficiency   . Wears glasses    Past Surgical History:  Procedure Laterality Date  . BUNIONECTOMY/  HAMMERTOE CORRECTION'S  09-07-2013   SCG   RIGHT FOOT 2ND, 3RD, 4TH, 5TH DIGITS  . CARDIAC CATHETERIZATION  06-01-2009  dr Angelena Form   no evidence CAD,  normal LVSF, elevated blood pressure w/ elevated end-diastolic pressure, ef 17-00%  . CARDIOVASCULAR STRESS TEST  05-11-2009   dr Angelena Form   abnormal lexiscan nuclear study (no exercise) w/ mild ischemia in the distal anteroseptal wall and apex, this defect may be due to shifting breast attenuation  . COLONOSCOPY WITH SNARE POLYPECTOMY WITH ESOPHAGOGASTRODUODENOSCOPY  02/08/2014   with biopsy Dr. Jerilynn Mages. Fuller Plan  . CYSTOSCOPY WITH RETROGRADE PYELOGRAM, URETEROSCOPY AND STENT PLACEMENT Right 06/07/2017   Procedure: CYSTOSCOPY WITH RETROGRADE PYELOGRAM, URETEROSCOPY, LITHOTRIPSY,  AND STENT PLACEMENT 1st STAGE;  Surgeon: Alexis Frock, MD;  Location: [facility];  Service: Urology;  Laterality: Right;  . CYSTOSCOPY WITH RETROGRADE PYELOGRAM, URETEROSCOPY AND STENT PLACEMENT Right 06/21/2017   Procedure: CYSTOSCOPY  WITH RETROGRADE PYELOGRAM, URETEROSCOPY AND STENT PLACEMENT 2ND STAGE  STENT" EXCHANGE";  Surgeon: Alexis Frock, MD;  Location: [facility];  Service: Urology;  Laterality: Right;  . FRACTURE SURGERY Right    Compound fracture foot  . HOLMIUM LASER APPLICATION Right 2/97/9892   Procedure: HOLMIUM LASER APPLICATION;  Surgeon: Alexis Frock, MD;  Location: [facility];  Service: Urology;  Laterality: Right;  . HOLMIUM LASER APPLICATION Right 03/16/4172   Procedure:  HOLMIUM LASER APPLICATION;  Surgeon: Alexis Frock, MD;  Location: [facility];  Service: Urology;  Laterality: Right;  . KNEE ARTHROSCOPY Left 1993  . ROBOTIC ASSITED PARTIAL NEPHRECTOMY Right 05/19/2014   Procedure: ROBOTIC ASSITED PARTIAL NEPHRECTOMY;  Surgeon: Alexis Frock, MD;  Location: [facility];  Service: Urology;  Laterality: Right;    reports that she quit smoking about 43 years ago. Her smoking use included cigarettes. She has a 0.60 pack-year smoking history. She has never used smokeless tobacco. She reports that she does not drink alcohol or use drugs. family history includes Allergies in her sister; Asthma in her sister; Clotting disorder in her grandchild; Colon cancer (age of onset: 86) in her mother; Diabetes in her mother; Heart attack in her father; Heart disease in her father and paternal grandmother; Kidney disease in her mother. Allergies  Allergen Reactions  . Erythromycin Nausea And Vomiting   Current Outpatient Medications on File Prior to Visit  Medication Sig Dispense Refill  . albuterol (PROAIR HFA) 108 (90 Base) MCG/ACT inhaler Inhale 2 puffs into the lungs every 4 (four) hours as needed for wheezing or shortness of breath (cough). 3 Inhaler 3  . atorvastatin (LIPITOR) 40 MG tablet Take 1 tablet (40 mg total) by mouth daily. 90 tablet 3  . cholecalciferol (VITAMIN D) 1000 units tablet Take 1,000 Units by mouth 2 (two) times daily.    . citalopram (CELEXA) 20 MG tablet TAKE 1 TABLET BY MOUTH EVERY DAY--- takes in am 90 tablet 3  . Cyanocobalamin (B-12 COMPLIANCE INJECTION IJ) Inject as directed every 30 (thirty) days.    Marland Kitchen dextromethorphan (DELSYM) 30 MG/5ML liquid Take 5 mLs (30 mg total) by mouth 2 (two) times daily as needed for cough. 89 mL 0  . ferrous sulfate 325 (65 FE) MG tablet Take 325 mg by mouth daily with breakfast.    . Fluticasone-Salmeterol (ADVAIR) 100-50 MCG/DOSE AEPB Inhale 1 puff into the lungs 2 (two) times daily. 3 each 3  .  gabapentin (NEURONTIN) 300 MG capsule TAKE 1 CAPSULE BY MOUTH EVERYDAY AT BEDTIME 90 capsule 3  . glucose blood (ONETOUCH VERIO) test strip Use to check blood sugar 1 time per day. 100 each 3  . hyoscyamine (LEVSIN SL) 0.125 MG SL tablet Take 1-2 tablets by mouth or under tongue every 4 hours as needed. (Patient taking differently: Take 0.125-0.25 mg by mouth every 4 (four) hours as needed for cramping (stomach spasms). ) 100 tablet 5  . levothyroxine (SYNTHROID, LEVOTHROID) 137 MCG tablet Take 1 tablet (137 mcg total) by mouth daily before breakfast. 90 tablet 3  . losartan-hydrochlorothiazide (HYZAAR) 100-12.5 MG tablet Take 1 tablet by mouth daily. 90 tablet 3  . metoprolol succinate (TOPROL-XL) 25 MG 24 hr tablet Take 0.5 tablets (12.5 mg total) by mouth daily. 45 tablet 3  . ondansetron (ZOFRAN-ODT) 4 MG disintegrating tablet Take 1 tablet (4 mg total) by mouth every 8 (eight) hours as needed for nausea or vomiting. 20 tablet 0  . [place]  Use to check blood sugar 1 time per day. 100 each 2  . Oxycodone HCl 10 MG TABS Take 10 mg by mouth daily as needed (pain).   0  . pioglitazone (ACTOS) 15 MG tablet Take 1 tablet (15 mg total) by mouth daily. 90 tablet 3  . predniSONE (DELTASONE) 20 MG tablet Take 3 tablets once daily for 3 days, then take 2 tablets once daily for 3 days, then take 1 tablet once daily for 3 days, then STOP. 18 tablet 0  . traZODone (DESYREL) 100 MG tablet TAKE 1 TABLET BY MOUTH EVERYDAY AT BEDTIME 90 tablet 1  . XIIDRA 5 % SOLN Place 1 drop into both eyes 2 (two) times daily.  3   No current facility-administered medications on file prior to visit.    Review of Systems Constitutional: Negative for other unusual diaphoresis, sweats, appetite or weight changes HENT: Negative for other worsening hearing loss, ear pain, facial swelling, mouth sores or neck stiffness.   Eyes: Negative for other worsening pain, redness or other visual disturbance.   Respiratory: Negative for other stridor or swelling Cardiovascular: Negative for other palpitations or other chest pain  Gastrointestinal: Negative for worsening diarrhea or loose stools, blood in stool, distention or other pain Genitourinary: Negative for hematuria, flank pain or other change in urine volume.  Musculoskeletal: Negative for myalgias or other joint swelling.  Skin: Negative for other color change, or other wound or worsening drainage.  Neurological: Negative for other syncope or numbness. Hematological: Negative for other adenopathy or swelling Psychiatric/Behavioral: Negative for hallucinations, other worsening agitation, SI, self-injury, or new decreased concentration All other system neg per pt    Objective:   Physical Exam BP 128/86   Pulse 81   Temp 98.3 F (36.8 C) (Oral)   Ht 5\' 8"  (1.727 m)   Wt 233 lb (105.7 kg)   SpO2 96%   BMI 35.43 kg/m  VS noted,  Constitutional: Pt is oriented to person, place, and time. Appears well-developed and well-nourished, in no significant distress and comfortable Head: Normocephalic and atraumatic  Eyes: Conjunctivae and EOM are normal. Pupils are equal, round, and reactive to light Right Ear: External ear normal without discharge Left Ear: External ear normal without discharge Nose: Nose without discharge or deformity Mouth/Throat: Oropharynx is without other ulcerations and moist  Neck: Normal range of motion. Neck supple. No JVD present. No tracheal deviation present or significant neck LA or mass Cardiovascular: Normal rate, regular rhythm, normal heart sounds and intact distal pulses.   Pulmonary/Chest: WOB normal and breath sounds without rales or wheezing  Abdominal: Soft. Bowel sounds are normal. NT. No HSM  Musculoskeletal: Normal range of motion. Exhibits no edema except for tender to left subacromial area worse to abduct and forward elevated to 90 degrees only Lymphadenopathy: Has no other cervical adenopathy.   Neurological: Pt is alert and oriented to person, place, and time. Pt has normal reflexes. No cranial nerve deficit. Motor grossly intact, Gait intact Skin: Skin is warm and dry. No rash noted or new ulcerations Psychiatric:  Has normal mood and affect. Behavior is normal without agitation No other exam findings Lab Results  Component Value Date   WBC 8.1 09/22/2018   HGB 13.0 09/22/2018   HCT 38.7 09/22/2018   PLT 231.0 09/22/2018   GLUCOSE 133 (H) 09/22/2018   CHOL 139 09/22/2018   TRIG 198.0 (H) 09/22/2018   HDL 54.90 09/22/2018   LDLDIRECT 88.0 01/14/2015   LDLCALC 45 09/22/2018   ALT  17 09/22/2018   AST 18 09/22/2018   NA 140 09/22/2018   K 4.0 09/22/2018   CL 101 09/22/2018   CREATININE 1.21 (H) 09/22/2018   BUN 21 09/22/2018   CO2 27 09/22/2018   TSH 2.32 09/22/2018   INR 1.1 ratio (H) 05/23/2009   HGBA1C 5.8 09/22/2018   MICROALBUR 4.7 (H) 09/22/2018        Assessment & Plan:

## 2018-09-25 NOTE — Assessment & Plan Note (Signed)

## 2018-09-25 NOTE — Assessment & Plan Note (Signed)
stable overall by history and exam, recent data reviewed with pt, and pt to continue medical treatment as before,  to f/u any worsening symptoms or concerns  

## 2018-09-25 NOTE — Assessment & Plan Note (Addendum)
Exam c/w impingement syndrome, for referal to [facility] for likely cortisone  In addition to the time spent performing CPE, I spent an additional 15 minutes face to face,in which greater than 50% of this time was spent in counseling and coordination of care for patient's illness as documented, including the differential dx, treatment, further evaluation and other management of left shoulder pain, DM, HTN

## 2018-09-25 NOTE — Patient Instructions (Signed)
Please continue all other medications as before, and refills have been done if requested.  Please have the pharmacy call with any other refills you may need.  Please continue your efforts at being more active, low cholesterol diet, and weight control.  You are otherwise up to date with prevention measures today.  Please keep your appointments with your specialists as you may have planned - Dr Camie Patience will be contacted regarding the referral for: colonoscopy dec 2020  You will be contacted regarding the referral for: Dr Tamala Julian for the left shoulder  Please return in 6 months, or sooner if needed, with Lab testing done 3-5 days before

## 2018-10-31 ENCOUNTER — Ambulatory Visit: Payer: BC Managed Care – PPO | Admitting: Family Medicine

## 2018-10-31 ENCOUNTER — Ambulatory Visit (INDEPENDENT_AMBULATORY_CARE_PROVIDER_SITE_OTHER)
Admission: RE | Admit: 2018-10-31 | Discharge: 2018-10-31 | Disposition: A | Discharge status: Home / Self Care | Payer: BC Managed Care – PPO | Source: Ambulatory Visit | Attending: Family Medicine | Admitting: Family Medicine

## 2018-10-31 ENCOUNTER — Ambulatory Visit: Payer: Self-pay

## 2018-10-31 ENCOUNTER — Other Ambulatory Visit: Payer: Self-pay

## 2018-10-31 ENCOUNTER — Encounter: Payer: Self-pay | Admitting: Family Medicine

## 2018-10-31 VITALS — BP 126/84 | HR 75 | Ht 68.0 in | Wt 234.0 lb

## 2018-10-31 DIAGNOSIS — M7552 Bursitis of left shoulder: Secondary | ICD-10-CM | POA: Diagnosis not present

## 2018-10-31 DIAGNOSIS — G8929 Other chronic pain: Secondary | ICD-10-CM

## 2018-10-31 DIAGNOSIS — M25512 Pain in left shoulder: Secondary | ICD-10-CM

## 2018-10-31 NOTE — Patient Instructions (Signed)
Good to see you.  Ice 20 minutes 2 times daily. Usually after activity and before bed. Exercises 3 times a week.  Vitamin D 2000 IU daily  See me again in 6 weeks  

## 2018-10-31 NOTE — Progress Notes (Signed)
[facility] [place], [place] [address] Phone: [phone] Subjective:   I [name] am serving as a Education administrator for Dr. Hulan Saas.  I'm seeing this patient by the request  of:  Biagio Borg, MD   CC: Left shoulder pain  [id number]  Savannah [name] is a 63 y.o. female coming in with complaint of left shoulder pain. Limited ROM.   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   Hypercholesterolemia    Hypertension    Hypothyroidism    endocrinologist -  dr ellision   Iron deficiency anemia    Lumbar spondylosis    NAFLD (nonalcoholic fatty liver disease)    dx 2010--- last hepatic panel in epic dated 04-17-2017   Nocturia    OA (osteoarthritis of spine)    Lumbar   OSA on CPAP pulmologist-  dr Halford Chessman   sleep study 09-11-2004  very severe osa , AHI 119/hr,  setting 10   Peripheral neuropathy    bottom of feet   Renal calculus, right    Type 2 diabetes mellitus ([place])    endocrinoloigst-  dr Loanne Drilling,  last A1c 5.9 on 02-20-219 in epic   Vitamin B 12 deficiency    Wears glasses    Past Surgical History:  Procedure Laterality Date   BUNIONECTOMY/  HAMMERTOE CORRECTION'S  09-07-2013   SCG   RIGHT FOOT 2ND, 3RD, 4TH, 5TH DIGITS   CARDIAC CATHETERIZATION  06-01-2009  dr Angelena Form   no evidence CAD,  normal LVSF, elevated blood pressure w/ elevated end-diastolic pressure, ef  [id number]   CARDIOVASCULAR STRESS TEST  05-11-2009   dr Angelena Form   abnormal lexiscan nuclear study (no exercise) w/ mild ischemia in the distal anteroseptal wall and apex, this defect may be due to shifting breast attenuation   COLONOSCOPY WITH SNARE POLYPECTOMY WITH ESOPHAGOGASTRODUODENOSCOPY  02/08/2014   with biopsy Dr. Jerilynn Mages. [facility] Plan   CYSTOSCOPY WITH RETROGRADE PYELOGRAM, URETEROSCOPY AND STENT PLACEMENT Right 06/07/2017   Procedure: CYSTOSCOPY WITH RETROGRADE PYELOGRAM, URETEROSCOPY, LITHOTRIPSY,  AND STENT PLACEMENT 1st STAGE;  Surgeon: Alexis Frock, MD;  Location: [facility];  Service: Urology;  Laterality: Right;   CYSTOSCOPY WITH RETROGRADE PYELOGRAM, URETEROSCOPY AND STENT PLACEMENT Right 06/21/2017   Procedure: CYSTOSCOPY WITH RETROGRADE PYELOGRAM, URETEROSCOPY AND STENT PLACEMENT 2ND STAGE  STENT" EXCHANGE";  Surgeon: Alexis Frock, MD;  Location: [facility];  Service: Urology;  Laterality: Right;   FRACTURE SURGERY Right    Compound fracture foot   HOLMIUM LASER APPLICATION Right [id number]   Procedure: HOLMIUM LASER APPLICATION;  Surgeon: Alexis Frock, MD;  Location: [facility];  Service: Urology;  Laterality: Right;   HOLMIUM LASER APPLICATION Right [id number]   Procedure: HOLMIUM LASER APPLICATION;  Surgeon: Alexis Frock, MD;  Location: [facility];  Service: Urology;  Laterality: Right;   KNEE  ARTHROSCOPY Left 1993   ROBOTIC ASSITED PARTIAL NEPHRECTOMY Right 05/19/2014   Procedure: ROBOTIC ASSITED PARTIAL NEPHRECTOMY;  Surgeon: Alexis Frock, MD;  Location: [facility];  Service: Urology;  Laterality: Right;   Social History   Socioeconomic History   Marital status: Married    Spouse name: Not on file   Number of children: 2   Years of education: Not on file   Highest education level: Not on file  Occupational History   Occupation: Scientist, research (physical sciences): UNEMPLOYED  Social Film/video editor strain: Not on file   Food insecurity    Worry: Not on file    Inability: Not on file   Transportation needs    Medical: Not on file    Non-medical: Not on file  Tobacco Use   Smoking status: Former Smoker    Packs/day: 0.30    Years: 2.00    Pack years: 0.60    Types: Cigarettes    Quit date: 02/27/1975    Years since quitting: 43.7   Smokeless tobacco: Never Used  Substance and Sexual Activity   Alcohol use: No   Drug use: No   Sexual activity: Not on file  Lifestyle   Physical activity    Days per week: Not on file    Minutes per session: Not on file   Stress: Not on file  Relationships   Social connections    Talks on phone: Not on file    Gets together: Not on file    Attends religious service: Not on file    Active member of club or organization: Not on file    Attends meetings of clubs or organizations: Not on file    Relationship status: Not on file  Other Topics Concern   Not on file  Social History Narrative   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   metFORMIN (GLUCOPHAGE) 500 MG tablet, Take 2 tablets (1,000 mg total) by mouth 2 (two) times daily with a meal.   pioglitazone (ACTOS) 15 MG tablet, Take 1 tablet (15 mg total) by mouth daily.   predniSONE (DELTASONE) 20 MG tablet, Take 3 tablets once daily for 3 days, then  take 2 tablets once daily for 3 days, then take 1 tablet once daily for 3 days, then STOP.  Current Outpatient Medications (Cardiovascular):    atorvastatin (LIPITOR) 40 MG tablet, Take 1 tablet (40 mg total) by mouth daily.   losartan-hydrochlorothiazide (HYZAAR) 100-12.5 MG tablet, Take 1 tablet by mouth daily.   metoprolol succinate (TOPROL-XL) 25 MG 24 hr tablet, Take 0.5 tablets (12.5 mg total) by mouth daily.  Current Outpatient Medications (Respiratory):    albuterol (PROAIR HFA) 108 (90 Base) MCG/ACT inhaler, Inhale 2 puffs into the lungs every 4 (  four) hours as needed for wheezing or shortness of breath (cough).   dextromethorphan (DELSYM) 30 MG/5ML liquid, Take 5 mLs (30 mg total) by mouth 2 (two) times daily as needed for cough.   Fluticasone-Salmeterol (ADVAIR) 100-50 MCG/DOSE AEPB, Inhale 1 puff into the lungs 2 (two) times daily.  Current Outpatient Medications (Analgesics):    Oxycodone HCl 10 MG TABS, Take 10 mg by mouth daily as needed (pain).   Current Outpatient Medications (Hematological):    Cyanocobalamin (B-12 COMPLIANCE INJECTION IJ), Inject as directed every 30 (thirty) days.   ferrous sulfate 325 (65 FE) MG tablet, Take 325 mg by mouth daily with breakfast.  Current Outpatient Medications (Other):    cholecalciferol (VITAMIN D) 1000 units tablet, Take 1,000 Units by mouth 2 (two) times daily.   citalopram (CELEXA) 20 MG tablet, TAKE 1 TABLET BY MOUTH EVERY DAY--- takes in am   gabapentin (NEURONTIN) 300 MG capsule, TAKE 1 CAPSULE BY MOUTH EVERYDAY AT BEDTIME   glucose blood (ONETOUCH VERIO) test strip, Use to check blood sugar 1 time per day.   hyoscyamine (LEVSIN SL) 0.125 MG SL tablet, Take 1-2 tablets by mouth or under tongue every 4 hours as needed. (Patient taking differently: Take 0.125-0.25 mg by mouth every 4 (four) hours as needed for cramping (stomach spasms). )   ondansetron (ZOFRAN-ODT) 4 MG disintegrating tablet, Take 1 tablet (4 mg  total) by mouth every 8 (eight) hours as needed for nausea or vomiting.   ONETOUCH DELICA LANCETS FINE MISC, Use to check blood sugar 1 time per day.   traZODone (DESYREL) 100 MG tablet, TAKE 1 TABLET BY MOUTH EVERYDAY AT BEDTIME   XIIDRA 5 % SOLN, Place 1 drop into both eyes 2 (two) times daily.    Past medical history, social, surgical and family history all reviewed in electronic medical record.  No pertanent information unless stated regarding to the chief complaint.   Review of Systems:  No headache, visual changes, nausea, vomiting, diarrhea, constipation, dizziness, abdominal pain, skin rash, fevers, chills, night sweats, weight loss, swollen lymph nodes, body aches, joint swelling, muscle aches, chest pain, shortness of breath, mood changes.   Objective  Blood pressure 126/84, pulse 75, height 5\' 8"  (1.727 m), weight 234 lb (106.1 kg), SpO2 97 %.    General: No apparent distress alert and oriented x3 mood and affect normal, dressed appropriately.  HEENT: Pupils equal, extraocular movements intact  Respiratory: Patient's speak in full sentences and does not appear short of breath  Cardiovascular: No lower extremity edema, non tender, no erythema  Skin: Warm dry intact with no signs of infection or rash on extremities or on axial skeleton.  Abdomen: Soft nontender  Neuro: Cranial nerves II through XII are intact, neurovascularly intact in all extremities with 2+ DTRs and 2+ pulses.  Lymph: No lymphadenopathy of posterior or anterior cervical chain or axillae bilaterally.  Gait normal with good balance and coordination.  MSK:  Non tender with full range of motion and good stability and symmetric strength and tone of  elbows, wrist, hip, knee and ankles bilaterally.  Shoulder: left Inspection reveals no abnormalities, atrophy or asymmetry. Palpation is normal with no tenderness over AC joint or bicipital groove. ROM is full in all planes passively. Rotator cuff strength normal  throughout. signs of impingement with positive Neer and Hawkin's tests, but negative empty can sign. Speeds and Yergason's tests normal. No labral pathology noted with negative Obrien's, negative clunk and good stability. Normal scapular function observed. No painful arc  and no drop arm sign. No apprehension sign  MSK US performed of: left This study was ordered, performed, and interpreted by Charlann Boxer D.O.  Shoulder:   Supraspinatus:  Appears normal on long and transverse views, Bursal bulge seen with shoulder abduction on impingement view. Infraspinatus:  Appears normal on long and transverse views. Significant increase in Doppler flow Subscapularis:  Appears normal on long and transverse views. Positive bursa Teres Minor:  Appears normal on long and transverse views. AC joint:  Capsule undistended, no geyser sign. Glenohumeral Joint:  Appears normal without effusion. Glenoid Labrum:  Intact without visualized tears. Biceps Tendon:  Appears normal on long and transverse views, no fraying of tendon, tendon located in intertubercular groove, no subluxation with shoulder internal or external rotation.  Impression: Subacromial bursitis  Procedure: Real-time Ultrasound Guided Injection of left glenohumeral joint Device: GE Logiq E  Ultrasound guided injection is preferred based studies that show increased duration, increased effect, greater accuracy, decreased procedural pain, increased response rate with ultrasound guided versus blind injection.  Verbal informed consent obtained.  Time-out conducted.  Noted no overlying erythema, induration, or other signs of local infection.  Skin prepped in a sterile fashion.  Local anesthesia: Topical Ethyl chloride.  With sterile technique and under real time ultrasound guidance:  Joint visualized.  23g 1  inch needle inserted posterior approach. Pictures taken for needle placement. Patient did have injection of 2 cc of 1% lidocaine, 2 cc of 0.5%  Marcaine, and 1.0 cc of Kenalog 40 mg/dL. Completed without difficulty  Pain immediately resolved suggesting accurate placement of the medication.  Advised to call if fevers/chills, erythema, induration, drainage, or persistent bleeding.  Images permanently stored and available for review in the ultrasound unit.  Impression: Technically successful ultrasound guided injection.  [id number]; 15 additional minutes spent for Therapeutic exercises as stated in above notes.  This included exercises focusing on stretching, strengthening, with significant focus on eccentric aspects.   Long term goals include an improvement in range of motion, strength, endurance as well as avoiding reinjury. Patient's frequency would include in 1-2 times a day, 3-5 times a week for a duration of 6-12 weeks. Shoulder Exercises that included:  Basic scapular stabilization to include adduction and depression of scapula Scaption, focusing on proper movement and good control Internal and External rotation utilizing a theraband, with elbow tucked at side entire time Rows with theraband   Proper technique shown and discussed handout in great detail with ATC.  All questions were discussed and answered.     Impression and Recommendations:     This case required medical decision making of moderate complexity. The above documentation has been reviewed and is accurate and complete Lyndal Pulley, DO       Note: This dictation was prepared with Dragon dictation along with smaller phrase technology. Any transcriptional errors that result from this process are unintentional.

## 2018-10-31 NOTE — Assessment & Plan Note (Signed)
Patient given injection, icing regimen, topical anti-inflammatories discussed over-the-counter medications Home exercises given, follow-up again in 6 weeks.  Patient recently did lose sister that likely is contributing to some of the pain as well.

## 2018-11-17 ENCOUNTER — Other Ambulatory Visit: Payer: Self-pay | Admitting: Internal Medicine

## 2018-11-21 ENCOUNTER — Other Ambulatory Visit: Payer: Self-pay | Admitting: Internal Medicine

## 2018-11-29 ENCOUNTER — Ambulatory Visit: Payer: BC Managed Care – PPO

## 2018-12-10 NOTE — Progress Notes (Signed)
[name] [address], [place] [address] Phone: [phone] Subjective:   Savannah [name], am serving as a scribe for Dr. Hulan Saas.   CC: Left shoulder pain, new onset of hip pain  [id number]   10/31/2018 Patient given injection, icing regimen, topical anti-inflammatories discussed over-the-counter medications Home exercises given, follow-up again in 6 weeks.  Patient recently did lose sister that likely is contributing to some of the pain as well.  Update 12/11/2018 Savannah [name] is a 63 y.o. female coming in with complaint of left shoulder pain. Pain with IR and extension. Injection did improve pain. Pain over front and top of shoulder.   Patient fell last evening in the bathroom. Fell on left hip and did strain left shoulder in the process. Denies any head or neck pain.     Past Medical History:  Diagnosis Date  . Allergic rhinitis   . Asthma   . B12 deficiency   . Depression   . Grade I internal hemorrhoids   . Hiatal hernia   . History of colon polyps   . History of kidney stones   . 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 Hypercholesterolemia   . Hypertension   . Hypothyroidism    endocrinologist -  dr Hale Bogus  . Iron deficiency anemia   . Lumbar spondylosis   . NAFLD (nonalcoholic fatty liver disease)    dx 2010--- last hepatic panel in epic dated 04-17-2017  . Nocturia   . OA (osteoarthritis of spine)    Lumbar  . OSA on CPAP pulmologist-  dr sood   sleep study 09-11-2004  very severe osa , AHI 119/hr,  setting 10  . Peripheral neuropathy    bottom of feet  . Renal calculus, right   . Type 2 diabetes mellitus ([place])    endocrinoloigst-  dr Loanne Drilling,  last A1c 5.9 on 02-20-219 in epic  . Vitamin B 12 deficiency   . Wears glasses    Past Surgical History:   Procedure Laterality Date  . BUNIONECTOMY/  HAMMERTOE CORRECTION'S  09-07-2013   SCG   RIGHT FOOT 2ND, 3RD, 4TH, 5TH DIGITS  . CARDIAC CATHETERIZATION  06-01-2009  dr Angelena Form   [name] evidence CAD,  normal LVSF, elevated blood pressure w/ elevated end-diastolic pressure, ef [id number]  . CARDIOVASCULAR STRESS TEST  05-11-2009   dr Angelena Form   abnormal lexiscan nuclear study ([name] exercise) w/ mild ischemia in the distal anteroseptal wall and apex, this defect may be due to shifting breast attenuation  . COLONOSCOPY WITH SNARE POLYPECTOMY WITH ESOPHAGOGASTRODUODENOSCOPY  02/08/2014   with biopsy Dr. Jerilynn Mages. [facility] Plan  . CYSTOSCOPY WITH RETROGRADE PYELOGRAM, URETEROSCOPY AND STENT PLACEMENT Right 06/07/2017   Procedure: CYSTOSCOPY WITH RETROGRADE PYELOGRAM, URETEROSCOPY, LITHOTRIPSY,  AND STENT PLACEMENT 1st STAGE;  Surgeon: Alexis Frock, MD;  Location: [facility];  Service: Urology;  Laterality: Right;  . CYSTOSCOPY WITH RETROGRADE PYELOGRAM, URETEROSCOPY AND STENT PLACEMENT Right 06/21/2017   Procedure: CYSTOSCOPY WITH RETROGRADE PYELOGRAM, URETEROSCOPY AND STENT PLACEMENT 2ND STAGE  STENT" EXCHANGE";  Surgeon: Alexis Frock, MD;  Location: [facility];  Service: Urology;  Laterality: Right;  . FRACTURE SURGERY Right    Compound fracture foot  . HOLMIUM LASER APPLICATION Right [id number]   Procedure: HOLMIUM LASER APPLICATION;  Surgeon: Alexis Frock, MD;  Location: [facility];  Service: Urology;  Laterality: Right;  . HOLMIUM LASER APPLICATION Right [id number]   Procedure: HOLMIUM LASER APPLICATION;  Surgeon: Alexis Frock, MD;  [facility]  Service: Urology;  Laterality: Right;  . KNEE ARTHROSCOPY Left 1993  . ROBOTIC ASSITED PARTIAL NEPHRECTOMY Right 05/19/2014   Procedure: ROBOTIC ASSITED PARTIAL NEPHRECTOMY;  Surgeon: Alexis Frock, MD;  Location: [facility];  Service: Urology;  Laterality: Right;   Social History   Socioeconomic  History  . Marital status: Married    Spouse name: Not on file  . Number of children: 2  . Years of education: Not on file  . Highest education level: Not on file  Occupational History  . Occupation: Scientist, research (physical sciences): UNEMPLOYED  Social Needs  . Financial resource strain: Not on file  . Food insecurity    Worry: Not on file    Inability: Not on file  . Transportation needs    Medical: Not on file    Non-medical: Not on file  Tobacco Use  . Smoking status: Former Smoker    Packs/day: 0.30    Years: 2.00    Pack years: 0.60    Types: Cigarettes    Quit date: 02/27/1975    Years since quitting: 43.8  . Smokeless tobacco: Never Used  Substance and Sexual Activity  . Alcohol use: [name]  . Drug use: [name]  . Sexual activity: Not on file  Lifestyle  . Physical activity    Days per week: Not on file    Minutes per session: Not on file  . Stress: Not on file  Relationships  . Social Herbalist on phone: Not on file    Gets together: Not on file    Attends religious service: Not on file    Active member of club or organization: Not on file    Attends meetings of clubs or organizations: Not on file    Relationship status: Not on file  Other Topics Concern  . 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 Erythromycin Nausea And Vomiting   Family History  Problem Relation Age of Onset  . Colon cancer Mother 90  . Diabetes Mother   . Kidney disease Mother   . Heart attack Father   . Heart disease Father   . Allergies Sister   . Asthma Sister   . Clotting disorder Grandchild   . Heart disease Paternal Grandmother   . Esophageal cancer Neg Hx   . Rectal cancer Neg Hx   . Stomach cancer Neg Hx     Current Outpatient Medications (Endocrine & Metabolic):  .  levothyroxine (SYNTHROID, LEVOTHROID) 137 MCG tablet, Take 1 tablet (137 mcg total) by mouth daily before breakfast. .  metFORMIN  (GLUCOPHAGE) 500 MG tablet, Take 2 tablets (1,000 mg total) by mouth 2 (two) times daily with a meal. .  pioglitazone (ACTOS) 15 MG tablet, Take 1 tablet (15 mg total) by mouth daily. .  predniSONE (DELTASONE) 20 MG tablet, Take 3 tablets once daily for 3 days, then take 2 tablets once daily for 3 days, then take 1 tablet once daily for 3 days, then STOP.  Current Outpatient Medications (Cardiovascular):  .  atorvastatin (LIPITOR) 40 MG tablet, Take 1 tablet (40 mg total) by mouth daily. Marland Kitchen  losartan-hydrochlorothiazide (HYZAAR) 100-12.5 MG tablet, Take 1 tablet by mouth daily. Marland Kitchen  metoprolol succinate (TOPROL-XL) 25 MG 24 hr tablet, Take 0.5 tablets (12.5 mg total) by mouth daily.  Current Outpatient Medications (Respiratory):  Marland Kitchen  ADVAIR DISKUS 100-50 MCG/DOSE AEPB, TAKE 1 PUFF BY MOUTH TWICE A DAY .  albuterol (PROAIR HFA) 108 (90 Base) MCG/ACT inhaler, Inhale 2 puffs into the lungs every 4 (four) hours as needed for wheezing or shortness of breath (cough). Marland Kitchen  dextromethorphan (DELSYM) 30 MG/5ML liquid, Take 5 mLs (30 mg total) by mouth 2 (two) times daily as needed for cough.  Current Outpatient Medications (Analgesics):  Marland Kitchen  Oxycodone HCl 10 MG TABS, Take 10 mg by mouth daily as needed (pain).   Current Outpatient Medications (Hematological):  Marland Kitchen  Cyanocobalamin (B-12 COMPLIANCE INJECTION IJ), Inject as directed every 30 (thirty) days. .  ferrous sulfate 325 (65 FE) MG tablet, Take 325 mg by mouth daily with breakfast.  Current Outpatient Medications (Other):  .  cholecalciferol (VITAMIN D) 1000 units tablet, Take 1,000 Units by mouth 2 (two) times daily. .  citalopram (CELEXA) 20 MG tablet, TAKE 1 TABLET BY MOUTH EVERY DAY--- takes in am .  gabapentin (NEURONTIN) 300 MG capsule, TAKE 1 CAPSULE BY MOUTH EVERYDAY AT BEDTIME .  glucose blood (ONETOUCH VERIO) test strip, Use to check blood sugar 1 time per day. .  hyoscyamine (LEVSIN SL) 0.125 MG SL tablet, Take 1-2 tablets by mouth or under  tongue every 4 hours as needed. (Patient taking differently: Take 0.125-0.25 mg by mouth every 4 (four) hours as needed for cramping (stomach spasms). ) .  ondansetron (ZOFRAN-ODT) 4 MG disintegrating tablet, Take 1 tablet (4 mg total) by mouth every 8 (eight) hours as needed for nausea or vomiting. Glory Rosebush DELICA LANCETS FINE MISC, Use to check blood sugar 1 time per day. .  traZODone (DESYREL) 100 MG tablet, TAKE 1 TABLET BY MOUTH EVERYDAY AT BEDTIME .  XIIDRA 5 % SOLN, Place 1 drop into both eyes 2 (two) times daily.    Past medical history, social, surgical and family history all reviewed in electronic medical record.  [name] pertanent information unless stated regarding to the chief complaint.   Review of Systems:  [name] headache, visual changes, nausea, vomiting, diarrhea, constipation, dizziness, abdominal pain, skin rash, fevers, chills, night sweats, weight loss, swollen lymph nodes, body aches, joint swelling,  chest pain, shortness of breath, mood changes.  Positive muscle aches  Objective  Blood pressure 120/68, pulse 93, height 5\' 8"  (1.727 m), weight 242 lb (109.8 kg), SpO2 97 %.    General: [name] apparent distress alert and oriented x3 mood and affect normal, dressed appropriately.  HEENT: Pupils equal, extraocular movements intact  Respiratory: Patient's speak in full sentences and does not appear short of breath  Cardiovascular: [name] lower extremity edema, non tender, [name] erythema  Skin: Warm dry intact with [name] signs of infection or rash on extremities or on axial skeleton.  Abdomen: Soft nontender  Neuro: Cranial nerves II through XII are intact, neurovascularly intact in all extremities with 2+ DTRs and 2+ pulses.  Lymph: [name] lymphadenopathy of posterior or anterior cervical chain or axillae bilaterally.  Gait antalgic MSK:  tender with limited range of motion and good stability and symmetric strength and tone of elbows, wrist,  knee and ankles bilaterally.  Left shoulder exam  shows the patient still has positive impingement noted.  Improvement in range of motion [name] active fall.  Rotator cuff strength 4+ out of 5.  Positive crossover  Left hip shows contusion over the  greater trochanteric area.  Full range of motion though with internal and external range of motion.  Severely tender over the greater trochanteric area   Procedure: Real-time Ultrasound Guided Injection of acromioclavicular joint Device: GE Logiq Q7 Ultrasound guided injection is preferred based studies that show increased duration, increased effect, greater accuracy, decreased procedural pain, increased response rate, and decreased cost with ultrasound guided versus blind injection.  Verbal informed consent obtained.  Time-out conducted.  Noted [name] overlying erythema, induration, or other signs of local infection.  Skin prepped in a sterile fashion.  Local anesthesia: Topical Ethyl chloride.  With sterile technique and under real time ultrasound guidance: With a 25-gauge half inch needle injected with 0.5 cc of 0.5% Marcaine and 0.5 cc of Kenalog 40 mg/mL Completed without difficulty  Pain immediately resolved suggesting accurate placement of the medication.  Advised to call if fevers/chills, erythema, induration, drainage, or persistent bleeding.  Images permanently stored and available for review in the ultrasound unit.  Impression: Technically successful ultrasound guided injection.   Limited musculoskeletal ultrasound was performed and interpreted by Lyndal Pulley  Limited ultrasound of patient's left hip shows some contusion noted.  Patient does not have any true accumulation noted of the area.  [name] cortical defect appreciated Impression: Contusion   Impression and Recommendations:     This case required medical decision making of moderate complexity. The above documentation has been reviewed and is accurate and complete Lyndal Pulley, DO       Note: This dictation was prepared with  Dragon dictation along with smaller phrase technology. Any transcriptional errors that result from this process are unintentional.

## 2018-12-11 ENCOUNTER — Encounter: Payer: Self-pay | Admitting: Family Medicine

## 2018-12-11 ENCOUNTER — Ambulatory Visit (INDEPENDENT_AMBULATORY_CARE_PROVIDER_SITE_OTHER): Payer: BC Managed Care – PPO | Admitting: Family Medicine

## 2018-12-11 ENCOUNTER — Other Ambulatory Visit: Payer: Self-pay

## 2018-12-11 VITALS — BP 120/68 | HR 93 | Ht 68.0 in | Wt 242.0 lb

## 2018-12-11 DIAGNOSIS — G8929 Other chronic pain: Secondary | ICD-10-CM

## 2018-12-11 DIAGNOSIS — M25512 Pain in left shoulder: Secondary | ICD-10-CM

## 2018-12-11 DIAGNOSIS — M7552 Bursitis of left shoulder: Secondary | ICD-10-CM | POA: Diagnosis not present

## 2018-12-11 DIAGNOSIS — S7002XA Contusion of left hip, initial encounter: Secondary | ICD-10-CM | POA: Diagnosis not present

## 2018-12-11 NOTE — Assessment & Plan Note (Signed)
Patient has more of a left hip contusion.  Discussed the possibility of aspiration but did not see any true fluid accumulation that would be able to be drainable.  Encouraged x-rays with patient declined at the moment with patient being able to bear weight I think that a intratrochanteric injection is likely not necessary.

## 2018-12-11 NOTE — Assessment & Plan Note (Signed)
Improvement noted.  Still some mild discomfort noted.  Patient does have some mild weakness.  We discussed formal physical therapy which patient declined.  Discussed too early for another injection.  Discussed range of motion exercises and encouraged her to do it on a regular basis.  Follow-up again in 6 weeks consider repeat injection at that time.

## 2018-12-11 NOTE — Patient Instructions (Signed)
Arnica lotion for hip  Ice 20 min 2x daily See me again in 6 weeks

## 2018-12-11 NOTE — Assessment & Plan Note (Signed)
Patient given injection and tolerated procedure well.  Discussed icing regimen and home exercise.  Discussed which activities of doing which wants to avoid.

## 2018-12-13 ENCOUNTER — Other Ambulatory Visit: Payer: Self-pay | Admitting: Internal Medicine

## 2018-12-27 ENCOUNTER — Other Ambulatory Visit: Payer: Self-pay | Admitting: Internal Medicine

## 2018-12-27 DIAGNOSIS — E119 Type 2 diabetes mellitus without complications: Secondary | ICD-10-CM

## 2019-01-13 ENCOUNTER — Encounter: Payer: Self-pay | Admitting: Gastroenterology

## 2019-01-20 ENCOUNTER — Ambulatory Visit: Payer: Self-pay

## 2019-01-20 ENCOUNTER — Ambulatory Visit (INDEPENDENT_AMBULATORY_CARE_PROVIDER_SITE_OTHER): Payer: BC Managed Care – PPO | Admitting: Family Medicine

## 2019-01-20 ENCOUNTER — Other Ambulatory Visit: Payer: Self-pay

## 2019-01-20 ENCOUNTER — Encounter: Payer: Self-pay | Admitting: Family Medicine

## 2019-01-20 ENCOUNTER — Ambulatory Visit (INDEPENDENT_AMBULATORY_CARE_PROVIDER_SITE_OTHER)
Admission: RE | Admit: 2019-01-20 | Discharge: 2019-01-20 | Disposition: A | Discharge status: Home / Self Care | Payer: BC Managed Care – PPO | Source: Ambulatory Visit | Attending: Family Medicine | Admitting: Family Medicine

## 2019-01-20 VITALS — BP 108/58 | HR 72 | Ht 68.0 in | Wt 242.0 lb

## 2019-01-20 DIAGNOSIS — M25512 Pain in left shoulder: Secondary | ICD-10-CM | POA: Diagnosis not present

## 2019-01-20 DIAGNOSIS — G8929 Other chronic pain: Secondary | ICD-10-CM

## 2019-01-20 DIAGNOSIS — M7552 Bursitis of left shoulder: Secondary | ICD-10-CM | POA: Diagnosis not present

## 2019-01-20 MED ORDER — PREDNISONE 50 MG PO TABS: ORAL_TABLET | ORAL | 0 refills | Status: DC

## 2019-01-20 NOTE — Patient Instructions (Signed)
Prednisone for 5 days Keep doing exercises Keep hands in peripheral vision See me in 2 months if not completely resolved

## 2019-01-20 NOTE — Assessment & Plan Note (Signed)
Still some mild inflammation surrounding.  Patient does not have any weakness in the rotator cuff.  Patient is 70% better overall.  Discussed which activities to do which wants to avoid.  Topical anti-inflammatories.  Follow-up again in 4 to 8 weeks

## 2019-01-20 NOTE — Progress Notes (Signed)
[name] [place] [place], [place] [address] Phone: [phone] Subjective:   Savannah [name], am serving as a scribe for Dr. Hulan [name].  This visit occurred during the SARS-CoV-2 public health emergency.  Safety protocols were in place, including screening questions prior to the visit, additional usage of staff PPE, and extensive cleaning of exam room while observing appropriate contact time as indicated for disinfecting solutions.    CC: Left shoulder pain follow-up  [id number]   12/11/2018 Patient given injection and tolerated procedure well.  Discussed icing regimen and home exercise.  Discussed which activities of doing which wants to avoid.  Patient has more of a left hip contusion.  Discussed the possibility of aspiration but did not see any true fluid accumulation that would be able to be drainable.  Encouraged x-rays with patient declined at the moment with patient being able to bear weight I think that a intratrochanteric injection is likely not necessary.  Update 01/20/2019 Savannah [name] is a 63 y.o. female coming in with complaint of left shoulder pain. Patient states that pain persists. Injection provided minimal relief. Pain with IR and flexion.       Past Medical History:  Diagnosis Date  . Allergic rhinitis   . Asthma   . B12 deficiency   . Depression   . Grade I internal hemorrhoids   . Hiatal hernia   . History of colon polyps   . History of kidney stones   . 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 Hypercholesterolemia   . Hypertension   . Hypothyroidism    endocrinologist -  dr Hale Bogus  . Iron deficiency anemia   . Lumbar spondylosis   . NAFLD (nonalcoholic fatty liver disease)    dx 2010--- last hepatic panel in epic dated 04-17-2017  . Nocturia   . OA  (osteoarthritis of spine)    Lumbar  . OSA on CPAP pulmologist-  dr sood   sleep study 09-11-2004  very severe osa , AHI 119/hr,  setting 10  . Peripheral neuropathy    bottom of feet  . Renal calculus, right   . Type 2 diabetes mellitus ([place])    endocrinoloigst-  dr Loanne Drilling,  last A1c 5.9 on 02-20-219 in epic  . Vitamin B 12 deficiency   . Wears glasses    Past Surgical History:  Procedure Laterality Date  . BUNIONECTOMY/  HAMMERTOE CORRECTION'S  09-07-2013   SC[facility]   RIGHT FOOT 2ND, 3RD, 4TH, 5TH DIGITS  . CARDIAC CATHETERIZATION  06-01-2009  dr Angelena Form   [name] evidence CAD,  normal LVSF, elevated blood pressure w/ elevated end-diastolic pressure, ef [id number]  . CARDIOVASCULAR STRESS TEST  05-11-2009   dr Angelena Form   abnormal lexiscan nuclear study ([name] exercise) w/ mild ischemia in the distal anteroseptal wall and apex, this defect may be due to shifting breast attenuation  . COLONOSCOPY WITH SNARE POLYPECTOMY WITH ESOPHAGOGASTRODUODENOSCOPY  02/08/2014   with biopsy Dr. Jerilynn Mages. [facility] Plan  . CYSTOSCOPY WITH RETROGRADE PYELOGRAM, URETEROSCOPY AND STENT PLACEMENT Right 06/07/2017   Procedure: CYSTOSCOPY WITH RETROGRADE PYELOGRAM, URETEROSCOPY, LITHOTRIPSY,  AND STENT PLACEMENT 1st STAGE;  Surgeon: Alexis Frock, MD;  Location[facility];  Service: Urology;  Laterality: Right;  . CYSTOSCOPY WITH RETROGRADE PYELOGRAM, URETEROSCOPY AND STENT PLACEMENT Right 06/21/2017   Procedure: CYSTOSCOPY WITH RETROGRADE  PYELOGRAM, URETEROSCOPY AND STENT PLACEMENT 2ND STAGE  STENT" EXCHANGE";  Surgeon: Alexis Frock, MD;  Location: [facility];  Service: Urology;  Laterality: Right;  . FRACTURE SURGERY Right    Compound fracture foot  . HOLMIUM LASER APPLICATION Right [id number]   Procedure: HOLMIUM LASER APPLICATION;  Surgeon: Alexis Frock, MD;  Location: [facility];  Service: Urology;  Laterality: Right;  . HOLMIUM LASER APPLICATION Right [id number]   Procedure:  HOLMIUM LASER APPLICATION;  Surgeon: Alexis Frock, MD;  Location: [facility];  Service: Urology;  Laterality: Right;  . KNEE ARTHROSCOPY Left 1993  . ROBOTIC ASSITED PARTIAL NEPHRECTOMY Right 05/19/2014   Procedure: ROBOTIC ASSITED PARTIAL NEPHRECTOMY;  Surgeon: Alexis Frock, MD;  Location: [facility];  Service: Urology;  Laterality: Right;   Social History   Socioeconomic History  . Marital status: Married    Spouse name: Not on file  . Number of children: 2  . Years of education: Not on file  . Highest education level: Not on file  Occupational History  . Occupation: Scientist, research (physical sciences): UNEMPLOYED  Social Needs  . Financial resource strain: Not on file  . Food insecurity    Worry: Not on file    Inability: Not on file  . Transportation needs    Medical: Not on file    Non-medical: Not on file  Tobacco Use  . Smoking status: Former Smoker    Packs/day: 0.30    Years: 2.00    Pack years: 0.60    Types: Cigarettes    Quit date: 02/27/1975    Years since quitting: 43.9  . Smokeless tobacco: Never Used  Substance and Sexual Activity  . Alcohol use: [name]  . Drug use: [name]  . Sexual activity: Not on file  Lifestyle  . Physical activity    Days per week: Not on file    Minutes per session: Not on file  . Stress: Not on file  Relationships  . Social Herbalist on phone: Not on file    Gets together: Not on file    Attends religious service: Not on file    Active member of club or organization: Not on file    Attends meetings of clubs or organizations: Not on file    Relationship status: Not on file  Other Topics Concern  . 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 Erythromycin Nausea And Vomiting   Family History  Problem Relation Age of Onset  . Colon cancer Mother 15  . Diabetes Mother   . Kidney disease Mother   . Heart attack Father   . Heart disease  Father   . Allergies Sister   . Asthma Sister   . Clotting disorder Grandchild   . Heart disease Paternal Grandmother   . Esophageal cancer Neg Hx   . Rectal cancer Neg Hx   . Stomach cancer Neg Hx     Current Outpatient Medications (Endocrine & Metabolic):  .  levothyroxine (SYNTHROID, LEVOTHROID) 137 MCG tablet, Take 1 tablet (137 mcg total) by mouth daily before breakfast. .  metFORMIN (GLUCOPHAGE) 500 MG tablet, Take 2 tablets (1,000 mg total) by mouth 2 (two) times daily with a meal. .  pioglitazone (ACTOS) 15 MG tablet, Take 1 tablet (15 mg total) by mouth daily. .  predniSONE (DELTASONE) 20 MG tablet, Take 3 tablets once daily  for 3 days, then take 2 tablets once daily for 3 days, then take 1 tablet once daily for 3 days, then STOP. Marland Kitchen  predniSONE (DELTASONE) 50 MG tablet, Take one tablet daily for the next 5 days.  Current Outpatient Medications (Cardiovascular):  .  atorvastatin (LIPITOR) 40 MG tablet, TAKE 1 TABLET BY MOUTH EVERY DAY .  losartan-hydrochlorothiazide (HYZAAR) 100-12.5 MG tablet, Take 1 tablet by mouth daily. .  metoprolol succinate (TOPROL-XL) 25 MG 24 hr tablet, TAKE 1/2 TABLET BY MOUTH EVERY DAY  Current Outpatient Medications (Respiratory):  Marland Kitchen  ADVAIR DISKUS 100-50 MCG/DOSE AEPB, TAKE 1 PUFF BY MOUTH TWICE A DAY .  albuterol (PROAIR HFA) 108 (90 Base) MCG/ACT inhaler, Inhale 2 puffs into the lungs every 4 (four) hours as needed for wheezing or shortness of breath (cough). Marland Kitchen  dextromethorphan (DELSYM) 30 MG/5ML liquid, Take 5 mLs (30 mg total) by mouth 2 (two) times daily as needed for cough.  Current Outpatient Medications (Analgesics):  Marland Kitchen  Oxycodone HCl 10 MG TABS, Take 10 mg by mouth daily as needed (pain).   Current Outpatient Medications (Hematological):  Marland Kitchen  Cyanocobalamin (B-12 COMPLIANCE INJECTION IJ), Inject as directed every 30 (thirty) days. .  ferrous sulfate 325 (65 FE) MG tablet, Take 325 mg by mouth daily with breakfast.  Current Outpatient  Medications (Other):  .  cholecalciferol (VITAMIN D) 1000 units tablet, Take 1,000 Units by mouth 2 (two) times daily. .  citalopram (CELEXA) 20 MG tablet, TAKE 1 TABLET BY MOUTH EVERY DAY--- takes in am .  gabapentin (NEURONTIN) 300 MG capsule, TAKE 1 CAPSULE BY MOUTH EVERYDAY AT BEDTIME .  glucose blood (ONETOUCH VERIO) test strip, Use to check blood sugar 1 time per day. .  hyoscyamine (LEVSIN SL) 0.125 MG SL tablet, Take 1-2 tablets by mouth or under tongue every 4 hours as needed. (Patient taking differently: Take 0.125-0.25 mg by mouth every 4 (four) hours as needed for cramping (stomach spasms). ) .  ondansetron (ZOFRAN-ODT) 4 MG disintegrating tablet, Take 1 tablet (4 mg total) by mouth every 8 (eight) hours as needed for nausea or vomiting. Glory Rosebush DELICA LANCETS FINE MISC, Use to check blood sugar 1 time per day. .  traZODone (DESYREL) 100 MG tablet, TAKE 1 TABLET BY MOUTH EVERYDAY AT BEDTIME .  XIIDRA 5 % SOLN, Place 1 drop into both eyes 2 (two) times daily.    Past medical history, social, surgical and family history all reviewed in electronic medical record.  [name] pertanent information unless stated regarding to the chief complaint.   Review of Systems:  [name] headache, visual changes, nausea, vomiting, diarrhea, constipation, dizziness, abdominal pain, skin rash, fevers, chills, night sweats, weight loss, swollen lymph nodes, body aches, joint swelling, chest pain, shortness of breath, mood changes.  Positive muscle aches  Objective  Blood pressure (!) 108/58, pulse 72, height 5\' 8"  (1.727 m), weight 242 lb (109.8 kg), SpO2 97 %.   General: [name] apparent distress alert and oriented x3 mood and affect normal, dressed appropriately.  HEENT: Pupils equal, extraocular movements intact  Respiratory: Patient's speak in full sentences and does not appear short of breath  Cardiovascular: [name] lower extremity edema, non tender, [name] erythema  Skin: Warm dry intact with [name] signs of infection  or rash on extremities or on axial skeleton.  Abdomen: Soft nontender  Neuro: Cranial nerves II through XII are intact, neurovascularly intact in all extremities with 2+ DTRs and 2+ pulses.  Lymph: [name] lymphadenopathy of posterior or anterior  cervical chain or axillae bilaterally.  Gait normal with good balance and coordination.  MSK:  tender with full range of motion and good stability and symmetric strength and tone of , elbows, wrist, hip, knee and ankles bilaterally.  Pain overall is out of proportion to the amount of palpation  Left shoulder exam does have positive impingement noted. Positive crossover testing noted.  Positive for strength of the rotator cuff noted.   Impression and Recommendations:    . The above documentation has been reviewed and is accurate and complete Lyndal Pulley, DO       Note: This dictation was prepared with Dragon dictation along with smaller phrase technology. Any transcriptional errors that result from this process are unintentional.

## 2019-01-20 NOTE — Assessment & Plan Note (Addendum)
Discussed arthritic changes.  Discussed topical anti-inflammatories.  Discussed which activities to do which wants to avoid.  Patient wants to continue conservative therapy.

## 2019-01-26 ENCOUNTER — Encounter: Payer: Self-pay | Admitting: Gastroenterology

## 2019-01-29 ENCOUNTER — Ambulatory Visit (AMBULATORY_SURGERY_CENTER): Payer: BC Managed Care – PPO | Admitting: *Deleted

## 2019-01-29 ENCOUNTER — Other Ambulatory Visit: Payer: Self-pay

## 2019-01-29 ENCOUNTER — Encounter: Payer: Self-pay | Admitting: Gastroenterology

## 2019-01-29 VITALS — Temp 97.2°F | Ht 68.0 in | Wt 243.0 lb

## 2019-01-29 DIAGNOSIS — Z1159 Encounter for screening for other viral diseases: Secondary | ICD-10-CM

## 2019-01-29 DIAGNOSIS — Z86010 Personal history of colonic polyps: Secondary | ICD-10-CM

## 2019-01-29 DIAGNOSIS — Z800 Family history of malignant neoplasm of digestive organs: Secondary | ICD-10-CM

## 2019-01-29 MED ORDER — NA SULFATE-K SULFATE-MG SULF 17.5-3.13-1.6 GM/177ML PO SOLN: ORAL | 0 refills | Status: DC

## 2019-01-29 NOTE — Progress Notes (Signed)
Patient is here in-person for PV. Patient denies any allergies to eggs or soy. Patient denies any problems with anesthesia/sedation. Patient denies any oxygen use at home. Patient denies taking any diet/weight loss medications or blood thinners. Patient is not being treated for MRSA or C-diff. EMMI education assisgned to the patient for the procedure, this was explained and instructions given to patient. COVID-19 screening test is on 12/14 1220pm, the pt is aware. Pt is aware that care partner will wait in the car during procedure; if they feel like they will be too hot or cold to wait in the car; they may wait in the 4 th floor lobby. Patient is aware to bring only one care partner. We want them to wear a mask (we do not have any that we can provide them), practice social distancing, and we will check their temperatures when they get here.  I did remind the patient that their care partner needs to stay in the parking lot the entire time and have a cell phone available, we will call them when the pt is ready for discharge. Patient will wear mask into building.    Suprep $15 off coupon given to the patient.

## 2019-02-04 ENCOUNTER — Other Ambulatory Visit: Payer: Self-pay

## 2019-02-04 ENCOUNTER — Ambulatory Visit (INDEPENDENT_AMBULATORY_CARE_PROVIDER_SITE_OTHER): Payer: BC Managed Care – PPO | Admitting: *Deleted

## 2019-02-04 DIAGNOSIS — Z23 Encounter for immunization: Secondary | ICD-10-CM | POA: Diagnosis not present

## 2019-02-09 ENCOUNTER — Other Ambulatory Visit: Payer: Self-pay | Admitting: Gastroenterology

## 2019-02-09 ENCOUNTER — Ambulatory Visit (INDEPENDENT_AMBULATORY_CARE_PROVIDER_SITE_OTHER): Payer: BC Managed Care – PPO

## 2019-02-09 DIAGNOSIS — Z1159 Encounter for screening for other viral diseases: Secondary | ICD-10-CM

## 2019-02-10 ENCOUNTER — Other Ambulatory Visit: Payer: Self-pay | Admitting: Internal Medicine

## 2019-02-10 LAB — SARS CORONAVIRUS 2 (TAT 6-24 HRS): SARS Coronavirus 2: NEGATIVE

## 2019-02-12 ENCOUNTER — Encounter: Payer: Self-pay | Admitting: Gastroenterology

## 2019-02-12 ENCOUNTER — Other Ambulatory Visit: Payer: Self-pay

## 2019-02-12 ENCOUNTER — Ambulatory Visit (AMBULATORY_SURGERY_CENTER): Payer: BC Managed Care – PPO | Admitting: Gastroenterology

## 2019-02-12 VITALS — BP 109/47 | HR 76 | Temp 98.7°F | Resp 17 | Ht 68.0 in | Wt 243.0 lb

## 2019-02-12 DIAGNOSIS — Z800 Family history of malignant neoplasm of digestive organs: Secondary | ICD-10-CM

## 2019-02-12 DIAGNOSIS — D123 Benign neoplasm of transverse colon: Secondary | ICD-10-CM

## 2019-02-12 DIAGNOSIS — Z86010 Personal history of colonic polyps: Secondary | ICD-10-CM

## 2019-02-12 MED ORDER — SODIUM CHLORIDE 0.9 % IV SOLN: 500.0000 mL | Freq: Once | INTRAVENOUS | Status: DC

## 2019-02-12 NOTE — Progress Notes (Signed)
Pt tolerated well. VSS. Awake and to recovery. 

## 2019-02-12 NOTE — Patient Instructions (Signed)
Handouts given for polyps and hemorrhoids.  YOU HAD AN ENDOSCOPIC PROCEDURE TODAY AT THE [facility]:   Refer to the procedure report that was given to you for any specific questions about what was found during the examination.  If the procedure report does not answer your questions, please call your gastroenterologist to clarify.  If you requested that your care partner not be given the details of your procedure findings, then the procedure report has been included in a sealed envelope for you to review at your convenience later.  YOU SHOULD EXPECT: Some feelings of bloating in the abdomen. Passage of more gas than usual.  Walking can help get rid of the air that was put into your GI tract during the procedure and reduce the bloating. If you had a lower endoscopy (such as a colonoscopy or flexible sigmoidoscopy) you may notice spotting of blood in your stool or on the toilet paper. If you underwent a bowel prep for your procedure, you may not have a normal bowel movement for a few days.  Please Note:  You might notice some irritation and congestion in your nose or some drainage.  This is from the oxygen used during your procedure.  There is no need for concern and it should clear up in a day or so.  SYMPTOMS TO REPORT IMMEDIATELY:   Following lower endoscopy (colonoscopy or flexible sigmoidoscopy):  Excessive amounts of blood in the stool  Significant tenderness or worsening of abdominal pains  Swelling of the abdomen that is new, acute  Fever of 100F or higher  For urgent or emergent issues, a gastroenterologist can be reached at any hour by calling [phone].   DIET:  We do recommend a small meal at first, but then you may proceed to your regular diet.  Drink plenty of fluids but you should avoid alcoholic beverages for 24 hours.  ACTIVITY:  You should plan to take it easy for the rest of today and you should NOT DRIVE or use heavy machinery until tomorrow (because of the  sedation medicines used during the test).    FOLLOW UP: Our staff will call the number listed on your records 48-72 hours following your procedure to check on you and address any questions or concerns that you may have regarding the information given to you following your procedure. If we do not reach you, we will leave a message.  We will attempt to reach you two times.  During this call, we will ask if you have developed any symptoms of COVID 19. If you develop any symptoms (ie: fever, flu-like symptoms, shortness of breath, cough etc.) before then, please call [phone].  If you test positive for Covid 19 in the 2 weeks post procedure, please call and report this information to us.    If any biopsies were taken you will be contacted by phone or by letter within the next 1-3 weeks.  Please call us at [phone] if you have not heard about the biopsies in 3 weeks.    SIGNATURES/CONFIDENTIALITY: You and/or your care partner have signed paperwork which will be entered into your electronic medical record.  These signatures attest to the fact that that the information above on your After Visit Summary has been reviewed and is understood.  Full responsibility of the confidentiality of this discharge information lies with you and/or your care-partner. 

## 2019-02-12 NOTE — Progress Notes (Signed)
Called to room to assist during endoscopic procedure.  Patient ID and intended procedure confirmed with present staff. Received instructions for my participation in the procedure from the performing physician.  

## 2019-02-12 NOTE — Op Note (Signed)
[facility] Patient Name: Savannah [name] Procedure Date: 02/12/2019 11:11 AM MRN: [id number] Endoscopist: Ladene Artist , MD Age: 62 Referring MD:  Date of Birth: February 16, 1956 Gender: Female Account #: [id number] Procedure:                Colonoscopy Indications:              High risk colon cancer surveillance: Personal                            history of sessile serrated colon polyp (less than                            10 mm in size) with no dysplasia. Family history of                            colon cancer, first degree relative. Medicines:                Monitored Anesthesia Care Procedure:                Pre-Anesthesia Assessment:                           - Prior to the procedure, a History and Physical                            was performed, and patient medications and                            allergies were reviewed. The patient's tolerance of                            previous anesthesia was also reviewed. The risks                            and benefits of the procedure and the sedation                            options and risks were discussed with the patient.                            All questions were answered, and informed consent                            was obtained. Prior Anticoagulants: The patient has                            taken no previous anticoagulant or antiplatelet                            agents. ASA Grade Assessment: III - A patient with                            severe systemic disease. After reviewing the risks  and benefits, the patient was deemed in                            satisfactory condition to undergo the procedure.                           After obtaining informed consent, the colonoscope                            was passed under direct vision. Throughout the                            procedure, the patient's blood pressure, pulse, and                            oxygen saturations  were monitored continuously. The                            Colonoscope was introduced through the anus and                            advanced to the the cecum, identified by                            appendiceal orifice and ileocecal valve. The                            ileocecal valve, appendiceal orifice, and rectum                            were photographed. The quality of the bowel                            preparation was good. The colonoscopy was performed                            without difficulty. The patient tolerated the                            procedure well. Scope In: 11:23:56 AM Scope Out: 11:48:35 AM Scope Withdrawal Time: 0 hours 20 minutes 22 seconds  Total Procedure Duration: 0 hours 24 minutes 39 seconds  Findings:                 The perianal and digital rectal examinations were                            normal.                           Seven sessile polyps were found in the transverse                            colon. The polyps were 6 to 8 mm in size. These  polyps were removed with a cold snare. Resection                            was complete and retrieval was complete for 6 of                            the 7 polyps.                           Internal hemorrhoids were found during                            retroflexion. The hemorrhoids were small and Grade                            I (internal hemorrhoids that do not prolapse).                           The exam was otherwise without abnormality on                            direct and retroflexion views. Complications:            No immediate complications. Estimated blood loss:                            None. Estimated Blood Loss:     Estimated blood loss: none. Impression:               - Seven 6 to 8 mm polyps in the transverse colon,                            removed with a cold snare. Resected and retrieved.                           - Internal hemorrhoids.                            - The examination was otherwise normal on direct                            and retroflexion views. Recommendation:           - Repeat colonoscopy, likely in 3 years, after                            studies are complete for surveillance based on                            pathology results.                           - Patient has a contact number available for                            emergencies. The signs and symptoms of potential  delayed complications were discussed with the                            patient. Return to normal activities tomorrow.                            Written discharge instructions were provided to the                            patient.                           - Resume previous diet.                           - Continue present medications.                           - Await pathology results. Ladene Artist, MD 02/12/2019 11:52:35 AM This report has been signed electronically.

## 2019-02-14 ENCOUNTER — Other Ambulatory Visit: Payer: Self-pay | Admitting: Internal Medicine

## 2019-02-14 DIAGNOSIS — E119 Type 2 diabetes mellitus without complications: Secondary | ICD-10-CM

## 2019-02-16 ENCOUNTER — Telehealth: Payer: Self-pay

## 2019-02-16 NOTE — Telephone Encounter (Signed)
  Follow up Call-  Call back number 02/12/2019  Post procedure Call Back phone  # [phone]  Permission to leave phone message Yes  Some recent data might be hidden     Patient questions:  Do you have a fever, pain , or abdominal swelling? No. Pain Score  0 *  Have you tolerated food without any problems? Yes.    Have you been able to return to your normal activities? Yes.    Do you have any questions about your discharge instructions: Diet   No. Medications  No. Follow up visit  No.  Do you have questions or concerns about your Care? No.  Actions: * If pain score is 4 or above: No action needed, pain <4.  1. Have you developed a fever since your procedure? no  2.   Have you had an respiratory symptoms (SOB or cough) since your procedure? no  3.   Have you tested positive for COVID 19 since your procedure no  4.   Have you had any family members/close contacts diagnosed with the COVID 19 since your procedure?  no   If yes to any of these questions please route to Joylene John, RN and Alphonsa Gin, Therapist, sports.

## 2019-02-17 ENCOUNTER — Other Ambulatory Visit: Payer: Self-pay | Admitting: Internal Medicine

## 2019-02-18 ENCOUNTER — Encounter: Payer: Self-pay | Admitting: Gastroenterology

## 2019-02-18 ENCOUNTER — Other Ambulatory Visit: Payer: Self-pay | Admitting: Internal Medicine

## 2019-03-17 ENCOUNTER — Ambulatory Visit: Payer: BC Managed Care – PPO | Admitting: Family Medicine

## 2019-04-01 ENCOUNTER — Ambulatory Visit: Payer: BC Managed Care – PPO | Admitting: Internal Medicine

## 2019-04-21 ENCOUNTER — Ambulatory Visit (INDEPENDENT_AMBULATORY_CARE_PROVIDER_SITE_OTHER): Payer: BC Managed Care – PPO | Admitting: Internal Medicine

## 2019-04-21 ENCOUNTER — Encounter: Payer: Self-pay | Admitting: Internal Medicine

## 2019-04-21 ENCOUNTER — Other Ambulatory Visit: Payer: Self-pay

## 2019-04-21 VITALS — BP 138/88 | HR 82 | Temp 98.7°F | Ht 68.0 in | Wt 235.8 lb

## 2019-04-21 DIAGNOSIS — F4321 Adjustment disorder with depressed mood: Secondary | ICD-10-CM | POA: Diagnosis not present

## 2019-04-21 DIAGNOSIS — E039 Hypothyroidism, unspecified: Secondary | ICD-10-CM

## 2019-04-21 DIAGNOSIS — E119 Type 2 diabetes mellitus without complications: Secondary | ICD-10-CM | POA: Diagnosis not present

## 2019-04-21 DIAGNOSIS — F4320 Adjustment disorder, unspecified: Secondary | ICD-10-CM | POA: Insufficient documentation

## 2019-04-21 DIAGNOSIS — N183 Chronic kidney disease, stage 3 unspecified: Secondary | ICD-10-CM | POA: Diagnosis not present

## 2019-04-21 LAB — LIPID PANEL
Cholesterol: 123 mg/dL (ref 0–200)
HDL: 46.3 mg/dL (ref >39.00)
LDL Cholesterol: 45 mg/dL (ref 0–99)
NonHDL: 76.3
Total CHOL/HDL Ratio: 3
Triglycerides: 155 mg/dL — ABNORMAL HIGH (ref 0.0–149.0)
VLDL: 31 mg/dL (ref 0.0–40.0)

## 2019-04-21 LAB — HEPATIC FUNCTION PANEL
ALT: 17 U/L (ref 0–35)
AST: 17 U/L (ref 0–37)
Albumin: 4.5 g/dL (ref 3.5–5.2)
Alkaline Phosphatase: 56 U/L (ref 39–117)
Bilirubin, Direct: 0.3 mg/dL (ref 0.0–0.3)
Total Bilirubin: 1.5 mg/dL — ABNORMAL HIGH (ref 0.2–1.2)
Total Protein: 7.2 g/dL (ref 6.0–8.3)

## 2019-04-21 LAB — BASIC METABOLIC PANEL
BUN: 21 mg/dL (ref 6–23)
CO2: 30 mEq/L (ref 19–32)
Calcium: 9.7 mg/dL (ref 8.4–10.5)
Chloride: 101 mEq/L (ref 96–112)
Creatinine, Ser: 1.26 mg/dL — ABNORMAL HIGH (ref 0.40–1.20)
GFR: 42.66 mL/min — ABNORMAL LOW (ref >60.00)
Glucose, Bld: 96 mg/dL (ref 70–99)
Potassium: 3.6 mEq/L (ref 3.5–5.1)
Sodium: 138 mEq/L (ref 135–145)

## 2019-04-21 MED ORDER — ALPRAZOLAM 0.5 MG PO TABS: 0.5000 mg | ORAL_TABLET | Freq: Every evening | ORAL | 1 refills | Status: DC | PRN

## 2019-04-21 NOTE — Progress Notes (Signed)
Subjective:    Patient ID: Savannah [name], female    DOB: 1955/06/09, 64 y.o.   MRN: [id number]  HPI  Here to f/u; overall doing ok,  Pt denies chest pain, increasing sob or doe, wheezing, orthopnea, PND, increased LE swelling, palpitations, dizziness or syncope.  Pt denies new neurological symptoms such as new headache, or facial or extremity weakness or numbness.  Pt denies polydipsia, polyuria, or low sugar episode.  Pt states overall good compliance with meds, mostly trying to follow appropriate diet, with wt overall stable,  but little exercise however.  Also daughter passed away sudden death 2 wks ago, greiving and nervous Past Medical History:  Diagnosis Date  . Allergic rhinitis   . Asthma   . B12 deficiency   . Cancer ([place]) 2016   kidney   . Depression   . Grade I internal hemorrhoids   . Hiatal hernia   . History of colon polyps   . History of kidney stones   . 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 Hypercholesterolemia   . Hypertension   . Hypothyroidism    endocrinologist -  dr Hale Bogus  . Iron deficiency anemia   . Lumbar spondylosis   . NAFLD (nonalcoholic fatty liver disease)    dx 2010--- last hepatic panel in epic dated 04-17-2017  . Nocturia   . OA (osteoarthritis of spine)    Lumbar  . OSA on CPAP pulmologist-  dr sood   sleep study 09-11-2004  very severe osa , AHI 119/hr,  setting 10  . Peripheral neuropathy    bottom of feet  . Renal calculus, right   . Sleep apnea    uses CPAP  . Type 2 diabetes mellitus ([place])    endocrinoloigst-  dr Loanne Drilling,  last A1c 5.9 on 02-20-219 in epic  . Vitamin B 12 deficiency   . Wears glasses    Past Surgical History:  Procedure Laterality Date  . BUNIONECTOMY/  HAMMERTOE CORRECTION'S  09-07-2013   SCG   RIGHT FOOT 2ND, 3RD, 4TH, 5TH DIGITS  . CARDIAC CATHETERIZATION   06-01-2009  dr Angelena Form   no evidence CAD,  normal LVSF, elevated blood pressure w/ elevated end-diastolic pressure, ef [id number]  . CARDIOVASCULAR STRESS TEST  05-11-2009   dr Angelena Form   abnormal lexiscan nuclear study (no exercise) w/ mild ischemia in the distal anteroseptal wall and apex, this defect may be due to shifting breast attenuation  . COLONOSCOPY  last 02/08/2014  . COLONOSCOPY WITH SNARE POLYPECTOMY WITH ESOPHAGOGASTRODUODENOSCOPY  02/08/2014   with biopsy Dr. Jerilynn Mages. Fuller Plan  . CYSTOSCOPY WITH RETROGRADE PYELOGRAM, URETEROSCOPY AND STENT PLACEMENT Right 06/07/2017   Procedure: CYSTOSCOPY WITH RETROGRADE PYELOGRAM, URETEROSCOPY, LITHOTRIPSY,  AND STENT PLACEMENT 1st STAGE;  Surgeon: Alexis Frock, MD;  Location: [facility];  Service: Urology;  Laterality: Right;  . CYSTOSCOPY WITH RETROGRADE PYELOGRAM, URETEROSCOPY AND STENT PLACEMENT Right 06/21/2017   Procedure: CYSTOSCOPY WITH RETROGRADE PYELOGRAM, URETEROSCOPY AND STENT PLACEMENT 2ND STAGE  STENT" EXCHANGE";  Surgeon: Alexis Frock, MD;  Location: [facility];  Service: Urology;  Laterality: Right;  . FRACTURE SURGERY Right    Compound fracture foot  . HOLMIUM LASER APPLICATION Right [id number]   Procedure: HOLMIUM LASER APPLICATION;  Surgeon: Alexis Frock, MD;  Location: [facility];  Service: Urology;  Laterality: Right;  . HOLMIUM  LASER APPLICATION Right [id number]   Procedure: HOLMIUM LASER APPLICATION;  Surgeon: Alexis Frock, MD;  Location: [facility];  Service: Urology;  Laterality: Right;  . KNEE ARTHROSCOPY Left 1993  . POLYPECTOMY    . ROBOTIC ASSITED PARTIAL NEPHRECTOMY Right 05/19/2014   Procedure: ROBOTIC ASSITED PARTIAL NEPHRECTOMY;  Surgeon: Alexis Frock, MD;  Location: [facility];  Service: Urology;  Laterality: Right;    reports that she quit smoking about 44 years ago. Her smoking use included cigarettes. She has a 0.60 pack-year smoking history. She has  never used smokeless tobacco. She reports that she does not drink alcohol or use drugs. family history includes Allergies in her sister; Asthma in her sister; Clotting disorder in her grandchild; Colon cancer (age of onset: 28) in her mother; Colon polyps in her mother; Diabetes in her mother; Heart attack in her father; Heart disease in her father and paternal grandmother; Kidney disease in her mother. Allergies  Allergen Reactions  . Erythromycin Nausea And Vomiting   Current Outpatient Medications on File Prior to Visit  Medication Sig Dispense Refill  . ADVAIR DISKUS 100-50 MCG/DOSE AEPB TAKE 1 PUFF BY MOUTH TWICE A DAY 180 each 3  . albuterol (PROAIR HFA) 108 (90 Base) MCG/ACT inhaler Inhale 2 puffs into the lungs every 4 (four) hours as needed for wheezing or shortness of breath (cough). 3 Inhaler 3  . atorvastatin (LIPITOR) 40 MG tablet TAKE 1 TABLET BY MOUTH EVERY DAY 90 tablet 1  . cholecalciferol (VITAMIN D) 1000 units tablet Take 1,000 Units by mouth 2 (two) times daily.    . citalopram (CELEXA) 20 MG tablet TAKE 1 TABLET BY MOUTH EVERY DAY IN THE MORNING 90 tablet 1  . gabapentin (NEURONTIN) 300 MG capsule TAKE 1 CAPSULE BY MOUTH EVERYDAY AT BEDTIME 90 capsule 3  . losartan-hydrochlorothiazide (HYZAAR) 100-12.5 MG tablet TAKE 1 TABLET BY MOUTH EVERY DAY 90 tablet 1  . metFORMIN (GLUCOPHAGE-XR) 500 MG 24 hr tablet TAKE 2 TABLETS BY MOUTH TWICE A DAY 360 tablet 1  . metoprolol succinate (TOPROL-XL) 25 MG 24 hr tablet TAKE 1/2 TABLET BY MOUTH EVERY DAY 45 tablet 3  . Na Sulfate-K Sulfate-Mg Sulf 17.5-3.13-1.6 GM/177ML SOLN Suprep (no substitutions)-TAKE AS DIRECTED. 354 mL 0  . ondansetron (ZOFRAN-ODT) 4 MG disintegrating tablet Take 1 tablet (4 mg total) by mouth every 8 (eight) hours as needed for nausea or vomiting. 20 tablet 0  . Oxycodone HCl 10 MG TABS Take 10 mg by mouth daily as needed (pain).   0  . pioglitazone (ACTOS) 15 MG tablet TAKE 1 TABLET BY MOUTH EVERY DAY 90 tablet 1    . XIIDRA 5 % SOLN Place 1 drop into both eyes 2 (two) times daily.  3  . Cyanocobalamin (B-12 COMPLIANCE INJECTION IJ) Inject as directed every 30 (thirty) days.    Marland Kitchen glucose blood (ONETOUCH VERIO) test strip Use to check blood sugar 1 time per day. (Patient not taking: Reported on 02/12/2019) 100 each 3  . hyoscyamine (LEVSIN SL) 0.125 MG SL tablet Take 1-2 tablets by mouth or under tongue every 4 hours as needed. (Patient taking differently: Take 0.125-0.25 mg by mouth every 4 (four) hours as needed for cramping (stomach spasms). ) 100 tablet 5  . levothyroxine (SYNTHROID) 137 MCG tablet TAKE 1 TABLET (137 MCG TOTAL) BY MOUTH DAILY BEFORE BREAKFAST. (Patient not taking: Reported on 04/21/2019) 90 tablet 1  . metFORMIN (GLUCOPHAGE) 500 MG tablet Take 2 tablets (1,000 mg total) by mouth 2 (two)  times daily with a meal. 360 tablet 3  . ONETOUCH DELICA LANCETS FINE MISC Use to check blood sugar 1 time per day. (Patient not taking: Reported on 04/21/2019) 100 each 2   No current facility-administered medications on file prior to visit.   Review of Systems All otherwise neg per pt     Objective:   Physical Exam BP 138/88 (BP Location: Left Arm, Patient Position: Sitting, Cuff Size: Large)   Pulse 82   Temp 98.7 F (37.1 C) (Oral)   Ht 5\' 8"  (1.727 m)   Wt 235 lb 12.8 oz (107 kg)   SpO2 96%   BMI 35.85 kg/m  VS noted,  Constitutional: Pt appears in NAD HENT: Head: NCAT.  Right Ear: External ear normal.  Left Ear: External ear normal.  Eyes: . Pupils are equal, round, and reactive to light. Conjunctivae and EOM are normal Nose: without d/c or deformity Neck: Neck supple. Gross normal ROM Cardiovascular: Normal rate and regular rhythm.   Pulmonary/Chest: Effort normal and breath sounds without rales or wheezing.  Abd:  Soft, NT, ND, + BS, no organomegaly Neurological: Pt is alert. At baseline orientation, motor grossly intact Skin: Skin is warm. No rashes, other new lesions, no LE  edema Psychiatric: Pt behavior is normal without agitation  All otherwise neg per pt Lab Results  Component Value Date   WBC 8.1 09/22/2018   HGB 13.0 09/22/2018   HCT 38.7 09/22/2018   PLT 231.0 09/22/2018   GLUCOSE 133 (H) 09/22/2018   CHOL 139 09/22/2018   TRIG 198.0 (H) 09/22/2018   HDL 54.90 09/22/2018   LDLDIRECT 88.0 01/14/2015   LDLCALC 45 09/22/2018   ALT 17 09/22/2018   AST 18 09/22/2018   NA 140 09/22/2018   K 4.0 09/22/2018   CL 101 09/22/2018   CREATININE 1.21 (H) 09/22/2018   BUN 21 09/22/2018   CO2 27 09/22/2018   TSH 2.32 09/22/2018   INR 1.1 ratio (H) 05/23/2009   HGBA1C 5.8 09/22/2018   MICROALBUR 4.7 (H) 09/22/2018      Assessment & Plan:

## 2019-04-21 NOTE — Assessment & Plan Note (Signed)
stable overall by history and exam, recent data reviewed with pt, and pt to continue medical treatment as before,  to f/u any worsening symptoms or concerns  

## 2019-04-21 NOTE — Assessment & Plan Note (Addendum)
stable overall by history and exam, recent data reviewed with pt, and pt to continue medical treatment as before,  to f/u any worsening symptoms or concerns, for renal consult  I spent 32 minutes in preparing to see the patient by review of recent labs, imaging and procedures, obtaining and reviewing separately obtained history, communicating with the patient and family or caregiver, ordering medications, tests or procedures, and documenting clinical information in the EHR including the differential Dx, treatment, and any further evaluation and other management of CKD 3, DM, grief, and hypothyroidism

## 2019-04-21 NOTE — Assessment & Plan Note (Signed)
For xanax bid prn,  to f/u any worsening symptoms or concerns, cont celexa

## 2019-04-21 NOTE — Assessment & Plan Note (Signed)
stable overall by history and exam, recent data reviewed with pt, and pt to continue medical treatment as before,  to f/u any worsening symptoms or concerns le  

## 2019-04-21 NOTE — Patient Instructions (Signed)
Please take all new medication as prescribed - the xanax as needed ° °Please continue all other medications as before, and refills have been done if requested. ° °Please have the pharmacy call with any other refills you may need. ° °Please continue your efforts at being more active, low cholesterol diet, and weight control. ° °You are otherwise up to date with prevention measures today. ° °Please keep your appointments with your specialists as you may have planned ° °Please go to the LAB at the blood drawing area for the tests to be done ° °You will be contacted by phone if any changes need to be made immediately.  Otherwise, you will receive a letter about your results with an explanation, but please check with MyChart first. ° °Please remember to sign up for MyChart if you have not done so, as this will be important to you in the future with finding out test results, communicating by private email, and scheduling acute appointments online when needed. ° °Please make an Appointment to return in 6 months, or sooner if needed °

## 2019-04-22 LAB — HEMOGLOBIN A1C: Hgb A1c MFr Bld: 6 % (ref 4.6–6.5)

## 2019-05-10 ENCOUNTER — Other Ambulatory Visit: Payer: Self-pay | Admitting: Internal Medicine

## 2019-05-10 NOTE — Telephone Encounter (Signed)
Please refill as per office routine med refill policy (all routine meds refilled for 3 mo or monthly per pt preference up to one year from last visit, then month to month grace period for 3 mo, then further med refills will have to be denied)  

## 2019-08-05 ENCOUNTER — Other Ambulatory Visit: Payer: Self-pay | Admitting: Internal Medicine

## 2019-08-05 NOTE — Telephone Encounter (Signed)
Please refill as per office routine med refill policy (all routine meds refilled for 3 mo or monthly per pt preference up to one year from last visit, then month to month grace period for 3 mo, then further med refills will have to be denied)  

## 2019-08-12 ENCOUNTER — Other Ambulatory Visit: Payer: Self-pay | Admitting: Internal Medicine

## 2019-08-12 DIAGNOSIS — E119 Type 2 diabetes mellitus without complications: Secondary | ICD-10-CM

## 2019-08-14 ENCOUNTER — Other Ambulatory Visit: Payer: Self-pay | Admitting: Internal Medicine

## 2019-08-14 NOTE — Telephone Encounter (Signed)
Please refill as per office routine med refill policy (all routine meds refilled for 3 mo or monthly per pt preference up to one year from last visit, then month to month grace period for 3 mo, then further med refills will have to be denied)  

## 2019-08-17 ENCOUNTER — Other Ambulatory Visit: Payer: Self-pay | Admitting: Internal Medicine

## 2019-09-20 ENCOUNTER — Other Ambulatory Visit: Payer: Self-pay | Admitting: Internal Medicine

## 2019-09-20 NOTE — Telephone Encounter (Signed)
Please refill as per office routine med refill policy (all routine meds refilled for 3 mo or monthly per pt preference up to one year from last visit, then month to month grace period for 3 mo, then further med refills will have to be denied)  

## 2019-10-16 ENCOUNTER — Encounter: Payer: Self-pay | Admitting: Internal Medicine

## 2019-10-16 ENCOUNTER — Other Ambulatory Visit: Payer: Self-pay

## 2019-10-16 ENCOUNTER — Ambulatory Visit: Payer: BC Managed Care – PPO | Admitting: Internal Medicine

## 2019-10-16 VITALS — BP 140/68 | HR 63 | Temp 98.4°F | Ht 68.0 in | Wt 228.0 lb

## 2019-10-16 DIAGNOSIS — E559 Vitamin D deficiency, unspecified: Secondary | ICD-10-CM

## 2019-10-16 DIAGNOSIS — Z0001 Encounter for general adult medical examination with abnormal findings: Secondary | ICD-10-CM | POA: Diagnosis not present

## 2019-10-16 DIAGNOSIS — Z23 Encounter for immunization: Secondary | ICD-10-CM

## 2019-10-16 DIAGNOSIS — Z0000 Encounter for general adult medical examination without abnormal findings: Secondary | ICD-10-CM | POA: Diagnosis not present

## 2019-10-16 DIAGNOSIS — E538 Deficiency of other specified B group vitamins: Secondary | ICD-10-CM

## 2019-10-16 DIAGNOSIS — E119 Type 2 diabetes mellitus without complications: Secondary | ICD-10-CM | POA: Diagnosis not present

## 2019-10-16 LAB — COMPLETE METABOLIC PANEL WITH GFR
AG Ratio: 2.1 (calc) (ref 1.0–2.5)
ALT: 14 U/L (ref 6–29)
AST: 15 U/L (ref 10–35)
Albumin: 4.2 g/dL (ref 3.6–5.1)
Alkaline phosphatase (APISO): 61 U/L (ref 37–153)
BUN/Creatinine Ratio: 14 (calc) (ref 6–22)
BUN: 15 mg/dL (ref 7–25)
CO2: 25 mmol/L (ref 20–32)
Calcium: 9.1 mg/dL (ref 8.6–10.4)
Chloride: 107 mmol/L (ref 98–110)
Creat: 1.08 mg/dL — ABNORMAL HIGH (ref 0.50–0.99)
GFR, Est African American: 62 mL/min/{1.73_m2} (ref >=60)
GFR, Est Non African American: 54 mL/min/{1.73_m2} — ABNORMAL LOW (ref >=60)
Globulin: 2 g/dL (calc) (ref 1.9–3.7)
Glucose, Bld: 124 mg/dL — ABNORMAL HIGH (ref 65–99)
Potassium: 3.8 mmol/L (ref 3.5–5.3)
Sodium: 140 mmol/L (ref 135–146)
Total Bilirubin: 1.1 mg/dL (ref 0.2–1.2)
Total Protein: 6.2 g/dL (ref 6.1–8.1)

## 2019-10-16 NOTE — Progress Notes (Signed)
Subjective:    Patient ID: Savannah [name], female    DOB: 1956/02/22, 64 y.o.   MRN: [id number]  HPI  Here for wellness and f/u;  Overall doing ok;  Pt denies Chest pain, worsening SOB, DOE, wheezing, orthopnea, PND, worsening LE edema, palpitations, dizziness or syncope.  Pt denies neurological change such as new headache, facial or extremity weakness.  Pt denies polydipsia, polyuria, or low sugar symptoms. Pt states overall good compliance with treatment and medications, good tolerability, and has been trying to follow appropriate diet.  Pt denies worsening depressive symptoms, suicidal ideation or panic. No fever, night sweats, wt loss, loss of appetite, or other constitutional symptoms.  Pt states good ability with ADL's, has low fall risk, home safety reviewed and adequate, no other significant changes in hearing or vision, and only occasionally active with exercise.  Due for prevnar 13 Past Medical History:  Diagnosis Date  . Allergic rhinitis   . Asthma   . B12 deficiency   . Cancer ([place]) 2016   kidney   . Depression   . Grade I internal hemorrhoids   . Hiatal hernia   . History of colon polyps   . History of kidney stones   . 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 Hypercholesterolemia   . Hypertension   . Hypothyroidism    endocrinologist -  dr Hale Bogus  . Iron deficiency anemia   . Lumbar spondylosis   . NAFLD (nonalcoholic fatty liver disease)    dx 2010--- last hepatic panel in epic dated 04-17-2017  . Nocturia   . OA (osteoarthritis of spine)    Lumbar  . OSA on CPAP pulmologist-  dr sood   sleep study 09-11-2004  very severe osa , AHI 119/hr,  setting 10  . Peripheral neuropathy    bottom of feet  . Renal calculus, right   . Sleep apnea    uses CPAP  . Type 2 diabetes mellitus ([place])    endocrinoloigst-  dr  Loanne Drilling,  last A1c 5.9 on 02-20-219 in epic  . Vitamin B 12 deficiency   . Wears glasses    Past Surgical History:  Procedure Laterality Date  . BUNIONECTOMY/  HAMMERTOE CORRECTION'S  09-07-2013   SCG   RIGHT FOOT 2ND, 3RD, 4TH, 5TH DIGITS  . CARDIAC CATHETERIZATION  06-01-2009  dr Angelena Form   no evidence CAD,  normal LVSF, elevated blood pressure w/ elevated end-diastolic pressure, ef 62-13%  . CARDIOVASCULAR STRESS TEST  05-11-2009   dr Angelena Form   abnormal lexiscan nuclear study (no exercise) w/ mild ischemia in the distal anteroseptal wall and apex, this defect may be due to shifting breast attenuation  . COLONOSCOPY  last 02/08/2014  . COLONOSCOPY WITH SNARE POLYPECTOMY WITH ESOPHAGOGASTRODUODENOSCOPY  02/08/2014   with biopsy Dr. Jerilynn Mages. Fuller Plan  . CYSTOSCOPY WITH RETROGRADE PYELOGRAM, URETEROSCOPY AND STENT PLACEMENT Right 06/07/2017   Procedure: CYSTOSCOPY WITH RETROGRADE PYELOGRAM, URETEROSCOPY, LITHOTRIPSY,  AND STENT PLACEMENT 1st STAGE;  Surgeon: Alexis Frock, MD;  Location: [facility];  Service: Urology;  Laterality: Right;  . CYSTOSCOPY WITH RETROGRADE PYELOGRAM, URETEROSCOPY AND STENT PLACEMENT Right 06/21/2017   Procedure: CYSTOSCOPY WITH RETROGRADE PYELOGRAM, URETEROSCOPY AND STENT PLACEMENT 2ND STAGE  STENT" EXCHANGE";  Surgeon: Alexis Frock, MD;  Location: [facility];  Service: Urology;  Laterality: Right;  . FRACTURE SURGERY Right  Compound fracture foot  . HOLMIUM LASER APPLICATION Right 04/09/9415   Procedure: HOLMIUM LASER APPLICATION;  Surgeon: Alexis Frock, MD;  Location: [facility];  Service: Urology;  Laterality: Right;  . HOLMIUM LASER APPLICATION Right 06/04/1446   Procedure: HOLMIUM LASER APPLICATION;  Surgeon: Alexis Frock, MD;  Location: [facility];  Service: Urology;  Laterality: Right;  . KNEE ARTHROSCOPY Left 1993  . POLYPECTOMY    . ROBOTIC ASSITED PARTIAL NEPHRECTOMY Right 05/19/2014    Procedure: ROBOTIC ASSITED PARTIAL NEPHRECTOMY;  Surgeon: Alexis Frock, MD;  Location: [facility];  Service: Urology;  Laterality: Right;    reports that she quit smoking about 44 years ago. Her smoking use included cigarettes. She has a 0.60 pack-year smoking history. She has never used smokeless tobacco. She reports that she does not drink alcohol and does not use drugs. family history includes Allergies in her sister; Asthma in her sister; Clotting disorder in her grandchild; Colon cancer (age of onset: 54) in her mother; Colon polyps in her mother; Diabetes in her mother; Heart attack in her father; Heart disease in her father and paternal grandmother; Kidney disease in her mother. Allergies  Allergen Reactions  . Erythromycin Nausea And Vomiting   , Current Outpatient Medications on File Prior to Visit  Medication Sig Dispense Refill  . ADVAIR DISKUS 100-50 MCG/DOSE AEPB TAKE 1 PUFF BY MOUTH TWICE A DAY 180 each 3  . albuterol (PROAIR HFA) 108 (90 Base) MCG/ACT inhaler Inhale 2 puffs into the lungs every 4 (four) hours as needed for wheezing or shortness of breath (cough). 3 Inhaler 3  . ALPRAZolam (XANAX) 0.5 MG tablet Take 1 tablet (0.5 mg total) by mouth at bedtime as needed for anxiety. 60 tablet 1  . atorvastatin (LIPITOR) 40 MG tablet TAKE 1 TABLET BY MOUTH EVERY DAY 90 tablet 1  . cholecalciferol (VITAMIN D) 1000 units tablet Take 1,000 Units by mouth 2 (two) times daily.    . citalopram (CELEXA) 20 MG tablet TAKE 1 TABLET BY MOUTH EVERY DAY IN THE MORNING 90 tablet 2  . FARXIGA 5 MG TABS tablet Take 5 mg by mouth daily.    Marland Kitchen gabapentin (NEURONTIN) 300 MG capsule TAKE 1 CAPSULE BY MOUTH EVERYDAY AT BEDTIME 90 capsule 3  . hyoscyamine (LEVSIN SL) 0.125 MG SL tablet Take 1-2 tablets by mouth or under tongue every 4 hours as needed. (Patient taking differently: Take 0.125-0.25 mg by mouth every 4 (four) hours as needed for cramping (stomach spasms). ) 100 tablet 5  . levothyroxine  (SYNTHROID) 137 MCG tablet TAKE 1 TABLET (137 MCG TOTAL) BY MOUTH DAILY BEFORE BREAKFAST. 90 tablet 1  . losartan (COZAAR) 100 MG tablet Take 100 mg by mouth daily.    . metFORMIN (GLUCOPHAGE-XR) 500 MG 24 hr tablet TAKE 2 TABLETS BY MOUTH TWICE A DAY 360 tablet 1  . metoprolol succinate (TOPROL-XL) 25 MG 24 hr tablet TAKE 1/2 TABLET BY MOUTH EVERY DAY 45 tablet 3  . ondansetron (ZOFRAN-ODT) 4 MG disintegrating tablet Take 1 tablet (4 mg total) by mouth every 8 (eight) hours as needed for nausea or vomiting. 20 tablet 0  . Oxycodone HCl 10 MG TABS Take 10 mg by mouth daily as needed (pain).   0  . traZODone (DESYREL) 100 MG tablet TAKE 1 TABLET BY MOUTH EVERYDAY AT BEDTIME 90 tablet 1  . XIIDRA 5 % SOLN Place 1 drop into both eyes 2 (two) times daily.  3   No current facility-administered medications  on file prior to visit.   Review of Systems All otherwise neg per pt     Objective:   Physical Exam BP 140/68 (BP Location: Left Arm, Patient Position: Sitting, Cuff Size: Large)   Pulse 63   Temp 98.4 F (36.9 C) (Oral)   Ht 5\' 8"  (1.727 m)   Wt 228 lb (103.4 kg)   SpO2 96%   BMI 34.67 kg/m  VS noted,  Constitutional: Pt appears in NAD HENT: Head: NCAT.  Right Ear: External ear normal.  Left Ear: External ear normal.  Eyes: . Pupils are equal, round, and reactive to light. Conjunctivae and EOM are normal Nose: without d/c or deformity Neck: Neck supple. Gross normal ROM Cardiovascular: Normal rate and regular rhythm.   Pulmonary/Chest: Effort normal and breath sounds without rales or wheezing.  Abd:  Soft, NT, ND, + BS, no organomegaly Neurological: Pt is alert. At baseline orientation, motor grossly intact Skin: Skin is warm. No rashes, other new lesions, no LE edema Psychiatric: Pt behavior is normal without agitation  Lab Results  Component Value Date   WBC 6.4 10/16/2019   HGB 11.9 10/16/2019   HCT 35.5 10/16/2019   PLT 170 10/16/2019   GLUCOSE 124 (H) 10/16/2019    CHOL 125 10/16/2019   TRIG 145 10/16/2019   HDL 49 (L) 10/16/2019   LDLDIRECT 88.0 01/14/2015   LDLCALC 53 10/16/2019   ALT 14 10/16/2019   AST 15 10/16/2019   NA 140 10/16/2019   K 3.8 10/16/2019   CL 107 10/16/2019   CREATININE 1.08 (H) 10/16/2019   BUN 15 10/16/2019   CO2 25 10/16/2019   TSH 0.42 10/16/2019   INR 1.1 ratio (H) 05/23/2009   HGBA1C 6.0 (H) 10/16/2019   MICROALBUR 1.1 10/16/2019          Assessment & Plan:

## 2019-10-16 NOTE — Patient Instructions (Signed)
You had the Prevnar 13 pneumonia shot today  Please continue all other medications as before, and refills have been done if requested.  Please have the pharmacy call with any other refills you may need.  Please continue your efforts at being more active, low cholesterol diet, and weight control.  You are otherwise up to date with prevention measures today.  Please keep your appointments with your specialists as you may have planned  Please go to the LAB at the blood drawing area for the tests to be done  You will be contacted by phone if any changes need to be made immediately.  Otherwise, you will receive a letter about your results with an explanation, but please check with MyChart first.  Please remember to sign up for MyChart if you have not done so, as this will be important to you in the future with finding out test results, communicating by private email, and scheduling acute appointments online when needed.  Please make an Appointment to return in 6 months, or sooner if needed 

## 2019-10-17 ENCOUNTER — Encounter: Payer: Self-pay | Admitting: Internal Medicine

## 2019-10-17 LAB — CBC WITH DIFFERENTIAL/PLATELET
Absolute Monocytes: 390 cells/uL (ref 200–950)
Basophils Absolute: 19 cells/uL (ref 0–200)
Basophils Relative: 0.3 %
Eosinophils Absolute: 179 cells/uL (ref 15–500)
Eosinophils Relative: 2.8 %
HCT: 35.5 % (ref 35.0–45.0)
Hemoglobin: 11.9 g/dL (ref 11.7–15.5)
Lymphs Abs: 1562 cells/uL (ref 850–3900)
MCH: 28.8 pg (ref 27.0–33.0)
MCHC: 33.5 g/dL (ref 32.0–36.0)
MCV: 86 fL (ref 80.0–100.0)
MPV: 9.6 fL (ref 7.5–12.5)
Monocytes Relative: 6.1 %
Neutro Abs: 4250 cells/uL (ref 1500–7800)
Neutrophils Relative %: 66.4 %
Platelets: 170 10*3/uL (ref 140–400)
RBC: 4.13 10*6/uL (ref 3.80–5.10)
RDW: 13.5 % (ref 11.0–15.0)
Total Lymphocyte: 24.4 %
WBC: 6.4 10*3/uL (ref 3.8–10.8)

## 2019-10-17 LAB — TSH: TSH: 0.42 mIU/L (ref 0.40–4.50)

## 2019-10-17 LAB — URINALYSIS, ROUTINE W REFLEX MICROSCOPIC
Bilirubin Urine: NEGATIVE
Hgb urine dipstick: NEGATIVE
Hyaline Cast: NONE SEEN /LPF
Ketones, ur: NEGATIVE
Nitrite: NEGATIVE
Protein, ur: NEGATIVE
Specific Gravity, Urine: 1.031 (ref 1.001–1.03)
pH: 5.5 (ref 5.0–8.0)

## 2019-10-17 LAB — MICROALBUMIN / CREATININE URINE RATIO
Creatinine, Urine: 132 mg/dL (ref 20–275)
Microalb Creat Ratio: 8 mcg/mg creat (ref <30)
Microalb, Ur: 1.1 mg/dL

## 2019-10-17 LAB — HEMOGLOBIN A1C
Hgb A1c MFr Bld: 6 % of total Hgb — ABNORMAL HIGH (ref <5.7)
Mean Plasma Glucose: 126 (calc)
eAG (mmol/L): 7 (calc)

## 2019-10-17 LAB — VITAMIN B12: Vitamin B-12: 312 pg/mL (ref 200–1100)

## 2019-10-17 LAB — VITAMIN D 25 HYDROXY (VIT D DEFICIENCY, FRACTURES): Vit D, 25-Hydroxy: 49 ng/mL (ref 30–100)

## 2019-10-17 LAB — LIPID PANEL
Cholesterol: 125 mg/dL (ref <200)
HDL: 49 mg/dL — ABNORMAL LOW (ref >=50)
LDL Cholesterol (Calc): 53 mg/dL (calc)
Non-HDL Cholesterol (Calc): 76 mg/dL (calc) (ref <130)
Total CHOL/HDL Ratio: 2.6 (calc) (ref <5.0)
Triglycerides: 145 mg/dL (ref <150)

## 2019-10-17 NOTE — Assessment & Plan Note (Signed)
stable overall by history and exam, recent data reviewed with pt, and pt to continue medical treatment as before,  to f/u any worsening symptoms or concerns  

## 2019-10-17 NOTE — Assessment & Plan Note (Signed)

## 2019-10-17 NOTE — Assessment & Plan Note (Signed)
For oral replacement 

## 2019-10-19 ENCOUNTER — Ambulatory Visit: Payer: BC Managed Care – PPO | Admitting: Internal Medicine

## 2019-10-20 ENCOUNTER — Ambulatory Visit: Payer: BC Managed Care – PPO | Admitting: Internal Medicine

## 2019-11-05 ENCOUNTER — Other Ambulatory Visit: Payer: Self-pay | Admitting: Internal Medicine

## 2019-12-09 ENCOUNTER — Ambulatory Visit (INDEPENDENT_AMBULATORY_CARE_PROVIDER_SITE_OTHER): Payer: BC Managed Care – PPO

## 2019-12-09 ENCOUNTER — Other Ambulatory Visit: Payer: Self-pay

## 2019-12-09 DIAGNOSIS — Z23 Encounter for immunization: Secondary | ICD-10-CM

## 2019-12-09 NOTE — Progress Notes (Signed)
Flu vaccine given w/o any complications  

## 2019-12-18 ENCOUNTER — Other Ambulatory Visit: Payer: Self-pay | Admitting: Internal Medicine

## 2019-12-18 DIAGNOSIS — E119 Type 2 diabetes mellitus without complications: Secondary | ICD-10-CM

## 2019-12-18 NOTE — Telephone Encounter (Signed)
Please refill as per office routine med refill policy (all routine meds refilled for 3 mo or monthly per pt preference up to one year from last visit, then month to month grace period for 3 mo, then further med refills will have to be denied)  

## 2019-12-27 ENCOUNTER — Other Ambulatory Visit: Payer: Self-pay | Admitting: Internal Medicine

## 2020-01-05 ENCOUNTER — Telehealth: Payer: Self-pay | Admitting: Internal Medicine

## 2020-01-05 NOTE — Telephone Encounter (Signed)
Patient got her covid booster on Friday Nov 5th and got tested for covid on Monday Nov 8th and it was positive so she was wondering if the booster could make her have a false positive Patient # [phone]

## 2020-01-05 NOTE — Telephone Encounter (Signed)
Spoke with pt and was able to inform that it is most likely she was COVID pos before taking her Booster on Friday. Pt states she has no sxs at all just tested positive.

## 2020-02-03 ENCOUNTER — Encounter: Payer: Self-pay | Admitting: Family Medicine

## 2020-02-03 ENCOUNTER — Ambulatory Visit (INDEPENDENT_AMBULATORY_CARE_PROVIDER_SITE_OTHER): Payer: BC Managed Care – PPO

## 2020-02-03 ENCOUNTER — Ambulatory Visit: Payer: BC Managed Care – PPO | Admitting: Family Medicine

## 2020-02-03 ENCOUNTER — Ambulatory Visit: Payer: Self-pay

## 2020-02-03 ENCOUNTER — Other Ambulatory Visit: Payer: Self-pay

## 2020-02-03 VITALS — BP 130/90 | HR 86 | Ht 68.0 in | Wt 230.0 lb

## 2020-02-03 DIAGNOSIS — M5010 Cervical disc disorder with radiculopathy, unspecified cervical region: Secondary | ICD-10-CM | POA: Insufficient documentation

## 2020-02-03 DIAGNOSIS — G8929 Other chronic pain: Secondary | ICD-10-CM

## 2020-02-03 DIAGNOSIS — M25511 Pain in right shoulder: Secondary | ICD-10-CM | POA: Diagnosis not present

## 2020-02-03 MED ORDER — PREDNISONE 20 MG PO TABS: 20.0000 mg | ORAL_TABLET | Freq: Every day | ORAL | 0 refills | Status: DC

## 2020-02-03 MED ORDER — GABAPENTIN 100 MG PO CAPS: 100.0000 mg | ORAL_CAPSULE | Freq: Two times a day (BID) | ORAL | 3 refills | Status: DC

## 2020-02-03 NOTE — Assessment & Plan Note (Signed)
Patient has known degenerative disc disease especially the C5-C6 and C6-C7 level.  Patient does have signs and symptoms of more of a C8 distribution have some weakness noted on the right side.  Discussed different treatment options and patient has elected to increase gabapentin to 100 mg twice daily with 300 mg at night.  We discussed which activities to doing which wants to avoid.  Patient does have some mild changes of the rotator cuff noted on the right shoulder on ultrasound but very minimal.  Discussed potential injection for diagnostic and therapeutic purposes but patient declined.  Short course of prednisone given.  Follow-up with me again 4 to 6 weeks

## 2020-02-03 NOTE — Progress Notes (Signed)
[facility] [address] [place] Phone: [phone] Subjective:   I Savannah [name] am serving as a Education administrator for Dr. Hulan Saas.  This visit occurred during the SARS-CoV-2 public health emergency.  Safety protocols were in place, including screening questions prior to the visit, additional usage of staff PPE, and extensive cleaning of exam room while observing appropriate contact time as indicated for disinfecting solutions.   I'm seeing this patient by the request  of:  Biagio Borg, MD  CC:   [id number]   01/20/2019 Discussed arthritic changes.  Discussed topical anti-inflammatories.  Discussed which activities to do which wants to avoid.  Patient wants to continue conservative therapy.  Still some mild inflammation surrounding.  Patient does not have any weakness in the rotator cuff.  Patient is 70% better overall.  Discussed which activities to do which wants to avoid.  Topical anti-inflammatories.  Follow-up again in 4 to 8 weeks  02/03/2020 Savannah [name] is a 64 y.o. female coming in with complaint of right shoulder pain. Last seen 01/20/2019 for left shoulder pain. Arm goes numb to the finger tips. Has tried multiple modalities for the pain. States she can't use it a lot due to numbness. Pushing shopping cart and driving causes pain. Been going on for about a month. 8/10 at its worse. Numbness gets worse throughout the day.        Past Medical History:  Diagnosis Date  . Allergic rhinitis   . Asthma   . B12 deficiency   . Cancer ([place]) 2016   kidney   . Depression   . Grade I internal hemorrhoids   . Hiatal hernia   . History of colon polyps   . History of kidney stones   . 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 Hypercholesterolemia   . Hypertension   .  Hypothyroidism    endocrinologist -  dr Hale Bogus  . Iron deficiency anemia   . Lumbar spondylosis   . NAFLD (nonalcoholic fatty liver disease)    dx 2010--- last hepatic panel in epic dated 04-17-2017  . Nocturia   . OA (osteoarthritis of spine)    Lumbar  . OSA on CPAP pulmologist-  dr sood   sleep study 09-11-2004  very severe osa , AHI 119/hr,  setting 10  . Peripheral neuropathy    bottom of feet  . Renal calculus, right   . Sleep apnea    uses CPAP  . Type 2 diabetes mellitus ([place])    endocrinoloigst-  dr Loanne Drilling,  last A1c 5.9 on 02-20-219 in epic  . Vitamin B 12 deficiency   . Wears glasses    Past Surgical History:  Procedure Laterality Date  . BUNIONECTOMY/  HAMMERTOE CORRECTION'S  09-07-2013   [facility]   RIGHT FOOT 2ND, 3RD, 4TH, 5TH DIGITS  . CARDIAC CATHETERIZATION  06-01-2009  dr Angelena Form   no evidence CAD,  normal LVSF, elevated blood pressure w/ elevated end-diastolic pressure, ef 58-09%  . CARDIOVASCULAR STRESS TEST  05-11-2009   dr Angelena Form   abnormal lexiscan nuclear study (no exercise) w/ mild ischemia in the distal anteroseptal wall and apex, this defect may be due to shifting breast attenuation  . COLONOSCOPY  last 02/08/2014  . COLONOSCOPY WITH SNARE POLYPECTOMY WITH ESOPHAGOGASTRODUODENOSCOPY  02/08/2014   with biopsy Dr. Jerilynn Mages.  Karie Mainland WITH RETROGRADE PYELOGRAM, URETEROSCOPY AND STENT PLACEMENT Right 06/07/2017   Procedure: CYSTOSCOPY WITH RETROGRADE PYELOGRAM, URETEROSCOPY, LITHOTRIPSY,  AND STENT PLACEMENT 1st STAGE;  Surgeon: Alexis Frock, MD;  Location: [facility];  Service: Urology;  Laterality: Right;  . CYSTOSCOPY WITH RETROGRADE PYELOGRAM, URETEROSCOPY AND STENT PLACEMENT Right 06/21/2017   Procedure: CYSTOSCOPY WITH RETROGRADE PYELOGRAM, URETEROSCOPY AND STENT PLACEMENT 2ND STAGE  STENT" EXCHANGE";  Surgeon: Alexis Frock, MD;  Location: [facility];  Service: Urology;  Laterality: Right;  . FRACTURE SURGERY  Right    Compound fracture foot  . HOLMIUM LASER APPLICATION Right 10/25/5619   Procedure: HOLMIUM LASER APPLICATION;  Surgeon: Alexis Frock, MD;  Location: [facility];  Service: Urology;  Laterality: Right;  . HOLMIUM LASER APPLICATION Right 05/03/6576   Procedure: HOLMIUM LASER APPLICATION;  Surgeon: Alexis Frock, MD;  Location: [facility];  Service: Urology;  Laterality: Right;  . KNEE ARTHROSCOPY Left 1993  . POLYPECTOMY    . ROBOTIC ASSITED PARTIAL NEPHRECTOMY Right 05/19/2014   Procedure: ROBOTIC ASSITED PARTIAL NEPHRECTOMY;  Surgeon: Alexis Frock, MD;  Location: [facility];  Service: Urology;  Laterality: Right;   Social History   Socioeconomic History  . Marital status: Married    Spouse name: Not on file  . Number of children: 2  . Years of education: Not on file  . Highest education level: Not on file  Occupational History  . Occupation: Scientist, research (physical sciences): UNEMPLOYED  Tobacco Use  . Smoking status: Former Smoker    Packs/day: 0.30    Years: 2.00    Pack years: 0.60    Types: Cigarettes    Quit date: 02/27/1975    Years since quitting: 44.9  . Smokeless tobacco: Never Used  Vaping Use  . Vaping Use: Never used  Substance and Sexual Activity  . Alcohol use: No  . Drug use: No  . Sexual activity: Not on file  Other Topics Concern  . 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 Difficulty of Paying Living Expenses: Not on file  Food Insecurity:   . Worried About Charity fundraiser in the Last Year: Not on file  . Ran Out of Food in the Last Year: Not on file  Transportation Needs:   . Lack of Transportation (Medical): Not on file  . Lack of Transportation (Non-Medical): Not on file  Physical Activity:   . Days of Exercise per Week: Not on file  . Minutes of Exercise per Session: Not on file  Stress:   . Feeling of  Stress : Not on file  Social Connections:   . Frequency of Communication with Friends and Family: Not on file  . Frequency of Social Gatherings with Friends and Family: Not on file  . Attends Religious Services: Not on file  . Active Member of Clubs or Organizations: Not on file  . Attends Archivist Meetings: Not on file  . Marital Status: Not on file   Allergies  Allergen Reactions  . Erythromycin Nausea And Vomiting   Family History  Problem Relation Age of Onset  . Colon cancer Mother 61  . Diabetes Mother   . Kidney disease Mother   . Colon polyps Mother   . Heart attack Father   . Heart disease Father   . Allergies Sister   .  Asthma Sister   . Clotting disorder Grandchild   . Heart disease Paternal Grandmother   . Esophageal cancer Neg Hx   . Rectal cancer Neg Hx   . Stomach cancer Neg Hx     Current Outpatient Medications (Endocrine & Metabolic):  Marland Kitchen  FARXIGA 5 MG TABS tablet, Take 5 mg by mouth daily. Marland Kitchen  levothyroxine (SYNTHROID) 137 MCG tablet, TAKE 1 TABLET (137 MCG TOTAL) BY MOUTH DAILY BEFORE BREAKFAST. .  metFORMIN (GLUCOPHAGE-XR) 500 MG 24 hr tablet, TAKE 2 TABLETS BY MOUTH TWICE A DAY .  predniSONE (DELTASONE) 20 MG tablet, Take 1 tablet (20 mg total) by mouth daily with breakfast.  Current Outpatient Medications (Cardiovascular):  .  atorvastatin (LIPITOR) 40 MG tablet, TAKE 1 TABLET BY MOUTH EVERY DAY .  losartan (COZAAR) 100 MG tablet, Take 100 mg by mouth daily. .  metoprolol succinate (TOPROL-XL) 25 MG 24 hr tablet, TAKE 1/2 TABLET BY MOUTH EVERY DAY  Current Outpatient Medications (Respiratory):  Marland Kitchen  ADVAIR DISKUS 100-50 MCG/DOSE AEPB, TAKE 1 PUFF BY MOUTH TWICE A DAY .  albuterol (PROAIR HFA) 108 (90 Base) MCG/ACT inhaler, Inhale 2 puffs into the lungs every 4 (four) hours as needed for wheezing or shortness of breath (cough).  Current Outpatient Medications (Analgesics):  Marland Kitchen  Oxycodone HCl 10 MG TABS, Take 10 mg by mouth daily as needed  (pain).    Current Outpatient Medications (Other):  Marland Kitchen  ALPRAZolam (XANAX) 0.5 MG tablet, Take 1 tablet (0.5 mg total) by mouth at bedtime as needed for anxiety. .  cholecalciferol (VITAMIN D) 1000 units tablet, Take 1,000 Units by mouth 2 (two) times daily. .  citalopram (CELEXA) 20 MG tablet, TAKE 1 TABLET BY MOUTH EVERY DAY IN THE MORNING .  gabapentin (NEURONTIN) 300 MG capsule, TAKE 1 CAPSULE BY MOUTH EVERYDAY AT BEDTIME .  hyoscyamine (LEVSIN SL) 0.125 MG SL tablet, Take 1-2 tablets by mouth or under tongue every 4 hours as needed. (Patient taking differently: Take 0.125-0.25 mg by mouth every 4 (four) hours as needed for cramping (stomach spasms). ) .  ondansetron (ZOFRAN-ODT) 4 MG disintegrating tablet, Take 1 tablet (4 mg total) by mouth every 8 (eight) hours as needed for nausea or vomiting. .  traZODone (DESYREL) 100 MG tablet, TAKE 1 TABLET BY MOUTH EVERYDAY AT BEDTIME .  XIIDRA 5 % SOLN, Place 1 drop into both eyes 2 (two) times daily. Marland Kitchen  gabapentin (NEURONTIN) 100 MG capsule, Take 1 capsule (100 mg total) by mouth 2 (two) times daily.   Reviewed prior external information including notes and imaging from  primary care provider As well as notes that were available from care everywhere and other healthcare systems.  Past medical history, social, surgical and family history all reviewed in electronic medical record.  No pertanent information unless stated regarding to the chief complaint.   Review of Systems:  No headache, visual changes, nausea, vomiting, diarrhea, constipation, dizziness, abdominal pain, skin rash, fevers, chills, night sweats, weight loss, swollen lymph nodes, body aches, joint swelling, chest pain, shortness of breath, mood changes. POSITIVE muscle aches  Objective  Blood pressure 130/90, pulse 86, height 5\' 8"  (1.727 m), weight 230 lb (104.3 kg), SpO2 97 %.   General: No apparent distress alert and oriented x3 mood and affect normal, dressed appropriately.   HEENT: Pupils equal, extraocular movements intact  Respiratory: Patient's speak in full sentences and does not appear short of breath  Cardiovascular: No lower extremity edema, non tender, no erythema   MSK:  Patient's right shoulder has some very mild positive impingement noted.  Patient's neck though does have a positive Spurling's noted.  Radicular symptoms in the C8 distribution.  Patient does have weakness in the C8 distribution with 3 out of 5 strength on the right compared to 5 out of 5 strength on the left.  Patient is tricep reflex is intact.  Limited musculoskeletal ultrasound was performed and interpreted by Lyndal Pulley  Limited ultrasound of patient's right shoulder shows that there is some degenerative changes of the acromioclavicular joint with a trace effusion noted.  Patient also has some mild degenerative tearing of the rotator cuff but no true acute tear appreciated.  Mild hypoechoic changes consistent with a bursitis as well. Impression: Degenerative changes but no true acute tear    Impression and Recommendations:     The above documentation has been reviewed and is accurate and complete Lyndal Pulley, DO

## 2020-02-03 NOTE — Patient Instructions (Addendum)
Good to see you Right shoulder xray I think it is your neck k We will increase gabapentin to 100mg  in AM, 100mg  in PM and 300mg  at night Prednsoine daily for 7 days, but may need to watch blood sugar but I am sure you will be ok  Ice 20 minutes 2 times daily. Usually after activity and before bed. Exercises 3 times a week.  Keep hands within peripheral vison  See me again in 3-4 weeks an dif still in pain we will consider injection of shoulder and or MRI

## 2020-02-24 NOTE — Progress Notes (Deleted)
[name] [address] [place] [address] Phone: [phone] Subjective:    I'm seeing this patient by the request  of:  Corwin Levins, MD  CC: Neck and shoulder pain follow-up  [id number]  Savannah [name] is a 64 y.o. female coming in with complaint of ***  Onset-  Location Duration-  Character- Aggravating factors- Reliving factors-  Therapies tried-  Severity-   Patient did have x-rays taken of the neck showing cervical spondylosis with foraminal narrowing at multiple levels.  Past Medical History:  Diagnosis Date  . Allergic rhinitis   . Asthma   . B12 deficiency   . Cancer ([facility]) 2016   kidney   . Depression   . Grade I internal hemorrhoids   . Hiatal hernia   . History of colon polyps   . History of kidney stones   . 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 Hypercholesterolemia   . Hypertension   . Hypothyroidism    endocrinologist -  dr Haynes Kerns  . Iron deficiency anemia   . Lumbar spondylosis   . NAFLD (nonalcoholic fatty liver disease)    dx 2010--- last hepatic panel in epic dated 04-17-2017  . Nocturia   . OA (osteoarthritis of spine)    Lumbar  . OSA on CPAP pulmologist-  dr sood   sleep study 09-11-2004  very severe osa , AHI 119/hr,  setting 10  . Peripheral neuropathy    bottom of feet  . Renal calculus, right   . Sleep apnea    uses CPAP  . Type 2 diabetes mellitus ([facility])    endocrinoloigst-  dr Everardo All,  last A1c 5.9 on 02-20-219 in epic  . Vitamin B 12 deficiency   . Wears glasses    Past Surgical History:  Procedure Laterality Date  . BUNIONECTOMY/  HAMMERTOE CORRECTION'S  09-07-2013   SC[facility]   RIGHT FOOT 2ND, 3RD, 4TH, 5TH DIGITS  . CARDIAC CATHETERIZATION  06-01-2009  dr Clifton James   no evidence CAD,  normal LVSF, elevated blood pressure w/  elevated end-diastolic pressure, ef 60-65%  . CARDIOVASCULAR STRESS TEST  05-11-2009   dr Clifton James   abnormal lexiscan nuclear study (no exercise) w/ mild ischemia in the distal anteroseptal wall and apex, this defect may be due to shifting breast attenuation  . COLONOSCOPY  last 02/08/2014  . COLONOSCOPY WITH SNARE POLYPECTOMY WITH ESOPHAGOGASTRODUODENOSCOPY  02/08/2014   with biopsy Dr. Judie Petit. Russella Dar  . CYSTOSCOPY WITH RETROGRADE PYELOGRAM, URETEROSCOPY AND STENT PLACEMENT Right 06/07/2017   Procedure: CYSTOSCOPY WITH RETROGRADE PYELOGRAM, URETEROSCOPY, LITHOTRIPSY,  AND STENT PLACEMENT 1st STAGE;  Surgeon: Sebastian Ache, MD;  Location: [facility];  Service: Urology;  Laterality: Right;  . CYSTOSCOPY WITH RETROGRADE PYELOGRAM, URETEROSCOPY AND STENT PLACEMENT Right 06/21/2017   Procedure: CYSTOSCOPY WITH RETROGRADE PYELOGRAM, URETEROSCOPY AND STENT PLACEMENT 2ND STAGE  STENT" EXCHANGE";  Surgeon: Sebastian Ache, MD;  Location: [facility];  Service: Urology;  Laterality: Right;  . FRACTURE SURGERY Right    Compound fracture foot  . HOLMIUM LASER APPLICATION Right 06/07/2017   Procedure: HOLMIUM LASER APPLICATION;  Surgeon: Sebastian Ache, MD;  Location: [facility];  Service: Urology;  Laterality: Right;  . HOLMIUM LASER APPLICATION Right 06/21/2017   Procedure: HOLMIUM LASER APPLICATION;  Surgeon: Sebastian Ache, MD;  [facility];  Service: Urology;  Laterality: Right;  . KNEE ARTHROSCOPY Left 1993  . POLYPECTOMY    . ROBOTIC ASSITED PARTIAL NEPHRECTOMY Right 05/19/2014   Procedure: ROBOTIC ASSITED PARTIAL NEPHRECTOMY;  Surgeon: Alexis Frock, MD;  Location: [facility];  Service: Urology;  Laterality: Right;   Social History   Socioeconomic History  . Marital status: Married    Spouse name: Not on file  . Number of children: 2  . Years of education: Not on file  . Highest education level: Not on file  Occupational History   . Occupation: Scientist, research (physical sciences): UNEMPLOYED  Tobacco Use  . Smoking status: Former Smoker    Packs/day: 0.30    Years: 2.00    Pack years: 0.60    Types: Cigarettes    Quit date: 02/27/1975    Years since quitting: 45.0  . Smokeless tobacco: Never Used  Vaping Use  . Vaping Use: Never used  Substance and Sexual Activity  . Alcohol use: No  . Drug use: No  . Sexual activity: Not on file  Other Topics Concern  . 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 Erythromycin Nausea And Vomiting   Family History  Problem Relation Age of Onset  . Colon cancer Mother 6  . Diabetes Mother   . Kidney disease Mother   . Colon polyps Mother   . Heart attack Father   . Heart disease Father   . Allergies Sister   . Asthma Sister   . Clotting disorder Grandchild   . Heart disease Paternal Grandmother   . Esophageal cancer Neg Hx   . Rectal cancer Neg Hx   . Stomach cancer Neg Hx     Current Outpatient Medications (Endocrine & Metabolic):  Marland Kitchen  FARXIGA 5 MG TABS tablet, Take 5 mg by mouth daily. Marland Kitchen  levothyroxine (SYNTHROID) 137 MCG tablet, TAKE 1 TABLET (137 MCG TOTAL) BY MOUTH DAILY BEFORE BREAKFAST. .  metFORMIN (GLUCOPHAGE-XR) 500 MG 24 hr tablet, TAKE 2 TABLETS BY MOUTH TWICE A DAY .  predniSONE (DELTASONE) 20 MG tablet, Take 1 tablet (20 mg total) by mouth daily with breakfast.  Current Outpatient Medications (Cardiovascular):  .  atorvastatin (LIPITOR) 40 MG tablet, TAKE 1 TABLET BY MOUTH EVERY DAY .  losartan (COZAAR) 100 MG tablet, Take 100 mg by mouth daily. .  metoprolol succinate (TOPROL-XL) 25 MG 24 hr tablet, TAKE 1/2 TABLET BY MOUTH EVERY DAY  Current Outpatient  Medications (Respiratory):  Marland Kitchen  ADVAIR DISKUS 100-50 MCG/DOSE AEPB, TAKE 1 PUFF BY MOUTH TWICE A DAY .  albuterol (PROAIR HFA) 108 (90 Base) MCG/ACT inhaler, Inhale 2 puffs into the lungs every 4 (four) hours as needed for wheezing or shortness of breath (cough).  Current Outpatient Medications (Analgesics):  Marland Kitchen  Oxycodone HCl 10 MG TABS, Take 10 mg by mouth daily as needed (pain).    Current Outpatient Medications (Other):  Marland Kitchen  ALPRAZolam (XANAX) 0.5 MG tablet, Take 1 tablet (0.5 mg total) by mouth at bedtime as needed for anxiety. .  cholecalciferol (VITAMIN D) 1000 units tablet, Take 1,000 Units by mouth 2 (two) times daily. .  citalopram (  CELEXA) 20 MG tablet, TAKE 1 TABLET BY MOUTH EVERY DAY IN THE MORNING .  gabapentin (NEURONTIN) 100 MG capsule, Take 1 capsule (100 mg total) by mouth 2 (two) times daily. Marland Kitchen  gabapentin (NEURONTIN) 300 MG capsule, TAKE 1 CAPSULE BY MOUTH EVERYDAY AT BEDTIME .  hyoscyamine (LEVSIN SL) 0.125 MG SL tablet, Take 1-2 tablets by mouth or under tongue every 4 hours as needed. (Patient taking differently: Take 0.125-0.25 mg by mouth every 4 (four) hours as needed for cramping (stomach spasms). ) .  ondansetron (ZOFRAN-ODT) 4 MG disintegrating tablet, Take 1 tablet (4 mg total) by mouth every 8 (eight) hours as needed for nausea or vomiting. .  traZODone (DESYREL) 100 MG tablet, TAKE 1 TABLET BY MOUTH EVERYDAY AT BEDTIME .  XIIDRA 5 % SOLN, Place 1 drop into both eyes 2 (two) times daily.   Reviewed prior external information including notes and imaging from  primary care provider As well as notes that were available from care everywhere and other healthcare systems.  Past medical history, social, surgical and family history all reviewed in electronic medical record.  No pertanent information unless stated regarding to the chief complaint.   Review of Systems:  No headache, visual changes, nausea, vomiting, diarrhea, constipation, dizziness, abdominal pain, skin  rash, fevers, chills, night sweats, weight loss, swollen lymph nodes, body aches, joint swelling, chest pain, shortness of breath, mood changes. POSITIVE muscle aches  Objective  There were no vitals taken for this visit.   General: No apparent distress alert and oriented x3 mood and affect normal, dressed appropriately.  HEENT: Pupils equal, extraocular movements intact  Respiratory: Patient's speak in full sentences and does not appear short of breath  Cardiovascular: No lower extremity edema, non tender, no erythema  Neuro: Cranial nerves II through XII are intact, neurovascularly intact in all extremities with 2+ DTRs and 2+ pulses.  Gait normal with good balance and coordination.  MSK:  Non tender with full range of motion and good stability and symmetric strength and tone of shoulders, elbows, wrist, hip, knee and ankles bilaterally.     Impression and Recommendations:     The above documentation has been reviewed and is accurate and complete Lyndal Pulley, DO

## 2020-02-25 ENCOUNTER — Ambulatory Visit: Payer: BC Managed Care – PPO | Admitting: Family Medicine

## 2020-03-31 ENCOUNTER — Other Ambulatory Visit: Payer: Self-pay | Admitting: Internal Medicine

## 2020-04-19 ENCOUNTER — Other Ambulatory Visit: Payer: Self-pay

## 2020-04-20 ENCOUNTER — Ambulatory Visit (INDEPENDENT_AMBULATORY_CARE_PROVIDER_SITE_OTHER): Payer: BC Managed Care – PPO | Admitting: Internal Medicine

## 2020-04-20 ENCOUNTER — Encounter: Payer: Self-pay | Admitting: Internal Medicine

## 2020-04-20 VITALS — BP 130/80 | HR 95 | Temp 98.3°F | Ht 68.0 in | Wt 226.0 lb

## 2020-04-20 DIAGNOSIS — N189 Chronic kidney disease, unspecified: Secondary | ICD-10-CM

## 2020-04-20 DIAGNOSIS — E559 Vitamin D deficiency, unspecified: Secondary | ICD-10-CM

## 2020-04-20 DIAGNOSIS — I10 Essential (primary) hypertension: Secondary | ICD-10-CM | POA: Diagnosis not present

## 2020-04-20 DIAGNOSIS — E7800 Pure hypercholesterolemia, unspecified: Secondary | ICD-10-CM

## 2020-04-20 DIAGNOSIS — N1831 Chronic kidney disease, stage 3a: Secondary | ICD-10-CM | POA: Diagnosis not present

## 2020-04-20 DIAGNOSIS — E119 Type 2 diabetes mellitus without complications: Secondary | ICD-10-CM | POA: Diagnosis not present

## 2020-04-20 HISTORY — DX: Chronic kidney disease, unspecified: N18.9

## 2020-04-20 LAB — POCT GLYCOSYLATED HEMOGLOBIN (HGB A1C): Hemoglobin A1C: 6.7 % — AB (ref 4.0–5.6)

## 2020-04-20 NOTE — Progress Notes (Signed)
Patient ID: Savannah [name], female   DOB: 09/09/1955, 65 y.o.   MRN: [id number]         Chief Complaint:: htn, hld, dm, vit d, ckd       HPI:  Savannah [name] is a 65 y.o. female here and plans to see optho soon and pap and mamogram on her own.   Also has f/u with Dr Nelva Bush on thur after recent [facility] to the right paracervical with good improvement of pain to RUE, but still hurts to drive somewhat.  Pt denies chest pain, increased sob or doe, wheezing, orthopnea, PND, increased LE swelling, palpitations, dizziness or syncope.   Pt denies polydipsia, polyuria, Denies focal neuro s/s new onset.   Pt denies fever, wt loss, night sweats, loss of appetite, or other constitutional symptoms  Taking vit d well   Wt Readings from Last 3 Encounters:  04/20/20 226 lb (102.5 kg)  02/03/20 230 lb (104.3 kg)  10/16/19 228 lb (103.4 kg)   BP Readings from Last 3 Encounters:  04/20/20 130/80  02/03/20 130/90  10/16/19 140/68   Immunization History  Administered Date(s) Administered  . Fluad Quad(high Dose 65+) 12/09/2019  . Influenza Inj Mdck Quad Pf 12/20/2016  . Influenza Split 12/29/2010, 12/28/2011  . Influenza Whole 12/16/2007, 12/06/2008, 12/26/2009  . Influenza,inj,Quad PF,6+ Mos 04/15/2015, 01/06/2016, 11/07/2017, 02/04/2019  . Influenza-Unspecified 11/26/2013, 12/27/2016  . PFIZER(Purple Top)SARS-COV-2 Vaccination 05/22/2019, 06/15/2019, 01/01/2020  . Pneumococcal Conjugate-13 10/16/2019  . Pneumococcal Polysaccharide-23 05/09/2010, 04/17/2017  . Td 11/16/2004  . Tdap 04/17/2016  . Zoster 04/15/2015   Health Maintenance Due  Topic Date Due  . PAP SMEAR-Modifier  09/27/2019      Past Medical History:  Diagnosis Date  . Allergic rhinitis   . Asthma   . B12 deficiency   . Cancer ([place]) 2016   kidney   . Depression   . Grade I internal hemorrhoids   . Hiatal hernia   . History of colon polyps   . History of kidney stones   . 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 Hypercholesterolemia   . Hypertension   . Hypothyroidism    endocrinologist -  dr Hale Bogus  . Iron deficiency anemia   . Lumbar spondylosis   . NAFLD (nonalcoholic fatty liver disease)    dx 2010--- last hepatic panel in epic dated 04-17-2017  . Nocturia   . OA (osteoarthritis of spine)    Lumbar  . OSA on CPAP pulmologist-  dr sood   sleep study 09-11-2004  very severe osa , AHI 119/hr,  setting 10  . Peripheral neuropathy    bottom of feet  . Renal calculus, right   . Sleep apnea    uses CPAP  . Type 2 diabetes mellitus ([place])    endocrinoloigst-  dr Loanne Drilling,  last A1c 5.9 on 02-20-219 in epic  . Vitamin B 12 deficiency   . Wears glasses    Past Surgical History:  Procedure Laterality Date  . BUNIONECTOMY/  HAMMERTOE CORRECTION'S  09-07-2013   SCG   RIGHT FOOT 2ND, 3RD, 4TH, 5TH DIGITS  . CARDIAC CATHETERIZATION  06-01-2009  dr Angelena Form   no evidence CAD,  normal LVSF, elevated blood pressure w/ elevated end-diastolic pressure, ef 33-82%  . CARDIOVASCULAR STRESS TEST  05-11-2009   dr Angelena Form   abnormal lexiscan nuclear study (no  exercise) w/ mild ischemia in the distal anteroseptal wall and apex, this defect may be due to shifting breast attenuation  . COLONOSCOPY  last 02/08/2014  . COLONOSCOPY WITH SNARE POLYPECTOMY WITH ESOPHAGOGASTRODUODENOSCOPY  02/08/2014   with biopsy Dr. Jerilynn Mages. [facility] Plan  . CYSTOSCOPY WITH RETROGRADE PYELOGRAM, URETEROSCOPY AND STENT PLACEMENT Right 06/07/2017   Procedure: CYSTOSCOPY WITH RETROGRADE PYELOGRAM, URETEROSCOPY, LITHOTRIPSY,  AND STENT PLACEMENT 1st STAGE;  Surgeon: Alexis Frock, MD;  Location: [facility];  Service: Urology;  Laterality: Right;  . CYSTOSCOPY WITH RETROGRADE PYELOGRAM, URETEROSCOPY AND STENT PLACEMENT Right 06/21/2017   Procedure: CYSTOSCOPY WITH RETROGRADE PYELOGRAM, URETEROSCOPY  AND STENT PLACEMENT 2ND STAGE  STENT" EXCHANGE";  Surgeon: Alexis Frock, MD;  Location: [facility];  Service: Urology;  Laterality: Right;  . FRACTURE SURGERY Right    Compound fracture foot  . HOLMIUM LASER APPLICATION Right 0/73/7106   Procedure: HOLMIUM LASER APPLICATION;  Surgeon: Alexis Frock, MD;  Location: [facility];  Service: Urology;  Laterality: Right;  . HOLMIUM LASER APPLICATION Right 2/69/4854   Procedure: HOLMIUM LASER APPLICATION;  Surgeon: Alexis Frock, MD;  Location: [facility];  Service: Urology;  Laterality: Right;  . KNEE ARTHROSCOPY Left 1993  . POLYPECTOMY    . ROBOTIC ASSITED PARTIAL NEPHRECTOMY Right 05/19/2014   Procedure: ROBOTIC ASSITED PARTIAL NEPHRECTOMY;  Surgeon: Alexis Frock, MD;  Location: [facility];  Service: Urology;  Laterality: Right;    reports that she quit smoking about 45 years ago. Her smoking use included cigarettes. She has a 0.60 pack-year smoking history. She has never used smokeless tobacco. She reports that she does not drink alcohol and does not use drugs. family history includes Allergies in her sister; Asthma in her sister; Clotting disorder in her grandchild; Colon cancer (age of onset: 27) in her mother; Colon polyps in her mother; Diabetes in her mother; Heart attack in her father; Heart disease in her father and paternal grandmother; Kidney disease in her mother. Allergies  Allergen Reactions  . Erythromycin Nausea And Vomiting   Current Outpatient Medications on File Prior to Visit  Medication Sig Dispense Refill  . ADVAIR DISKUS 100-50 MCG/DOSE AEPB TAKE 1 PUFF BY MOUTH TWICE A DAY 180 each 3  . albuterol (PROAIR HFA) 108 (90 Base) MCG/ACT inhaler Inhale 2 puffs into the lungs every 4 (four) hours as needed for wheezing or shortness of breath (cough). 3 Inhaler 3  . atorvastatin (LIPITOR) 40 MG tablet TAKE 1 TABLET BY MOUTH EVERY DAY 90 tablet 2  . cholecalciferol (VITAMIN D) 1000  units tablet Take 1,000 Units by mouth 2 (two) times daily.    . citalopram (CELEXA) 20 MG tablet TAKE 1 TABLET BY MOUTH EVERY DAY IN THE MORNING 90 tablet 2  . FARXIGA 5 MG TABS tablet Take 5 mg by mouth daily.    Marland Kitchen gabapentin (NEURONTIN) 300 MG capsule TAKE 1 CAPSULE BY MOUTH EVERYDAY AT BEDTIME 90 capsule 1  . hyoscyamine (LEVSIN SL) 0.125 MG SL tablet Take 1-2 tablets by mouth or under tongue every 4 hours as needed. (Patient taking differently: Take 0.125-0.25 mg by mouth every 4 (four) hours as needed for cramping (stomach spasms).) 100 tablet 5  . levothyroxine (SYNTHROID) 137 MCG tablet TAKE 1 TABLET (137 MCG TOTAL) BY MOUTH DAILY BEFORE BREAKFAST. 90 tablet 2  . losartan (COZAAR) 100 MG tablet Take 100 mg by mouth daily.    . metFORMIN (GLUCOPHAGE-XR) 500 MG 24 hr tablet TAKE 2 TABLETS BY MOUTH TWICE A  DAY 360 tablet 2  . metoprolol succinate (TOPROL-XL) 25 MG 24 hr tablet TAKE 1/2 TABLET BY MOUTH EVERY DAY 45 tablet 3  . ondansetron (ZOFRAN-ODT) 4 MG disintegrating tablet Take 1 tablet (4 mg total) by mouth every 8 (eight) hours as needed for nausea or vomiting. 20 tablet 0  . Oxycodone HCl 10 MG TABS Take 10 mg by mouth daily as needed (pain).   0  . traZODone (DESYREL) 100 MG tablet TAKE 1 TABLET BY MOUTH EVERYDAY AT BEDTIME 90 tablet 1  . XIIDRA 5 % SOLN Place 1 drop into both eyes 2 (two) times daily.  3  . ALPRAZolam (XANAX) 0.5 MG tablet Take 1 tablet (0.5 mg total) by mouth at bedtime as needed for anxiety. (Patient not taking: Reported on 04/20/2020) 60 tablet 1  . collagenase (SANTYL) ointment Santyl 250 unit/gram topical ointment    . gabapentin (NEURONTIN) 100 MG capsule Take 1 capsule (100 mg total) by mouth 2 (two) times daily. (Patient not taking: Reported on 04/20/2020) 60 capsule 3  . predniSONE (DELTASONE) 20 MG tablet Take 1 tablet (20 mg total) by mouth daily with breakfast. (Patient not taking: Reported on 04/20/2020) 7 tablet 0   No current facility-administered  medications on file prior to visit.        ROS:  All others reviewed and negative.  Objective        PE:  BP 130/80   Pulse 95   Temp 98.3 F (36.8 C) (Oral)   Ht 5\' 8"  (1.727 m)   Wt 226 lb (102.5 kg)   SpO2 96%   BMI 34.36 kg/m                 Constitutional: Pt appears in NAD               HENT: Head: NCAT.                Right Ear: External ear normal.                 Left Ear: External ear normal.                Eyes: . Pupils are equal, round, and reactive to light. Conjunctivae and EOM are normal               Nose: without d/c or deformity               Neck: Neck supple. Gross normal ROM               Cardiovascular: Normal rate and regular rhythm.                 Pulmonary/Chest: Effort normal and breath sounds without rales or wheezing.                Abd:  Soft, NT, ND, + BS, no organomegaly               Neurological: Pt is alert. At baseline orientation, motor grossly intact               Skin: Skin is warm. No rashes, no other new lesions, LE edema -none               Psychiatric: Pt behavior is normal without agitation   Micro: none  Cardiac tracings I have personally interpreted today:  none  Pertinent Radiological findings (summarize): none     Lab Results  Component Value Date  WBC 6.4 10/16/2019   HGB 11.9 10/16/2019   HCT 35.5 10/16/2019   PLT 170 10/16/2019   GLUCOSE 124 (H) 10/16/2019   CHOL 125 10/16/2019   TRIG 145 10/16/2019   HDL 49 (L) 10/16/2019   LDLDIRECT 88.0 01/14/2015   LDLCALC 53 10/16/2019   ALT 14 10/16/2019   AST 15 10/16/2019   NA 140 10/16/2019   K 3.8 10/16/2019   CL 107 10/16/2019   CREATININE 1.08 (H) 10/16/2019   BUN 15 10/16/2019   CO2 25 10/16/2019   TSH 0.42 10/16/2019   INR 1.1 ratio (H) 05/23/2009   HGBA1C 6.7 (A) 04/20/2020   MICROALBUR 1.1 10/16/2019   Contains abnormal dataPOCT glycosylated hemoglobin (Hb A1C) Order: [id number]  Status: Final result   Visible to patient: No (scheduled for 04/20/2020  1:00 PM)   Dx: Type 2 diabetes mellitus without comp...   0 Result Notes   1 HM Topic  Component Ref Range & Units 12:00  (04/20/20) 6 mo ago  (10/16/19) 1 yr ago  (04/21/19) 1 yr ago  (09/22/18) 2 yr ago  (10/15/17) 3 yr ago  (02/28/17) 3 yr ago  (10/15/16)  Hemoglobin A1C 4.0 - 5.6 % 6.7Abnormal  6.0High R, CM  6.0 R, CM  5.8 R, CM  5.6  5.9 R  5.6 R         Assessment/Plan:  Savannah [name] is a 65 y.o. White or Caucasian [1] female with  has a past medical history of Allergic rhinitis, Asthma, B12 deficiency, Cancer ([place]) (2016), Depression, Grade I internal hemorrhoids, Hiatal hernia, History of colon polyps, History of kidney stones, History of renal cell carcinoma (urologist-  dr Tresa Moore), Hypercholesterolemia, Hypertension, Hypothyroidism, Iron deficiency anemia, Lumbar spondylosis, NAFLD (nonalcoholic fatty liver disease), Nocturia, OA (osteoarthritis of spine), OSA on CPAP (pulmologist-  dr Halford Chessman), Peripheral neuropathy, Renal calculus, right, Sleep apnea, Type 2 diabetes mellitus ([place]), Vitamin B 12 deficiency, and Wears glasses.  Diabetes [facility]) Lab Results  Component Value Date   HGBA1C 6.7 (A) 04/20/2020   Stable, pt to continue current medical treatment farxiga, metformin  Current Outpatient Medications (Endocrine & Metabolic):  Marland Kitchen  FARXIGA 5 MG TABS tablet, Take 5 mg by mouth daily. Marland Kitchen  levothyroxine (SYNTHROID) 137 MCG tablet, TAKE 1 TABLET (137 MCG TOTAL) BY MOUTH DAILY BEFORE BREAKFAST. .  metFORMIN (GLUCOPHAGE-XR) 500 MG 24 hr tablet, TAKE 2 TABLETS BY MOUTH TWICE A DAY .  predniSONE (DELTASONE) 20 MG tablet, Take 1 tablet (20 mg total) by mouth daily with breakfast. (Patient not taking: Reported on 04/20/2020)  Current Outpatient Medications (Cardiovascular):  .  atorvastatin (LIPITOR) 40 MG tablet, TAKE 1 TABLET BY MOUTH EVERY DAY .  losartan (COZAAR) 100 MG tablet, Take 100 mg by mouth daily. .  metoprolol succinate (TOPROL-XL) 25 MG 24 hr tablet, TAKE 1/2  TABLET BY MOUTH EVERY DAY  Current Outpatient Medications (Respiratory):  Marland Kitchen  ADVAIR DISKUS 100-50 MCG/DOSE AEPB, TAKE 1 PUFF BY MOUTH TWICE A DAY .  albuterol (PROAIR HFA) 108 (90 Base) MCG/ACT inhaler, Inhale 2 puffs into the lungs every 4 (four) hours as needed for wheezing or shortness of breath (cough).  Current Outpatient Medications (Analgesics):  Marland Kitchen  Oxycodone HCl 10 MG TABS, Take 10 mg by mouth daily as needed (pain).    Current Outpatient Medications (Other):  .  cholecalciferol (VITAMIN D) 1000 units tablet, Take 1,000 Units by mouth 2 (two) times daily. .  citalopram (CELEXA) 20 MG tablet, TAKE 1 TABLET BY MOUTH EVERY  DAY IN THE MORNING .  gabapentin (NEURONTIN) 300 MG capsule, TAKE 1 CAPSULE BY MOUTH EVERYDAY AT BEDTIME .  hyoscyamine (LEVSIN SL) 0.125 MG SL tablet, Take 1-2 tablets by mouth or under tongue every 4 hours as needed. (Patient taking differently: Take 0.125-0.25 mg by mouth every 4 (four) hours as needed for cramping (stomach spasms).) .  ondansetron (ZOFRAN-ODT) 4 MG disintegrating tablet, Take 1 tablet (4 mg total) by mouth every 8 (eight) hours as needed for nausea or vomiting. .  traZODone (DESYREL) 100 MG tablet, TAKE 1 TABLET BY MOUTH EVERYDAY AT BEDTIME .  XIIDRA 5 % SOLN, Place 1 drop into both eyes 2 (two) times daily. Marland Kitchen  ALPRAZolam (XANAX) 0.5 MG tablet, Take 1 tablet (0.5 mg total) by mouth at bedtime as needed for anxiety. (Patient not taking: Reported on 04/20/2020) .  collagenase (SANTYL) ointment, Santyl 250 unit/gram topical ointment .  gabapentin (NEURONTIN) 100 MG capsule, Take 1 capsule (100 mg total) by mouth 2 (two) times daily. (Patient not taking: Reported on 04/20/2020)   Essential hypertension BP Readings from Last 3 Encounters:  04/20/20 130/80  02/03/20 130/90  10/16/19 140/68   Stable, pt to continue medical treatment lostart, toprol   HYPERCHOLESTEROLEMIA Lab Results  Component Value Date   LDLCALC 53 10/16/2019   Stable, pt to  continue current statin lipitor 40   Stage 3 chronic kidney disease Lab Results  Component Value Date   CREATININE 1.08 (H) 10/16/2019   Stable overall, cont to avoid nephrotoxins'  Vitamin D deficiency Last vitamin D Lab Results  Component Value Date   VD25OH 49 10/16/2019   Stable, cont oral replacement  Followup: Return in about 6 months (around 10/18/2020).  Cathlean Cower, MD 04/24/2020 9:16 PM [facility]

## 2020-04-20 NOTE — Patient Instructions (Signed)
Your A1c was ok today  Please continue all other medications as before, and refills have been done if requested.  Please have the pharmacy call with any other refills you may need.  Please continue your efforts at being more active, low cholesterol diet, and weight control  Please keep your appointments with your specialists as you may have planned  Please make an Appointment to return in 6 months, or sooner if needed

## 2020-04-24 ENCOUNTER — Encounter: Payer: Self-pay | Admitting: Internal Medicine

## 2020-04-24 NOTE — Assessment & Plan Note (Signed)
BP Readings from Last 3 Encounters:  04/20/20 130/80  02/03/20 130/90  10/16/19 140/68   Stable, pt to continue medical treatment lostart, toprol

## 2020-04-24 NOTE — Assessment & Plan Note (Signed)
Lab Results  Component Value Date   HGBA1C 6.7 (A) 04/20/2020   Stable, pt to continue current medical treatment farxiga, metformin  Current Outpatient Medications (Endocrine & Metabolic):  Marland Kitchen  FARXIGA 5 MG TABS tablet, Take 5 mg by mouth daily. Marland Kitchen  levothyroxine (SYNTHROID) 137 MCG tablet, TAKE 1 TABLET (137 MCG TOTAL) BY MOUTH DAILY BEFORE BREAKFAST. .  metFORMIN (GLUCOPHAGE-XR) 500 MG 24 hr tablet, TAKE 2 TABLETS BY MOUTH TWICE A DAY .  predniSONE (DELTASONE) 20 MG tablet, Take 1 tablet (20 mg total) by mouth daily with breakfast. (Patient not taking: Reported on 04/20/2020)  Current Outpatient Medications (Cardiovascular):  .  atorvastatin (LIPITOR) 40 MG tablet, TAKE 1 TABLET BY MOUTH EVERY DAY .  losartan (COZAAR) 100 MG tablet, Take 100 mg by mouth daily. .  metoprolol succinate (TOPROL-XL) 25 MG 24 hr tablet, TAKE 1/2 TABLET BY MOUTH EVERY DAY  Current Outpatient Medications (Respiratory):  Marland Kitchen  ADVAIR DISKUS 100-50 MCG/DOSE AEPB, TAKE 1 PUFF BY MOUTH TWICE A DAY .  albuterol (PROAIR HFA) 108 (90 Base) MCG/ACT inhaler, Inhale 2 puffs into the lungs every 4 (four) hours as needed for wheezing or shortness of breath (cough).  Current Outpatient Medications (Analgesics):  Marland Kitchen  Oxycodone HCl 10 MG TABS, Take 10 mg by mouth daily as needed (pain).    Current Outpatient Medications (Other):  .  cholecalciferol (VITAMIN D) 1000 units tablet, Take 1,000 Units by mouth 2 (two) times daily. .  citalopram (CELEXA) 20 MG tablet, TAKE 1 TABLET BY MOUTH EVERY DAY IN THE MORNING .  gabapentin (NEURONTIN) 300 MG capsule, TAKE 1 CAPSULE BY MOUTH EVERYDAY AT BEDTIME .  hyoscyamine (LEVSIN SL) 0.125 MG SL tablet, Take 1-2 tablets by mouth or under tongue every 4 hours as needed. (Patient taking differently: Take 0.125-0.25 mg by mouth every 4 (four) hours as needed for cramping (stomach spasms).) .  ondansetron (ZOFRAN-ODT) 4 MG disintegrating tablet, Take 1 tablet (4 mg total) by mouth every 8 (eight)  hours as needed for nausea or vomiting. .  traZODone (DESYREL) 100 MG tablet, TAKE 1 TABLET BY MOUTH EVERYDAY AT BEDTIME .  XIIDRA 5 % SOLN, Place 1 drop into both eyes 2 (two) times daily. Marland Kitchen  ALPRAZolam (XANAX) 0.5 MG tablet, Take 1 tablet (0.5 mg total) by mouth at bedtime as needed for anxiety. (Patient not taking: Reported on 04/20/2020) .  collagenase (SANTYL) ointment, Santyl 250 unit/gram topical ointment .  gabapentin (NEURONTIN) 100 MG capsule, Take 1 capsule (100 mg total) by mouth 2 (two) times daily. (Patient not taking: Reported on 04/20/2020)

## 2020-04-24 NOTE — Assessment & Plan Note (Signed)
Last vitamin D Lab Results  Component Value Date   VD25OH 49 10/16/2019   Stable, cont oral replacement

## 2020-04-24 NOTE — Assessment & Plan Note (Signed)
Lab Results  Component Value Date   LDLCALC 53 10/16/2019   Stable, pt to continue current statin lipitor 40

## 2020-04-24 NOTE — Assessment & Plan Note (Signed)
Lab Results  Component Value Date   CREATININE 1.08 (H) 10/16/2019   Stable overall, cont to avoid nephrotoxins'

## 2020-07-10 ENCOUNTER — Other Ambulatory Visit: Payer: Self-pay | Admitting: Internal Medicine

## 2020-08-02 ENCOUNTER — Other Ambulatory Visit: Payer: Self-pay

## 2020-08-02 ENCOUNTER — Ambulatory Visit (INDEPENDENT_AMBULATORY_CARE_PROVIDER_SITE_OTHER): Payer: BC Managed Care – PPO | Admitting: Internal Medicine

## 2020-08-02 ENCOUNTER — Encounter: Payer: Self-pay | Admitting: Internal Medicine

## 2020-08-02 VITALS — BP 142/80 | HR 68 | Temp 100.1°F | Ht 68.0 in | Wt 223.0 lb

## 2020-08-02 DIAGNOSIS — E039 Hypothyroidism, unspecified: Secondary | ICD-10-CM | POA: Diagnosis not present

## 2020-08-02 DIAGNOSIS — R1030 Lower abdominal pain, unspecified: Secondary | ICD-10-CM | POA: Diagnosis not present

## 2020-08-02 DIAGNOSIS — R109 Unspecified abdominal pain: Secondary | ICD-10-CM | POA: Insufficient documentation

## 2020-08-02 DIAGNOSIS — E1165 Type 2 diabetes mellitus with hyperglycemia: Secondary | ICD-10-CM

## 2020-08-02 DIAGNOSIS — I10 Essential (primary) hypertension: Secondary | ICD-10-CM

## 2020-08-02 MED ORDER — LEVOFLOXACIN 500 MG PO TABS: 500.0000 mg | ORAL_TABLET | Freq: Every day | ORAL | 0 refills | Status: DC

## 2020-08-02 MED ORDER — CEFTRIAXONE SODIUM 1 G IJ SOLR
1.0000 g | Freq: Once | INTRAMUSCULAR | Status: AC
  Administered 2020-08-02: 1 g via INTRAMUSCULAR

## 2020-08-02 NOTE — Assessment & Plan Note (Signed)
Presentation atypical but suspect UTI - for rocephin IM 1 gm, levaquin course after urine cx and cbc, bmp and labs as ordered

## 2020-08-02 NOTE — Patient Instructions (Signed)
You had the antibiotic shot today (rocephin)  Please take all new medication as prescribed - the pill antibiotic   Please drink more fluids  Please continue all other medications as before, and refills have been done if requested.  Please have the pharmacy call with any other refills you may need.  Please keep your appointments with your specialists as you may have planned  Please go to the LAB at the blood drawing area for the tests to be done  You will be contacted by phone if any changes need to be made immediately.  Otherwise, you will receive a letter about your results with an explanation, but please check with MyChart first.  Please remember to sign up for MyChart if you have not done so, as this will be important to you in the future with finding out test results, communicating by private email, and scheduling acute appointments online when needed.

## 2020-08-02 NOTE — Assessment & Plan Note (Signed)
Lab Results  Component Value Date   HGBA1C 6.7 (A) 04/20/2020   Stable, pt to continue current medical treatment farxiga, metformin

## 2020-08-02 NOTE — Assessment & Plan Note (Signed)
BP Readings from Last 3 Encounters:  08/02/20 (!) 142/80  04/20/20 130/80  02/03/20 130/90   Mild uncontrolled, likely reactive,  pt to continue medical treatment losartan 100

## 2020-08-02 NOTE — Assessment & Plan Note (Signed)
Lab Results  Component Value Date   TSH 0.42 10/16/2019   Stable, pt to continue levothyroxine

## 2020-08-02 NOTE — Progress Notes (Signed)
Patient ID: Savannah [name], female   DOB: [birth year], 65 y.o.   MRN: [id number]        Chief Complaint: acute illness with abd pain and dizzy       HPI:  Savannah [name] is a 65 y.o. female here with above x 3 days, feeing poorly, feverish, nausea, dizzy, weakness, low abd pain mild to mod (but severe yesterday - for some reason better today) but Denies urinary symptoms such as dysuria, frequency, urgency, flank pain, hematuria or n/v, fever, chills.  Denies worsening reflux, dysphagia, vomiting, bowel change or blood.  Has hx of UTIs similar in past. No hx of diverticulitis.  Pt denies chest pain, increased sob or doe, wheezing, orthopnea, PND, increased LE swelling, palpitations, dizziness or syncope.  No cough or ST. Denies hyper or hypo thyroid symptoms such as voice, skin or hair change.  Pt asks for thyroid labs with blood draw       Wt Readings from Last 3 Encounters:  08/02/20 223 lb (101.2 kg)  04/20/20 226 lb (102.5 kg)  02/03/20 230 lb (104.3 kg)   BP Readings from Last 3 Encounters:  08/02/20 (!) 142/80  04/20/20 130/80  02/03/20 130/90         Past Medical History:  Diagnosis Date  . Allergic rhinitis   . Asthma   . B12 deficiency   . Cancer ([place]) 2016   kidney   . Depression   . Grade I internal hemorrhoids   . Hiatal hernia   . History of colon polyps   . History of kidney stones   . 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 Hypercholesterolemia   . Hypertension   . Hypothyroidism    endocrinologist -  dr Hale Bogus  . Iron deficiency anemia   . Lumbar spondylosis   . NAFLD (nonalcoholic fatty liver disease)    dx 2010--- last hepatic panel in epic dated 04-17-2017  . Nocturia   . OA (osteoarthritis of spine)    Lumbar  . OSA on CPAP pulmologist-  dr sood   sleep study 09-11-2004  very severe osa , AHI 119/hr,   setting 10  . Peripheral neuropathy    bottom of feet  . Renal calculus, right   . Sleep apnea    uses CPAP  . Type 2 diabetes mellitus ([place])    endocrinoloigst-  dr Loanne Drilling,  last A1c 5.9 on 02-20-219 in epic  . Vitamin B 12 deficiency   . Wears glasses    Past Surgical History:  Procedure Laterality Date  . BUNIONECTOMY/  HAMMERTOE CORRECTION'S  09-07-2013   [facility]   RIGHT FOOT 2ND, 3RD, 4TH, 5TH DIGITS  . CARDIAC CATHETERIZATION  06-01-2009  dr Angelena Form   no evidence CAD,  normal LVSF, elevated blood pressure w/ elevated end-diastolic pressure, ef 93-79%  . CARDIOVASCULAR STRESS TEST  05-11-2009   dr Angelena Form   abnormal lexiscan nuclear study (no exercise) w/ mild ischemia in the distal anteroseptal wall and apex, this defect may be due to shifting breast attenuation  . COLONOSCOPY  last 02/08/2014  . COLONOSCOPY WITH SNARE POLYPECTOMY WITH ESOPHAGOGASTRODUODENOSCOPY  02/08/2014   with biopsy Dr. Jerilynn Mages. [facility]  . CYSTOSCOPY WITH RETROGRADE PYELOGRAM, URETEROSCOPY AND STENT PLACEMENT Right 06/07/2017   Procedure: CYSTOSCOPY WITH RETROGRADE PYELOGRAM, URETEROSCOPY, LITHOTRIPSY,  AND STENT PLACEMENT 1st STAGE;  Surgeon: Tresa Moore,  Hubbard Robinson, MD;  Location: [facility];  Service: Urology;  Laterality: Right;  . CYSTOSCOPY WITH RETROGRADE PYELOGRAM, URETEROSCOPY AND STENT PLACEMENT Right 06/21/2017   Procedure: CYSTOSCOPY WITH RETROGRADE PYELOGRAM, URETEROSCOPY AND STENT PLACEMENT 2ND STAGE  STENT" EXCHANGE";  Surgeon: Alexis Frock, MD;  Location: [facility];  Service: Urology;  Laterality: Right;  . FRACTURE SURGERY Right    Compound fracture foot  . HOLMIUM LASER APPLICATION Right 0/62/6948   Procedure: HOLMIUM LASER APPLICATION;  Surgeon: Alexis Frock, MD;  Location: [facility];  Service: Urology;  Laterality: Right;  . HOLMIUM LASER APPLICATION Right 5/46/2703   Procedure: HOLMIUM LASER APPLICATION;  Surgeon: Alexis Frock, MD;  Location:  [facility];  Service: Urology;  Laterality: Right;  . KNEE ARTHROSCOPY Left 1993  . POLYPECTOMY    . ROBOTIC ASSITED PARTIAL NEPHRECTOMY Right 05/19/2014   Procedure: ROBOTIC ASSITED PARTIAL NEPHRECTOMY;  Surgeon: Alexis Frock, MD;  Location: [facility];  Service: Urology;  Laterality: Right;    reports that she quit smoking about 45 years ago. Her smoking use included cigarettes. She has a 0.60 pack-year smoking history. She has never used smokeless tobacco. She reports that she does not drink alcohol and does not use drugs. family history includes Allergies in her sister; Asthma in her sister; Clotting disorder in her grandchild; Colon cancer (age of onset: 33) in her mother; Colon polyps in her mother; Diabetes in her mother; Heart attack in her father; Heart disease in her father and paternal grandmother; Kidney disease in her mother. Allergies  Allergen Reactions  . Erythromycin Nausea And Vomiting   Current Outpatient Medications on File Prior to Visit  Medication Sig Dispense Refill  . ADVAIR DISKUS 100-50 MCG/DOSE AEPB TAKE 1 PUFF BY MOUTH TWICE A DAY 180 each 3  . albuterol (PROAIR HFA) 108 (90 Base) MCG/ACT inhaler Inhale 2 puffs into the lungs every 4 (four) hours as needed for wheezing or shortness of breath (cough). 3 Inhaler 3  . atorvastatin (LIPITOR) 40 MG tablet TAKE 1 TABLET BY MOUTH EVERY DAY 90 tablet 2  . cholecalciferol (VITAMIN D) 1000 units tablet Take 1,000 Units by mouth 2 (two) times daily.    . citalopram (CELEXA) 20 MG tablet TAKE 1 TABLET BY MOUTH EVERY DAY IN THE MORNING 90 tablet 2  . collagenase (SANTYL) ointment Santyl 250 unit/gram topical ointment    . FARXIGA 5 MG TABS tablet Take 5 mg by mouth daily.    Marland Kitchen gabapentin (NEURONTIN) 300 MG capsule TAKE 1 CAPSULE BY MOUTH EVERYDAY AT BEDTIME 90 capsule 1  . levothyroxine (SYNTHROID) 137 MCG tablet TAKE 1 TABLET (137 MCG TOTAL) BY MOUTH DAILY BEFORE BREAKFAST. 90 tablet 2  . losartan (COZAAR) 100 MG  tablet Take 100 mg by mouth daily.    . metFORMIN (GLUCOPHAGE-XR) 500 MG 24 hr tablet TAKE 2 TABLETS BY MOUTH TWICE A DAY 360 tablet 2  . metoprolol succinate (TOPROL-XL) 25 MG 24 hr tablet TAKE 1/2 TABLET BY MOUTH EVERY DAY 45 tablet 3  . Oxycodone HCl 10 MG TABS Take 10 mg by mouth daily as needed (pain).   0  . traZODone (DESYREL) 100 MG tablet TAKE 1 TABLET BY MOUTH EVERYDAY AT BEDTIME 90 tablet 1  . ALPRAZolam (XANAX) 0.5 MG tablet Take 1 tablet (0.5 mg total) by mouth at bedtime as needed for anxiety. (Patient not taking: Reported on 08/02/2020) 60 tablet 1  . gabapentin (NEURONTIN) 100 MG capsule Take 1 capsule (100 mg total)  by mouth 2 (two) times daily. (Patient not taking: Reported on 08/02/2020) 60 capsule 3  . hyoscyamine (LEVSIN SL) 0.125 MG SL tablet Take 1-2 tablets by mouth or under tongue every 4 hours as needed. (Patient not taking: Reported on 08/02/2020) 100 tablet 5  . ondansetron (ZOFRAN-ODT) 4 MG disintegrating tablet Take 1 tablet (4 mg total) by mouth every 8 (eight) hours as needed for nausea or vomiting. (Patient not taking: Reported on 08/02/2020) 20 tablet 0  . predniSONE (DELTASONE) 20 MG tablet Take 1 tablet (20 mg total) by mouth daily with breakfast. (Patient not taking: Reported on 08/02/2020) 7 tablet 0  . XIIDRA 5 % SOLN Place 1 drop into both eyes 2 (two) times daily. (Patient not taking: Reported on 08/02/2020)  3   No current facility-administered medications on file prior to visit.        ROS:  All others reviewed and negative.  Objective        PE:  BP (!) 142/80 (BP Location: Right Arm, Patient Position: Sitting, Cuff Size: Large)   Pulse 68   Temp 100.1 F (37.8 C) (Oral)   Ht 5\' 8"  (1.727 m)   Wt 223 lb (101.2 kg)   SpO2 97%   BMI 33.91 kg/m                 Constitutional: Pt appears in NAD               HENT: Head: NCAT.                Right Ear: External ear normal.                 Left Ear: External ear normal.                Eyes: . Pupils are  equal, round, and reactive to light. Conjunctivae and EOM are normal               Nose: without d/c or deformity               Neck: Neck supple. Gross normal ROM               Cardiovascular: Normal rate and regular rhythm.                 Pulmonary/Chest: Effort normal and breath sounds without rales or wheezing.                Abd:  Soft, mod low mid abd tender, ND, + BS, no organomegaly, no flank tender               Neurological: Pt is alert. At baseline orientation, motor grossly intact               Skin: Skin is warm. No rashes, no other new lesions, LE edema - none               Psychiatric: Pt behavior is normal without agitation   Micro: none  Cardiac tracings I have personally interpreted today:  none  Pertinent Radiological findings (summarize): none   Lab Results  Component Value Date   WBC 6.4 10/16/2019   HGB 11.9 10/16/2019   HCT 35.5 10/16/2019   PLT 170 10/16/2019   GLUCOSE 124 (H) 10/16/2019   CHOL 125 10/16/2019   TRIG 145 10/16/2019   HDL 49 (L) 10/16/2019   LDLDIRECT 88.0 01/14/2015   LDLCALC 53 10/16/2019   ALT 14 10/16/2019  AST 15 10/16/2019   NA 140 10/16/2019   K 3.8 10/16/2019   CL 107 10/16/2019   CREATININE 1.08 (H) 10/16/2019   BUN 15 10/16/2019   CO2 25 10/16/2019   TSH 0.42 10/16/2019   INR 1.1 ratio (H) 05/23/2009   HGBA1C 6.7 (A) 04/20/2020   MICROALBUR 1.1 10/16/2019   Assessment/Plan:  Savannah [name] is a 65 y.o. White or Caucasian [1] female with  has a past medical history of Allergic rhinitis, Asthma, B12 deficiency, Cancer ([place]) (2016), Depression, Grade I internal hemorrhoids, Hiatal hernia, History of colon polyps, History of kidney stones, History of renal cell carcinoma (urologist-  dr Tresa Moore), Hypercholesterolemia, Hypertension, Hypothyroidism, Iron deficiency anemia, Lumbar spondylosis, NAFLD (nonalcoholic fatty liver disease), Nocturia, OA (osteoarthritis of spine), OSA on CPAP (pulmologist-  dr Halford Chessman), Peripheral  neuropathy, Renal calculus, right, Sleep apnea, Type 2 diabetes mellitus ([place]), Vitamin B 12 deficiency, and Wears glasses.  Abdominal pain Presentation atypical but suspect UTI - for rocephin IM 1 gm, levaquin course after urine cx and cbc, bmp and labs as ordered  Essential hypertension BP Readings from Last 3 Encounters:  08/02/20 (!) 142/80  04/20/20 130/80  02/03/20 130/90   Mild uncontrolled, likely reactive,  pt to continue medical treatment losartan 100   Hypothyroidism Lab Results  Component Value Date   TSH 0.42 10/16/2019   Stable, pt to continue levothyroxine   Diabetes ([place]) Lab Results  Component Value Date   HGBA1C 6.7 (A) 04/20/2020   Stable, pt to continue current medical treatment farxiga, metformin   Followup: Return if symptoms worsen or fail to improve.  Cathlean Cower, MD 08/02/2020 8:08 PM [facility]

## 2020-08-03 ENCOUNTER — Telehealth: Payer: Self-pay | Admitting: Internal Medicine

## 2020-08-03 ENCOUNTER — Encounter: Payer: Self-pay | Admitting: Internal Medicine

## 2020-08-03 LAB — HEPATIC FUNCTION PANEL
ALT: 22 U/L (ref 0–35)
AST: 24 U/L (ref 0–37)
Albumin: 4.3 g/dL (ref 3.5–5.2)
Alkaline Phosphatase: 64 U/L (ref 39–117)
Bilirubin, Direct: 0.3 mg/dL (ref 0.0–0.3)
Total Bilirubin: 1.8 mg/dL — ABNORMAL HIGH (ref 0.2–1.2)
Total Protein: 7.1 g/dL (ref 6.0–8.3)

## 2020-08-03 LAB — CBC WITH DIFFERENTIAL/PLATELET
Basophils Absolute: 0 10*3/uL (ref 0.0–0.1)
Basophils Relative: 0.2 % (ref 0.0–3.0)
Eosinophils Absolute: 0.3 10*3/uL (ref 0.0–0.7)
Eosinophils Relative: 3.7 % (ref 0.0–5.0)
HCT: 39.4 % (ref 36.0–46.0)
Hemoglobin: 13.3 g/dL (ref 12.0–15.0)
Lymphocytes Relative: 25.8 % (ref 12.0–46.0)
Lymphs Abs: 2.1 10*3/uL (ref 0.7–4.0)
MCHC: 33.7 g/dL (ref 30.0–36.0)
MCV: 87.2 fl (ref 78.0–100.0)
Monocytes Absolute: 0.5 10*3/uL (ref 0.1–1.0)
Monocytes Relative: 5.6 % (ref 3.0–12.0)
Neutro Abs: 5.2 10*3/uL (ref 1.4–7.7)
Neutrophils Relative %: 64.7 % (ref 43.0–77.0)
Platelets: 185 10*3/uL (ref 150.0–400.0)
RBC: 4.52 Mil/uL (ref 3.87–5.11)
RDW: 14.1 % (ref 11.5–15.5)
WBC: 8.1 10*3/uL (ref 4.0–10.5)

## 2020-08-03 LAB — URINE CULTURE

## 2020-08-03 LAB — URINALYSIS, ROUTINE W REFLEX MICROSCOPIC
Bilirubin Urine: NEGATIVE
Ketones, ur: NEGATIVE
Leukocytes,Ua: NEGATIVE
Nitrite: NEGATIVE
Specific Gravity, Urine: 1.02 (ref 1.000–1.030)
Total Protein, Urine: NEGATIVE
Urine Glucose: >=1000 — AB
Urobilinogen, UA: 0.2 (ref 0.0–1.0)
pH: 5.5 (ref 5.0–8.0)

## 2020-08-03 LAB — BASIC METABOLIC PANEL
BUN: 20 mg/dL (ref 6–23)
CO2: 25 mEq/L (ref 19–32)
Calcium: 9.2 mg/dL (ref 8.4–10.5)
Chloride: 105 mEq/L (ref 96–112)
Creatinine, Ser: 1.21 mg/dL — ABNORMAL HIGH (ref 0.40–1.20)
GFR: 46.88 mL/min — ABNORMAL LOW (ref >60.00)
Glucose, Bld: 108 mg/dL — ABNORMAL HIGH (ref 70–99)
Potassium: 4 mEq/L (ref 3.5–5.1)
Sodium: 141 mEq/L (ref 135–145)

## 2020-08-03 LAB — TSH: TSH: 0.28 u[IU]/mL — ABNORMAL LOW (ref 0.35–4.50)

## 2020-08-03 LAB — LIPASE: Lipase: 37 U/L (ref 11.0–59.0)

## 2020-08-03 NOTE — Telephone Encounter (Signed)
1.Medication Requested: ADVAIR DISKUS 100-50 MCG/DOSE AEPB  albuterol (PROAIR HFA) 108 (90 Base) MCG/ACT inhaler  ondansetron (ZOFRAN-ODT) 4 MG disintegrating tablet  levofloxacin (LEVAQUIN) 500 MG tablet- patient said that pharmacy did not receive medication    2. Pharmacy (Name, Street, [place]): [id number] - [place], [place]  3. On Med List: yes   4. Last Visit with PCP: 08/02/20  5. Next visit date with PCP: 10/18/20   Agent: Please be advised that RX refills may take up to 3 business days. We ask that you follow-up with your pharmacy.

## 2020-08-04 ENCOUNTER — Other Ambulatory Visit: Payer: Self-pay

## 2020-08-04 MED ORDER — LEVOFLOXACIN 500 MG PO TABS: 500.0000 mg | ORAL_TABLET | Freq: Every day | ORAL | 0 refills | Status: AC

## 2020-08-04 MED ORDER — ONDANSETRON 4 MG PO TBDP: 4.0000 mg | ORAL_TABLET | Freq: Three times a day (TID) | ORAL | 0 refills | Status: AC | PRN

## 2020-08-04 MED ORDER — ALBUTEROL SULFATE HFA 108 (90 BASE) MCG/ACT IN AERS: 2.0000 | INHALATION_SPRAY | RESPIRATORY_TRACT | 3 refills | Status: AC | PRN

## 2020-08-04 MED ORDER — FLUTICASONE-SALMETEROL 100-50 MCG/ACT IN AEPB: 1.0000 | INHALATION_SPRAY | Freq: Two times a day (BID) | RESPIRATORY_TRACT | 3 refills | Status: AC

## 2020-08-20 ENCOUNTER — Other Ambulatory Visit: Payer: Self-pay | Admitting: Internal Medicine

## 2020-08-24 ENCOUNTER — Ambulatory Visit (INDEPENDENT_AMBULATORY_CARE_PROVIDER_SITE_OTHER): Payer: BC Managed Care – PPO | Admitting: Pulmonary Disease

## 2020-08-24 ENCOUNTER — Encounter: Payer: Self-pay | Admitting: Pulmonary Disease

## 2020-08-24 ENCOUNTER — Other Ambulatory Visit: Payer: Self-pay

## 2020-08-24 VITALS — BP 138/90 | HR 90 | Temp 99.3°F | Ht 68.0 in | Wt 226.1 lb

## 2020-08-24 DIAGNOSIS — G4733 Obstructive sleep apnea (adult) (pediatric): Secondary | ICD-10-CM

## 2020-08-24 NOTE — Progress Notes (Signed)
Savannah [name], Savannah [name], Savannah [name]  Chief Complaint  Patient presents with   Sleep Apnea    Follow up. Hasn't been seen in about 3 years. States CPAP machine is on it's last cycle.     Constitutional:  BP 138/90 (BP Location: Right Arm, Patient Position: Sitting, Cuff Size: Large)   Pulse 90   Temp 99.3 F (37.4 C) (Oral)   Ht 5\' 8"  (1.727 m)   Wt 226 lb 2 oz (102.6 kg)   SpO2 97% Comment: room air  BMI 34.38 kg/m   Past Medical History:  Allergies, B12 deficiency, Renal cancer, Depression, Hiatal hernia, Colon polyp, Nephrolithiasis, HLD, HTN, Hypothyroidism, Anemia, Fatty liver, OA, Neuropathy, DM type 2  Past Surgical History:  [name]  has a past surgical history that includes Knee arthroscopy (Left, 1993); Robotic assited partial nephrectomy (Right, 05/19/2014); Cardiac catheterization (06-01-2009  dr Angelena Form); Cardiovascular stress test (05-11-2009   dr Angelena Form); BUNIONECTOMY/  HAMMERTOE CORRECTION'S (09-07-2013   [facility]); Cystoscopy with retrograde pyelogram, ureteroscopy Savannah [name] placement (Right, 06/07/2017); Holmium laser application (Right, 02/28/7251); COLONOSCOPY WITH SNARE [name] WITH ESOPHAGOGASTRODUODENOSCOPY (02/08/2014); Fracture surgery (Right); Cystoscopy with retrograde pyelogram, ureteroscopy Savannah [name] placement (Right, 06/21/2017); Holmium laser application (Right, 6/64/4034); Colonoscopy (last 02/08/2014); Savannah [name].  Brief Summary:  Savannah [name] is a 65 y.o. female with obstructive sleep apnea.      Subjective:   I last saw her in 2019.  [name] uses [employer] for her DME.  Has nasal mask.  Uses CPAP nightly.  Wants to try a different mask.  Her machine is very old Savannah [name] is worried it is starting to wear out.  Goes to bed between 8 Savannah 11.  Uses trazodone.  Sleeps through the night.  Wakes up at 8 am.  Feels rested.  Not napping.  Physical Exam:   Appearance - well kempt   ENMT - no sinus tenderness, no oral exudate, no LAN,  Mallampati 3 airway, no stridor  Respiratory - equal breath sounds bilaterally, no wheezing or rales  CV - s1s2 regular rate Savannah rhythm, no murmurs  Ext - no clubbing, no edema  Skin - no rashes  Psych - normal mood Savannah affect   Sleep Tests:  PSG 09/11/04 >> AHI 119, SpO2 low 75% CPAP 04/15/16 to 05/14/17 >> used on 30 of 30 nights with average 11 hrs 46 min.  Average AHI 4.9 with CPAP 12 cm H2O  Social History:  [name]  reports that [name] quit smoking about 45 years ago. Her smoking use included cigarettes. [name] has a 0.60 pack-year smoking history. [name] has never used smokeless tobacco. [name] reports that [name] does not drink alcohol Savannah does not use drugs.  Family History:  Her family history includes Allergies in her sister; Asthma in her sister; Clotting disorder in her grandchild; Colon cancer (age of onset: 35) in her mother; Colon polyps in her mother; Diabetes in her mother; Heart attack in her father; Heart disease in her father Savannah paternal grandmother; Kidney disease in her mother.     Assessment/Plan:   Obstructive sleep apnea. - [name] is compliant with CPAP Savannah reports benefit from therapy - [name] uses [employer] for her DME - her device is more than 66 yrs old, not functioning properly Savannah not amenable to repair - will arrange for auto CPAP 8 to 14 cm H2O; [name] is okay with getting a Luna or Resmed device - explained her insurance might require a new sleep study prior to getting a  new CPAP machine - will arrange for mask refitting  Time Spent Involved in Patient [name] on Day of Examination:  24 minutes  Follow up:   Patient Instructions  Will arrange for new CPAP machine Savannah CPAP mask refitting  Can look up mask options at [email or web address] or similar website  Follow up in 4 months  Medication List:   Allergies as of 08/24/2020       Reactions   Erythromycin Nausea Savannah Vomiting        Medication List        Accurate as of August 24, 2020 11:08 AM. If you have any questions, ask  your nurse or doctor.          albuterol 108 (90 Base) MCG/ACT inhaler Commonly known as: ProAir HFA Inhale 2 puffs into the lungs every 4 (four) hours as needed for wheezing or shortness of breath (cough).   ALPRAZolam 0.5 MG tablet Commonly known as: Xanax Take 1 tablet (0.5 mg total) by mouth at bedtime as needed for anxiety.   atorvastatin 40 MG tablet Commonly known as: LIPITOR TAKE 1 TABLET BY MOUTH EVERY DAY   cholecalciferol 1000 units tablet Commonly known as: VITAMIN D Take 1,000 Units by mouth 2 (two) times daily.   citalopram 20 MG tablet Commonly known as: CELEXA TAKE 1 TABLET BY MOUTH EVERY DAY IN THE MORNING   Farxiga 5 MG Tabs tablet Generic drug: dapagliflozin propanediol Take 5 mg by mouth daily.   fluticasone-salmeterol 100-50 MCG/ACT Aepb Commonly known as: ADVAIR Inhale 1 puff into the lungs 2 (two) times daily.   gabapentin 100 MG capsule Commonly known as: NEURONTIN Take 1 capsule (100 mg total) by mouth 2 (two) times daily.   gabapentin 300 MG capsule Commonly known as: NEURONTIN TAKE 1 CAPSULE BY MOUTH EVERYDAY AT BEDTIME   hyoscyamine 0.125 MG SL tablet Commonly known as: LEVSIN SL Take 1-2 tablets by mouth or under tongue every 4 hours as needed.   levothyroxine 137 MCG tablet Commonly known as: SYNTHROID TAKE 1 TABLET (137 MCG TOTAL) BY MOUTH DAILY BEFORE BREAKFAST.   losartan 100 MG tablet Commonly known as: COZAAR Take 100 mg by mouth daily.   metFORMIN 500 MG 24 hr tablet Commonly known as: GLUCOPHAGE-XR TAKE 2 TABLETS BY MOUTH TWICE A DAY   metoprolol succinate 25 MG 24 hr tablet Commonly known as: TOPROL-XL TAKE 1/2 TABLET BY MOUTH EVERY DAY   ondansetron 4 MG disintegrating tablet Commonly known as: ZOFRAN-ODT Take 1 tablet (4 mg total) by mouth every 8 (eight) hours as needed for nausea or vomiting.   Oxycodone HCl 10 MG Tabs Take 10 mg by mouth daily as needed (pain).   predniSONE 20 MG tablet Commonly known as:  DELTASONE Take 1 tablet (20 mg total) by mouth daily with breakfast.   Restasis 0.05 % ophthalmic emulsion Generic drug: cycloSPORINE 1 drop 2 (two) times daily.   Santyl ointment Generic drug: collagenase Santyl 250 unit/gram topical ointment   traZODone 100 MG tablet Commonly known as: DESYREL TAKE 1 TABLET BY MOUTH EVERYDAY AT BEDTIME   Xiidra 5 % Soln Generic drug: Lifitegrast Place 1 drop into both eyes 2 (two) times daily.        Signature:  Chesley Mires, MD [place] Pager - [phone] 08/24/2020, 11:08 AM

## 2020-08-24 NOTE — Patient Instructions (Signed)
Will arrange for new CPAP machine and CPAP mask refitting  Can look up mask options at [email or web address] or similar website  Follow up in 4 months

## 2020-10-18 ENCOUNTER — Encounter: Payer: Self-pay | Admitting: Internal Medicine

## 2020-10-18 ENCOUNTER — Ambulatory Visit (INDEPENDENT_AMBULATORY_CARE_PROVIDER_SITE_OTHER): Payer: BC Managed Care – PPO | Admitting: Internal Medicine

## 2020-10-18 ENCOUNTER — Other Ambulatory Visit: Payer: Self-pay

## 2020-10-18 VITALS — BP 138/60 | HR 70 | Temp 98.8°F | Ht 68.0 in | Wt 224.0 lb

## 2020-10-18 DIAGNOSIS — E1165 Type 2 diabetes mellitus with hyperglycemia: Secondary | ICD-10-CM | POA: Diagnosis not present

## 2020-10-18 DIAGNOSIS — E7800 Pure hypercholesterolemia, unspecified: Secondary | ICD-10-CM

## 2020-10-18 DIAGNOSIS — J309 Allergic rhinitis, unspecified: Secondary | ICD-10-CM

## 2020-10-18 DIAGNOSIS — I10 Essential (primary) hypertension: Secondary | ICD-10-CM

## 2020-10-18 LAB — POCT GLYCOSYLATED HEMOGLOBIN (HGB A1C): Hemoglobin A1C: 6.8 % — AB (ref 4.0–5.6)

## 2020-10-18 NOTE — Assessment & Plan Note (Signed)
BP Readings from Last 3 Encounters:  10/18/20 138/60  08/24/20 138/90  08/02/20 (!) 142/80   Stable, pt to continue medical treatment losratan, toprol

## 2020-10-18 NOTE — Patient Instructions (Signed)
Ok to restart the nasacort for the allergies  Your A1c was OK today  Please continue all other medications as before, and refills have been done if requested.  Please have the pharmacy call with any other refills you may need.  Please continue your efforts at being more active, low cholesterol diet, and weight control..  Please keep your appointments with your specialists as you may have planned  Please make an Appointment to return in 6 months, or sooner if needed

## 2020-10-18 NOTE — Assessment & Plan Note (Signed)
Lab Results  Component Value Date   LDLCALC 53 10/16/2019   Stable, goal ldl < 70, pt to continue current statin lipitor

## 2020-10-18 NOTE — Assessment & Plan Note (Signed)
Lab Results  Component Value Date   HGBA1C 6.8 (A) 10/18/2020   Mild elevated a1c but less than 7, pt to continue current medical treatment farxiga and metformin

## 2020-10-18 NOTE — Assessment & Plan Note (Signed)
Mild to mod uncontrolled, pt to restart otc nasacort asd,  to f/u any worsening symptoms or concerns

## 2020-10-18 NOTE — Progress Notes (Signed)
Patient ID: Savannah [name], female   DOB: 08-29-1955, 65 y.o.   MRN: [id number]        Chief Complaint: follow up HTN, HLD and hyperglycemia, allergies       HPI:  Savannah [name] is a 65 y.o. female here Does have several wks ongoing nasal allergy symptoms with clearish congestion, itch and sneezing, without fever, pain, ST, cough, swelling or wheezing  Pt denies chest pain, increased sob or doe, wheezing, orthopnea, PND, increased LE swelling, palpitations, dizziness or syncope.   Pt denies polydipsia, polyuria, or new focal neuro s/s.   Pt denies fever, wt loss, night sweats, loss of appetite, or other constitutional symptoms  No new complaints       Wt Readings from Last 3 Encounters:  10/18/20 224 lb (101.6 kg)  08/24/20 226 lb 2 oz (102.6 kg)  08/02/20 223 lb (101.2 kg)   BP Readings from Last 3 Encounters:  10/18/20 138/60  08/24/20 138/90  08/02/20 (!) 142/80         Past Medical History:  Diagnosis Date   Allergic rhinitis    Asthma    B12 deficiency    Cancer ([facility]) 2016   kidney    Depression    Grade I internal hemorrhoids    Hiatal hernia    History of colon polyps    History of kidney stones    History of renal cell carcinoma urologist-  dr Tresa Moore   dx 2016--  s/p 05-19-2014 partial right nephrectomy -- Clear Cell carcinoma ([id number]),  clinically  localized w/ negative margins   Hypercholesterolemia    Hypertension    Hypothyroidism    endocrinologist -  dr ellision   Iron deficiency anemia    Lumbar spondylosis    NAFLD (nonalcoholic fatty liver disease)    dx 2010--- last hepatic panel in epic dated 04-17-2017   Nocturia    OA (osteoarthritis of spine)    Lumbar   OSA on CPAP pulmologist-  dr Halford Chessman   sleep study 09-11-2004  very severe osa , AHI 119/hr,  setting 10   Peripheral neuropathy    bottom of feet   Renal calculus, right    Sleep apnea    uses CPAP   Type 2 diabetes mellitus ([place])    endocrinoloigst-  dr Loanne Drilling,  last A1c 5.9 on 02-20-219  in epic   Vitamin B 12 deficiency    Wears glasses    Past Surgical History:  Procedure Laterality Date   BUNIONECTOMY/  HAMMERTOE CORRECTION'S  09-07-2013   SCG   RIGHT FOOT 2ND, 3RD, 4TH, 5TH DIGITS   CARDIAC CATHETERIZATION  06-01-2009  dr Angelena Form   no evidence CAD,  normal LVSF, elevated blood pressure w/ elevated end-diastolic pressure, ef [id number]   CARDIOVASCULAR STRESS TEST  05-11-2009   dr Angelena Form   abnormal lexiscan nuclear study (no exercise) w/ mild ischemia in the distal anteroseptal wall and apex, this defect may be due to shifting breast attenuation   COLONOSCOPY  last 02/08/2014   COLONOSCOPY WITH SNARE POLYPECTOMY WITH ESOPHAGOGASTRODUODENOSCOPY  02/08/2014   with biopsy Dr. Jerilynn Mages. [facility]   CYSTOSCOPY WITH RETROGRADE PYELOGRAM, URETEROSCOPY AND STENT PLACEMENT Right 06/07/2017   Procedure: CYSTOSCOPY WITH RETROGRADE PYELOGRAM, URETEROSCOPY, LITHOTRIPSY,  AND STENT PLACEMENT 1st STAGE;  Surgeon: Alexis Frock, MD;  Location: [facility];  Service: Urology;  Laterality: Right;   CYSTOSCOPY WITH RETROGRADE PYELOGRAM, URETEROSCOPY AND STENT PLACEMENT Right 06/21/2017   Procedure: CYSTOSCOPY WITH RETROGRADE PYELOGRAM, URETEROSCOPY AND STENT  PLACEMENT 2ND STAGE  STENT" EXCHANGE";  Surgeon: Alexis Frock, MD;  Location: [facility];  Service: Urology;  Laterality: Right;   FRACTURE SURGERY Right    Compound fracture foot   HOLMIUM LASER APPLICATION Right [id number]   Procedure: HOLMIUM LASER APPLICATION;  Surgeon: Alexis Frock, MD;  Location: [facility];  Service: Urology;  Laterality: Right;   HOLMIUM LASER APPLICATION Right [id number]   Procedure: HOLMIUM LASER APPLICATION;  Surgeon: Alexis Frock, MD;  Location: [facility];  Service: Urology;  Laterality: Right;   KNEE ARTHROSCOPY Left 1993   POLYPECTOMY     ROBOTIC ASSITED PARTIAL NEPHRECTOMY Right 05/19/2014   Procedure: ROBOTIC ASSITED PARTIAL NEPHRECTOMY;   Surgeon: Alexis Frock, MD;  Location: [facility];  Service: Urology;  Laterality: Right;    reports that she quit smoking about 45 years ago. Her smoking use included cigarettes. She has a 0.60 pack-year smoking history. She has never used smokeless tobacco. She reports that she does not drink alcohol and does not use drugs. family history includes Allergies in her sister; Asthma in her sister; Clotting disorder in her grandchild; Colon cancer (age of onset: 79) in her mother; Colon polyps in her mother; Diabetes in her mother; Heart attack in her father; Heart disease in her father and paternal grandmother; Kidney disease in her mother. Allergies  Allergen Reactions   Erythromycin Nausea And Vomiting   Current Outpatient Medications on File Prior to Visit  Medication Sig Dispense Refill   albuterol (PROAIR HFA) 108 (90 Base) MCG/ACT inhaler Inhale 2 puffs into the lungs every 4 (four) hours as needed for wheezing or shortness of breath (cough). 3 each 3   atorvastatin (LIPITOR) 40 MG tablet TAKE 1 TABLET BY MOUTH EVERY DAY 90 tablet 2   cholecalciferol (VITAMIN D) 1000 units tablet Take 1,000 Units by mouth 2 (two) times daily.     citalopram (CELEXA) 20 MG tablet TAKE 1 TABLET BY MOUTH EVERY DAY IN THE MORNING 90 tablet 2   collagenase (SANTYL) ointment Santyl 250 unit/gram topical ointment     FARXIGA 5 MG TABS tablet Take 5 mg by mouth daily.     fluticasone-salmeterol (ADVAIR) 100-50 MCG/ACT AEPB Inhale 1 puff into the lungs 2 (two) times daily. 3 each 3   gabapentin (NEURONTIN) 300 MG capsule TAKE 1 CAPSULE BY MOUTH EVERYDAY AT BEDTIME 90 capsule 1   hyoscyamine (LEVSIN SL) 0.125 MG SL tablet Take 1-2 tablets by mouth or under tongue every 4 hours as needed. 100 tablet 5   levothyroxine (SYNTHROID) 137 MCG tablet TAKE 1 TABLET (137 MCG TOTAL) BY MOUTH DAILY BEFORE BREAKFAST. 90 tablet 2   losartan (COZAAR) 100 MG tablet Take 100 mg by mouth daily.     metFORMIN (GLUCOPHAGE-XR) 500 MG 24 hr  tablet TAKE 2 TABLETS BY MOUTH TWICE A DAY 360 tablet 2   metoprolol succinate (TOPROL-XL) 25 MG 24 hr tablet TAKE 1/2 TABLET BY MOUTH EVERY DAY 45 tablet 3   ondansetron (ZOFRAN-ODT) 4 MG disintegrating tablet Take 1 tablet (4 mg total) by mouth every 8 (eight) hours as needed for nausea or vomiting. 20 tablet 0   Oxycodone HCl 10 MG TABS Take 10 mg by mouth daily as needed (pain).   0   predniSONE (DELTASONE) 20 MG tablet Take 1 tablet (20 mg total) by mouth daily with breakfast. 7 tablet 0   RESTASIS 0.05 % ophthalmic emulsion 1 drop 2 (two) times daily.     traZODone (DESYREL) 100  MG tablet TAKE 1 TABLET BY MOUTH EVERYDAY AT BEDTIME 90 tablet 1   XIIDRA 5 % SOLN Place 1 drop into both eyes 2 (two) times daily.  3   No current facility-administered medications on file prior to visit.        ROS:  All others reviewed and negative.  Objective        PE:  BP 138/60 (BP Location: Left Arm, Patient Position: Sitting, Cuff Size: Normal)   Pulse 70   Temp 98.8 F (37.1 C) (Oral)   Ht '5\' 8"'$  (1.727 m)   Wt 224 lb (101.6 kg)   SpO2 96%   BMI 34.06 kg/m                 Constitutional: Pt appears in NAD               HENT: Head: NCAT.                Right Ear: External ear normal.                 Left Ear: External ear normal.                Eyes: . Pupils are equal, round, and reactive to light. Conjunctivae and EOM are normal               Nose: without d/c or deformity               Neck: Neck supple. Gross normal ROM               Cardiovascular: Normal rate and regular rhythm.                 Pulmonary/Chest: Effort normal and breath sounds without rales or wheezing.                Abd:  Soft, NT, ND, + BS, no organomegaly               Neurological: Pt is alert. At baseline orientation, motor grossly intact               Skin: Skin is warm. No rashes, no other new lesions, LE edema - none               Psychiatric: Pt behavior is normal without agitation   Micro: none  Cardiac  tracings I have personally interpreted today:  none  Pertinent Radiological findings (summarize): none   Lab Results  Component Value Date   WBC 8.1 08/02/2020   HGB 13.3 08/02/2020   HCT 39.4 08/02/2020   PLT 185.0 08/02/2020   GLUCOSE 108 (H) 08/02/2020   CHOL 125 10/16/2019   TRIG 145 10/16/2019   HDL 49 (L) 10/16/2019   LDLDIRECT 88.0 01/14/2015   LDLCALC 53 10/16/2019   ALT 22 08/02/2020   AST 24 08/02/2020   NA 141 08/02/2020   K 4.0 08/02/2020   CL 105 08/02/2020   CREATININE 1.21 (H) 08/02/2020   BUN 20 08/02/2020   CO2 25 08/02/2020   TSH 0.28 (L) 08/02/2020   INR 1.1 ratio (H) 05/23/2009   HGBA1C 6.8 (A) 10/18/2020   MICROALBUR 1.1 10/16/2019   Hemoglobin A1C 4.0 - 5.6 % 6.8 Abnormal   6.7 Abnormal   6.0 High  R, CM  6.0 R, CM  5.8 R, CM  5.6  5.9 R     Assessment/Plan:  Savannah [name] is a 65 y.o. White or  Caucasian [1] female with  has a past medical history of Allergic rhinitis, Asthma, B12 deficiency, Cancer ([place]) (2016), Depression, Grade I internal hemorrhoids, Hiatal hernia, History of colon polyps, History of kidney stones, History of renal cell carcinoma (urologist-  dr Tresa Moore), Hypercholesterolemia, Hypertension, Hypothyroidism, Iron deficiency anemia, Lumbar spondylosis, NAFLD (nonalcoholic fatty liver disease), Nocturia, OA (osteoarthritis of spine), OSA on CPAP (pulmologist-  dr Halford Chessman), Peripheral neuropathy, Renal calculus, right, Sleep apnea, Type 2 diabetes mellitus ([place]), Vitamin B 12 deficiency, and Wears glasses.  Allergic rhinitis Mild to mod uncontrolled, pt to restart otc nasacort asd,  to f/u any worsening symptoms or concerns  Diabetes ([place]) Lab Results  Component Value Date   HGBA1C 6.8 (A) 10/18/2020   Mild elevated a1c but less than 7, pt to continue current medical treatment farxiga and metformin   Essential hypertension BP Readings from Last 3 Encounters:  10/18/20 138/60  08/24/20 138/90  08/02/20 (!) 142/80   Stable, pt to  continue medical treatment losratan, toprol   HYPERCHOLESTEROLEMIA Lab Results  Component Value Date   LDLCALC 53 10/16/2019   Stable, goal ldl < 70, pt to continue current statin lipitor  Followup: Return in about 6 months (around 04/20/2021).  Cathlean Cower, MD 10/18/2020 10:08 PM [facility]

## 2020-11-17 ENCOUNTER — Other Ambulatory Visit: Payer: Self-pay | Admitting: Internal Medicine

## 2020-11-17 NOTE — Telephone Encounter (Signed)
Please refill as per office routine med refill policy (all routine meds to be refilled for 3 mo or monthly (per pt preference) up to one year from last visit, then month to month grace period for 3 mo, then further med refills will have to be denied) ? ?

## 2020-12-05 ENCOUNTER — Other Ambulatory Visit: Payer: Self-pay | Admitting: Internal Medicine

## 2020-12-05 DIAGNOSIS — E119 Type 2 diabetes mellitus without complications: Secondary | ICD-10-CM

## 2020-12-05 NOTE — Telephone Encounter (Signed)
Please refill as per office routine med refill policy (all routine meds to be refilled for 3 mo or monthly (per pt preference) up to one year from last visit, then month to month grace period for 3 mo, then further med refills will have to be denied) ? ?

## 2020-12-16 ENCOUNTER — Other Ambulatory Visit: Payer: Self-pay | Admitting: Urology

## 2020-12-22 ENCOUNTER — Other Ambulatory Visit: Payer: Self-pay | Admitting: Internal Medicine

## 2020-12-22 NOTE — Telephone Encounter (Signed)
Please refill as per office routine med refill policy (all routine meds to be refilled for 3 mo or monthly (per pt preference) up to one year from last visit, then month to month grace period for 3 mo, then further med refills will have to be denied) ? ?

## 2021-01-02 ENCOUNTER — Other Ambulatory Visit: Payer: Self-pay | Admitting: Internal Medicine

## 2021-01-02 NOTE — Telephone Encounter (Signed)
Please refill as per office routine med refill policy (all routine meds to be refilled for 3 mo or monthly (per pt preference) up to one year from last visit, then month to month grace period for 3 mo, then further med refills will have to be denied) ? ?

## 2021-01-06 ENCOUNTER — Other Ambulatory Visit: Payer: Self-pay | Admitting: Internal Medicine

## 2021-01-06 NOTE — Patient Instructions (Addendum)
DUE TO COVID-19 ONLY ONE VISITOR IS ALLOWED TO COME WITH YOU AND STAY IN THE WAITING ROOM ONLY DURING PRE OP AND PROCEDURE.   **NO VISITORS ARE ALLOWED IN THE SHORT STAY AREA OR RECOVERY ROOM!!**  IF YOU WILL BE ADMITTED INTO THE HOSPITAL YOU ARE ALLOWED ONLY TWO SUPPORT PEOPLE DURING VISITATION HOURS ONLY (7 AM -8PM)   The support person(s) must pass our screening, gel in and out, and wear a mask at all times, including in the patient's room. Patients must also wear a mask when staff or their support person are in the room. Visitors GUEST BADGE MUST BE WORN VISIBLY  One adult visitor may remain with you overnight and MUST be in the room by 8 P.M.  No visitors under the age of 67. Any visitor under the age of 13 must be accompanied by an adult.    Your procedure is scheduled on: 01/27/21   Report to [facility] Main Entrance    Report to admitting at: 9:15 AM   Call this number if you have problems the morning of surgery [phone]   Do not eat food :After Midnight.   May have liquids until : 8:30 AM   day of surgery  CLEAR LIQUID DIET  Foods Allowed                                                                     Foods Excluded  Water, Black Coffee and tea, regular and decaf                             liquids that you cannot  Plain Jell-O in any flavor  (No red)                                           see through such as: Fruit ices (not with fruit pulp)                                     milk, soups, orange juice              Iced Popsicles (No red)                                    All solid food                                   Apple juices Sports drinks like Gatorade (No red) Lightly seasoned clear broth or consume(fat free) Sugar  Sample Menu Breakfast                                Lunch  Supper Cranberry juice                    Beef broth                            Chicken broth Jell-O                                      Grape juice                           Apple juice Coffee or tea                        Jell-O                                      Popsicle                                                Coffee or tea                        Coffee or tea     Oral Hygiene is also important to reduce your risk of infection.                                    Remember - BRUSH YOUR TEETH THE MORNING OF SURGERY WITH YOUR REGULAR TOOTHPASTE   Do NOT smoke after Midnight   Take these medicines the morning of surgery with A SIP OF WATER: Celexa (citalopram),metoprolol,synthroid.Use inhalers as usual.  How to Manage Your Diabetes Before and After Surgery  Why is it important to control my blood sugar before and after surgery? Improving blood sugar levels before and after surgery helps healing and can limit problems. A way of improving blood sugar control is eating a healthy diet by:  Eating less sugar and carbohydrates  Increasing activity/exercise  Talking with your doctor about reaching your blood sugar goals High blood sugars (greater than 180 mg/dL) can raise your risk of infections and slow your recovery, so you will need to focus on controlling your diabetes during the weeks before surgery. Make sure that the doctor who takes care of your diabetes knows about your planned surgery including the date and location.  How do I manage my blood sugar before surgery? Check your blood sugar at least 4 times a day, starting 2 days before surgery, to make sure that the level is not too high or low. Check your blood sugar the morning of your surgery when you wake up and every 2 hours until you get to the Short Stay unit. If your blood sugar is less than 70 mg/dL, you will need to treat for low blood sugar: Do not take insulin. Treat a low blood sugar (less than 70 mg/dL) with  cup of clear juice (cranberry or apple), 4 glucose tablets, OR glucose gel. Recheck blood sugar in 15 minutes after treatment (to make  sure it is greater than 70  mg/dL). If your blood sugar is not greater than 70 mg/dL on recheck, call [phone] for further instructions. Report your blood sugar to the short stay nurse when you get to Short Stay.  If you are admitted to the hospital after surgery: Your blood sugar will be checked by the staff and you will probably be given insulin after surgery (instead of oral diabetes medicines) to make sure you have good blood sugar levels. The goal for blood sugar control after surgery is 80-180 mg/dL.   WHAT DO I DO ABOUT MY DIABETES MEDICATION?  Do not take oral diabetes medicines (pills) the morning of surgery.  THE  BEFORE SURGERY, take Metformin as usual. DO NOT take [place].     THE MORNING OF SURGERY, DO NOT TAKE ANY ORAL DIABETIC MEDICATIONS DAY OF YOUR SURGERY                              You may not have any metal on your body including hair pins, jewelry, and body piercing             Do not wear make-up, lotions, powders, perfumes/cologne, or deodorant  Do not wear nail polish including gel and S&S, artificial/acrylic nails, or any other type of covering on natural nails including finger and toenails. If you have artificial nails, gel coating, etc. that needs to be removed by a nail salon please have this removed prior to surgery or surgery may need to be canceled/ delayed if the surgeon/ anesthesia feels like they are unable to be safely monitored.   Do not shave  48 hours prior to surgery.    Do not bring valuables to the hospital. [place].   Contacts, dentures or bridgework may not be worn into surgery.   Bring small overnight bag day of surgery.    Patients discharged on the day of surgery will not be allowed to drive home.   Special Instructions: Bring a copy of your healthcare power of attorney and living will documents         the day of surgery if you haven't scanned them before.              Please read  over the following fact sheets you were given: IF YOU HAVE QUESTIONS ABOUT YOUR PRE-OP INSTRUCTIONS PLEASE CALL [phone]     [facility] - Preparing for Surgery Before surgery, you can play an important role.  Because skin is not sterile, your skin needs to be as free of germs as possible.  You can reduce the number of germs on your skin by washing with CHG (chlorahexidine gluconate) soap before surgery.  CHG is an antiseptic cleaner which kills germs and bonds with the skin to continue killing germs even after washing. Please DO NOT use if you have an allergy to CHG or antibacterial soaps.  If your skin becomes reddened/irritated stop using the CHG and inform your nurse when you arrive at Short Stay. Do not shave (including legs and underarms) for at least 48 hours prior to the first CHG shower.  You may shave your face/neck. Please follow these instructions carefully:  1.  Shower with CHG Soap the night before surgery and the  morning of Surgery.  2.  If you choose to wash your hair, wash your hair first as usual with your  normal  shampoo.  3.  After you shampoo, rinse your hair and body thoroughly to remove the  shampoo.                           4.  Use CHG as you would any other liquid soap.  You can apply chg directly  to the skin and wash                       Gently with a scrungie or clean washcloth.  5.  Apply the CHG Soap to your body ONLY FROM THE NECK DOWN.   Do not use on face/ open                           Wound or open sores. Avoid contact with eyes, ears mouth and genitals (private parts).                       Wash face,  Genitals (private parts) with your normal soap.             6.  Wash thoroughly, paying special attention to the area where your surgery  will be performed.  7.  Thoroughly rinse your body with warm water from the neck down.  8.  DO NOT shower/wash with your normal soap after using and rinsing off  the CHG Soap.                9.  Pat yourself dry with a  clean towel.            10.  Wear clean pajamas.            11.  Place clean sheets on your bed the night of your first shower and do not  sleep with pets. Day of Surgery : Do not apply any lotions/deodorants the morning of surgery.  Please wear clean clothes to the hospital/surgery center.  FAILURE TO FOLLOW THESE INSTRUCTIONS MAY RESULT IN THE CANCELLATION OF YOUR SURGERY PATIENT SIGNATURE_________________________________  NURSE SIGNATURE__________________________________  ________________________________________________________________________

## 2021-01-10 ENCOUNTER — Encounter (HOSPITAL_COMMUNITY)
Admission: RE | Admit: 2021-01-10 | Discharge: 2021-01-10 | Disposition: A | Discharge status: Home / Self Care | Payer: BC Managed Care – PPO | Source: Ambulatory Visit | Attending: Urology | Admitting: Urology

## 2021-01-10 ENCOUNTER — Other Ambulatory Visit: Payer: Self-pay

## 2021-01-10 ENCOUNTER — Encounter (HOSPITAL_COMMUNITY): Payer: Self-pay

## 2021-01-10 VITALS — BP 158/57 | HR 71 | Temp 98.2°F | Ht 68.0 in | Wt 213.5 lb

## 2021-01-10 DIAGNOSIS — Z01818 Encounter for other preprocedural examination: Secondary | ICD-10-CM | POA: Insufficient documentation

## 2021-01-10 DIAGNOSIS — E119 Type 2 diabetes mellitus without complications: Secondary | ICD-10-CM | POA: Insufficient documentation

## 2021-01-10 LAB — CBC
HCT: 39.9 % (ref 36.0–46.0)
Hemoglobin: 13.3 g/dL (ref 12.0–15.0)
MCH: 29.6 pg (ref 26.0–34.0)
MCHC: 33.3 g/dL (ref 30.0–36.0)
MCV: 88.9 fL (ref 80.0–100.0)
Platelets: 169 10*3/uL (ref 150–400)
RBC: 4.49 MIL/uL (ref 3.87–5.11)
RDW: 13.5 % (ref 11.5–15.5)
WBC: 7.1 10*3/uL (ref 4.0–10.5)
nRBC: 0 % (ref 0.0–0.2)

## 2021-01-10 LAB — BASIC METABOLIC PANEL
Anion gap: 11 (ref 5–15)
BUN: 19 mg/dL (ref 8–23)
CO2: 21 mmol/L — ABNORMAL LOW (ref 22–32)
Calcium: 9.4 mg/dL (ref 8.9–10.3)
Chloride: 107 mmol/L (ref 98–111)
Creatinine, Ser: 1.06 mg/dL — ABNORMAL HIGH (ref 0.44–1.00)
GFR, Estimated: 58 mL/min — ABNORMAL LOW (ref >60)
Glucose, Bld: 117 mg/dL — ABNORMAL HIGH (ref 70–99)
Potassium: 3.6 mmol/L (ref 3.5–5.1)
Sodium: 139 mmol/L (ref 135–145)

## 2021-01-10 LAB — HEMOGLOBIN A1C
Hgb A1c MFr Bld: 5.2 % (ref 4.8–5.6)
Mean Plasma Glucose: 102.54 mg/dL

## 2021-01-10 LAB — GLUCOSE, CAPILLARY: Glucose-Capillary: 140 mg/dL — ABNORMAL HIGH (ref 70–99)

## 2021-01-10 NOTE — Progress Notes (Signed)
COVID Vaccine Completed: Yes Date COVID Vaccine completed: 01/01/20. X 3 COVID vaccine manufacturer: Pfizer    COVID Test: N/A PCP -  Cardiologist -   Chest x-ray -  EKG -  Stress Test -  ECHO -  Cardiac Cath -  Pacemaker/ICD device last checked:  Sleep Study - Yes CPAP - Yes  Fasting Blood Sugar - N/A Checks Blood Sugar ___0__ times a day  Blood Thinner Instructions: Aspirin Instructions: Last Dose:  Anesthesia review: Hx: HTN,DIA,OSA(CPAP)  Patient denies shortness of breath, fever, cough and chest pain at PAT appointment   Patient verbalized understanding of instructions that were given to them at the PAT appointment. Patient was also instructed that they will need to review over the PAT instructions again at home before surgery.

## 2021-01-11 NOTE — Progress Notes (Signed)
Hgba1c on 01/10/21-5.2.

## 2021-01-12 ENCOUNTER — Encounter (HOSPITAL_BASED_OUTPATIENT_CLINIC_OR_DEPARTMENT_OTHER): Payer: Self-pay | Admitting: Urology

## 2021-01-12 ENCOUNTER — Other Ambulatory Visit: Payer: Self-pay

## 2021-01-12 NOTE — Progress Notes (Signed)
Spoke w/ via phone for pre-op interview---pt Lab needs dos----ISTAT              Lab results------01/10/21 CBC, BMP in Epic, EKG in chart & Epic COVID test -----patient states asymptomatic no test needed Arrive at -------0645 on 01/13/21 NPO after MN NO Solid Food.  Clear liquids from MN until---0545 Med rec completed Medications to take morning of surgery -----Albuterol inhaler, Lipitor, Celexa, Advair inhaler, Levsin prn, Synthroid, Toprol, Zofran, Restasis Diabetic medication -----Hold Metformin and Farxiga morning of surgery Patient instructed no nail polish to be worn day of surgery Patient instructed to bring photo id and insurance card day of surgery Patient aware to have Driver (ride ) / caregiver    for 24 hours after surgery  - daughter Gae Bon Patient Special Instructions -----Bring inhalers. Bring CPAP. Pre-Op special Istructions -----Pt ambulates with cane. Patient verbalized understanding of instructions that were given at this phone interview. Patient denies shortness of breath, chest pain, fever, cough at this phone interview.   Pulmonologist: Dr. Chesley Mires LOV 08/24/20 for sleep apnea in Epic.

## 2021-01-13 ENCOUNTER — Ambulatory Visit (HOSPITAL_BASED_OUTPATIENT_CLINIC_OR_DEPARTMENT_OTHER): Payer: BC Managed Care – PPO | Admitting: Anesthesiology

## 2021-01-13 ENCOUNTER — Ambulatory Visit (HOSPITAL_BASED_OUTPATIENT_CLINIC_OR_DEPARTMENT_OTHER)
Admission: RE | Admit: 2021-01-13 | Discharge: 2021-01-13 | Disposition: A | Discharge status: Home / Self Care | Payer: BC Managed Care – PPO | Attending: Urology | Admitting: Urology

## 2021-01-13 ENCOUNTER — Encounter (HOSPITAL_BASED_OUTPATIENT_CLINIC_OR_DEPARTMENT_OTHER): Payer: Self-pay | Admitting: Urology

## 2021-01-13 ENCOUNTER — Encounter (HOSPITAL_BASED_OUTPATIENT_CLINIC_OR_DEPARTMENT_OTHER)
Admission: RE | Disposition: A | Discharge status: Home / Self Care | Payer: Self-pay | Source: Home / Self Care | Attending: Urology

## 2021-01-13 DIAGNOSIS — J45909 Unspecified asthma, uncomplicated: Secondary | ICD-10-CM | POA: Diagnosis not present

## 2021-01-13 DIAGNOSIS — N200 Calculus of kidney: Secondary | ICD-10-CM | POA: Diagnosis not present

## 2021-01-13 DIAGNOSIS — E119 Type 2 diabetes mellitus without complications: Secondary | ICD-10-CM | POA: Insufficient documentation

## 2021-01-13 DIAGNOSIS — Z87891 Personal history of nicotine dependence: Secondary | ICD-10-CM | POA: Diagnosis not present

## 2021-01-13 DIAGNOSIS — Z85528 Personal history of other malignant neoplasm of kidney: Secondary | ICD-10-CM | POA: Insufficient documentation

## 2021-01-13 DIAGNOSIS — Z87442 Personal history of urinary calculi: Secondary | ICD-10-CM | POA: Insufficient documentation

## 2021-01-13 DIAGNOSIS — I10 Essential (primary) hypertension: Secondary | ICD-10-CM | POA: Diagnosis not present

## 2021-01-13 DIAGNOSIS — E039 Hypothyroidism, unspecified: Secondary | ICD-10-CM | POA: Insufficient documentation

## 2021-01-13 DIAGNOSIS — Z905 Acquired absence of kidney: Secondary | ICD-10-CM | POA: Diagnosis not present

## 2021-01-13 HISTORY — PX: CYSTOSCOPY WITH RETROGRADE PYELOGRAM, URETEROSCOPY AND STENT PLACEMENT: SHX5789

## 2021-01-13 HISTORY — DX: Unspecified osteoarthritis, unspecified site: M19.90

## 2021-01-13 HISTORY — PX: HOLMIUM LASER APPLICATION: SHX5852

## 2021-01-13 LAB — POCT I-STAT, CHEM 8
BUN: 19 mg/dL (ref 8–23)
Calcium, Ion: 1.14 mmol/L — ABNORMAL LOW (ref 1.15–1.40)
Chloride: 107 mmol/L (ref 98–111)
Creatinine, Ser: 1.1 mg/dL — ABNORMAL HIGH (ref 0.44–1.00)
Glucose, Bld: 163 mg/dL — ABNORMAL HIGH (ref 70–99)
HCT: 37 % (ref 36.0–46.0)
Hemoglobin: 12.6 g/dL (ref 12.0–15.0)
Potassium: 3.3 mmol/L — ABNORMAL LOW (ref 3.5–5.1)
Sodium: 141 mmol/L (ref 135–145)
TCO2: 22 mmol/L (ref 22–32)

## 2021-01-13 LAB — GLUCOSE, CAPILLARY: Glucose-Capillary: 161 mg/dL — ABNORMAL HIGH (ref 70–99)

## 2021-01-13 SURGERY — CYSTOURETEROSCOPY, WITH RETROGRADE PYELOGRAM AND STENT INSERTION
Anesthesia: General | Site: Ureter | Laterality: Right

## 2021-01-13 MED ORDER — OXYCODONE HCL 5 MG/5ML PO SOLN: 5.0000 mg | Freq: Once | ORAL | Status: DC | PRN

## 2021-01-13 MED ORDER — SENNOSIDES-DOCUSATE SODIUM 8.6-50 MG PO TABS: 1.0000 | ORAL_TABLET | Freq: Two times a day (BID) | ORAL | 0 refills | Status: DC

## 2021-01-13 MED ORDER — FENTANYL CITRATE (PF) 100 MCG/2ML IJ SOLN
INTRAMUSCULAR | Status: AC
  Filled 2021-01-13: qty 2

## 2021-01-13 MED ORDER — OXYCODONE HCL 10 MG PO TABS
10.0000 mg | ORAL_TABLET | Freq: Four times a day (QID) | ORAL | 0 refills | Status: DC | PRN
Start: 2021-01-13 — End: 2021-01-27

## 2021-01-13 MED ORDER — LIDOCAINE 2% (20 MG/ML) 5 ML SYRINGE
INTRAMUSCULAR | Status: DC | PRN
  Administered 2021-01-13: 100 mg via INTRAVENOUS

## 2021-01-13 MED ORDER — ACETAMINOPHEN 10 MG/ML IV SOLN: 1000.0000 mg | Freq: Once | INTRAVENOUS | Status: DC | PRN

## 2021-01-13 MED ORDER — DEXAMETHASONE SODIUM PHOSPHATE 10 MG/ML IJ SOLN
INTRAMUSCULAR | Status: AC
  Filled 2021-01-13: qty 1

## 2021-01-13 MED ORDER — IOHEXOL 300 MG/ML  SOLN
INTRAMUSCULAR | Status: DC | PRN
  Administered 2021-01-13: 17 mL via URETHRAL

## 2021-01-13 MED ORDER — FENTANYL CITRATE (PF) 100 MCG/2ML IJ SOLN
INTRAMUSCULAR | Status: DC | PRN
  Administered 2021-01-13 (×4): 50 ug via INTRAVENOUS

## 2021-01-13 MED ORDER — ONDANSETRON HCL 4 MG/2ML IJ SOLN: 4.0000 mg | Freq: Once | INTRAMUSCULAR | Status: DC | PRN

## 2021-01-13 MED ORDER — PROPOFOL 10 MG/ML IV BOLUS
INTRAVENOUS | Status: DC | PRN
  Administered 2021-01-13: 200 mg via INTRAVENOUS
  Administered 2021-01-13: 100 mg via INTRAVENOUS

## 2021-01-13 MED ORDER — PROPOFOL 10 MG/ML IV BOLUS
INTRAVENOUS | Status: AC
  Filled 2021-01-13: qty 20

## 2021-01-13 MED ORDER — SODIUM CHLORIDE 0.9 % IR SOLN
Status: DC | PRN
  Administered 2021-01-13 (×2): 3000 mL

## 2021-01-13 MED ORDER — FENTANYL CITRATE (PF) 100 MCG/2ML IJ SOLN: 25.0000 ug | INTRAMUSCULAR | Status: DC | PRN

## 2021-01-13 MED ORDER — GENTAMICIN SULFATE 40 MG/ML IJ SOLN
5.0000 mg/kg | INTRAVENOUS | Status: AC
  Administered 2021-01-13: 385.2 mg via INTRAVENOUS
  Filled 2021-01-13: qty 9.75

## 2021-01-13 MED ORDER — 0.9 % SODIUM CHLORIDE (POUR BTL) OPTIME
TOPICAL | Status: DC | PRN
  Administered 2021-01-13: 500 mL

## 2021-01-13 MED ORDER — ONDANSETRON HCL 4 MG/2ML IJ SOLN
INTRAMUSCULAR | Status: AC
  Filled 2021-01-13: qty 2

## 2021-01-13 MED ORDER — SODIUM CHLORIDE 0.9 % IV SOLN: INTRAVENOUS | Status: DC

## 2021-01-13 MED ORDER — ONDANSETRON HCL 4 MG/2ML IJ SOLN
INTRAMUSCULAR | Status: DC | PRN
  Administered 2021-01-13: 4 mg via INTRAVENOUS

## 2021-01-13 MED ORDER — DEXAMETHASONE SODIUM PHOSPHATE 4 MG/ML IJ SOLN
INTRAMUSCULAR | Status: DC | PRN
  Administered 2021-01-13: 5 mg via INTRAVENOUS

## 2021-01-13 MED ORDER — OXYCODONE HCL 5 MG PO TABS: 5.0000 mg | ORAL_TABLET | Freq: Once | ORAL | Status: DC | PRN

## 2021-01-13 MED ORDER — LIDOCAINE 2% (20 MG/ML) 5 ML SYRINGE
INTRAMUSCULAR | Status: AC
  Filled 2021-01-13: qty 5

## 2021-01-13 SURGICAL SUPPLY — 26 items
BAG DRAIN URO-CYSTO SKYTR STRL (DRAIN) ×2 IMPLANT
BAG DRN UROCATH (DRAIN) ×1
BASKET LASER NITINOL 1.9FR (BASKET) ×1 IMPLANT
BSKT STON RTRVL 120 1.9FR (BASKET) ×1
CATH INTERMIT  6FR 70CM (CATHETERS) ×1 IMPLANT
CLOTH BEACON ORANGE TIMEOUT ST (SAFETY) ×2 IMPLANT
COVER DOME SNAP 22 D (MISCELLANEOUS) ×1 IMPLANT
GLOVE SURG ENC MOIS LTX SZ6 (GLOVE) ×1 IMPLANT
GLOVE SURG ENC MOIS LTX SZ7.5 (GLOVE) ×2 IMPLANT
GLOVE SURG POLYISO LF SZ7 (GLOVE) ×1 IMPLANT
GOWN STRL REUS W/TWL LRG LVL3 (GOWN DISPOSABLE) ×3 IMPLANT
GUIDEWIRE ANG ZIPWIRE 038X150 (WIRE) ×2 IMPLANT
GUIDEWIRE STR DUAL SENSOR (WIRE) ×2 IMPLANT
IV NS IRRIG 3000ML ARTHROMATIC (IV SOLUTION) ×3 IMPLANT
KIT TURNOVER CYSTO (KITS) ×2 IMPLANT
MANIFOLD NEPTUNE II (INSTRUMENTS) ×2 IMPLANT
NS IRRIG 500ML POUR BTL (IV SOLUTION) ×2 IMPLANT
PACK CYSTO (CUSTOM PROCEDURE TRAY) ×2 IMPLANT
SHEATH URETERAL 12FRX28CM (UROLOGICAL SUPPLIES) ×1 IMPLANT
STENT POLARIS 5FRX22 (STENTS) ×1 IMPLANT
SYR 10ML LL (SYRINGE) ×2 IMPLANT
TRACTIP FLEXIVA PULS ID 200XHI (Laser) IMPLANT
TRACTIP FLEXIVA PULSE ID 200 (Laser) ×2
TUBE CONNECTING 12X1/4 (SUCTIONS) ×2 IMPLANT
TUBE FEEDING 8FR 16IN STR KANG (MISCELLANEOUS) ×1 IMPLANT
TUBING UROLOGY SET (TUBING) ×2 IMPLANT

## 2021-01-13 NOTE — Brief Op Note (Signed)
01/13/2021  10:13 AM  PATIENT:  Savannah [name]  65 y.o. female  PRE-OPERATIVE DIAGNOSIS:  RIGHT RENAL STONE  POST-OPERATIVE DIAGNOSIS:  RIGHT RENAL STONE  PROCEDURE:  Procedure(s) with comments: FIRST STAGE CYSTOSCOPY WITH RETROGRADE PYELOGRAM, URETEROSCOPY AND STENT PLACEMENT (Right) - 90 MINS HOLMIUM LASER APPLICATION (Right)  SURGEON:  Surgeon(s) and Role:    * Alexis Frock, MD - Primary  PHYSICIAN ASSISTANT:   ASSISTANTS: none   ANESTHESIA:   general  EBL:  0 mL   BLOOD ADMINISTERED:none  DRAINS: none   LOCAL MEDICATIONS USED:  NONE  SPECIMEN:  Source of Specimen:  Rt renal stone fragments  DISPOSITION OF SPECIMEN:   Given to patient  COUNTS:  YES  TOURNIQUET:  * No tourniquets in log *  DICTATION: .Other Dictation: Dictation Number [id number]  PLAN OF CARE: Discharge to home after PACU  PATIENT DISPOSITION:  PACU - hemodynamically stable.   Delay start of Pharmacological VTE agent (>24hrs) due to surgical blood loss or risk of bleeding: not applicable

## 2021-01-13 NOTE — Discharge Instructions (Addendum)
1 - You may have urinary urgency (bladder spasms) and bloody urine on / off with stent in place. This is normal. ? ?2 - Call MD or go to ER for fever >102, severe pain / nausea / vomiting not relieved by medications, or acute change in medical status ? ? ?[facility] ?[phone] ?Post Ureteroscopy With or Without Stent Instructions ? ?Definitions: ? ?Ureter: The duct that transports urine from the kidney to the bladder. ?Stent:   A plastic hollow tube that is placed into the ureter, from the kidney to the bladder to prevent the ureter from swelling shut. ? ?GENERAL INSTRUCTIONS: ? ?Despite the fact that no skin incisions were used, the area around the ureter and bladder is raw and irritated. The stent is a foreign body which will further irritate the bladder wall. This irritation is manifested by increased frequency of urination, both day and night, and by an increase in the urge to urinate. In some, the urge to urinate is present almost always. Sometimes the urge is strong enough that you may not be able to stop yourself from urinating. The only real cure is to remove the stent and then give time for the bladder wall to heal which can't be done until the danger of the ureter swelling shut has passed, which varies. ? ?You may see some blood in your urine while the stent is in place and a few days afterwards. Do not be alarmed, even if the urine was clear for a while. Get off your feet and drink lots of fluids until clearing occurs. If you start to pass clots or don't improve, call us. ? ?DIET: ?You may return to your normal diet immediately. Because of the raw surface of your bladder, alcohol, spicy foods, acid type foods and drinks with caffeine may cause irritation or frequency and should be used in moderation. To keep your urine flowing freely and to avoid constipation, drink plenty of fluids during the day ( 8-10 glasses ). ?Tip: Avoid cranberry juice because it is very  acidic. ? ?ACTIVITY: ?Your physical activity doesn't need to be restricted. However, if you are very active, you may see some blood in your urine. We suggest that you reduce your activity under these circumstances until the bleeding has stopped. ? ?BOWELS: ?It is important to keep your bowels regular during the postoperative period. Straining with bowel movements can cause bleeding. A bowel movement every other day is reasonable. Use a mild laxative if needed, such as Milk of Magnesia 2-3 tablespoons, or 2 Dulcolax tablets. Call if you continue to have problems. If you have been taking narcotics for pain, before, during or after your surgery, you may be constipated. Take a laxative if necessary. ? ? ?MEDICATION: ?You should resume your pre-surgery medications unless told not to. In addition you will often be given an antibiotic to prevent infection. These should be taken as prescribed until the bottles are finished unless you are having an unusual reaction to one of the drugs. ? ?PROBLEMS YOU SHOULD REPORT TO US: ?Fevers over 100.5 Fahrenheit. ?Heavy bleeding, or clots ( See above notes about blood in urine ). ?Inability to urinate. ?Drug reactions ( hives, rash, nausea, vomiting, diarrhea ). ?Severe burning or pain with urination that is not improving. ? ?FOLLOW-UP: ?You will need a follow-up appointment to monitor your progress. Call for this appointment at the number listed above. Usually the first appointment will be about three to fourteen days after your surgery. ? ? ? ?  ?  Post Anesthesia Home Care Instructions ? ?Activity: ?Get plenty of rest for the remainder of the day. A responsible individual must stay with you for 24 hours following the procedure.  ?For the next 24 hours, DO NOT: ?-Drive a car ?-Operate machinery ?-Drink alcoholic beverages ?-Take any medication unless instructed by your physician ?-Make any legal decisions or sign important papers. ? ?Meals: ?Start with liquid foods such as gelatin or  soup. Progress to regular foods as tolerated. Avoid greasy, spicy, heavy foods. If nausea and/or vomiting occur, drink only clear liquids until the nausea and/or vomiting subsides. Call your physician if vomiting continues. ? ?Special Instructions/Symptoms: ?Your throat may feel dry or sore from the anesthesia or the breathing tube placed in your throat during surgery. If this causes discomfort, gargle with warm salt water. The discomfort should disappear within 24 hours. ? ? ?    ?

## 2021-01-13 NOTE — Anesthesia Preprocedure Evaluation (Signed)
Anesthesia Evaluation  Patient identified by MRN, date of birth, ID band Patient awake    Reviewed: Allergy & Precautions, H&P , NPO status , Patient's Chart, lab work & pertinent test results  Airway Mallampati: II  TM Distance: >3 FB Neck ROM: Full    Dental no notable dental hx.    Pulmonary asthma , sleep apnea , former smoker,    Pulmonary exam normal breath sounds clear to auscultation       Cardiovascular hypertension, Pt. on medications and Pt. on home beta blockers Normal cardiovascular exam Rhythm:Regular Rate:Normal     Neuro/Psych negative neurological ROS  negative psych ROS   GI/Hepatic Neg liver ROS, hiatal hernia,   Endo/Other  diabetes, Type 2Hypothyroidism   Renal/GU Renal InsufficiencyRenal disease  negative genitourinary   Musculoskeletal negative musculoskeletal ROS (+)   Abdominal   Peds negative pediatric ROS (+)  Hematology negative hematology ROS (+)   Anesthesia Other Findings   Reproductive/Obstetrics negative OB ROS                             Anesthesia Physical Anesthesia Plan  ASA: 3  Anesthesia Plan: General   Post-op Pain Management:    Induction: Intravenous  PONV Risk Score and Plan: 3 and Ondansetron, Dexamethasone and Treatment may vary due to age or medical condition  Airway Management Planned: Oral ETT  Additional Equipment:   Intra-op Plan:   Post-operative Plan: Extubation in OR  Informed Consent: I have reviewed the patients History and Physical, chart, labs and discussed the procedure including the risks, benefits and alternatives for the proposed anesthesia with the patient or authorized representative who has indicated his/her understanding and acceptance.     Dental advisory given  Plan Discussed with: CRNA and Surgeon  Anesthesia Plan Comments:         Anesthesia Quick Evaluation

## 2021-01-13 NOTE — Anesthesia Postprocedure Evaluation (Signed)
Anesthesia Post Note  Patient: VIOLA KINNICK  Procedure(s) Performed: FIRST STAGE CYSTOSCOPY WITH RETROGRADE PYELOGRAM, URETEROSCOPY AND STENT PLACEMENT (Right: Ureter) HOLMIUM LASER APPLICATION (Right: Ureter)     Patient location during evaluation: PACU Anesthesia Type: General Level of consciousness: awake and alert Pain management: pain level controlled Vital Signs Assessment: post-procedure vital signs reviewed and stable Respiratory status: spontaneous breathing, nonlabored ventilation, respiratory function stable and patient connected to nasal cannula oxygen Cardiovascular status: blood pressure returned to baseline and stable Postop Assessment: no apparent nausea or vomiting Anesthetic complications: no   No notable events documented.  Last Vitals:  Vitals:   01/13/21 1030 01/13/21 1045  BP: (!) 170/75 (!) 167/79  Pulse: 80 84  Resp: 16 19  Temp:    SpO2: 97% 94%    Last Pain:  Vitals:   01/13/21 1045  TempSrc:   PainSc: 0-No pain                 Erlinda Solinger S

## 2021-01-13 NOTE — Anesthesia Procedure Notes (Signed)
Procedure Name: LMA Insertion Date/Time: 01/13/2021 8:52 AM Performed by: Justice Rocher, CRNA Pre-anesthesia Checklist: Patient identified, Emergency Drugs available, Suction available, Patient being monitored and Timeout performed Patient Re-evaluated:Patient Re-evaluated prior to induction Oxygen Delivery Method: Circle system utilized Preoxygenation: Pre-oxygenation with 100% oxygen Induction Type: IV induction Ventilation: Mask ventilation without difficulty LMA: LMA inserted LMA Size: 4.0 Number of attempts: 1 Airway Equipment and Method: Bite block Placement Confirmation: positive ETCO2, breath sounds checked- equal and bilateral and CO2 detector Tube secured with: Tape Dental Injury: Teeth and Oropharynx as per pre-operative assessment

## 2021-01-13 NOTE — H&P (Signed)
Savannah [name] is an 65 y.o. female.    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    Post-op Surveillance:   - 11/2014 CT NED, Cr 1.2, stable Rt renal stone.  - 04/2016 CXR, [place], CMP NED, Cr 1.0 ([employer] denied CT despite peer to peer). Scattered Rt > Lt intrarenal stones w/o obstruction.  - 04/2017 CMP, CT no recurrence, Cr 1.0, About 2cm total volume Rt renal stones, no hydro, 500HU (doubled since 2016) ==> 2 stage Rt ureteroscopy to stone free.  -08/2018 - KUB, RUS - Left 21mm renal pelvis stone (by KUB, [place] does not corroborate), No Rt masses.  - 11/2020 - CT - 2.2 cm RLP partial staghorn stone, no hydro,    2 - Recurrent Nephrolithiasis - Several episodes small stone passage medically, Rt partial staghorn 2022 as per above.    PMH sig for DM2, ortho surgery x several, obesity. NO blood thinners. Her PCP is Cathlean Cower MD with [facility].    Today " Savannah [name] " is seen to proceed with RIGHT 1st stage ureteroscopic stone manipulation for enlarging stone. No interval fevers. Most recent UA without infectious parameters.    Past Medical History:  Diagnosis Date   Allergic rhinitis    Arthritis    arthritis in back   Asthma    B12 deficiency    Cancer ([place]) 2016   kidney    CKD (chronic kidney disease) 04/20/2020   stage 3 per OV note on 04/20/20 from Dr. 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),  clinically  localized w/ negative margins   Hypercholesterolemia    Hypertension    Hypothyroidism    endocrinologist -   dr ellision   Iron deficiency anemia    Lumbar spondylosis    NAFLD (nonalcoholic fatty liver disease)    dx 2010--- last hepatic panel in epic dated 04-17-2017   Nocturia    OA (osteoarthritis of spine)    Lumbar   OSA on CPAP pulmologist-  dr Halford Chessman   sleep study 09-11-2004  very severe osa , AHI 119/hr,  setting 10   Peripheral neuropathy    bottom of feet   Renal calculus, right    Sleep apnea    uses CPAP   Type 2 diabetes mellitus ([place])    endocrinoloigst-  dr Loanne Drilling,  last A1c 5.9 on 02-20-219 in epic   Vitamin B 12 deficiency    Wears glasses     Past Surgical History:  Procedure Laterality Date   BUNIONECTOMY/  HAMMERTOE CORRECTION'S  09-07-2013   SCG   RIGHT FOOT 2ND, 3RD, 4TH, 5TH DIGITS   CARDIAC CATHETERIZATION  06-01-2009  dr Angelena Form   no evidence CAD,  normal LVSF, elevated blood pressure w/ elevated end-diastolic pressure, ef 29-56%   CARDIOVASCULAR STRESS TEST  05-11-2009   dr Angelena Form   abnormal lexiscan nuclear study (no exercise) w/ mild ischemia in the distal anteroseptal wall and apex, this defect may be due to shifting breast attenuation  COLONOSCOPY  last 02/08/2014   COLONOSCOPY WITH SNARE POLYPECTOMY WITH ESOPHAGOGASTRODUODENOSCOPY  02/08/2014   with biopsy Dr. Jerilynn Mages. [facility] Plan   CYSTOSCOPY WITH RETROGRADE PYELOGRAM, URETEROSCOPY AND STENT PLACEMENT Right 06/07/2017   Procedure: CYSTOSCOPY WITH RETROGRADE PYELOGRAM, URETEROSCOPY, LITHOTRIPSY,  AND STENT PLACEMENT 1st STAGE;  Surgeon: Alexis Frock, MD;  Location: [facility];  Service: Urology;  Laterality: Right;   CYSTOSCOPY WITH RETROGRADE PYELOGRAM, URETEROSCOPY AND STENT PLACEMENT Right 06/21/2017   Procedure: CYSTOSCOPY WITH RETROGRADE PYELOGRAM, URETEROSCOPY AND STENT PLACEMENT 2ND STAGE  STENT" EXCHANGE";  Surgeon: Alexis Frock, MD;  Location: [facility];  Service: Urology;  Laterality: Right;   FRACTURE SURGERY Right    Compound fracture foot   FRACTURE SURGERY  Right 1963   Pt had surgery to repair broken arm.   HOLMIUM LASER APPLICATION Right 16/11/9602   Procedure: HOLMIUM LASER APPLICATION;  Surgeon: Alexis Frock, MD;  Location: [facility];  Service: Urology;  Laterality: Right;   HOLMIUM LASER APPLICATION Right 54/10/8117   Procedure: HOLMIUM LASER APPLICATION;  Surgeon: Alexis Frock, MD;  Location: [facility];  Service: Urology;  Laterality: Right;   KNEE ARTHROSCOPY Left 1993   POLYPECTOMY     ROBOTIC ASSITED PARTIAL NEPHRECTOMY Right 05/19/2014   Procedure: ROBOTIC ASSITED PARTIAL NEPHRECTOMY;  Surgeon: Alexis Frock, MD;  Location: [facility];  Service: Urology;  Laterality: Right;    Family History  Problem Relation Age of Onset   Colon cancer Mother 44   Diabetes Mother    Kidney disease Mother    Colon polyps Mother    Heart attack Father    Heart disease Father    Allergies Sister    Asthma Sister    Clotting disorder Grandchild    Heart disease Paternal Grandmother    Esophageal cancer Neg Hx    Rectal cancer Neg Hx    Stomach cancer Neg Hx    Social History:  reports that she quit smoking about 45 years ago. Her smoking use included cigarettes. She has a 0.60 pack-year smoking history. She has never used smokeless tobacco. She reports that she does not drink alcohol and does not use drugs.  Allergies:  Allergies  Allergen Reactions   Erythromycin Nausea And Vomiting    Medications Prior to Admission  Medication Sig Dispense Refill   albuterol (PROAIR HFA) 108 (90 Base) MCG/ACT inhaler Inhale 2 puffs into the lungs every 4 (four) hours as needed for wheezing or shortness of breath (cough). 3 each 3   atorvastatin (LIPITOR) 40 MG tablet TAKE 1 TABLET BY MOUTH EVERY DAY 90 tablet 1   cholecalciferol (VITAMIN D) 1000 units tablet Take 1,000 Units by mouth daily.     citalopram (CELEXA) 20 MG tablet TAKE 1 TABLET BY MOUTH EVERY DAY IN THE MORNING 90 tablet 2   collagenase (SANTYL)  ointment Apply 1 application topically daily.     FARXIGA 5 MG TABS tablet Take 5 mg by mouth daily.     fluticasone-salmeterol (ADVAIR) 100-50 MCG/ACT AEPB Inhale 1 puff into the lungs 2 (two) times daily. (Patient taking differently: Inhale 1 puff into the lungs daily.) 3 each 3   gabapentin (NEURONTIN) 300 MG capsule TAKE 1 CAPSULE BY MOUTH EVERYDAY AT BEDTIME 90 capsule 1   levothyroxine (SYNTHROID) 137 MCG tablet TAKE 1 TABLET BY MOUTH DAILY BEFORE BREAKFAST. 90 tablet 2   losartan (COZAAR) 100 MG tablet Take 100 mg by mouth daily.     metFORMIN (GLUCOPHAGE-XR) 500 MG 24 hr  tablet TAKE 2 TABLETS BY MOUTH TWICE A DAY 360 tablet 2   metoprolol succinate (TOPROL-XL) 25 MG 24 hr tablet TAKE 1/2 TABLET BY MOUTH EVERY DAY 45 tablet 1   RESTASIS 0.05 % ophthalmic emulsion Place 1 drop into both eyes 2 (two) times daily.     traZODone (DESYREL) 100 MG tablet TAKE 1 TABLET BY MOUTH EVERYDAY AT BEDTIME 90 tablet 1   hyoscyamine (LEVSIN SL) 0.125 MG SL tablet Take 1-2 tablets by mouth or under tongue every 4 hours as needed. 100 tablet 5   ondansetron (ZOFRAN-ODT) 4 MG disintegrating tablet Take 1 tablet (4 mg total) by mouth every 8 (eight) hours as needed for nausea or vomiting. 20 tablet 0   Oxycodone HCl 10 MG TABS Take 10 mg by mouth daily as needed (pain).   0    No results found for this or any previous visit (from the past 48 hour(s)). No results found.  Review of Systems  Constitutional:  Negative for chills and fever.  All other systems reviewed and are negative.  Weight 96.6 kg. Physical Exam Vitals reviewed.  HENT:     Head: Normocephalic.     Nose: Nose normal.  Eyes:     Pupils: Pupils are equal, round, and reactive to light.  Cardiovascular:     Rate and Rhythm: Normal rate.  Pulmonary:     Effort: Pulmonary effort is normal.  Abdominal:     General: Abdomen is flat.  Genitourinary:    Comments: NO CVAT at present Musculoskeletal:        General: Normal range of  motion.     Cervical back: Normal range of motion.  Skin:    General: Skin is warm.  Neurological:     General: No focal deficit present.     Mental Status: She is alert.  Psychiatric:        Mood and Affect: Mood normal.     Assessment/Plan  Proceed as planned with RIGHT 1st stage ureteroscopic stone manipulation. Risks, benefits, alternatives, expected peri-op course discssed previously an reiterated today.   Alexis Frock, MD 01/13/2021, 7:00 AM

## 2021-01-13 NOTE — Transfer of Care (Signed)
Immediate Anesthesia Transfer of Care Note  Patient: Savannah [name]  Procedure(s) Performed: Procedure(s) (LRB): FIRST STAGE CYSTOSCOPY WITH RETROGRADE PYELOGRAM, URETEROSCOPY AND STENT PLACEMENT (Right) HOLMIUM LASER APPLICATION (Right)  Patient Location: PACU  Anesthesia Type: General  Level of Consciousness: awake, sedated, patient cooperative and responds to stimulation  Airway & Oxygen Therapy: Patient Spontanous Breathing and Patient connected to [place] 02 and soft FM   Post-op Assessment: Report given to PACU RN, Post -op Vital signs reviewed and stable and Patient moving all extremities  Post vital signs: Reviewed and stable  Complications: No apparent anesthesia complications

## 2021-01-14 NOTE — Op Note (Signed)
NAME: Savannah [name], Savannah [name]. MEDICAL RECORD NO: [id number] ACCOUNT NO: [id number] DATE OF BIRTH: 1955/06/28 FACILITY: [facility] LOCATION: [facility] PHYSICIAN: Alexis Frock, MD  Operative Report   SURGEON:  Alexis Frock, MD  PREOPERATIVE DIAGNOSES:  Large right renal stone, history of right renal cancer.  PROCEDURE PERFORMED: 1.  Cystoscopy with right pyelogram interpretation. 2.  Right ureteroscopy with laser lithotripsy, first stage. 3.  Insertion of right ureteral stent 5 x 22 Polaris, no tether.  ESTIMATED BLOOD LOSS:  Nil.  COMPLICATIONS:  None.  SPECIMEN:  Right renal stone fragments given to the patient.  FINDINGS: 1.  Multifocal right renal stone, total volume approximately 2 cm. 2.  Successful ablation and removal of approximately 65% of stone material today. 3.  Successful placement of right ureteral stent, proximal end in renal pelvis, distal end in urinary bladder.  INDICATION:  The patient is a very pleasant 65 year old lady with remote history of right renal cell carcinoma, status post partial nephrectomy.  She has done well from an oncologic perspective. Also, has a history of recurrent urolithiasis.  She was  found on surveillance imaging of her renal tumor and kidney stones to have a significant volume of right renal stone recurrence at approximately 2 cm and an early partial staghorn configuration, not currently obstructing, but certainly concerning for  impending obstruction and ultimate renal demise.  Given her relatively young age and good functional status, it was felt that intervention was warranted to prevent stone growth to a level that would only be amenable to more invasive procedures and we  both agreed upon stage ureteroscopy with a goal of stone free.  Informed consent was obtained and placed in the medical record.  DESCRIPTION OF PROCEDURE:  The patient being verified and procedure being right first stage ureteroscopic stone manipulation was confirmed.   Procedure timeout was performed.  Intravenous antibiotics administered.  General LMA anesthesia was introduced.   The patient was placed in a low lithotomy position.  A sterile field was created.  We prepped and draped the patient's vagina, introitus and proximal thighs using iodine.  Cystourethroscopy was performed with a 21-French rigid cystoscope offset lens.   Inspection of the urinary bladder revealed no diverticulum, calcifications, papillary lesions.  The right ureteral orifice was cannulated with a 6-French end-hole catheter and right retrograde pyelogram was obtained.  Right retrograde pyelogram demonstrated a single right ureter, single system right kidney.  No hydronephrosis noted. 0.038 ZIPwire was advanced to the level of the upper pole set aside as a safety wire.  An 8-French feeding tube placed in the urinary  bladder and pressure released.  Semirigid ureteroscopy was performed of the distal four-fifths of the right ureter alongside a separate sensor working wire.  No mucosal abnormalities were found.  Next, the semirigid scope was exchanged for an 11/13 short  length ureteral access sheath to the level of the proximal ureter using continuous fluoroscopic guidance and flexible digital ureteroscopy was performed to the proximal right ureter and systematic inspection of the right kidney, including all calices  x3.  There were multifocal renal stones noted in all aspects of the kidney, Some of these were quite small, other of these were quite large and approximately 1 cm or so.  As such, holmium laser energy was applied to the larger stone using a setting of  0.2 joules and 20 Hz using dusting technique. All of the larger stones, which they were two were completely ablated into fragments less than ____ mm.  Next, using an escape  basket, sequential basketing technique was used and innumerable stone fragments  removed for approximately an hour.  Additional laser lithotripsy was performed using a  popcorn technique with settings of 1.8 joules and 10 Hz, fragmenting the remaining stone into essentially dust material. Estimated that we removed and ablated  approximately 65% or more of the stone material today.  However, given the large volume of stone, it was clearly felt that a second stage procedure will be warranted with a goal of stone free. At this portion, visualization was quite poor and it was felt  that this would be a good time to terminate the procedure today.  Access sheath was removed under continuous vision.  No significant mucosal abnormalities were found.  A new 5 x 22 Polaris stent was placed over the remaining safety wire using  fluoroscopic guidance.  Good proximal and distal planes were noted.  Procedure was terminated.  The patient tolerated the procedure well.  No immediate perioperative complications.  The patient taken to postanesthesia care unit in stable condition.  Plan for discharge home and second stage procedure in several weeks is planned.   NIK D: 01/13/2021 10:18:35 am T: 01/14/2021 1:17:00 am  JOB: [id number]/ [id number]

## 2021-01-16 ENCOUNTER — Encounter (HOSPITAL_BASED_OUTPATIENT_CLINIC_OR_DEPARTMENT_OTHER): Payer: Self-pay | Admitting: Urology

## 2021-01-27 ENCOUNTER — Ambulatory Visit (HOSPITAL_COMMUNITY): Payer: BC Managed Care – PPO | Admitting: Anesthesiology

## 2021-01-27 ENCOUNTER — Encounter (HOSPITAL_COMMUNITY): Payer: Self-pay | Admitting: Urology

## 2021-01-27 ENCOUNTER — Encounter (HOSPITAL_COMMUNITY)
Admission: RE | Disposition: A | Discharge status: Home / Self Care | Payer: Self-pay | Source: Home / Self Care | Attending: Urology

## 2021-01-27 ENCOUNTER — Ambulatory Visit (HOSPITAL_COMMUNITY): Payer: BC Managed Care – PPO

## 2021-01-27 ENCOUNTER — Ambulatory Visit (HOSPITAL_COMMUNITY)
Admission: RE | Admit: 2021-01-27 | Discharge: 2021-01-27 | Disposition: A | Discharge status: Home / Self Care | Payer: BC Managed Care – PPO | Attending: Urology | Admitting: Urology

## 2021-01-27 DIAGNOSIS — E039 Hypothyroidism, unspecified: Secondary | ICD-10-CM | POA: Diagnosis not present

## 2021-01-27 DIAGNOSIS — N183 Chronic kidney disease, stage 3 unspecified: Secondary | ICD-10-CM | POA: Diagnosis not present

## 2021-01-27 DIAGNOSIS — Z87442 Personal history of urinary calculi: Secondary | ICD-10-CM | POA: Diagnosis not present

## 2021-01-27 DIAGNOSIS — Z905 Acquired absence of kidney: Secondary | ICD-10-CM | POA: Diagnosis not present

## 2021-01-27 DIAGNOSIS — F32A Depression, unspecified: Secondary | ICD-10-CM | POA: Diagnosis not present

## 2021-01-27 DIAGNOSIS — Z87891 Personal history of nicotine dependence: Secondary | ICD-10-CM | POA: Diagnosis not present

## 2021-01-27 DIAGNOSIS — K449 Diaphragmatic hernia without obstruction or gangrene: Secondary | ICD-10-CM | POA: Diagnosis not present

## 2021-01-27 DIAGNOSIS — G4733 Obstructive sleep apnea (adult) (pediatric): Secondary | ICD-10-CM | POA: Diagnosis not present

## 2021-01-27 DIAGNOSIS — N200 Calculus of kidney: Secondary | ICD-10-CM | POA: Diagnosis present

## 2021-01-27 DIAGNOSIS — M1990 Unspecified osteoarthritis, unspecified site: Secondary | ICD-10-CM | POA: Insufficient documentation

## 2021-01-27 DIAGNOSIS — Z85528 Personal history of other malignant neoplasm of kidney: Secondary | ICD-10-CM | POA: Insufficient documentation

## 2021-01-27 DIAGNOSIS — I129 Hypertensive chronic kidney disease with stage 1 through stage 4 chronic kidney disease, or unspecified chronic kidney disease: Secondary | ICD-10-CM | POA: Insufficient documentation

## 2021-01-27 DIAGNOSIS — Z7984 Long term (current) use of oral hypoglycemic drugs: Secondary | ICD-10-CM | POA: Diagnosis not present

## 2021-01-27 DIAGNOSIS — J449 Chronic obstructive pulmonary disease, unspecified: Secondary | ICD-10-CM | POA: Diagnosis not present

## 2021-01-27 DIAGNOSIS — E1122 Type 2 diabetes mellitus with diabetic chronic kidney disease: Secondary | ICD-10-CM | POA: Insufficient documentation

## 2021-01-27 HISTORY — PX: HOLMIUM LASER APPLICATION: SHX5852

## 2021-01-27 HISTORY — PX: CYSTOSCOPY WITH RETROGRADE PYELOGRAM, URETEROSCOPY AND STENT PLACEMENT: SHX5789

## 2021-01-27 LAB — GLUCOSE, CAPILLARY
Glucose-Capillary: 153 mg/dL — ABNORMAL HIGH (ref 70–99)
Glucose-Capillary: 137 mg/dL — ABNORMAL HIGH (ref 70–99)

## 2021-01-27 SURGERY — CYSTOURETEROSCOPY, WITH RETROGRADE PYELOGRAM AND STENT INSERTION
Anesthesia: General | Site: Bladder | Laterality: Right

## 2021-01-27 MED ORDER — EPHEDRINE 5 MG/ML INJ
INTRAVENOUS | Status: AC
  Filled 2021-01-27: qty 5

## 2021-01-27 MED ORDER — OXYCODONE HCL 5 MG/5ML PO SOLN: 5.0000 mg | Freq: Once | ORAL | Status: DC | PRN

## 2021-01-27 MED ORDER — PROMETHAZINE HCL 25 MG/ML IJ SOLN: 6.2500 mg | INTRAMUSCULAR | Status: DC | PRN

## 2021-01-27 MED ORDER — ORAL CARE MOUTH RINSE: 15.0000 mL | Freq: Once | OROMUCOSAL | Status: AC

## 2021-01-27 MED ORDER — MIDAZOLAM HCL 2 MG/2ML IJ SOLN
INTRAMUSCULAR | Status: AC
  Filled 2021-01-27: qty 2

## 2021-01-27 MED ORDER — CEPHALEXIN 500 MG PO CAPS: 500.0000 mg | ORAL_CAPSULE | Freq: Two times a day (BID) | ORAL | 0 refills | Status: AC

## 2021-01-27 MED ORDER — MEPERIDINE HCL 50 MG/ML IJ SOLN: 6.2500 mg | INTRAMUSCULAR | Status: DC | PRN

## 2021-01-27 MED ORDER — GENTAMICIN SULFATE 40 MG/ML IJ SOLN
380.0000 mg | INTRAVENOUS | Status: AC
  Administered 2021-01-27: 380 mg via INTRAVENOUS
  Filled 2021-01-27: qty 9.5

## 2021-01-27 MED ORDER — DEXAMETHASONE SODIUM PHOSPHATE 4 MG/ML IJ SOLN
INTRAMUSCULAR | Status: DC | PRN
  Administered 2021-01-27: 4 mg via INTRAVENOUS

## 2021-01-27 MED ORDER — HYDROMORPHONE HCL 1 MG/ML IJ SOLN: 0.2500 mg | INTRAMUSCULAR | Status: DC | PRN

## 2021-01-27 MED ORDER — ONDANSETRON HCL 4 MG/2ML IJ SOLN
INTRAMUSCULAR | Status: AC
  Filled 2021-01-27: qty 2

## 2021-01-27 MED ORDER — PHENYLEPHRINE 40 MCG/ML (10ML) SYRINGE FOR IV PUSH (FOR BLOOD PRESSURE SUPPORT)
PREFILLED_SYRINGE | INTRAVENOUS | Status: AC
  Filled 2021-01-27: qty 10

## 2021-01-27 MED ORDER — MIDAZOLAM HCL 5 MG/5ML IJ SOLN
INTRAMUSCULAR | Status: DC | PRN
  Administered 2021-01-27: 2 mg via INTRAVENOUS

## 2021-01-27 MED ORDER — FENTANYL CITRATE (PF) 100 MCG/2ML IJ SOLN
INTRAMUSCULAR | Status: DC | PRN
  Administered 2021-01-27 (×2): 50 ug via INTRAVENOUS

## 2021-01-27 MED ORDER — PROPOFOL 10 MG/ML IV BOLUS
INTRAVENOUS | Status: DC | PRN
  Administered 2021-01-27: 200 mg via INTRAVENOUS

## 2021-01-27 MED ORDER — OXYCODONE HCL 10 MG PO TABS: 10.0000 mg | ORAL_TABLET | Freq: Four times a day (QID) | ORAL | 0 refills | Status: AC | PRN

## 2021-01-27 MED ORDER — SODIUM CHLORIDE 0.9 % IR SOLN
Status: DC | PRN
  Administered 2021-01-27: 3000 mL

## 2021-01-27 MED ORDER — LIDOCAINE HCL (PF) 2 % IJ SOLN
INTRAMUSCULAR | Status: AC
  Filled 2021-01-27: qty 5

## 2021-01-27 MED ORDER — ACETAMINOPHEN 500 MG PO TABS
1000.0000 mg | ORAL_TABLET | Freq: Once | ORAL | Status: AC
  Administered 2021-01-27: 1000 mg via ORAL
  Filled 2021-01-27: qty 2

## 2021-01-27 MED ORDER — PROPOFOL 10 MG/ML IV BOLUS
INTRAVENOUS | Status: AC
  Filled 2021-01-27: qty 20

## 2021-01-27 MED ORDER — LIDOCAINE HCL (CARDIAC) PF 100 MG/5ML IV SOSY
PREFILLED_SYRINGE | INTRAVENOUS | Status: DC | PRN
  Administered 2021-01-27: 60 mg via INTRAVENOUS

## 2021-01-27 MED ORDER — OXYCODONE HCL 5 MG PO TABS: 5.0000 mg | ORAL_TABLET | Freq: Once | ORAL | Status: DC | PRN

## 2021-01-27 MED ORDER — IOHEXOL 300 MG/ML  SOLN
INTRAMUSCULAR | Status: DC | PRN
  Administered 2021-01-27: 20 mL

## 2021-01-27 MED ORDER — FENTANYL CITRATE (PF) 100 MCG/2ML IJ SOLN
INTRAMUSCULAR | Status: AC
  Filled 2021-01-27: qty 2

## 2021-01-27 MED ORDER — PHENYLEPHRINE 40 MCG/ML (10ML) SYRINGE FOR IV PUSH (FOR BLOOD PRESSURE SUPPORT)
PREFILLED_SYRINGE | INTRAVENOUS | Status: DC | PRN
  Administered 2021-01-27 (×2): 80 ug via INTRAVENOUS

## 2021-01-27 MED ORDER — ONDANSETRON HCL 4 MG/2ML IJ SOLN
INTRAMUSCULAR | Status: DC | PRN
  Administered 2021-01-27: 4 mg via INTRAVENOUS

## 2021-01-27 MED ORDER — CHLORHEXIDINE GLUCONATE 0.12 % MT SOLN
15.0000 mL | Freq: Once | OROMUCOSAL | Status: AC
  Administered 2021-01-27: 15 mL via OROMUCOSAL

## 2021-01-27 MED ORDER — MIDAZOLAM HCL 2 MG/2ML IJ SOLN: 0.5000 mg | Freq: Once | INTRAMUSCULAR | Status: DC | PRN

## 2021-01-27 MED ORDER — EPHEDRINE SULFATE-NACL 50-0.9 MG/10ML-% IV SOSY
PREFILLED_SYRINGE | INTRAVENOUS | Status: DC | PRN
  Administered 2021-01-27 (×2): 10 mg via INTRAVENOUS

## 2021-01-27 MED ORDER — LACTATED RINGERS IV SOLN: INTRAVENOUS | Status: DC

## 2021-01-27 MED ORDER — DEXAMETHASONE SODIUM PHOSPHATE 10 MG/ML IJ SOLN
INTRAMUSCULAR | Status: AC
  Filled 2021-01-27: qty 1

## 2021-01-27 SURGICAL SUPPLY — 23 items
BAG URO CATCHER STRL LF (MISCELLANEOUS) ×2 IMPLANT
BASKET LASER NITINOL 1.9FR (BASKET) IMPLANT
BASKET STONE NCOMPASS (UROLOGICAL SUPPLIES) ×1 IMPLANT
BSKT STON RTRVL 120 1.9FR (BASKET)
CATH URETL OPEN END 6FR 70 (CATHETERS) ×2 IMPLANT
CLOTH BEACON ORANGE TIMEOUT ST (SAFETY) ×2 IMPLANT
EXTRACTOR STONE 1.7FRX115CM (UROLOGICAL SUPPLIES) IMPLANT
GLOVE SURG ENC TEXT LTX SZ7.5 (GLOVE) ×2 IMPLANT
GOWN STRL REUS W/TWL LRG LVL3 (GOWN DISPOSABLE) ×2 IMPLANT
GUIDEWIRE ANG ZIPWIRE 038X150 (WIRE) ×2 IMPLANT
GUIDEWIRE STR DUAL SENSOR (WIRE) ×2 IMPLANT
KIT TURNOVER KIT A (KITS) IMPLANT
LASER FIB FLEXIVA PULSE ID 365 (Laser) IMPLANT
MANIFOLD NEPTUNE II (INSTRUMENTS) ×2 IMPLANT
PACK CYSTO (CUSTOM PROCEDURE TRAY) ×2 IMPLANT
SHEATH NAVIGATOR HD 11/13X28 (SHEATH) ×1 IMPLANT
SHEATH NAVIGATOR HD 11/13X36 (SHEATH) IMPLANT
STENT URET 6FRX22 CONTOUR (STENTS) ×1 IMPLANT
TRACTIP FLEXIVA PULS ID 200XHI (Laser) IMPLANT
TRACTIP FLEXIVA PULSE ID 200 (Laser) ×2
TUBE FEEDING 8FR 16IN STR KANG (MISCELLANEOUS) ×2 IMPLANT
TUBING CONNECTING 10 (TUBING) ×2 IMPLANT
TUBING UROLOGY SET (TUBING) ×2 IMPLANT

## 2021-01-27 NOTE — Anesthesia Procedure Notes (Signed)
Procedure Name: LMA Insertion Date/Time: 01/27/2021 11:16 AM Performed by: Lieutenant Diego, CRNA Pre-anesthesia Checklist: Patient identified, Emergency Drugs available, Suction available and Patient being monitored Patient Re-evaluated:Patient Re-evaluated prior to induction Oxygen Delivery Method: Circle system utilized Preoxygenation: Pre-oxygenation with 100% oxygen Induction Type: IV induction Ventilation: Mask ventilation without difficulty LMA: LMA inserted LMA Size: 4.0 Number of attempts: 1 Placement Confirmation: positive ETCO2 and breath sounds checked- equal and bilateral Tube secured with: Tape Dental Injury: Teeth and Oropharynx as per pre-operative assessment

## 2021-01-27 NOTE — Transfer of Care (Signed)
Immediate Anesthesia Transfer of Care Note  Patient: Savannah [name]  Procedure(s) Performed: SECOND STAGE CYSTOSCOPY WITH RETROGRADE PYELOGRAM, URETEROSCOPY AND STENT EXCHANGE (Right: Bladder) HOLMIUM LASER APPLICATION (Right: Bladder)  Patient Location: PACU  Anesthesia Type:General  Level of Consciousness: awake  Airway & Oxygen Therapy: Patient Spontanous Breathing and Patient connected to face mask oxygen  Post-op Assessment: Report given to RN and Post -op Vital signs reviewed and stable  Post vital signs: Reviewed and stable  Last Vitals:  Vitals Value Taken Time  BP 166/74 01/27/21 1208  Temp    Pulse 91 01/27/21 1210  Resp 19 01/27/21 1210  SpO2 97 % 01/27/21 1210  Vitals shown include unvalidated device data.  Last Pain:  Vitals:   01/27/21 0927  TempSrc:   PainSc: 0-No pain      Patients Stated Pain Goal: 5 (19/50/93 2671)  Complications: No notable events documented.

## 2021-01-27 NOTE — Anesthesia Postprocedure Evaluation (Signed)
Anesthesia Post Note  Patient: VARSHA KNOCK  Procedure(s) Performed: SECOND STAGE CYSTOSCOPY WITH RETROGRADE PYELOGRAM, URETEROSCOPY AND STENT EXCHANGE (Right: Bladder) HOLMIUM LASER APPLICATION (Right: Bladder)     Patient location during evaluation: PACU Anesthesia Type: General Level of consciousness: awake and alert, patient cooperative and oriented Pain management: pain level controlled Vital Signs Assessment: post-procedure vital signs reviewed and stable Respiratory status: spontaneous breathing, nonlabored ventilation and respiratory function stable Cardiovascular status: blood pressure returned to baseline and stable Postop Assessment: no apparent nausea or vomiting and able to ambulate Anesthetic complications: no   No notable events documented.  Last Vitals:  Vitals:   01/27/21 1308 01/27/21 1323  BP: (!) 159/72 (!) 162/88  Pulse: 77 63  Resp: 16 20  Temp: 36.5 C 36.5 C  SpO2: 95% 95%    Last Pain:  Vitals:   01/27/21 1323  TempSrc:   PainSc: 0-No pain                 Vibhav Waddill,E. Macall Mccroskey

## 2021-01-27 NOTE — Anesthesia Preprocedure Evaluation (Signed)
Anesthesia Evaluation  Patient identified by MRN, date of birth, ID band Patient awake    Reviewed: Allergy & Precautions, NPO status , Patient's Chart, lab work & pertinent test results  History of Anesthesia Complications Negative for: history of anesthetic complications  Airway Mallampati: II  TM Distance: >3 FB Neck ROM: Full    Dental  (+) Dental Advisory Given   Pulmonary sleep apnea and Continuous Positive Airway Pressure Ventilation , COPD,  COPD inhaler, former smoker,    breath sounds clear to auscultation       Cardiovascular hypertension, Pt. on medications  Rhythm:Regular Rate:Normal     Neuro/Psych Depression negative neurological ROS     GI/Hepatic Neg liver ROS, hiatal hernia, neg GERD  ,  Endo/Other  diabetes (glu 153), Oral Hypoglycemic AgentsHypothyroidism obese  Renal/GU stones     Musculoskeletal  (+) Arthritis ,   Abdominal (+) + obese,   Peds  Hematology negative hematology ROS (+)   Anesthesia Other Findings   Reproductive/Obstetrics                             Anesthesia Physical Anesthesia Plan  ASA: 3  Anesthesia Plan: General   Post-op Pain Management: Dilaudid IV and Tylenol PO (pre-op)   Induction: Intravenous  PONV Risk Score and Plan: 3 and Ondansetron, Dexamethasone and Treatment may vary due to age or medical condition  Airway Management Planned: LMA  Additional Equipment: None  Intra-op Plan:   Post-operative Plan:   Informed Consent: I have reviewed the patients History and Physical, chart, labs and discussed the procedure including the risks, benefits and alternatives for the proposed anesthesia with the patient or authorized representative who has indicated his/her understanding and acceptance.     Dental advisory given  Plan Discussed with: CRNA and Surgeon  Anesthesia Plan Comments:         Anesthesia Quick Evaluation

## 2021-01-27 NOTE — Brief Op Note (Signed)
01/27/2021  12:02 PM  PATIENT:  Savannah [name]  65 y.o. female  PRE-OPERATIVE DIAGNOSIS:  RIGHT RENAL STONE  POST-OPERATIVE DIAGNOSIS:  RIGHT RENAL STONE  PROCEDURE:  Procedure(s) with comments: SECOND STAGE CYSTOSCOPY WITH RETROGRADE PYELOGRAM, URETEROSCOPY AND STENT EXCHANGE (Right) - 90 MINS HOLMIUM LASER APPLICATION (Right)  SURGEON:  Surgeon(s) and Role:    * Alexis Frock, MD - Primary  PHYSICIAN ASSISTANT:   ASSISTANTS: none   ANESTHESIA:   general  EBL:  minimal   BLOOD ADMINISTERED:none  DRAINS: none   LOCAL MEDICATIONS USED:  NONE  SPECIMEN:  Source of Specimen:  Rt renal stone fragments  DISPOSITION OF SPECIMEN:   discard  COUNTS:  YES  TOURNIQUET:  * No tourniquets in log *  DICTATION: .Other Dictation: Dictation Number [id number]  PLAN OF CARE: Discharge to home after PACU  PATIENT DISPOSITION:  PACU - hemodynamically stable.   Delay start of Pharmacological VTE agent (>24hrs) due to surgical blood loss or risk of bleeding: yes

## 2021-01-27 NOTE — Discharge Instructions (Addendum)
1 - You may have urinary urgency (bladder spasms) and bloody urine on / off with stent in place. This is normal. ° °2 - Remove tethered stent on Monday morning at home by pulling on string, then blue-white plastic tubing, and discarding. Office is open Monday if any problems arise.  ° °3 - Call MD or go to ER for fever >102, severe pain / nausea / vomiting not relieved by medications, or acute change in medical status ° °

## 2021-01-27 NOTE — H&P (Signed)
Savannah [name] is an 65 y.o. female.    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    Post-op Surveillance:   - 11/2014 CT NED, Cr 1.2, stable Rt renal stone.  - 04/2016 CXR, [place], CMP NED, Cr 1.0 ([employer] denied CT despite peer to peer). Scattered Rt > Lt intrarenal stones w/o obstruction.  - 04/2017 CMP, CT no recurrence, Cr 1.0, About 2cm total volume Rt renal stones, no hydro, 500HU (doubled since 2016) ==> 2 stage Rt ureteroscopy to stone free.  -08/2018 - KUB, RUS - Left 61mm renal pelvis stone (by KUB, [place] does not corroborate), No Rt masses.  - 11/2020 - CT - 2.2 cm RLP partial staghorn stone, no hydro,    2 - Recurrent Nephrolithiasis - Several episodes small stone passage medically, Rt partial staghorn 2022 as per above. 1st stage ureteroscopy on 01/13/21 and 5x22polaris stent placed.    PMH sig for DM2, ortho surgery x several, obesity. NO blood thinners. Her PCP is Cathlean Cower MD with [facility].    Today " Savannah [name] " is seen to proceed with RIGHT 2nd stage ureteroscopic stone manipulation for enlarging stone. No interval fevers. Some irritative voiding with stent as expected.     Past Medical History:  Diagnosis Date   Allergic rhinitis    Arthritis    arthritis in back   Asthma    B12 deficiency    Cancer ([place]) 2016   kidney    CKD (chronic kidney disease) 04/20/2020   stage 3 per OV note on 04/20/20 from Dr. 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),  clinically  localized w/ negative margins    Hypercholesterolemia    Hypertension    Hypothyroidism    endocrinologist -  dr ellision   Iron deficiency anemia    Lumbar spondylosis    NAFLD (nonalcoholic fatty liver disease)    dx 2010--- last hepatic panel in epic dated 04-17-2017   Nocturia    OA (osteoarthritis of spine)    Lumbar   OSA on CPAP pulmologist-  dr Halford Chessman   sleep study 09-11-2004  very severe osa , AHI 119/hr,  setting 10   Peripheral neuropathy    bottom of feet   Renal calculus, right    Sleep apnea    uses CPAP   Type 2 diabetes mellitus ([place])    endocrinoloigst-  dr Loanne Drilling,  last A1c 5.9 on 02-20-219 in epic   Vitamin B 12 deficiency    Wears glasses     Past Surgical History:  Procedure Laterality Date   BUNIONECTOMY/  HAMMERTOE CORRECTION'S  09-07-2013   SCG   RIGHT FOOT 2ND, 3RD, 4TH, 5TH DIGITS   CARDIAC CATHETERIZATION  06-01-2009  dr Angelena Form   no evidence CAD,  normal LVSF, elevated blood pressure w/ elevated end-diastolic pressure, ef 21-30%   CARDIOVASCULAR STRESS TEST  05-11-2009   dr Angelena Form   abnormal lexiscan nuclear study (no exercise) w/ mild ischemia in the distal anteroseptal wall and  apex, this defect may be due to shifting breast attenuation   COLONOSCOPY  last 02/08/2014   COLONOSCOPY WITH SNARE POLYPECTOMY WITH ESOPHAGOGASTRODUODENOSCOPY  02/08/2014   with biopsy Dr. Jerilynn Mages. [facility] Plan   CYSTOSCOPY WITH RETROGRADE PYELOGRAM, URETEROSCOPY AND STENT PLACEMENT Right 06/07/2017   Procedure: CYSTOSCOPY WITH RETROGRADE PYELOGRAM, URETEROSCOPY, LITHOTRIPSY,  AND STENT PLACEMENT 1st STAGE;  Surgeon: Alexis Frock, MD;  Location: [facility];  Service: Urology;  Laterality: Right;   CYSTOSCOPY WITH RETROGRADE PYELOGRAM, URETEROSCOPY AND STENT PLACEMENT Right 06/21/2017   Procedure: CYSTOSCOPY WITH RETROGRADE PYELOGRAM, URETEROSCOPY AND STENT PLACEMENT 2ND STAGE  STENT" EXCHANGE";  Surgeon: Alexis Frock, MD;  Location: [facility];  Service: Urology;  Laterality:  Right;   CYSTOSCOPY WITH RETROGRADE PYELOGRAM, URETEROSCOPY AND STENT PLACEMENT Right 01/13/2021   Procedure: FIRST STAGE CYSTOSCOPY WITH RETROGRADE PYELOGRAM, URETEROSCOPY AND STENT PLACEMENT;  Surgeon: Alexis Frock, MD;  Location: [facility];  Service: Urology;  Laterality: Right;  90 MINS   FRACTURE SURGERY Right    Compound fracture foot   FRACTURE SURGERY Right 1963   Pt had surgery to repair broken arm.   HOLMIUM LASER APPLICATION Right 95/63/8756   Procedure: HOLMIUM LASER APPLICATION;  Surgeon: Alexis Frock, MD;  Location: [facility];  Service: Urology;  Laterality: Right;   HOLMIUM LASER APPLICATION Right 43/32/9518   Procedure: HOLMIUM LASER APPLICATION;  Surgeon: Alexis Frock, MD;  Location: [facility];  Service: Urology;  Laterality: Right;   HOLMIUM LASER APPLICATION Right 84/16/6063   Procedure: HOLMIUM LASER APPLICATION;  Surgeon: Alexis Frock, MD;  Location: [facility];  Service: Urology;  Laterality: Right;   KNEE ARTHROSCOPY Left 1993   POLYPECTOMY     ROBOTIC ASSITED PARTIAL NEPHRECTOMY Right 05/19/2014   Procedure: ROBOTIC ASSITED PARTIAL NEPHRECTOMY;  Surgeon: Alexis Frock, MD;  Location: [facility];  Service: Urology;  Laterality: Right;    Family History  Problem Relation Age of Onset   Colon cancer Mother 18   Diabetes Mother    Kidney disease Mother    Colon polyps Mother    Heart attack Father    Heart disease Father    Allergies Sister    Asthma Sister    Clotting disorder Grandchild    Heart disease Paternal Grandmother    Esophageal cancer Neg Hx    Rectal cancer Neg Hx    Stomach cancer Neg Hx    Social History:  reports that she quit smoking about 45 years ago. Her smoking use included cigarettes. She has a 0.60 pack-year smoking history. She has never used smokeless tobacco. She reports that she does not drink alcohol and does not use drugs.  Allergies:  Allergies  Allergen  Reactions   Erythromycin Nausea And Vomiting    No medications prior to admission.    No results found for this or any previous visit (from the past 48 hour(s)). No results found.  Review of Systems  Constitutional:  Negative for chills and fever.  Genitourinary:  Positive for urgency.  All other systems reviewed and are negative.  There were no vitals taken for this visit. Physical Exam Vitals reviewed.  HENT:     Head: Normocephalic.  Eyes:     Pupils: Pupils are equal, round, and reactive to light.  Cardiovascular:     Rate and Rhythm: Normal rate.  Pulmonary:     Effort: Pulmonary effort is normal.  Abdominal:     General: Abdomen is flat.  Comments: Stable truncal obesity.   Genitourinary:    Comments: No CVAT at present.  Musculoskeletal:        General: Normal range of motion.     Cervical back: Normal range of motion.  Neurological:     General: No focal deficit present.  Psychiatric:        Mood and Affect: Mood normal.     Assessment/Plan  Proceed as planned with RIGHT 2nd stage uteroscopic stone manipulation. Risks, benefits, alternatives, expected peri-op course including need for stent exchange given stone size discussed previously and reiterated today.   Alexis Frock, MD 01/27/2021, 7:15 AM

## 2021-01-28 ENCOUNTER — Encounter (HOSPITAL_COMMUNITY): Payer: Self-pay | Admitting: Urology

## 2021-01-28 NOTE — Op Note (Signed)
NAME: SEASON, ASTACIO. MEDICAL RECORD NO: [id number] ACCOUNT NO: [id number] DATE OF BIRTH: 26-Mar-1955 FACILITY: [facility] LOCATION: [facility] PHYSICIAN: Alexis Frock, MD  Operative Report   DATE OF PROCEDURE: 01/27/2021  PREOPERATIVE DIAGNOSIS:  Large right renal stone.  PROCEDURE PERFORMED:  1. Cystoscopy right retrograde pyelogram and interpretation. 2.  Right ureteroscopy with laser lithotripsy, second stage. 3.  Exchange of right ureteral stent, 5 x 22 Polaris with tether.  ESTIMATED BLOOD LOSS: Nil.  COMPLICATIONS:  None.  SPECIMEN:  Right renal stone fragments were discarded.  FINDINGS:   1.  Dramatic resolution of residual stone fragments.  Estimated residual volume approximately 1 cm2. 2.  Complete removal of accessible stone fragments larger than 130 mm following laser lithotripsy and extraction. 3.  Successful replacement, right ureteral stent, proximal and renal pelvis, distally in urinary bladder with tether.  INDICATIONS FOR PROCEDURE:  The patient is a very pleasant 65 year old lady with remote history of renal cancer, status post partial nephrectomy as well as recurrent urolithiasis.  She was found on surveillance imaging to have a significant volume of  stone recurrence in her right kidney, at approximately 2 cm in an early partial staghorn configuration, although not yet obstructing.  This is certainly concerning for eventual obstruction and renal demise.  Options were discussed including continued  surveillance versus shockwave lithotripsy versus ureteroscopy versus percutaneous stone surgery.  We agreed that a staged ureteroscopy would be most definitive and the least invasive.  Informed consent was obtained and placed in medical record.  DESCRIPTION OF PROCEDURE:  The patient being Beyonka Pitney, was verified and the procedure being a right second stage ureteroscopic stone manipulation was confirmed.  Procedure timeout was carried out. Intravenous antibiotics were  administered.  General  anesthesia induced.  The patient was placed into the low lithotomy position.  Sterile field was created by prepping and draping the patient's vagina, introitus and proximal thigh using iodine. Cystourethroscopy was performed using 21-French rigid  cystoscope with offset lens.  Inspection of the bladder revealed distal end of right ureteral stent in situ and it was grasped, brought to the level of the urethral meatus.  And a 0.038 ZIPwire was passed in the lower pole.  The stent was exchanged for  an open-ended catheter and right retrograde pyelogram was obtained.  Right retrograde pyelogram demonstrated a single right ureter, single system right kidney.  No significant filling defects noted.  No extravasation noted.  A ZIPwire was once again advanced and set aside as a safety wire.  An 8-French feeding tube placed  in the urinary bladder  for pressure release.  Semi-rigid ureteroscopy was performed of distal four fifths of the right ureter alongside a separate sensor working wire.  No significant mucosal abnormalities were found or large stones in the ureter.   Semirigid scope was then exchanged for an 11/13 short length ureteral access sheath to the level of the proximal ureter.  Using continuous fluoroscopic guidance, a flexible digital ureteroscopy was then performed of the proximal right ureter.  A  systematic inspection of the right kidney, including all calices x 3.  There have been dramatic resolution of residual stone fragments with interval stenting.  There were 2 foci of stone material both in the upper pole, most of which was just stone debris  from prior staged procedure.  Estimated total residual volume approximately 1 cm.  Some of these fragments were 2-3 mm and the majority of which were quite smaller.  As such, the NCompass basket was used and  the dominant fragments were carefully removed  using a sequential basketing techniques such that all fragments remaining were  clearly less than 130 mm.  Next, holmium laser energy was applied to the remaining stone burden using settings of 1 joule and 10 Hz using a popcorn technique, the residual stone  fragments were dusted and were essentially imperceptible size fragments.  I was quite pleased with the efficacy of the procedure today and given the minimal amount of stone volume at the conclusion, it was felt that only interval stenting with tether  stenting would be most advantageous.  As such, the access sheath was removed under continuous vision.  No significant mucosal abnormalities were found and a new 5 x 22 Polaris type stent was placed over the safety wire using fluoroscopic guidance.  Good  proximal and distal planes were noted.  A tether was left in place, fashioned, rather trimmed to length and tucked per vagina.  The procedure was terminated.  The patient tolerated procedure well.  No immediate periprocedural complications.  Patient was  taken to postanesthesia care in stable condition.  PLAN: Discharge home and removal of stent after two to three days.   MUK D: 01/27/2021 12:08:41 pm T: 01/28/2021 1:00:00 am  JOB: [id number]/ [id number]

## 2021-02-16 ENCOUNTER — Other Ambulatory Visit: Payer: Self-pay | Admitting: Internal Medicine

## 2021-04-20 ENCOUNTER — Ambulatory Visit (INDEPENDENT_AMBULATORY_CARE_PROVIDER_SITE_OTHER): Payer: BC Managed Care – PPO

## 2021-04-20 ENCOUNTER — Encounter: Payer: Self-pay | Admitting: Internal Medicine

## 2021-04-20 ENCOUNTER — Ambulatory Visit (INDEPENDENT_AMBULATORY_CARE_PROVIDER_SITE_OTHER): Payer: BC Managed Care – PPO | Admitting: Internal Medicine

## 2021-04-20 ENCOUNTER — Other Ambulatory Visit: Payer: Self-pay

## 2021-04-20 VITALS — BP 120/60 | HR 80 | Temp 98.8°F | Resp 16 | Ht 68.0 in | Wt 217.6 lb

## 2021-04-20 VITALS — BP 120/60 | HR 80 | Temp 98.8°F | Ht 68.0 in | Wt 217.6 lb

## 2021-04-20 DIAGNOSIS — H6122 Impacted cerumen, left ear: Secondary | ICD-10-CM | POA: Diagnosis not present

## 2021-04-20 DIAGNOSIS — E559 Vitamin D deficiency, unspecified: Secondary | ICD-10-CM

## 2021-04-20 DIAGNOSIS — E1165 Type 2 diabetes mellitus with hyperglycemia: Secondary | ICD-10-CM

## 2021-04-20 DIAGNOSIS — E538 Deficiency of other specified B group vitamins: Secondary | ICD-10-CM | POA: Diagnosis not present

## 2021-04-20 DIAGNOSIS — Z0000 Encounter for general adult medical examination without abnormal findings: Secondary | ICD-10-CM | POA: Diagnosis not present

## 2021-04-20 DIAGNOSIS — E039 Hypothyroidism, unspecified: Secondary | ICD-10-CM

## 2021-04-20 DIAGNOSIS — R519 Headache, unspecified: Secondary | ICD-10-CM

## 2021-04-20 DIAGNOSIS — F5101 Primary insomnia: Secondary | ICD-10-CM | POA: Diagnosis not present

## 2021-04-20 LAB — LIPID PANEL
Cholesterol: 139 mg/dL (ref 0–200)
HDL: 56.1 mg/dL (ref >39.00)
NonHDL: 82.89
Total CHOL/HDL Ratio: 2
Triglycerides: 202 mg/dL — ABNORMAL HIGH (ref 0.0–149.0)
VLDL: 40.4 mg/dL — ABNORMAL HIGH (ref 0.0–40.0)

## 2021-04-20 LAB — CBC WITH DIFFERENTIAL/PLATELET
Basophils Absolute: 0.1 10*3/uL (ref 0.0–0.1)
Basophils Relative: 0.6 % (ref 0.0–3.0)
Eosinophils Absolute: 0.3 10*3/uL (ref 0.0–0.7)
Eosinophils Relative: 4.1 % (ref 0.0–5.0)
HCT: 40.9 % (ref 36.0–46.0)
Hemoglobin: 13.7 g/dL (ref 12.0–15.0)
Lymphocytes Relative: 29.2 % (ref 12.0–46.0)
Lymphs Abs: 2.3 10*3/uL (ref 0.7–4.0)
MCHC: 33.6 g/dL (ref 30.0–36.0)
MCV: 87.9 fl (ref 78.0–100.0)
Monocytes Absolute: 0.4 10*3/uL (ref 0.1–1.0)
Monocytes Relative: 5.1 % (ref 3.0–12.0)
Neutro Abs: 4.8 10*3/uL (ref 1.4–7.7)
Neutrophils Relative %: 61 % (ref 43.0–77.0)
Platelets: 192 10*3/uL (ref 150.0–400.0)
RBC: 4.65 Mil/uL (ref 3.87–5.11)
RDW: 14.4 % (ref 11.5–15.5)
WBC: 7.9 10*3/uL (ref 4.0–10.5)

## 2021-04-20 LAB — URINALYSIS, ROUTINE W REFLEX MICROSCOPIC
Bilirubin Urine: NEGATIVE
Hgb urine dipstick: NEGATIVE
Ketones, ur: NEGATIVE
Nitrite: NEGATIVE
RBC / HPF: NONE SEEN (ref 0–?)
Specific Gravity, Urine: 1.02 (ref 1.000–1.030)
Total Protein, Urine: NEGATIVE
Urine Glucose: >=1000 — AB
Urobilinogen, UA: 0.2 (ref 0.0–1.0)
pH: 5.5 (ref 5.0–8.0)

## 2021-04-20 LAB — BASIC METABOLIC PANEL
BUN: 23 mg/dL (ref 6–23)
CO2: 26 mEq/L (ref 19–32)
Calcium: 9.7 mg/dL (ref 8.4–10.5)
Chloride: 105 mEq/L (ref 96–112)
Creatinine, Ser: 1.21 mg/dL — ABNORMAL HIGH (ref 0.40–1.20)
GFR: 46.65 mL/min — ABNORMAL LOW (ref >60.00)
Glucose, Bld: 119 mg/dL — ABNORMAL HIGH (ref 70–99)
Potassium: 4.2 mEq/L (ref 3.5–5.1)
Sodium: 141 mEq/L (ref 135–145)

## 2021-04-20 LAB — HEPATIC FUNCTION PANEL
ALT: 21 U/L (ref 0–35)
AST: 19 U/L (ref 0–37)
Albumin: 4.5 g/dL (ref 3.5–5.2)
Alkaline Phosphatase: 67 U/L (ref 39–117)
Bilirubin, Direct: 0.3 mg/dL (ref 0.0–0.3)
Total Bilirubin: 1.7 mg/dL — ABNORMAL HIGH (ref 0.2–1.2)
Total Protein: 7.3 g/dL (ref 6.0–8.3)

## 2021-04-20 LAB — MICROALBUMIN / CREATININE URINE RATIO
Creatinine,U: 80.7 mg/dL
Microalb Creat Ratio: 1.7 mg/g (ref 0.0–30.0)
Microalb, Ur: 1.4 mg/dL (ref 0.0–1.9)

## 2021-04-20 LAB — TSH: TSH: 0.28 u[IU]/mL — ABNORMAL LOW (ref 0.35–5.50)

## 2021-04-20 LAB — VITAMIN B12: Vitamin B-12: 224 pg/mL (ref 211–911)

## 2021-04-20 LAB — HEMOGLOBIN A1C: Hgb A1c MFr Bld: 6.9 % — ABNORMAL HIGH (ref 4.6–6.5)

## 2021-04-20 LAB — VITAMIN D 25 HYDROXY (VIT D DEFICIENCY, FRACTURES): VITD: 65.09 ng/mL (ref 30.00–100.00)

## 2021-04-20 LAB — LDL CHOLESTEROL, DIRECT: Direct LDL: 58 mg/dL

## 2021-04-20 MED ORDER — UBRELVY 100 MG PO TABS: ORAL_TABLET | ORAL | 5 refills | Status: DC

## 2021-04-20 MED ORDER — BELSOMRA 10 MG PO TABS: 10.0000 mg | ORAL_TABLET | Freq: Every evening | ORAL | 5 refills | Status: DC | PRN

## 2021-04-20 NOTE — Patient Instructions (Signed)
Please take all new medication as prescribed - the ubrelvy if ok with the insurance, and the belsomra for sleep as needed  Your left ear was irrigated today  Please continue all other medications as before, and refills have been done if requested.  Please have the pharmacy call with any other refills you may need.  Please continue your efforts at being more active, low cholesterol diet, and weight control.  You are otherwise up to date with prevention measures today.  Please keep your appointments with your specialists as you may have planned  Please go to the LAB at the blood drawing area for the tests to be done  You will be contacted by phone if any changes need to be made immediately.  Otherwise, you will receive a letter about your results with an explanation, but please check with MyChart first.  Please remember to sign up for MyChart if you have not done so, as this will be important to you in the future with finding out test results, communicating by private email, and scheduling acute appointments online when needed.  Please make an Appointment to return in 6 months, or sooner if needed

## 2021-04-20 NOTE — Patient Instructions (Signed)
[name] , Thank you for taking time to come for your Medicare Wellness Visit. I appreciate your ongoing commitment to your health goals. Please review the following plan we discussed and let me know if I can assist you in the future.   Screening recommendations/referrals: Colonoscopy: 02/12/2019; due every 3 years (due 02/11/2022) Mammogram: 10/20/2018; due every 1-2 years Bone Density: 05/15/2010; no repeat recommended Recommended yearly ophthalmology/optometry visit for glaucoma screening and checkup Recommended yearly dental visit for hygiene and checkup  Vaccinations: Influenza vaccine: 12/[facility] at [facility] Pneumococcal vaccine: 04/17/2017, 10/16/2019 Tdap vaccine: 04/17/2016; due every 10 years Shingles vaccine: never done   Covid-19: 05/22/2019, 06/15/2019, 01/01/2020  Advanced directives: Advance directive discussed with you today. Even though you declined this today please call our office should you change your mind and we can give you the proper paperwork for you to fill out.  Conditions/risks identified: Yes; Client understands the importance of follow-up appointments with providers by attending scheduled visits and discussed goals to eat healthier, increase physical activity 5 times a week for 30 minutes each, exercise the brain by doing stimulating brain exercises (reading, adult coloring, crafting, listening to music, puzzles, etc.), socialize and enjoy life more, get enough sleep at least 8-9 hours average per night and make time for laughter.  Next appointment: Please schedule your next Medicare Wellness Visit with your Nurse Health Advisor in 1 year by calling [phone].   Preventive Care 59 Years and Older, Female Preventive care refers to lifestyle choices and visits with your health care provider that can promote health and wellness. What does preventive care include? A yearly physical exam. This is also called an annual well check. Dental exams once or twice a year. Routine  eye exams. Ask your health care provider how often you should have your eyes checked. Personal lifestyle choices, including: Daily care of your teeth and gums. Regular physical activity. Eating a healthy diet. Avoiding tobacco and drug use. Limiting alcohol use. Practicing safe sex. Taking low-dose aspirin every day. Taking vitamin and mineral supplements as recommended by your health care provider. What happens during an annual well check? The services and screenings done by your health care provider during your annual well check will depend on your age, overall health, lifestyle risk factors, and family history of disease. Counseling  Your health care provider may ask you questions about your: Alcohol use. Tobacco use. Drug use. Emotional well-being. Home and relationship well-being. Sexual activity. Eating habits. History of falls. Memory and ability to understand (cognition). Work and work Statistician. Reproductive health. Screening  You may have the following tests or measurements: Height, weight, and BMI. Blood pressure. Lipid and cholesterol levels. These may be checked every 5 years, or more frequently if you are over 22 years old. Skin check. Lung cancer screening. You may have this screening every year starting at age 65 if you have a 30-pack-year history of smoking and currently smoke or have quit within the past 15 years. Fecal occult blood test (FOBT) of the stool. You may have this test every year starting at age 40. Flexible sigmoidoscopy or colonoscopy. You may have a sigmoidoscopy every 5 years or a colonoscopy every 10 years starting at age 74. Hepatitis C blood test. Hepatitis B blood test. Sexually transmitted disease (STD) testing. Diabetes screening. This is done by checking your blood sugar (glucose) after you have not eaten for a while (fasting). You may have this done every 1-3 years. Bone density scan. This is done to screen for osteoporosis.  You may  have this done starting at age 39. Mammogram. This may be done every 1-2 years. Talk to your health care provider about how often you should have regular mammograms. Talk with your health care provider about your test results, treatment options, and if necessary, the need for more tests. Vaccines  Your health care provider may recommend certain vaccines, such as: Influenza vaccine. This is recommended every year. Tetanus, diphtheria, and acellular pertussis (Tdap, Td) vaccine. You may need a Td booster every 10 years. Zoster vaccine. You may need this after age 14. Pneumococcal 13-valent conjugate (PCV13) vaccine. One dose is recommended after age 65. Pneumococcal polysaccharide (PPSV23) vaccine. One dose is recommended after age 2. Talk to your health care provider about which screenings and vaccines you need and how often you need them. This information is not intended to replace advice given to you by your health care provider. Make sure you discuss any questions you have with your health care provider. Document Released: 03/11/2015 Document Revised: 11/02/2015 Document Reviewed: 12/14/2014 Elsevier Interactive Patient Education  2017 [place] Prevention in the Home Falls can cause injuries. They can happen to people of all ages. There are many things you can do to make your home safe and to help prevent falls. What can I do on the outside of my home? Regularly fix the edges of walkways and driveways and fix any cracks. Remove anything that might make you trip as you walk through a door, such as a raised step or threshold. Trim any bushes or trees on the path to your home. Use bright outdoor lighting. Clear any walking paths of anything that might make someone trip, such as rocks or tools. Regularly check to see if handrails are loose or broken. Make sure that both sides of any steps have handrails. Any raised decks and porches should have guardrails on the edges. Have any  leaves, snow, or ice cleared regularly. Use sand or salt on walking paths during winter. Clean up any spills in your garage right away. This includes oil or grease spills. What can I do in the bathroom? Use night lights. Install grab bars by the toilet and in the tub and shower. Do not use towel bars as grab bars. Use non-skid mats or decals in the tub or shower. If you need to sit down in the shower, use a plastic, non-slip stool. Keep the floor dry. Clean up any water that spills on the floor as soon as it happens. Remove soap buildup in the tub or shower regularly. Attach bath mats securely with double-sided non-slip rug tape. Do not have throw rugs and other things on the floor that can make you trip. What can I do in the bedroom? Use night lights. Make sure that you have a light by your bed that is easy to reach. Do not use any sheets or blankets that are too big for your bed. They should not hang down onto the floor. Have a firm chair that has side arms. You can use this for support while you get dressed. Do not have throw rugs and other things on the floor that can make you trip. What can I do in the kitchen? Clean up any spills right away. Avoid walking on wet floors. Keep items that you use a lot in easy-to-reach places. If you need to reach something above you, use a strong step stool that has a grab bar. Keep electrical cords out of the way. Do not use  floor polish or wax that makes floors slippery. If you must use wax, use non-skid floor wax. Do not have throw rugs and other things on the floor that can make you trip. What can I do with my stairs? Do not leave any items on the stairs. Make sure that there are handrails on both sides of the stairs and use them. Fix handrails that are broken or loose. Make sure that handrails are as long as the stairways. Check any carpeting to make sure that it is firmly attached to the stairs. Fix any carpet that is loose or worn. Avoid  having throw rugs at the top or bottom of the stairs. If you do have throw rugs, attach them to the floor with carpet tape. Make sure that you have a light switch at the top of the stairs and the bottom of the stairs. If you do not have them, ask someone to add them for you. What else can I do to help prevent falls? Wear shoes that: Do not have high heels. Have rubber bottoms. Are comfortable and fit you well. Are closed at the toe. Do not wear sandals. If you use a stepladder: Make sure that it is fully opened. Do not climb a closed stepladder. Make sure that both sides of the stepladder are locked into place. Ask someone to hold it for you, if possible. Clearly mark and make sure that you can see: Any grab bars or handrails. First and last steps. Where the edge of each step is. Use tools that help you move around (mobility aids) if they are needed. These include: Canes. Walkers. Scooters. Crutches. Turn on the lights when you go into a dark area. Replace any light bulbs as soon as they burn out. Set up your furniture so you have a clear path. Avoid moving your furniture around. If any of your floors are uneven, fix them. If there are any pets around you, be aware of where they are. Review your medicines with your doctor. Some medicines can make you feel dizzy. This can increase your chance of falling. Ask your doctor what other things that you can do to help prevent falls. This information is not intended to replace advice given to you by your health care provider. Make sure you discuss any questions you have with your health care provider. Document Released: 12/09/2008 Document Revised: 07/21/2015 Document Reviewed: 03/19/2014 Elsevier Interactive Patient Education  2017 [employer].

## 2021-04-20 NOTE — Progress Notes (Signed)
Patient ID: Savannah [name], female   DOB: 1955-09-10, 66 y.o.   MRN: [id number]         Chief Complaint:: yearly exam       HPI:  Savannah [name] is a 66 y.o. female  overall doing well, except for 2-3 wks onset worsening recurring bilateral temporal HA every few days at home, mod to severe at times, throbbing at times but no n/v, does tend to last several hours at a time. Also having worsening sleep issue, just cant seem to get to sleep most nights.    Plans to get mammogram and GYN appt soon.  Pt denies chest pain, increased sob or doe, wheezing, orthopnea, PND, increased LE swelling, palpitations, dizziness or syncope.   Pt denies polydipsia, polyuria, or new focal neuro s/s.  Also has left ear hearing loss worsening in the several months, but no tinnisuts, vertigo .     Wt Readings from Last 3 Encounters:  04/20/21 217 lb 9.6 oz (98.7 kg)  04/20/21 217 lb 9.6 oz (98.7 kg)  01/27/21 215 lb 11.2 oz (97.8 kg)   BP Readings from Last 3 Encounters:  04/20/21 120/60  04/20/21 120/60  01/27/21 (!) 162/88   Immunization History  Administered Date(s) Administered   Fluad Quad(high Dose 65+) 12/09/2019, 12/31/2020   Influenza Inj Mdck Quad Pf 12/20/2016   Influenza Split 12/29/2010, 12/28/2011   Influenza Whole 12/16/2007, 12/06/2008, 12/26/2009   Influenza,inj,Quad PF,6+ Mos 04/15/2015, 01/06/2016, 11/07/2017, 02/04/2019   Influenza-Unspecified 11/26/2013, 12/27/2016   PFIZER(Purple Top)SARS-COV-2 Vaccination 05/22/2019, 06/15/2019, 01/01/2020   Pneumococcal Conjugate-13 10/16/2019   Pneumococcal Polysaccharide-23 05/09/2010, 04/17/2017   Td 11/16/2004   Tdap 04/17/2016   Zoster, Live 04/15/2015   There are no preventive care reminders to display for this patient.     Past Medical History:  Diagnosis Date   Allergic rhinitis    Arthritis    arthritis in back   Asthma    B12 deficiency    Cancer ([place]) 2016   kidney    CKD (chronic kidney disease) 04/20/2020   stage 3 per  OV note on 04/20/20 from Dr. 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),  clinically  localized w/ negative margins   Hypercholesterolemia    Hypertension    Hypothyroidism    endocrinologist -  dr ellision   Iron deficiency anemia    Lumbar spondylosis    NAFLD (nonalcoholic fatty liver disease)    dx 2010--- last hepatic panel in epic dated 04-17-2017   Nocturia    OA (osteoarthritis of spine)    Lumbar   OSA on CPAP pulmologist-  dr Halford Chessman   sleep study 09-11-2004  very severe osa , AHI 119/hr,  setting 10   Peripheral neuropathy    bottom of feet   Renal calculus, right    Sleep apnea    uses CPAP   Type 2 diabetes mellitus ([place])    endocrinoloigst-  dr Loanne Drilling,  last A1c 5.9 on 02-20-219 in epic   Vitamin B 12 deficiency    Wears glasses    Past Surgical History:  Procedure Laterality Date   BUNIONECTOMY/  HAMMERTOE CORRECTION'S  09-07-2013   SC[facility]   RIGHT FOOT 2ND,  3RD, Jodie Echevaria DIGITS   CARDIAC CATHETERIZATION  06-01-2009  dr Angelena Form   no evidence CAD,  normal LVSF, elevated blood pressure w/ elevated end-diastolic pressure, ef 22-48%   CARDIOVASCULAR STRESS TEST  05-11-2009   dr Angelena Form   abnormal lexiscan nuclear study (no exercise) w/ mild ischemia in the distal anteroseptal wall and apex, this defect may be due to shifting breast attenuation   COLONOSCOPY  last 02/08/2014   COLONOSCOPY WITH SNARE POLYPECTOMY WITH ESOPHAGOGASTRODUODENOSCOPY  02/08/2014   with biopsy Dr. Jerilynn Mages. [facility]   CYSTOSCOPY WITH RETROGRADE PYELOGRAM, URETEROSCOPY AND STENT PLACEMENT Right 06/07/2017   Procedure: CYSTOSCOPY WITH RETROGRADE PYELOGRAM, URETEROSCOPY, LITHOTRIPSY,  AND STENT PLACEMENT 1st STAGE;  Surgeon: Alexis Frock, MD;  Location: [facility];  Service: Urology;  Laterality: Right;   CYSTOSCOPY WITH RETROGRADE PYELOGRAM, URETEROSCOPY AND STENT PLACEMENT Right 06/21/2017   Procedure: CYSTOSCOPY WITH RETROGRADE PYELOGRAM, URETEROSCOPY AND STENT PLACEMENT 2ND STAGE  STENT" EXCHANGE";  Surgeon: Alexis Frock, MD;  Location: [facility];  Service: Urology;  Laterality: Right;   CYSTOSCOPY WITH RETROGRADE PYELOGRAM, URETEROSCOPY AND STENT PLACEMENT Right 01/13/2021   Procedure: FIRST STAGE CYSTOSCOPY WITH RETROGRADE PYELOGRAM, URETEROSCOPY AND STENT PLACEMENT;  Surgeon: Alexis Frock, MD;  Location: [facility];  Service: Urology;  Laterality: Right;  16 MINS   CYSTOSCOPY WITH RETROGRADE PYELOGRAM, URETEROSCOPY AND STENT PLACEMENT Right 01/27/2021   Procedure: SECOND STAGE CYSTOSCOPY WITH RETROGRADE PYELOGRAM, URETEROSCOPY AND STENT EXCHANGE;  Surgeon: Alexis Frock, MD;  Location: [facility];  Service: Urology;  Laterality: Right;  90 MINS   FRACTURE SURGERY Right    Compound fracture foot   FRACTURE SURGERY Right 1963   Pt had surgery to repair broken arm.   HOLMIUM LASER APPLICATION Right 25/00/3704   Procedure: HOLMIUM LASER APPLICATION;  Surgeon: Alexis Frock, MD;  Location: [facility];  Service: Urology;  Laterality: Right;   HOLMIUM LASER APPLICATION Right 88/89/1694   Procedure: HOLMIUM LASER APPLICATION;  Surgeon: Alexis Frock, MD;  Location: [facility];  Service: Urology;  Laterality: Right;   HOLMIUM LASER APPLICATION Right 50/38/8828   Procedure: HOLMIUM LASER APPLICATION;  Surgeon: Alexis Frock, MD;  Location: [facility];  Service: Urology;  Laterality: Right;   HOLMIUM LASER APPLICATION Right 00/04/4915   Procedure: HOLMIUM LASER APPLICATION;  Surgeon: Alexis Frock, MD;  Location: [facility];  Service: Urology;  Laterality: Right;   KNEE ARTHROSCOPY Left 1993   POLYPECTOMY     ROBOTIC ASSITED PARTIAL NEPHRECTOMY Right 05/19/2014    Procedure: ROBOTIC ASSITED PARTIAL NEPHRECTOMY;  Surgeon: Alexis Frock, MD;  Location: [facility];  Service: Urology;  Laterality: Right;    reports that she quit smoking about 46 years ago. Her smoking use included cigarettes. She has a 0.60 pack-year smoking history. She has never used smokeless tobacco. She reports that she does not drink alcohol and does not use drugs. family history includes Allergies in her sister; Asthma in her sister; Clotting disorder in her grandchild; Colon cancer (age of onset: 35) in her mother; Colon polyps in her mother; Diabetes in her mother; Heart attack in her father; Heart disease in her father and paternal grandmother; Kidney disease in her mother. Allergies  Allergen Reactions   Erythromycin Nausea And Vomiting   Current Outpatient Medications on File Prior to Visit  Medication Sig Dispense Refill   albuterol (PROAIR HFA) 108 (90 Base) MCG/ACT inhaler Inhale 2 puffs into the lungs every 4 (four) hours as needed for wheezing or  shortness of breath (cough). 3 each 3   atorvastatin (LIPITOR) 40 MG tablet TAKE 1 TABLET BY MOUTH EVERY DAY 90 tablet 1   cholecalciferol (VITAMIN D) 1000 units tablet Take 1,000 Units by mouth daily.     citalopram (CELEXA) 20 MG tablet TAKE 1 TABLET BY MOUTH EVERY DAY IN THE MORNING 90 tablet 2   collagenase (SANTYL) ointment Apply 1 application topically daily.     FARXIGA 5 MG TABS tablet Take 5 mg by mouth daily.     fluticasone-salmeterol (ADVAIR) 100-50 MCG/ACT AEPB Inhale 1 puff into the lungs 2 (two) times daily. (Patient taking differently: Inhale 1 puff into the lungs daily.) 3 each 3   gabapentin (NEURONTIN) 300 MG capsule TAKE 1 CAPSULE BY MOUTH EVERYDAY AT BEDTIME 90 capsule 1   hyoscyamine (LEVSIN SL) 0.125 MG SL tablet Take 1-2 tablets by mouth or under tongue every 4 hours as needed. 100 tablet 5   levothyroxine (SYNTHROID) 137 MCG tablet TAKE 1 TABLET BY MOUTH DAILY BEFORE BREAKFAST. 90 tablet 2   losartan (COZAAR) 100  MG tablet Take 100 mg by mouth daily.     metFORMIN (GLUCOPHAGE-XR) 500 MG 24 hr tablet TAKE 2 TABLETS BY MOUTH TWICE A DAY 360 tablet 2   metoprolol succinate (TOPROL-XL) 25 MG 24 hr tablet TAKE 1/2 TABLET BY MOUTH EVERY DAY 45 tablet 1   ondansetron (ZOFRAN-ODT) 4 MG disintegrating tablet Take 1 tablet (4 mg total) by mouth every 8 (eight) hours as needed for nausea or vomiting. 20 tablet 0   Oxycodone HCl 10 MG TABS Take 1 tablet (10 mg total) by mouth every 6 (six) hours as needed (breakthrogh pain post-operatively). 10 tablet 0   RESTASIS 0.05 % ophthalmic emulsion Place 1 drop into both eyes 2 (two) times daily.     senna-docusate (SENOKOT-S) 8.6-50 MG tablet Take 1 tablet by mouth 2 (two) times daily. While taking strong pain meds to prevent constipation 10 tablet 0   traZODone (DESYREL) 100 MG tablet TAKE 1 TABLET BY MOUTH EVERYDAY AT BEDTIME 90 tablet 1   No current facility-administered medications on file prior to visit.        ROS:  All others reviewed and negative.  Objective        PE:  BP 120/60    Pulse 80    Temp 98.8 F (37.1 C) (Oral)    Ht 5\' 8"  (1.727 m)    Wt 217 lb 9.6 oz (98.7 kg)    SpO2 95%    BMI 33.09 kg/m                 Constitutional: Pt appears in NAD               HENT: Head: NCAT.                Right Ear: External ear normal.                 Left Ear: External ear normal.                Eyes: . Pupils are equal, round, and reactive to light. Conjunctivae and EOM are normal               Nose: without d/c or deformity               Neck: Neck supple. Gross normal ROM               Cardiovascular: Normal rate  and regular rhythm.                 Pulmonary/Chest: Effort normal and breath sounds without rales or wheezing.                Abd:  Soft, NT, ND, + BS, no organomegaly               Neurological: Pt is alert. At baseline orientation, motor grossly intact               Skin: Skin is warm. No rashes, no other new lesions, LE edema - none                Psychiatric: Pt behavior is normal without agitation   Micro: none  Cardiac tracings I have personally interpreted today:  none  Pertinent Radiological findings (summarize): none   Lab Results  Component Value Date   WBC 7.9 04/20/2021   HGB 13.7 04/20/2021   HCT 40.9 04/20/2021   PLT 192.0 04/20/2021   GLUCOSE 119 (H) 04/20/2021   CHOL 139 04/20/2021   TRIG 202.0 (H) 04/20/2021   HDL 56.10 04/20/2021   LDLDIRECT 58.0 04/20/2021   LDLCALC 53 10/16/2019   ALT 21 04/20/2021   AST 19 04/20/2021   NA 141 04/20/2021   K 4.2 04/20/2021   CL 105 04/20/2021   CREATININE 1.21 (H) 04/20/2021   BUN 23 04/20/2021   CO2 26 04/20/2021   TSH 0.28 (L) 04/20/2021   INR 1.1 ratio (H) 05/23/2009   HGBA1C 6.9 (H) 04/20/2021   MICROALBUR 1.4 04/20/2021   Assessment/Plan:  Savannah [name] is a 66 y.o. White or Caucasian [1] female with  has a past medical history of Allergic rhinitis, Arthritis, Asthma, B12 deficiency, Cancer ([place]) (2016), CKD (chronic kidney disease) (04/20/2020), Depression, Grade I internal hemorrhoids, Hiatal hernia, History of colon polyps, History of kidney stones, History of renal cell carcinoma (urologist-  dr Tresa Moore), Hypercholesterolemia, Hypertension, Hypothyroidism, Iron deficiency anemia, Lumbar spondylosis, NAFLD (nonalcoholic fatty liver disease), Nocturia, OA (osteoarthritis of spine), OSA on CPAP (pulmologist-  dr Halford Chessman), Peripheral neuropathy, Renal calculus, right, Sleep apnea, Type 2 diabetes mellitus ([place]), Vitamin B 12 deficiency, and Wears glasses.  B12 deficiency Lab Results  Component Value Date   [id number] 04/20/2021   Low, to start oral replacement - b12 1000 mcg qd   Diabetes ([place]) Lab Results  Component Value Date   HGBA1C 6.9 (H) 04/20/2021   Stable, pt to continue current medical treatment farxiga, metformin   Vitamin D deficiency Last vitamin D Lab Results  Component Value Date   VD25OH 65.09 04/20/2021   Stable, cont oral  replacement   Left ear impacted cerumen Resolved with irrigation and hearing improved  Ceruminosis is noted.  Wax is removed by syringing and manual debridement. Instructions for home care to prevent wax buildup are given.   Insomnia Uncontrolled mild recent worsening - for belsomra prn  Headache Mild to mod worsening, for ubrelvy prn,  to f/u any worsening symptoms or concerns  Followup: No follow-ups on file.  Cathlean Cower, MD 04/22/2021 6:53 PM [facility]

## 2021-04-20 NOTE — Progress Notes (Signed)
Patient consent obtained. Irrigation with water and peroxide performed on left ear. Full view of tympanic membranes after procedure.  Patient tolerated procedure well.

## 2021-04-20 NOTE — Progress Notes (Signed)
Subjective:   Savannah [name] is a 66 y.o. female who presents for Medicare Annual (Subsequent) preventive examination.  Review of Systems     Cardiac Risk Factors include: advanced age (>39men, >62 women);diabetes mellitus;dyslipidemia;family history of premature cardiovascular disease;hypertension;obesity (BMI >30kg/m2)     Objective:    Today's Vitals   04/20/21 1005  BP: 120/60  Pulse: 80  Resp: 16  Temp: 98.8 F (37.1 C)  TempSrc: Oral  SpO2: 95%  Weight: 217 lb 9.6 oz (98.7 kg)  Height: 5\' 8"  (1.727 m)  PainSc: 0-No pain   Body mass index is 33.09 kg/m.  Advanced Directives 04/20/2021 01/13/2021 01/10/2021 03/07/2018 03/06/2018 06/21/2017 06/07/2017  Does Patient Have a Medical Advance Directive? No No No No No No No  Does patient want to make changes to medical advance directive? - - - No - Patient declined Yes (ED - Information included in AVS) - -  Would patient like information on creating a medical advance directive? No - Patient declined No - Patient declined - No - Patient declined - No - Patient declined Yes (Inpatient - patient defers creating a medical advance directive at this time)  Pre-existing out of facility DNR order (yellow form or pink MOST form) - - - - - - -    Current Medications (verified) Outpatient Encounter Medications as of 04/20/2021  Medication Sig   albuterol (PROAIR HFA) 108 (90 Base) MCG/ACT inhaler Inhale 2 puffs into the lungs every 4 (four) hours as needed for wheezing or shortness of breath (cough).   atorvastatin (LIPITOR) 40 MG tablet TAKE 1 TABLET BY MOUTH EVERY DAY   cholecalciferol (VITAMIN D) 1000 units tablet Take 1,000 Units by mouth daily.   citalopram (CELEXA) 20 MG tablet TAKE 1 TABLET BY MOUTH EVERY DAY IN THE MORNING   collagenase (SANTYL) ointment Apply 1 application topically daily.   FARXIGA 5 MG TABS tablet Take 5 mg by mouth daily.   fluticasone-salmeterol (ADVAIR) 100-50 MCG/ACT AEPB Inhale 1 puff into the lungs 2  (two) times daily. (Patient taking differently: Inhale 1 puff into the lungs daily.)   gabapentin (NEURONTIN) 300 MG capsule TAKE 1 CAPSULE BY MOUTH EVERYDAY AT BEDTIME   hyoscyamine (LEVSIN SL) 0.125 MG SL tablet Take 1-2 tablets by mouth or under tongue every 4 hours as needed.   levothyroxine (SYNTHROID) 137 MCG tablet TAKE 1 TABLET BY MOUTH DAILY BEFORE BREAKFAST.   losartan (COZAAR) 100 MG tablet Take 100 mg by mouth daily.   metFORMIN (GLUCOPHAGE-XR) 500 MG 24 hr tablet TAKE 2 TABLETS BY MOUTH TWICE A DAY   metoprolol succinate (TOPROL-XL) 25 MG 24 hr tablet TAKE 1/2 TABLET BY MOUTH EVERY DAY   ondansetron (ZOFRAN-ODT) 4 MG disintegrating tablet Take 1 tablet (4 mg total) by mouth every 8 (eight) hours as needed for nausea or vomiting.   Oxycodone HCl 10 MG TABS Take 1 tablet (10 mg total) by mouth every 6 (six) hours as needed (breakthrogh pain post-operatively).   RESTASIS 0.05 % ophthalmic emulsion Place 1 drop into both eyes 2 (two) times daily.   senna-docusate (SENOKOT-S) 8.6-50 MG tablet Take 1 tablet by mouth 2 (two) times daily. While taking strong pain meds to prevent constipation   traZODone (DESYREL) 100 MG tablet TAKE 1 TABLET BY MOUTH EVERYDAY AT BEDTIME   No facility-administered encounter medications on file as of 04/20/2021.    Allergies (verified) Erythromycin   History: Past Medical History:  Diagnosis Date   Allergic rhinitis    Arthritis  arthritis in back   Asthma    B12 deficiency    Cancer ([place]) 2016   kidney    CKD (chronic kidney disease) 04/20/2020   stage 3 per OV note on 04/20/20 from Dr. 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),  clinically  localized w/ negative margins   Hypercholesterolemia    Hypertension     Hypothyroidism    endocrinologist -  dr ellision   Iron deficiency anemia    Lumbar spondylosis    NAFLD (nonalcoholic fatty liver disease)    dx 2010--- last hepatic panel in epic dated 04-17-2017   Nocturia    OA (osteoarthritis of spine)    Lumbar   OSA on CPAP pulmologist-  dr Halford Chessman   sleep study 09-11-2004  very severe osa , AHI 119/hr,  setting 10   Peripheral neuropathy    bottom of feet   Renal calculus, right    Sleep apnea    uses CPAP   Type 2 diabetes mellitus ([place])    endocrinoloigst-  dr Loanne Drilling,  last A1c 5.9 on 02-20-219 in epic   Vitamin B 12 deficiency    Wears glasses    Past Surgical History:  Procedure Laterality Date   BUNIONECTOMY/  HAMMERTOE CORRECTION'S  09-07-2013   SCG   RIGHT FOOT 2ND, 3RD, 4TH, 5TH DIGITS   CARDIAC CATHETERIZATION  06-01-2009  dr Angelena Form   no evidence CAD,  normal LVSF, elevated blood pressure w/ elevated end-diastolic pressure, ef 16-10%   CARDIOVASCULAR STRESS TEST  05-11-2009   dr Angelena Form   abnormal lexiscan nuclear study (no exercise) w/ mild ischemia in the distal anteroseptal wall and apex, this defect may be due to shifting breast attenuation   COLONOSCOPY  last 02/08/2014   COLONOSCOPY WITH SNARE POLYPECTOMY WITH ESOPHAGOGASTRODUODENOSCOPY  02/08/2014   with biopsy Dr. Jerilynn Mages. [facility] Plan   CYSTOSCOPY WITH RETROGRADE PYELOGRAM, URETEROSCOPY AND STENT PLACEMENT Right 06/07/2017   Procedure: CYSTOSCOPY WITH RETROGRADE PYELOGRAM, URETEROSCOPY, LITHOTRIPSY,  AND STENT PLACEMENT 1st STAGE;  Surgeon: Alexis Frock, MD;  Location: [facility];  Service: Urology;  Laterality: Right;   CYSTOSCOPY WITH RETROGRADE PYELOGRAM, URETEROSCOPY AND STENT PLACEMENT Right 06/21/2017   Procedure: CYSTOSCOPY WITH RETROGRADE PYELOGRAM, URETEROSCOPY AND STENT PLACEMENT 2ND STAGE  STENT" EXCHANGE";  Surgeon: Alexis Frock, MD;  Location: [facility];  Service: Urology;  Laterality: Right;   CYSTOSCOPY WITH RETROGRADE PYELOGRAM,  URETEROSCOPY AND STENT PLACEMENT Right 01/13/2021   Procedure: FIRST STAGE CYSTOSCOPY WITH RETROGRADE PYELOGRAM, URETEROSCOPY AND STENT PLACEMENT;  Surgeon: Alexis Frock, MD;  Location: [facility];  Service: Urology;  Laterality: Right;  67 MINS   CYSTOSCOPY WITH RETROGRADE PYELOGRAM, URETEROSCOPY AND STENT PLACEMENT Right 01/27/2021   Procedure: SECOND STAGE CYSTOSCOPY WITH RETROGRADE PYELOGRAM, URETEROSCOPY AND STENT EXCHANGE;  Surgeon: Alexis Frock, MD;  Location: [facility];  Service: Urology;  Laterality: Right;  90 MINS   FRACTURE SURGERY Right    Compound fracture foot   FRACTURE SURGERY Right 1963   Pt had surgery to repair broken arm.   HOLMIUM LASER APPLICATION Right 96/05/5407   Procedure: HOLMIUM LASER APPLICATION;  Surgeon: Alexis Frock, MD;  Location: [facility];  Service: Urology;  Laterality:  Right;   HOLMIUM LASER APPLICATION Right 54/98/2641   Procedure: HOLMIUM LASER APPLICATION;  Surgeon: Alexis Frock, MD;  Location: [facility];  Service: Urology;  Laterality: Right;   HOLMIUM LASER APPLICATION Right 58/30/9407   Procedure: HOLMIUM LASER APPLICATION;  Surgeon: Alexis Frock, MD;  Location: [facility];  Service: Urology;  Laterality: Right;   HOLMIUM LASER APPLICATION Right 68/0/8811   Procedure: HOLMIUM LASER APPLICATION;  Surgeon: Alexis Frock, MD;  Location: [facility];  Service: Urology;  Laterality: Right;   KNEE ARTHROSCOPY Left 1993   POLYPECTOMY     ROBOTIC ASSITED PARTIAL NEPHRECTOMY Right 05/19/2014   Procedure: ROBOTIC ASSITED PARTIAL NEPHRECTOMY;  Surgeon: Alexis Frock, MD;  Location: [facility];  Service: Urology;  Laterality: Right;   Family History  Problem Relation Age of Onset   Colon cancer Mother 28   Diabetes Mother    Kidney disease Mother    Colon polyps Mother    Heart attack Father    Heart disease Father    Allergies Sister    Asthma Sister    Clotting disorder Grandchild     Heart disease Paternal Grandmother    Esophageal cancer Neg Hx    Rectal cancer Neg Hx    Stomach cancer Neg Hx    Social History   Socioeconomic History   Marital status: Married    Spouse name: Not on file   Number of children: 2   Years of education: Not on file   Highest education level: Not on file  Occupational History   Occupation: Scientist, research (physical sciences): UNEMPLOYED  Tobacco Use   Smoking status: Former    Packs/day: 0.30    Years: 2.00    Pack years: 0.60    Types: Cigarettes    Quit date: 02/27/1975    Years since quitting: 46.1   Smokeless tobacco: Never  Vaping Use   Vaping Use: Never used  Substance and Sexual Activity   Alcohol use: No   Drug use: No   Sexual activity: Not on file  Other Topics Concern   Not on file  Social History Narrative   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   Social Determinants of Health   Financial Resource Strain: Low Risk    Difficulty of Paying Living Expenses: Not hard at all  Food Insecurity: No Food Insecurity   Worried About Charity fundraiser in the Last Year: Never true   [place] in the Last Year: Never true  Transportation Needs: No Transportation Needs   Lack of Transportation (Medical): No   Lack of Transportation (Non-Medical): No  Physical Activity: Inactive   Days of Exercise per Week: 0 days   Minutes of Exercise per Session: 0 min  Stress: No Stress Concern Present   Feeling of Stress : Not at all  Social Connections: Socially Integrated   Frequency of Communication with Friends and Family: More than three times a week   Frequency of Social Gatherings with Friends and Family: More than three times a week   Attends Religious Services: More than 4 times per year   Active Member of [employer] or Organizations: Yes   Attends Music therapist: More than 4 times per year   Marital Status: Married    Tobacco Counseling Counseling given: Not Answered   Clinical Intake:  Pre-visit  preparation completed: Yes  Pain : No/denies pain Pain Score: 0-No pain     BMI - recorded: 33.09  Nutritional Status: BMI > 30  Obese Nutritional Risks: None Diabetes: Yes CBG done?: No Did pt. bring in CBG monitor from home?: No  How often do you need to have someone help you when you read instructions, pamphlets, or other written materials from your doctor or pharmacy?: 1 - Never What is the last grade level you completed in school?: Associate's Degree  Diabetic? yes  Interpreter Needed?: No  Information entered by :: Lisette Abu, LPN   Activities of Daily Living In your present state of health, do you have any difficulty performing the following activities: 04/20/2021 01/13/2021  Hearing? N N  Vision? N N  Difficulty concentrating or making decisions? N N  Walking or climbing stairs? Y Y  Comment uses a cane for gait/balance -  Dressing or bathing? N N  Doing errands, shopping? N -  Preparing Food and eating ? N -  Using the Toilet? N -  In the past six months, have you accidently leaked urine? N -  Comment wears Poise Pads for protection/accidents -  Do you have problems with loss of bowel control? N -  Managing your Medications? N -  Managing your Finances? N -  Housekeeping or managing your Housekeeping? N -  Some recent data might be hidden    Patient Care Team: Biagio Borg, MD as PCP - General (Internal Medicine) Harriet Masson, DPM as Consulting Physician (Podiatry) Suella Broad, MD (Physical Medicine and Rehabilitation) Carolan Clines, MD (Inactive) as Attending Physician (Urology) Paula Compton, MD (Obstetrics and Gynecology) Clance, Armando Reichert, MD (Pulmonary Disease) Himmelrich, Bryson Ha, RD (Inactive) as Dietitian Willow Ora as Consulting Physician (Optometry)  Indicate any recent Medical Services you may have received from other than [facility] providers in the past year (date may be approximate).     Assessment:   This is a routine  wellness examination for [place].  Hearing/Vision screen Hearing Screening - Comments:: Patient denied any hearing difficulty.   No hearing aids.  Vision Screening - Comments:: Patient wears corrective glasses/contacts.   Eye exam done annually by: [facility]/Dr. Willow Ora  Dietary issues and exercise activities discussed: Current Exercise Habits: The patient does not participate in regular exercise at present, Exercise limited by: respiratory conditions(s)   Goals Addressed               This Visit's Progress     Client understands the importance of follow-up with providers by attending scheduled visits (pt-stated)        My goal is to lose 50-60 pounds.      Depression Screen PHQ 2/9 Scores 04/20/2021 04/20/2020 10/16/2019 12/02/2017 02/18/2015  PHQ - 2 Score 0 1 0 0 0    Fall Risk Fall Risk  04/20/2021 04/20/2020 10/16/2019 04/21/2019 12/02/2017  Falls in the past year? 0 0 0 0 No  Number falls in past yr: 0 - - - -  Injury with Fall? 0 - - - -  Risk for fall due to : No Fall Risks - - - -  Follow up Falls evaluation completed - - - -    FALL RISK PREVENTION PERTAINING TO THE HOME:  Any stairs in or around the home? No  If so, are there any without handrails? No  Home free of loose throw rugs in walkways, pet beds, electrical cords, etc? Yes  Adequate lighting in your home to reduce risk of falls? Yes   ASSISTIVE DEVICES UTILIZED TO PREVENT FALLS:  Life alert? No  Use of a  cane, walker or w/c? Yes  Grab bars in the bathroom? Yes  Shower chair or bench in shower? Yes  Elevated toilet seat or a handicapped toilet? Yes   TIMED UP AND GO:  Was the test performed? Yes .  Length of time to ambulate 10 feet: 10 sec.   Gait slow and steady with assistive device  Cognitive Function: Normal cognitive status assessed by direct observation by this Nurse Health Advisor. No abnormalities found.       6CIT Screen 04/20/2021  What Year? 0 points  What month? 0  points  What time? 0 points  Count back from 20 0 points  Months in reverse 0 points  Repeat phrase 0 points  Total Score 0    Immunizations Immunization History  Administered Date(s) Administered   Fluad Quad(high Dose 65+) 12/09/2019, 12/31/2020   Influenza Inj Mdck Quad Pf 12/20/2016   Influenza Split 12/29/2010, 12/28/2011   Influenza Whole 12/16/2007, 12/06/2008, 12/26/2009   Influenza,inj,Quad PF,6+ Mos 04/15/2015, 01/06/2016, 11/07/2017, 02/04/2019   Influenza-Unspecified 11/26/2013, 12/27/2016   PFIZER(Purple Top)SARS-COV-2 Vaccination 05/22/2019, 06/15/2019, 01/01/2020   Pneumococcal Conjugate-13 10/16/2019   Pneumococcal Polysaccharide-23 05/09/2010, 04/17/2017   Td 11/16/2004   Tdap 04/17/2016   Zoster, Live 04/15/2015    TDAP status: Up to date  Flu Vaccine status: Up to date  Pneumococcal vaccine status: Up to date  Covid-19 vaccine status: Completed vaccines  Qualifies for Shingles Vaccine? Yes   Zostavax completed Yes   Shingrix Completed?: No.    Education has been provided regarding the importance of this vaccine. Patient has been advised to call insurance company to determine out of pocket expense if they have not yet received this vaccine. Advised may also receive vaccine at local pharmacy or Health Dept. Verbalized acceptance and understanding.  Screening Tests Health Maintenance  Topic Date Due   Zoster Vaccines- Shingrix (1 of 2) Never done   COVID-19 Vaccine (4 - Booster for Pfizer series) 02/26/2020   OPHTHALMOLOGY EXAM  05/10/2020   MAMMOGRAM  10/19/2020   FOOT EXAM  04/20/2021   HEMOGLOBIN A1C  07/10/2021   COLONOSCOPY (Pts 45-14yrs Insurance coverage will need to be confirmed)  02/11/2022   Pneumonia Vaccine 35+ Years old (3) 04/17/2022   TETANUS/TDAP  04/17/2026   INFLUENZA VACCINE  Completed   DEXA SCAN  Completed   Hepatitis C Screening  Completed   HPV VACCINES  Aged Out    Health Maintenance  Health Maintenance Due  Topic Date  Due   Zoster Vaccines- Shingrix (1 of 2) Never done   COVID-19 Vaccine (4 - Booster for Pfizer series) 02/26/2020   OPHTHALMOLOGY EXAM  05/10/2020   MAMMOGRAM  10/19/2020   FOOT EXAM  04/20/2021    Colorectal cancer screening: Type of screening: Colonoscopy. Completed 02/12/2019. Repeat every 3 years  Mammogram status: Completed 10/20/2018. Repeat every year  Bone Density status: last done 05/05/2010 by Endocrinology  Lung Cancer Screening: (Low Dose CT Chest recommended if Age 79-80 years, 30 pack-year currently smoking OR have quit w/in 15years.) does not qualify.   Lung Cancer Screening Referral: no  Additional Screening:  Hepatitis C Screening: does qualify; Completed yes  Vision Screening: Recommended annual ophthalmology exams for early detection of glaucoma and other disorders of the eye. Is the patient up to date with their annual eye exam?  Yes  Who is the provider or what is the name of the office in which the patient attends annual eye exams? Dr. Willow Ora If pt is not established  with a provider, would they like to be referred to a provider to establish care? No .   Dental Screening: Recommended annual dental exams for proper oral hygiene  Community Resource Referral / Chronic Care Management: CRR required this visit?  No   CCM required this visit?  No      Plan:     I have personally reviewed and noted the following in the patients chart:   Medical and social history Use of alcohol, tobacco or illicit drugs  Current medications and supplements including opioid prescriptions.  Functional ability and status Nutritional status Physical activity Advanced directives List of other physicians Hospitalizations, surgeries, and ER visits in previous 12 months Vitals Screenings to include cognitive, depression, and falls Referrals and appointments  In addition, I have reviewed and discussed with patient certain preventive protocols, quality metrics, and best  practice recommendations. A written personalized care plan for preventive services as well as general preventive health recommendations were provided to patient.     Sheral Flow, LPN   8/87/5797   Nurse Notes:  Hearing Screening - Comments:: Patient denied any hearing difficulty.   No hearing aids.  Vision Screening - Comments:: Patient wears corrective glasses/contacts.   Eye exam done annually by: [facility]/Dr. Willow Ora

## 2021-04-22 DIAGNOSIS — H6122 Impacted cerumen, left ear: Secondary | ICD-10-CM | POA: Insufficient documentation

## 2021-04-22 DIAGNOSIS — G4700 Insomnia, unspecified: Secondary | ICD-10-CM | POA: Insufficient documentation

## 2021-04-22 DIAGNOSIS — R519 Headache, unspecified: Secondary | ICD-10-CM | POA: Insufficient documentation

## 2021-04-22 NOTE — Assessment & Plan Note (Signed)
Mild to mod worsening, for ubrelvy prn,  to f/u any worsening symptoms or concerns

## 2021-04-22 NOTE — Assessment & Plan Note (Signed)
Last vitamin D Lab Results  Component Value Date   VD25OH 65.09 04/20/2021   Stable, cont oral replacement  

## 2021-04-22 NOTE — Assessment & Plan Note (Signed)
Resolved with irrigation and hearing improved  Ceruminosis is noted.  Wax is removed by syringing and manual debridement. Instructions for home care to prevent wax buildup are given.  

## 2021-04-22 NOTE — Assessment & Plan Note (Signed)
Lab Results  Component Value Date   VITAMINB12 224 04/20/2021   Low, to start oral replacement - b12 1000 mcg qd

## 2021-04-22 NOTE — Assessment & Plan Note (Signed)
Lab Results  Component Value Date   HGBA1C 6.9 (H) 04/20/2021   Stable, pt to continue current medical treatment farxiga, metformin

## 2021-04-22 NOTE — Assessment & Plan Note (Signed)
Uncontrolled mild recent worsening - for belsomra prn

## 2021-05-08 ENCOUNTER — Telehealth: Payer: Self-pay | Admitting: Internal Medicine

## 2021-05-08 NOTE — Telephone Encounter (Signed)
Pt states her insurance will not cover Suvorexant (BELSOMRA) 10 MG TABS ? ?Ubrogepant (UBRELVY) 100 MG TABS ? ?Pt requesting alternative medication ? ?Inquired if pt asked insurance what are the alternatives, pt states she has not and she did not want to "deal w/ them anymore" ? ?Please advise ?

## 2021-05-12 NOTE — Telephone Encounter (Signed)
PA initiated for Ubrelvy ? ?Key: [id number] ?

## 2021-05-14 ENCOUNTER — Other Ambulatory Visit: Payer: Self-pay | Admitting: Internal Medicine

## 2021-05-14 DIAGNOSIS — N183 Chronic kidney disease, stage 3 unspecified: Secondary | ICD-10-CM

## 2021-05-14 DIAGNOSIS — N1831 Chronic kidney disease, stage 3a: Secondary | ICD-10-CM

## 2021-05-15 NOTE — Telephone Encounter (Signed)
Please refill as per office routine med refill policy (all routine meds to be refilled for 3 mo or monthly (per pt preference) up to one year from last visit, then month to month grace period for 3 mo, then further med refills will have to be denied) ? ?

## 2021-05-17 ENCOUNTER — Encounter: Payer: Self-pay | Admitting: Internal Medicine

## 2021-05-21 MED ORDER — ESZOPICLONE 3 MG PO TABS: 3.0000 mg | ORAL_TABLET | Freq: Every day | ORAL | 1 refills | Status: DC

## 2021-05-21 MED ORDER — SUMATRIPTAN SUCCINATE 100 MG PO TABS: 100.0000 mg | ORAL_TABLET | ORAL | 5 refills | Status: DC | PRN

## 2021-05-22 ENCOUNTER — Telehealth: Payer: Self-pay | Admitting: Internal Medicine

## 2021-05-22 MED ORDER — ESZOPICLONE 2 MG PO TABS: 2.0000 mg | ORAL_TABLET | Freq: Every evening | ORAL | 1 refills | Status: DC | PRN

## 2021-05-22 NOTE — Telephone Encounter (Signed)
Pharmacy notified.

## 2021-05-22 NOTE — Telephone Encounter (Signed)
[facility] calling in ? ?Says they received rx for eszopiclone 3 MG TABS ? ?Pharmacist says she does not feel comfortable prescribing this due to the dosage being too high for patient age ? ?Says she will not feel until she speaks directly with the provider ? ?[id number] [place] [address] ?Phone:  [phone]?Fax:  [phone]? ?

## 2021-05-22 NOTE — Telephone Encounter (Signed)
No need - I changed the dose to 2 mg ?

## 2021-06-09 ENCOUNTER — Other Ambulatory Visit: Payer: Self-pay | Admitting: Internal Medicine

## 2021-06-09 DIAGNOSIS — E119 Type 2 diabetes mellitus without complications: Secondary | ICD-10-CM

## 2021-06-09 DIAGNOSIS — N183 Chronic kidney disease, stage 3 unspecified: Secondary | ICD-10-CM

## 2021-06-09 DIAGNOSIS — N1831 Chronic kidney disease, stage 3a: Secondary | ICD-10-CM

## 2021-06-09 NOTE — Telephone Encounter (Signed)
Please refill as per office routine med refill policy (all routine meds to be refilled for 3 mo or monthly (per pt preference) up to one year from last visit, then month to month grace period for 3 mo, then further med refills will have to be denied) ? ?

## 2021-06-10 ENCOUNTER — Other Ambulatory Visit: Payer: Self-pay | Admitting: Internal Medicine

## 2021-06-30 ENCOUNTER — Other Ambulatory Visit: Payer: Self-pay | Admitting: Internal Medicine

## 2021-08-07 ENCOUNTER — Other Ambulatory Visit: Payer: Self-pay | Admitting: Internal Medicine

## 2021-10-20 ENCOUNTER — Encounter: Payer: Self-pay | Admitting: Internal Medicine

## 2021-10-20 ENCOUNTER — Ambulatory Visit (INDEPENDENT_AMBULATORY_CARE_PROVIDER_SITE_OTHER): Payer: BC Managed Care – PPO | Admitting: Internal Medicine

## 2021-10-20 VITALS — BP 140/72 | HR 93 | Temp 98.2°F | Ht 68.0 in | Wt 224.4 lb

## 2021-10-20 DIAGNOSIS — I10 Essential (primary) hypertension: Secondary | ICD-10-CM

## 2021-10-20 DIAGNOSIS — N1831 Chronic kidney disease, stage 3a: Secondary | ICD-10-CM

## 2021-10-20 DIAGNOSIS — E1165 Type 2 diabetes mellitus with hyperglycemia: Secondary | ICD-10-CM

## 2021-10-20 DIAGNOSIS — E538 Deficiency of other specified B group vitamins: Secondary | ICD-10-CM

## 2021-10-20 DIAGNOSIS — E7800 Pure hypercholesterolemia, unspecified: Secondary | ICD-10-CM

## 2021-10-20 DIAGNOSIS — E559 Vitamin D deficiency, unspecified: Secondary | ICD-10-CM | POA: Diagnosis not present

## 2021-10-20 NOTE — Assessment & Plan Note (Signed)
Last vitamin D Lab Results  Component Value Date   VD25OH 65.09 04/20/2021   Stable, cont oral replacement

## 2021-10-20 NOTE — Progress Notes (Unsigned)
Patient ID: Savannah [name], female   DOB: 1955/08/03, 66 y.o.   MRN: [id number]        Chief Complaint: follow up HTN, HLD and hyperglycemia ***       HPI:  Savannah [name] is a 66 y.o. female here with c/o    New HA and sleeping med working well.        Now s/p recent ESI for the lower back, now improved.  Walking with cane, no falls.  [place] twice last yr but more accidental    Wt Readings from Last 3 Encounters:  10/20/21 224 lb 6.4 oz (101.8 kg)  04/20/21 217 lb 9.6 oz (98.7 kg)  04/20/21 217 lb 9.6 oz (98.7 kg)   BP Readings from Last 3 Encounters:  10/20/21 (!) 140/72  04/20/21 120/60  04/20/21 120/60         Past Medical History:  Diagnosis Date   Allergic rhinitis    Arthritis    arthritis in back   Asthma    B12 deficiency    Cancer ([place]) 2016   kidney    CKD (chronic kidney disease) 04/20/2020   stage 3 per OV note on 04/20/20 from Dr. 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),  clinically  localized w/ negative margins   Hypercholesterolemia    Hypertension    Hypothyroidism    endocrinologist -  dr ellision   Iron deficiency anemia    Lumbar spondylosis    NAFLD (nonalcoholic fatty liver disease)    dx 2010--- last hepatic panel in epic dated 04-17-2017   Nocturia    OA (osteoarthritis of spine)    Lumbar   OSA on CPAP pulmologist-  dr Halford Chessman   sleep study 09-11-2004  very severe osa , AHI 119/hr,  setting 10   Peripheral neuropathy    bottom of feet   Renal calculus, right    Sleep apnea    uses CPAP   Type 2 diabetes mellitus ([place])    endocrinoloigst-  dr Loanne Drilling,  last A1c 5.9 on 02-20-219 in epic   Vitamin B 12 deficiency    Wears glasses    Past Surgical History:  Procedure Laterality Date   BUNIONECTOMY/   HAMMERTOE CORRECTION'S  09-07-2013   SCG   RIGHT FOOT 2ND, 3RD, 4TH, 5TH DIGITS   CARDIAC CATHETERIZATION  06-01-2009  dr Angelena Form   no evidence CAD,  normal LVSF, elevated blood pressure w/ elevated end-diastolic pressure, ef 77-82%   CARDIOVASCULAR STRESS TEST  05-11-2009   dr Angelena Form   abnormal lexiscan nuclear study (no exercise) w/ mild ischemia in the distal anteroseptal wall and apex, this defect may be due to shifting breast attenuation   COLONOSCOPY  last 02/08/2014   COLONOSCOPY WITH SNARE POLYPECTOMY WITH ESOPHAGOGASTRODUODENOSCOPY  02/08/2014   with biopsy Dr. Jerilynn Mages. [facility]   CYSTOSCOPY WITH RETROGRADE PYELOGRAM, URETEROSCOPY AND STENT PLACEMENT Right 06/07/2017   Procedure: CYSTOSCOPY WITH RETROGRADE PYELOGRAM, URETEROSCOPY, LITHOTRIPSY,  AND STENT PLACEMENT 1st STAGE;  Surgeon: Alexis Frock, MD;  Location: [facility];  Service: Urology;  Laterality: Right;   CYSTOSCOPY WITH RETROGRADE PYELOGRAM, URETEROSCOPY AND STENT PLACEMENT Right 06/21/2017   Procedure: CYSTOSCOPY WITH RETROGRADE PYELOGRAM, URETEROSCOPY AND  STENT PLACEMENT 2ND STAGE  STENT" EXCHANGE";  Surgeon: Alexis Frock, MD;  Location: [facility];  Service: Urology;  Laterality: Right;   CYSTOSCOPY WITH RETROGRADE PYELOGRAM, URETEROSCOPY AND STENT PLACEMENT Right 01/13/2021   Procedure: FIRST STAGE CYSTOSCOPY WITH RETROGRADE PYELOGRAM, URETEROSCOPY AND STENT PLACEMENT;  Surgeon: Alexis Frock, MD;  Location: [facility];  Service: Urology;  Laterality: Right;  28 MINS   CYSTOSCOPY WITH RETROGRADE PYELOGRAM, URETEROSCOPY AND STENT PLACEMENT Right 01/27/2021   Procedure: SECOND STAGE CYSTOSCOPY WITH RETROGRADE PYELOGRAM, URETEROSCOPY AND STENT EXCHANGE;  Surgeon: Alexis Frock, MD;  Location: [facility];  Service: Urology;  Laterality: Right;  90 MINS   FRACTURE SURGERY Right    Compound fracture foot   FRACTURE SURGERY Right 1963   Pt had surgery to repair broken arm.   HOLMIUM  LASER APPLICATION Right 00/93/8182   Procedure: HOLMIUM LASER APPLICATION;  Surgeon: Alexis Frock, MD;  Location: [facility];  Service: Urology;  Laterality: Right;   HOLMIUM LASER APPLICATION Right 99/37/1696   Procedure: HOLMIUM LASER APPLICATION;  Surgeon: Alexis Frock, MD;  Location: [facility];  Service: Urology;  Laterality: Right;   HOLMIUM LASER APPLICATION Right 78/93/8101   Procedure: HOLMIUM LASER APPLICATION;  Surgeon: Alexis Frock, MD;  Location: [facility];  Service: Urology;  Laterality: Right;   HOLMIUM LASER APPLICATION Right 75/02/256   Procedure: HOLMIUM LASER APPLICATION;  Surgeon: Alexis Frock, MD;  Location: [facility];  Service: Urology;  Laterality: Right;   KNEE ARTHROSCOPY Left 1993   POLYPECTOMY     ROBOTIC ASSITED PARTIAL NEPHRECTOMY Right 05/19/2014   Procedure: ROBOTIC ASSITED PARTIAL NEPHRECTOMY;  Surgeon: Alexis Frock, MD;  Location: [facility];  Service: Urology;  Laterality: Right;    reports that she quit smoking about 46 years ago. Her smoking use included cigarettes. She has a 0.60 pack-year smoking history. She has never used smokeless tobacco. She reports that she does not drink alcohol and does not use drugs. family history includes Allergies in her sister; Asthma in her sister; Clotting disorder in her grandchild; Colon cancer (age of onset: 64) in her mother; Colon polyps in her mother; Diabetes in her mother; Heart attack in her father; Heart disease in her father and paternal grandmother; Kidney disease in her mother. Allergies  Allergen Reactions   Erythromycin Nausea And Vomiting   Current Outpatient Medications on File Prior to Visit  Medication Sig Dispense Refill   albuterol (PROAIR HFA) 108 (90 Base) MCG/ACT inhaler Inhale 2 puffs into the lungs every 4 (four) hours as needed for wheezing or shortness of breath (cough). 3 each 3   atorvastatin (LIPITOR) 40 MG tablet TAKE 1 TABLET BY MOUTH  EVERY DAY 90 tablet 1   cholecalciferol (VITAMIN D) 1000 units tablet Take 1,000 Units by mouth daily.     citalopram (CELEXA) 20 MG tablet TAKE 1 TABLET BY MOUTH EVERY DAY IN THE MORNING 90 tablet 2   collagenase (SANTYL) ointment Apply 1 application topically daily.     eszopiclone (LUNESTA) 2 MG TABS tablet Take 1 tablet (2 mg total) by mouth at bedtime as needed for sleep. Take immediately before bedtime 90 tablet 1   FARXIGA 5 MG TABS tablet TAKE 1 TABLET BY MOUTH EVERY DAY 90 tablet 3   fluticasone-salmeterol (ADVAIR) 100-50 MCG/ACT AEPB Inhale 1 puff into the lungs 2 (two) times daily. (Patient taking differently: Inhale 1 puff into the lungs daily.) 3 each 3   gabapentin (NEURONTIN) 300 MG capsule TAKE  1 CAPSULE BY MOUTH EVERYDAY AT BEDTIME 90 capsule 1   hyoscyamine (LEVSIN SL) 0.125 MG SL tablet Take 1-2 tablets by mouth or under tongue every 4 hours as needed. 100 tablet 5   levothyroxine (SYNTHROID) 137 MCG tablet TAKE 1 TABLET BY MOUTH EVERY DAY BEFORE BREAKFAST 90 tablet 2   losartan (COZAAR) 100 MG tablet TAKE 1 TABLET BY MOUTH EVERY DAY 90 tablet 3   metFORMIN (GLUCOPHAGE-XR) 500 MG 24 hr tablet TAKE 2 TABLETS BY MOUTH TWICE A DAY 360 tablet 2   metoprolol succinate (TOPROL-XL) 25 MG 24 hr tablet TAKE 1/2 TABLET BY MOUTH EVERY DAY 45 tablet 1   ondansetron (ZOFRAN-ODT) 4 MG disintegrating tablet Take 1 tablet (4 mg total) by mouth every 8 (eight) hours as needed for nausea or vomiting. 20 tablet 0   Oxycodone HCl 10 MG TABS Take 1 tablet (10 mg total) by mouth every 6 (six) hours as needed (breakthrogh pain post-operatively). 10 tablet 0   RESTASIS 0.05 % ophthalmic emulsion Place 1 drop into both eyes 2 (two) times daily.     SUMAtriptan (IMITREX) 100 MG tablet Take 1 tablet (100 mg total) by mouth every 2 (two) hours as needed for migraine or headache. May repeat in 2 hours if headache persists or recurs. 10 tablet 5   No current facility-administered medications on file prior to  visit.        ROS:  All others reviewed and negative.  Objective        PE:  BP (!) 140/72 (BP Location: Left Arm, Patient Position: Sitting, Cuff Size: Normal)   Pulse 93   Temp 98.2 F (36.8 C) (Oral)   Ht '5\' 8"'$  (1.727 m)   Wt 224 lb 6.4 oz (101.8 kg)   SpO2 95%   BMI 34.12 kg/m                 Constitutional: Pt appears in NAD               HENT: Head: NCAT.                Right Ear: External ear normal.                 Left Ear: External ear normal.                Eyes: . Pupils are equal, round, and reactive to light. Conjunctivae and EOM are normal               Nose: without d/c or deformity               Neck: Neck supple. Gross normal ROM               Cardiovascular: Normal rate and regular rhythm.                 Pulmonary/Chest: Effort normal and breath sounds without rales or wheezing.                Abd:  Soft, NT, ND, + BS, no organomegaly               Neurological: Pt is alert. At baseline orientation, motor grossly intact               Skin: Skin is warm. No rashes, no other new lesions, LE edema - ***               Psychiatric: Pt behavior is normal  without agitation   Micro: none  Cardiac tracings I have personally interpreted today:  none  Pertinent Radiological findings (summarize): none   Lab Results  Component Value Date   WBC 7.9 04/20/2021   HGB 13.7 04/20/2021   HCT 40.9 04/20/2021   PLT 192.0 04/20/2021   GLUCOSE 119 (H) 04/20/2021   CHOL 139 04/20/2021   TRIG 202.0 (H) 04/20/2021   HDL 56.10 04/20/2021   LDLDIRECT 58.0 04/20/2021   LDLCALC 53 10/16/2019   ALT 21 04/20/2021   AST 19 04/20/2021   NA 141 04/20/2021   K 4.2 04/20/2021   CL 105 04/20/2021   CREATININE 1.21 (H) 04/20/2021   BUN 23 04/20/2021   CO2 26 04/20/2021   TSH 0.28 (L) 04/20/2021   INR 1.1 ratio (H) 05/23/2009   HGBA1C 6.9 (H) 04/20/2021   MICROALBUR 1.4 04/20/2021   Assessment/Plan:  Savannah [name] is a 66 y.o. White or Caucasian [1] female with  has a  past medical history of Allergic rhinitis, Arthritis, Asthma, B12 deficiency, Cancer ([place]) (2016), CKD (chronic kidney disease) (04/20/2020), Depression, Grade I internal hemorrhoids, Hiatal hernia, History of colon polyps, History of kidney stones, History of renal cell carcinoma (urologist-  dr Tresa Moore), Hypercholesterolemia, Hypertension, Hypothyroidism, Iron deficiency anemia, Lumbar spondylosis, NAFLD (nonalcoholic fatty liver disease), Nocturia, OA (osteoarthritis of spine), OSA on CPAP (pulmologist-  dr Halford Chessman), Peripheral neuropathy, Renal calculus, right, Sleep apnea, Type 2 diabetes mellitus ([place]), Vitamin B 12 deficiency, and Wears glasses.  No problem-specific Assessment & Plan notes found for this encounter.  Followup: No follow-ups on file.  Cathlean Cower, MD 10/20/2021 10:27 AM [facility]

## 2021-10-24 ENCOUNTER — Encounter: Payer: Self-pay | Admitting: Internal Medicine

## 2021-10-24 NOTE — Patient Instructions (Signed)

## 2021-10-24 NOTE — Assessment & Plan Note (Signed)
Lab Results  Component Value Date   LDLCALC 53 10/16/2019   Stable, pt to continue current statin farxiga 5 mg qd, metformin Er 500 mg - 2 bid

## 2021-10-24 NOTE — Assessment & Plan Note (Signed)
Lab Results  Component Value Date   HGBA1C 6.9 (H) 04/20/2021   Stable, pt to continue current medical treatment farxiga 5 mg qd, metformin ER 500 mg - 2 tab bi

## 2021-10-24 NOTE — Assessment & Plan Note (Signed)
Lab Results  Component Value Date   CREATININE 1.21 (H) 04/20/2021   Stable overall, cont to avoid nephrotoxins

## 2021-10-24 NOTE — Assessment & Plan Note (Signed)
Uncontrolled here, pt states controlled at home,  BP Readings from Last 3 Encounters:  10/20/21 (!) 140/72  04/20/21 120/60  04/20/21 120/60   Cont current tx losartan 10 m qd, toprol xl 25 mg qd

## 2021-10-25 ENCOUNTER — Other Ambulatory Visit: Payer: Self-pay | Admitting: Internal Medicine

## 2021-10-25 LAB — CBC WITH DIFFERENTIAL/PLATELET
Basophils Absolute: 0 10*3/uL (ref 0.0–0.1)
Basophils Relative: 0.6 % (ref 0.0–3.0)
Eosinophils Absolute: 0.3 10*3/uL (ref 0.0–0.7)
Eosinophils Relative: 5.1 % — ABNORMAL HIGH (ref 0.0–5.0)
HCT: 40.2 % (ref 36.0–46.0)
Hemoglobin: 13.7 g/dL (ref 12.0–15.0)
Lymphocytes Relative: 23.4 % (ref 12.0–46.0)
Lymphs Abs: 1.5 10*3/uL (ref 0.7–4.0)
MCHC: 33.9 g/dL (ref 30.0–36.0)
MCV: 88.5 fl (ref 78.0–100.0)
Monocytes Absolute: 0.3 10*3/uL (ref 0.1–1.0)
Monocytes Relative: 5.4 % (ref 3.0–12.0)
Neutro Abs: 4.2 10*3/uL (ref 1.4–7.7)
Neutrophils Relative %: 65.5 % (ref 43.0–77.0)
Platelets: 177 10*3/uL (ref 150.0–400.0)
RBC: 4.54 Mil/uL (ref 3.87–5.11)
RDW: 13.5 % (ref 11.5–15.5)
WBC: 6.4 10*3/uL (ref 4.0–10.5)

## 2021-10-25 LAB — URINALYSIS, ROUTINE W REFLEX MICROSCOPIC
Bilirubin Urine: NEGATIVE
Ketones, ur: NEGATIVE
Nitrite: NEGATIVE
Specific Gravity, Urine: 1.015 (ref 1.000–1.030)
Total Protein, Urine: NEGATIVE
Urine Glucose: >=1000 — AB
Urobilinogen, UA: 0.2 (ref 0.0–1.0)
pH: 5.5 (ref 5.0–8.0)

## 2021-10-25 LAB — HEMOGLOBIN A1C: Hgb A1c MFr Bld: 8 % — ABNORMAL HIGH (ref 4.6–6.5)

## 2021-10-25 LAB — HEPATIC FUNCTION PANEL
ALT: 21 U/L (ref 0–35)
AST: 23 U/L (ref 0–37)
Albumin: 4.1 g/dL (ref 3.5–5.2)
Alkaline Phosphatase: 71 U/L (ref 39–117)
Bilirubin, Direct: 0.3 mg/dL (ref 0.0–0.3)
Total Bilirubin: 1.7 mg/dL — ABNORMAL HIGH (ref 0.2–1.2)
Total Protein: 6.8 g/dL (ref 6.0–8.3)

## 2021-10-25 LAB — LIPID PANEL
Cholesterol: 130 mg/dL (ref 0–200)
HDL: 46 mg/dL (ref >39.00)
LDL Cholesterol: 47 mg/dL (ref 0–99)
NonHDL: 83.61
Total CHOL/HDL Ratio: 3
Triglycerides: 181 mg/dL — ABNORMAL HIGH (ref 0.0–149.0)
VLDL: 36.2 mg/dL (ref 0.0–40.0)

## 2021-10-25 LAB — BASIC METABOLIC PANEL
BUN: 14 mg/dL (ref 6–23)
CO2: 26 mEq/L (ref 19–32)
Calcium: 9.2 mg/dL (ref 8.4–10.5)
Chloride: 103 mEq/L (ref 96–112)
Creatinine, Ser: 1.1 mg/dL (ref 0.40–1.20)
GFR: 52.11 mL/min — ABNORMAL LOW (ref >60.00)
Glucose, Bld: 209 mg/dL — ABNORMAL HIGH (ref 70–99)
Potassium: 4.1 mEq/L (ref 3.5–5.1)
Sodium: 139 mEq/L (ref 135–145)

## 2021-10-25 LAB — MICROALBUMIN / CREATININE URINE RATIO
Creatinine,U: 65 mg/dL
Microalb Creat Ratio: 1.4 mg/g (ref 0.0–30.0)
Microalb, Ur: 0.9 mg/dL (ref 0.0–1.9)

## 2021-10-25 LAB — TSH: TSH: 0.34 u[IU]/mL — ABNORMAL LOW (ref 0.35–5.50)

## 2021-10-25 LAB — VITAMIN B12: Vitamin B-12: 915 pg/mL — ABNORMAL HIGH (ref 211–911)

## 2021-10-25 LAB — VITAMIN D 25 HYDROXY (VIT D DEFICIENCY, FRACTURES): VITD: 55.32 ng/mL (ref 30.00–100.00)

## 2021-10-25 MED ORDER — LEVOTHYROXINE SODIUM 125 MCG PO TABS: 125.0000 ug | ORAL_TABLET | Freq: Every day | ORAL | 3 refills | Status: DC

## 2021-10-25 MED ORDER — DAPAGLIFLOZIN PROPANEDIOL 10 MG PO TABS: 10.0000 mg | ORAL_TABLET | Freq: Every day | ORAL | 3 refills | Status: DC

## 2021-10-30 ENCOUNTER — Other Ambulatory Visit: Payer: Self-pay | Admitting: Internal Medicine

## 2021-10-30 DIAGNOSIS — E1165 Type 2 diabetes mellitus with hyperglycemia: Secondary | ICD-10-CM

## 2021-11-14 ENCOUNTER — Other Ambulatory Visit: Payer: Self-pay | Admitting: Internal Medicine

## 2021-12-08 ENCOUNTER — Other Ambulatory Visit: Payer: Self-pay | Admitting: Internal Medicine

## 2021-12-08 DIAGNOSIS — E119 Type 2 diabetes mellitus without complications: Secondary | ICD-10-CM

## 2021-12-08 NOTE — Telephone Encounter (Signed)
Please refill as per office routine med refill policy (all routine meds to be refilled for 3 mo or monthly (per pt preference) up to one year from last visit, then month to month grace period for 3 mo, then further med refills will have to be denied) ? ?

## 2021-12-25 DIAGNOSIS — M5412 Radiculopathy, cervical region: Secondary | ICD-10-CM | POA: Insufficient documentation

## 2021-12-25 DIAGNOSIS — K5903 Drug induced constipation: Secondary | ICD-10-CM | POA: Insufficient documentation

## 2021-12-26 ENCOUNTER — Other Ambulatory Visit: Payer: Self-pay | Admitting: Internal Medicine

## 2021-12-26 NOTE — Telephone Encounter (Signed)
Please refill as per office routine med refill policy (all routine meds to be refilled for 3 mo or monthly (per pt preference) up to one year from last visit, then month to month grace period for 3 mo, then further med refills will have to be denied) ? ?

## 2022-01-23 ENCOUNTER — Encounter: Payer: Self-pay | Admitting: Gastroenterology

## 2022-01-24 ENCOUNTER — Other Ambulatory Visit: Payer: Self-pay | Admitting: Internal Medicine

## 2022-01-24 ENCOUNTER — Ambulatory Visit (INDEPENDENT_AMBULATORY_CARE_PROVIDER_SITE_OTHER): Payer: Medicare Other

## 2022-01-24 DIAGNOSIS — Z23 Encounter for immunization: Secondary | ICD-10-CM

## 2022-01-24 NOTE — Progress Notes (Signed)
After obtaining consent, and per orders of Dr. Jenny Reichmann, injection of High Flu shot was given in the left deltoid by Marrian Salvage. Patient instructed to report any adverse reaction to me immediately.

## 2022-04-19 ENCOUNTER — Other Ambulatory Visit: Payer: Self-pay | Admitting: Internal Medicine

## 2022-04-19 ENCOUNTER — Ambulatory Visit (INDEPENDENT_AMBULATORY_CARE_PROVIDER_SITE_OTHER): Payer: BC Managed Care – PPO | Admitting: Internal Medicine

## 2022-04-19 VITALS — BP 132/80 | HR 83 | Temp 98.0°F | Ht 68.0 in | Wt 224.0 lb

## 2022-04-19 DIAGNOSIS — E1165 Type 2 diabetes mellitus with hyperglycemia: Secondary | ICD-10-CM | POA: Diagnosis not present

## 2022-04-19 DIAGNOSIS — H9202 Otalgia, left ear: Secondary | ICD-10-CM | POA: Diagnosis not present

## 2022-04-19 DIAGNOSIS — E538 Deficiency of other specified B group vitamins: Secondary | ICD-10-CM

## 2022-04-19 DIAGNOSIS — E7800 Pure hypercholesterolemia, unspecified: Secondary | ICD-10-CM

## 2022-04-19 DIAGNOSIS — I10 Essential (primary) hypertension: Secondary | ICD-10-CM | POA: Diagnosis not present

## 2022-04-19 DIAGNOSIS — E559 Vitamin D deficiency, unspecified: Secondary | ICD-10-CM

## 2022-04-19 DIAGNOSIS — E039 Hypothyroidism, unspecified: Secondary | ICD-10-CM

## 2022-04-19 LAB — LIPID PANEL
Cholesterol: 118 mg/dL (ref 0–200)
HDL: 39.4 mg/dL (ref >39.00)
LDL Cholesterol: 44 mg/dL (ref 0–99)
NonHDL: 78.82
Total CHOL/HDL Ratio: 3
Triglycerides: 175 mg/dL — ABNORMAL HIGH (ref 0.0–149.0)
VLDL: 35 mg/dL (ref 0.0–40.0)

## 2022-04-19 LAB — CBC WITH DIFFERENTIAL/PLATELET
Basophils Absolute: 0 10*3/uL (ref 0.0–0.1)
Basophils Relative: 0.5 % (ref 0.0–3.0)
Eosinophils Absolute: 0.4 10*3/uL (ref 0.0–0.7)
Eosinophils Relative: 5.5 % — ABNORMAL HIGH (ref 0.0–5.0)
HCT: 40 % (ref 36.0–46.0)
Hemoglobin: 13.5 g/dL (ref 12.0–15.0)
Lymphocytes Relative: 30.3 % (ref 12.0–46.0)
Lymphs Abs: 2.1 10*3/uL (ref 0.7–4.0)
MCHC: 33.9 g/dL (ref 30.0–36.0)
MCV: 88 fl (ref 78.0–100.0)
Monocytes Absolute: 0.3 10*3/uL (ref 0.1–1.0)
Monocytes Relative: 4.8 % (ref 3.0–12.0)
Neutro Abs: 4.1 10*3/uL (ref 1.4–7.7)
Neutrophils Relative %: 58.9 % (ref 43.0–77.0)
Platelets: 209 10*3/uL (ref 150.0–400.0)
RBC: 4.54 Mil/uL (ref 3.87–5.11)
RDW: 13.5 % (ref 11.5–15.5)
WBC: 7 10*3/uL (ref 4.0–10.5)

## 2022-04-19 LAB — URINALYSIS, ROUTINE W REFLEX MICROSCOPIC
Bilirubin Urine: NEGATIVE
Ketones, ur: NEGATIVE
Nitrite: NEGATIVE
Specific Gravity, Urine: 1.015 (ref 1.000–1.030)
Total Protein, Urine: NEGATIVE
Urine Glucose: >=1000 — AB
Urobilinogen, UA: 0.2 (ref 0.0–1.0)
pH: 5.5 (ref 5.0–8.0)

## 2022-04-19 LAB — T4, FREE: Free T4: 1.1 ng/dL (ref 0.60–1.60)

## 2022-04-19 LAB — VITAMIN D 25 HYDROXY (VIT D DEFICIENCY, FRACTURES): VITD: 47.23 ng/mL (ref 30.00–100.00)

## 2022-04-19 LAB — MICROALBUMIN / CREATININE URINE RATIO
Creatinine,U: 86.7 mg/dL
Microalb Creat Ratio: 3.2 mg/g (ref 0.0–30.0)
Microalb, Ur: 2.7 mg/dL — ABNORMAL HIGH (ref 0.0–1.9)

## 2022-04-19 LAB — HEPATIC FUNCTION PANEL
ALT: 15 U/L (ref 0–35)
AST: 20 U/L (ref 0–37)
Albumin: 4.3 g/dL (ref 3.5–5.2)
Alkaline Phosphatase: 67 U/L (ref 39–117)
Bilirubin, Direct: 0.2 mg/dL (ref 0.0–0.3)
Total Bilirubin: 1.1 mg/dL (ref 0.2–1.2)
Total Protein: 7 g/dL (ref 6.0–8.3)

## 2022-04-19 LAB — BASIC METABOLIC PANEL
BUN: 17 mg/dL (ref 6–23)
CO2: 26 mEq/L (ref 19–32)
Calcium: 9.2 mg/dL (ref 8.4–10.5)
Chloride: 104 mEq/L (ref 96–112)
Creatinine, Ser: 1.12 mg/dL (ref 0.40–1.20)
GFR: 50.82 mL/min — ABNORMAL LOW (ref >60.00)
Glucose, Bld: 149 mg/dL — ABNORMAL HIGH (ref 70–99)
Potassium: 4 mEq/L (ref 3.5–5.1)
Sodium: 139 mEq/L (ref 135–145)

## 2022-04-19 LAB — HEMOGLOBIN A1C: Hgb A1c MFr Bld: 7.7 % — ABNORMAL HIGH (ref 4.6–6.5)

## 2022-04-19 LAB — VITAMIN B12: Vitamin B-12: 672 pg/mL (ref 211–911)

## 2022-04-19 LAB — TSH: TSH: 3.92 u[IU]/mL (ref 0.35–5.50)

## 2022-04-19 MED ORDER — CEPHALEXIN 500 MG PO CAPS: 500.0000 mg | ORAL_CAPSULE | Freq: Three times a day (TID) | ORAL | 0 refills | Status: DC

## 2022-04-19 NOTE — Progress Notes (Signed)
Patient ID: Savannah [name], female   DOB: [birth year], 67 y.o.   MRN: [id number]        Chief Complaint: follow up recent cough and ear pain, low thyroid, dm, low B12, htn, hld, low vit d       HPI:  Savannah [name] is a 67 y.o. female here with c/o 1 wk acute bronchitis symptoms with fatigue and scant prod cough, feverish now mostly resolved except for left ear pain. Pt denies chest pain, increased sob or doe, wheezing, orthopnea, PND, increased LE swelling, palpitations, dizziness or syncope.   Pt denies polydipsia, polyuria, or new focal neuro s/s.   Tolerating reduced levothyroxine to 125 mcg qd, and farxiga increased to 10 mg  Sched for eye exam next mo.  Due for colonoscopy Wt Readings from Last 3 Encounters:  04/19/22 224 lb (101.6 kg)  10/20/21 224 lb 6.4 oz (101.8 kg)  04/20/21 217 lb 9.6 oz (98.7 kg)   BP Readings from Last 3 Encounters:  04/19/22 132/80  10/20/21 (!) 140/72  04/20/21 120/60         Past Medical History:  Diagnosis Date   Allergic rhinitis    Arthritis    arthritis in back   Asthma    B12 deficiency    Cancer ([place]) 2016   kidney    CKD (chronic kidney disease) 04/20/2020   stage 3 per OV note on 04/20/20 from Dr. 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),  clinically  localized w/ negative margins   Hypercholesterolemia    Hypertension    Hypothyroidism    endocrinologist -  dr ellision   Iron deficiency anemia    Lumbar spondylosis    NAFLD (nonalcoholic fatty liver disease)    dx 2010--- last hepatic panel in epic dated 04-17-2017   Nocturia    OA (osteoarthritis of spine)    Lumbar   OSA on CPAP pulmologist-  dr Halford Chessman   sleep study 09-11-2004  very severe osa , AHI 119/hr,  setting 10   Peripheral neuropathy     bottom of feet   Renal calculus, right    Sleep apnea    uses CPAP   Type 2 diabetes mellitus ([place])    endocrinoloigst-  dr Loanne Drilling,  last A1c 5.9 on 02-20-219 in epic   Vitamin B 12 deficiency    Wears glasses    Past Surgical History:  Procedure Laterality Date   BUNIONECTOMY/  HAMMERTOE CORRECTION'S  09-07-2013   SCG   RIGHT FOOT 2ND, 3RD, 4TH, 5TH DIGITS   CARDIAC CATHETERIZATION  06-01-2009  dr Angelena Form   no evidence CAD,  normal LVSF, elevated blood pressure w/ elevated end-diastolic pressure, ef [id number]   CARDIOVASCULAR STRESS TEST  05-11-2009   dr Angelena Form   abnormal lexiscan nuclear study (no exercise) w/ mild ischemia in the distal anteroseptal wall and apex, this defect may be due to shifting breast attenuation   COLONOSCOPY  last 02/08/2014   COLONOSCOPY WITH SNARE POLYPECTOMY WITH ESOPHAGOGASTRODUODENOSCOPY  02/08/2014   with biopsy Dr. Jerilynn Mages. Fuller Plan   CYSTOSCOPY WITH RETROGRADE PYELOGRAM, URETEROSCOPY AND STENT PLACEMENT Right 06/07/2017   Procedure: CYSTOSCOPY WITH RETROGRADE PYELOGRAM, URETEROSCOPY, LITHOTRIPSY,  AND STENT PLACEMENT 1st  STAGE;  Surgeon: Alexis Frock, MD;  Location: [facility];  Service: Urology;  Laterality: Right;   CYSTOSCOPY WITH RETROGRADE PYELOGRAM, URETEROSCOPY AND STENT PLACEMENT Right 06/21/2017   Procedure: CYSTOSCOPY WITH RETROGRADE PYELOGRAM, URETEROSCOPY AND STENT PLACEMENT 2ND STAGE  STENT" EXCHANGE";  Surgeon: Alexis Frock, MD;  Location: [facility];  Service: Urology;  Laterality: Right;   CYSTOSCOPY WITH RETROGRADE PYELOGRAM, URETEROSCOPY AND STENT PLACEMENT Right 01/13/2021   Procedure: FIRST STAGE CYSTOSCOPY WITH RETROGRADE PYELOGRAM, URETEROSCOPY AND STENT PLACEMENT;  Surgeon: Alexis Frock, MD;  Location: [facility];  Service: Urology;  Laterality: Right;  71 MINS   CYSTOSCOPY WITH RETROGRADE PYELOGRAM, URETEROSCOPY AND STENT PLACEMENT Right 01/27/2021   Procedure: SECOND STAGE CYSTOSCOPY  WITH RETROGRADE PYELOGRAM, URETEROSCOPY AND STENT EXCHANGE;  Surgeon: Alexis Frock, MD;  Location: [facility];  Service: Urology;  Laterality: Right;  90 MINS   FRACTURE SURGERY Right    Compound fracture foot   FRACTURE SURGERY Right 1963   Pt had surgery to repair broken arm.   HOLMIUM LASER APPLICATION Right [id number]   Procedure: HOLMIUM LASER APPLICATION;  Surgeon: Alexis Frock, MD;  Location: [facility];  Service: Urology;  Laterality: Right;   HOLMIUM LASER APPLICATION Right [id number]   Procedure: HOLMIUM LASER APPLICATION;  Surgeon: Alexis Frock, MD;  Location: [facility];  Service: Urology;  Laterality: Right;   HOLMIUM LASER APPLICATION Right [id number]   Procedure: HOLMIUM LASER APPLICATION;  Surgeon: Alexis Frock, MD;  Location: [facility];  Service: Urology;  Laterality: Right;   HOLMIUM LASER APPLICATION Right [id number]   Procedure: HOLMIUM LASER APPLICATION;  Surgeon: Alexis Frock, MD;  Location: [facility];  Service: Urology;  Laterality: Right;   KNEE ARTHROSCOPY Left 1993   POLYPECTOMY     ROBOTIC ASSITED PARTIAL NEPHRECTOMY Right 05/19/2014   Procedure: ROBOTIC ASSITED PARTIAL NEPHRECTOMY;  Surgeon: Alexis Frock, MD;  Location: [facility];  Service: Urology;  Laterality: Right;    reports that she quit smoking about 47 years ago. Her smoking use included cigarettes. She has a 0.60 pack-year smoking history. She has never used smokeless tobacco. She reports that she does not drink alcohol and does not use drugs. family history includes Allergies in her sister; Asthma in her sister; Clotting disorder in her grandchild; Colon cancer (age of onset: 65) in her mother; Colon polyps in her mother; Diabetes in her mother; Heart attack in her father; Heart disease in her father and paternal grandmother; Kidney disease in her mother. Allergies  Allergen Reactions   Erythromycin Nausea And Vomiting   Current Outpatient Medications  on File Prior to Visit  Medication Sig Dispense Refill   albuterol (PROAIR HFA) 108 (90 Base) MCG/ACT inhaler Inhale 2 puffs into the lungs every 4 (four) hours as needed for wheezing or shortness of breath (cough). 3 each 3   atorvastatin (LIPITOR) 40 MG tablet TAKE 1 TABLET BY MOUTH EVERY DAY 90 tablet 1   cholecalciferol (VITAMIN D) 1000 units tablet Take 1,000 Units by mouth daily.     citalopram (CELEXA) 20 MG tablet TAKE 1 TABLET BY MOUTH EVERY DAY IN THE MORNING 90 tablet 2   collagenase (SANTYL) ointment Apply 1 application topically daily.     dapagliflozin propanediol (FARXIGA) 10 MG TABS tablet Take 1 tablet (10 mg total) by mouth daily before breakfast. 90 tablet 3   eszopiclone (LUNESTA) 2 MG TABS tablet TAKE 1 TABLET BY MOUTH IMMEDIATELY BEFORE BEDTIME AS NEEDED FOR SLEEP 90 tablet  1   fluticasone-salmeterol (ADVAIR) 100-50 MCG/ACT AEPB Inhale 1 puff into the lungs 2 (two) times daily. (Patient taking differently: Inhale 1 puff into the lungs daily.) 3 each 3   gabapentin (NEURONTIN) 300 MG capsule TAKE 1 CAPSULE BY MOUTH EVERYDAY AT BEDTIME 90 capsule 1   hyoscyamine (LEVSIN SL) 0.125 MG SL tablet Take 1-2 tablets by mouth or under tongue every 4 hours as needed. 100 tablet 5   levothyroxine (SYNTHROID) 125 MCG tablet Take 1 tablet (125 mcg total) by mouth daily. 90 tablet 3   losartan (COZAAR) 100 MG tablet TAKE 1 TABLET BY MOUTH EVERY DAY 90 tablet 3   losartan-hydrochlorothiazide (HYZAAR) 100-12.5 MG tablet Take 1 tablet every day by oral route.     metFORMIN (GLUCOPHAGE-XR) 500 MG 24 hr tablet TAKE 2 TABLETS BY MOUTH TWICE A DAY 360 tablet 2   metoprolol succinate (TOPROL-XL) 25 MG 24 hr tablet TAKE 1/2 TABLET BY MOUTH EVERY DAY 45 tablet 1   ondansetron (ZOFRAN-ODT) 4 MG disintegrating tablet Take 1 tablet (4 mg total) by mouth every 8 (eight) hours as needed for nausea or vomiting. 20 tablet 0   Oxycodone HCl 10 MG TABS Take 1 tablet (10 mg total) by mouth every 6 (six) hours  as needed (breakthrogh pain post-operatively). 10 tablet 0   RESTASIS 0.05 % ophthalmic emulsion Place 1 drop into both eyes 2 (two) times daily.     SUMAtriptan (IMITREX) 100 MG tablet Take 1 tablet (100 mg total) by mouth every 2 (two) hours as needed for migraine or headache. May repeat in 2 hours if headache persists or recurs. 10 tablet 5   No current facility-administered medications on file prior to visit.        ROS:  All others reviewed and negative.  Objective        PE:  BP 132/80 (BP Location: Right Arm, Patient Position: Sitting, Cuff Size: Large)   Pulse 83   Temp 98 F (36.7 C) (Oral)   Ht '5\' 8"'$  (1.727 m)   Wt 224 lb (101.6 kg)   SpO2 96%   BMI 34.06 kg/m                 Constitutional: Pt appears in NAD               HENT: Head: NCAT.                Right Ear: External ear normal.                 Left Ear: External ear normal. Left TM with mild erythema               Eyes: . Pupils are equal, round, and reactive to light. Conjunctivae and EOM are normal               Nose: without d/c or deformity               Neck: Neck supple. Gross normal ROM               Cardiovascular: Normal rate and regular rhythm.                 Pulmonary/Chest: Effort normal and breath sounds without rales or wheezing.                               Neurological: Pt is alert. At baseline orientation, motor  grossly intact               Skin: Skin is warm. No rashes, no other new lesions, LE edema - none               Psychiatric: Pt behavior is normal without agitation   Micro: none  Cardiac tracings I have personally interpreted today:  none  Pertinent Radiological findings (summarize): none   Lab Results  Component Value Date   WBC 7.0 04/19/2022   HGB 13.5 04/19/2022   HCT 40.0 04/19/2022   PLT 209.0 04/19/2022   GLUCOSE 149 (H) 04/19/2022   CHOL 118 04/19/2022   TRIG 175.0 (H) 04/19/2022   HDL 39.40 04/19/2022   LDLDIRECT 58.0 04/20/2021   LDLCALC 44 04/19/2022   ALT  15 04/19/2022   AST 20 04/19/2022   NA 139 04/19/2022   K 4.0 04/19/2022   CL 104 04/19/2022   CREATININE 1.12 04/19/2022   BUN 17 04/19/2022   CO2 26 04/19/2022   TSH 3.92 04/19/2022   INR 1.1 ratio (H) 05/23/2009   HGBA1C 7.7 (H) 04/19/2022   MICROALBUR 2.7 (H) 04/19/2022   Assessment/Plan:  CYTHIA BIGBY is a 67 y.o. White or Caucasian [1] female with  has a past medical history of Allergic rhinitis, Arthritis, Asthma, B12 deficiency, Cancer ([place]) (2016), CKD (chronic kidney disease) (04/20/2020), Depression, Grade I internal hemorrhoids, Hiatal hernia, History of colon polyps, History of kidney stones, History of renal cell carcinoma (urologist-  dr Tresa Moore), Hypercholesterolemia, Hypertension, Hypothyroidism, Iron deficiency anemia, Lumbar spondylosis, NAFLD (nonalcoholic fatty liver disease), Nocturia, OA (osteoarthritis of spine), OSA on CPAP (pulmologist-  dr Halford Chessman), Peripheral neuropathy, Renal calculus, right, Sleep apnea, Type 2 diabetes mellitus ([place]), Vitamin B 12 deficiency, and Wears glasses.  Left ear pain Improving s/p uri - for mucinex bid prn  B12 deficiency Lab Results  Component Value Date   VITAMINB12 672 04/19/2022   Stable, cont oral replacement - b12 1000 mcg qd   Diabetes ([facility]) Lab Results  Component Value Date   HGBA1C 7.7 (H) 04/19/2022   Uncontrolled but improved, pt to continue current medical treatment farxiga 10 qd, and metformin ER 500 mg - 2 bid, declines other change today  Current Outpatient Medications (Endocrine & Metabolic):    dapagliflozin propanediol (FARXIGA) 10 MG TABS tablet, Take 1 tablet (10 mg total) by mouth daily before breakfast.   levothyroxine (SYNTHROID) 125 MCG tablet, Take 1 tablet (125 mcg total) by mouth daily.   metFORMIN (GLUCOPHAGE-XR) 500 MG 24 hr tablet, TAKE 2 TABLETS BY MOUTH TWICE A DAY  Current Outpatient Medications (Cardiovascular):    atorvastatin (LIPITOR) 40 MG tablet, TAKE 1 TABLET BY MOUTH EVERY DAY    losartan (COZAAR) 100 MG tablet, TAKE 1 TABLET BY MOUTH EVERY DAY   losartan-hydrochlorothiazide (HYZAAR) 100-12.5 MG tablet, Take 1 tablet every day by oral route.   metoprolol succinate (TOPROL-XL) 25 MG 24 hr tablet, TAKE 1/2 TABLET BY MOUTH EVERY DAY  Current Outpatient Medications (Respiratory):    albuterol (PROAIR HFA) 108 (90 Base) MCG/ACT inhaler, Inhale 2 puffs into the lungs every 4 (four) hours as needed for wheezing or shortness of breath (cough).   fluticasone-salmeterol (ADVAIR) 100-50 MCG/ACT AEPB, Inhale 1 puff into the lungs 2 (two) times daily. (Patient taking differently: Inhale 1 puff into the lungs daily.)  Current Outpatient Medications (Analgesics):    Oxycodone HCl 10 MG TABS, Take 1 tablet (10 mg total) by mouth every 6 (six) hours as needed (breakthrogh pain  post-operatively).   SUMAtriptan (IMITREX) 100 MG tablet, Take 1 tablet (100 mg total) by mouth every 2 (two) hours as needed for migraine or headache. May repeat in 2 hours if headache persists or recurs.   Current Outpatient Medications (Other):    cephALEXin (KEFLEX) 500 MG capsule, Take 1 capsule (500 mg total) by mouth 3 (three) times daily.   cholecalciferol (VITAMIN D) 1000 units tablet, Take 1,000 Units by mouth daily.   citalopram (CELEXA) 20 MG tablet, TAKE 1 TABLET BY MOUTH EVERY DAY IN THE MORNING   collagenase (SANTYL) ointment, Apply 1 application topically daily.   eszopiclone (LUNESTA) 2 MG TABS tablet, TAKE 1 TABLET BY MOUTH IMMEDIATELY BEFORE BEDTIME AS NEEDED FOR SLEEP   gabapentin (NEURONTIN) 300 MG capsule, TAKE 1 CAPSULE BY MOUTH EVERYDAY AT BEDTIME   hyoscyamine (LEVSIN SL) 0.125 MG SL tablet, Take 1-2 tablets by mouth or under tongue every 4 hours as needed.   ondansetron (ZOFRAN-ODT) 4 MG disintegrating tablet, Take 1 tablet (4 mg total) by mouth every 8 (eight) hours as needed for nausea or vomiting.   RESTASIS 0.05 % ophthalmic emulsion, Place 1 drop into both eyes 2 (two) times  daily.   Essential hypertension BP Readings from Last 3 Encounters:  04/19/22 132/80  10/20/21 (!) 140/72  04/20/21 120/60   Stable, pt to continue medical treatment hyzaar 100 12.5qd, toprol xl 25 qd   HYPERCHOLESTEROLEMIA Lab Results  Component Value Date   LDLCALC 44 04/19/2022   Stable, pt to continue current statin lipitor 40 mg qd   Hypothyroidism Lab Results  Component Value Date   TSH 3.92 04/19/2022   Stable, pt to continue levothyroxine 125 mcg qd   Vitamin D deficiency Last vitamin D Lab Results  Component Value Date   VD25OH 47.23 04/19/2022   Stable, cont oral replacement  Followup: Return in about 6 months (around 10/18/2022).  Cathlean Cower, MD 04/25/2022 6:01 PM [facility]

## 2022-04-19 NOTE — Patient Instructions (Signed)
Please remember to call for your follow up colonoscopy with Dr Fuller Plan.    Please have your Shingrix (shingles) shots done at your local pharmacy.  You can also take Delsym OTC for cough, and/or Mucinex (or it's generic off brand) for congestion, and tylenol as needed for pain.  Please continue all other medications as before, and refills have been done if requested.  Please have the pharmacy call with any other refills you may need.  Please continue your efforts at being more active, low cholesterol diet, and weight control.  You are otherwise up to date with prevention measures today.  Please keep your appointments with your specialists as you may have planned  Please go to the LAB at the blood drawing area for the tests to be done  You will be contacted by phone if any changes need to be made immediately.  Otherwise, you will receive a letter about your results with an explanation, but please check with MyChart first.  Please remember to sign up for MyChart if you have not done so, as this will be important to you in the future with finding out test results, communicating by private email, and scheduling acute appointments online when needed.  Please make an Appointment to return in 6 months, or sooner if needed

## 2022-04-20 ENCOUNTER — Ambulatory Visit: Payer: BC Managed Care – PPO | Admitting: Internal Medicine

## 2022-04-23 ENCOUNTER — Ambulatory Visit (INDEPENDENT_AMBULATORY_CARE_PROVIDER_SITE_OTHER): Payer: BC Managed Care – PPO

## 2022-04-23 DIAGNOSIS — Z0000 Encounter for general adult medical examination without abnormal findings: Secondary | ICD-10-CM

## 2022-04-23 NOTE — Patient Instructions (Signed)
Savannah [name] , Thank you for taking time to come for your Medicare Wellness Visit. I appreciate your ongoing commitment to your health goals. Please review the following plan we discussed and let me know if I can assist you in the future.   These are the goals we discussed:  Goals      Client will verbalize knowledge of diabetes self-management as evidenced by Hgb A1C <7 or as defined by provider.            This is a list of the screening recommended for you and due dates:  Health Maintenance  Topic Date Due   Mammogram  10/19/2020   COVID-19 Vaccine (4 - 2023-24 season) 05/05/2022*   Eye exam for diabetics  05/28/2022*   Zoster (Shingles) Vaccine (1 of 2) 07/18/2022*   Colon Cancer Screening  04/20/2023*   Hemoglobin A1C  10/18/2022   Yearly kidney function blood test for diabetes  04/20/2023   Yearly kidney health urinalysis for diabetes  04/20/2023   Complete foot exam   04/20/2023   Medicare Annual Wellness Visit  04/24/2023   Pneumonia Vaccine (3 of 3 - PPSV23 or PCV20) 10/15/2024   DTaP/Tdap/Td vaccine (3 - Td or Tdap) 04/17/2026   Flu Shot  Completed   DEXA scan (bone density measurement)  Completed   Hepatitis C Screening: USPSTF Recommendation to screen - Ages 41-79 yo.  Completed   HPV Vaccine  Aged Out  *Topic was postponed. The date shown is not the original due date.    Advanced directives: Yes  Conditions/risks identified: Yes  Next appointment: Follow up in one year for your annual wellness visit.   Preventive Care 2 Years and Older, Female Preventive care refers to lifestyle choices and visits with your health care provider that can promote health and wellness. What does preventive care include? A yearly physical exam. This is also called an annual well check. Dental exams once or twice a year. Routine eye exams. Ask your health care provider how often you should have your eyes checked. Personal lifestyle choices, including: Daily care of your teeth and  gums. Regular physical activity. Eating a healthy diet. Avoiding tobacco and drug use. Limiting alcohol use. Practicing safe sex. Taking low-dose aspirin every day. Taking vitamin and mineral supplements as recommended by your health care provider. What happens during an annual well check? The services and screenings done by your health care provider during your annual well check will depend on your age, overall health, lifestyle risk factors, and family history of disease. Counseling  Your health care provider may ask you questions about your: Alcohol use. Tobacco use. Drug use. Emotional well-being. Home and relationship well-being. Sexual activity. Eating habits. History of falls. Memory and ability to understand (cognition). Work and work Statistician. Reproductive health. Screening  You may have the following tests or measurements: Height, weight, and BMI. Blood pressure. Lipid and cholesterol levels. These may be checked every 5 years, or more frequently if you are over 35 years old. Skin check. Lung cancer screening. You may have this screening every year starting at age 31 if you have a 30-pack-year history of smoking and currently smoke or have quit within the past 15 years. Fecal occult blood test (FOBT) of the stool. You may have this test every year starting at age 85. Flexible sigmoidoscopy or colonoscopy. You may have a sigmoidoscopy every 5 years or a colonoscopy every 10 years starting at age 19. Hepatitis C blood test. Hepatitis B blood test. Sexually  transmitted disease (STD) testing. Diabetes screening. This is done by checking your blood sugar (glucose) after you have not eaten for a while (fasting). You may have this done every 1-3 years. Bone density scan. This is done to screen for osteoporosis. You may have this done starting at age 79. Mammogram. This may be done every 1-2 years. Talk to your health care provider about how often you should have regular  mammograms. Talk with your health care provider about your test results, treatment options, and if necessary, the need for more tests. Vaccines  Your health care provider may recommend certain vaccines, such as: Influenza vaccine. This is recommended every year. Tetanus, diphtheria, and acellular pertussis (Tdap, Td) vaccine. You may need a Td booster every 10 years. Zoster vaccine. You may need this after age 33. Pneumococcal 13-valent conjugate (PCV13) vaccine. One dose is recommended after age 69. Pneumococcal polysaccharide (PPSV23) vaccine. One dose is recommended after age 18. Talk to your health care provider about which screenings and vaccines you need and how often you need them. This information is not intended to replace advice given to you by your health care provider. Make sure you discuss any questions you have with your health care provider. Document Released: 03/11/2015 Document Revised: 11/02/2015 Document Reviewed: 12/14/2014 Elsevier Interactive Patient Education  2017 [place] Prevention in the Home Falls can cause injuries. They can happen to people of all ages. There are many things you can do to make your home safe and to help prevent falls. What can I do on the outside of my home? Regularly fix the edges of walkways and driveways and fix any cracks. Remove anything that might make you trip as you walk through a door, such as a raised step or threshold. Trim any bushes or trees on the path to your home. Use bright outdoor lighting. Clear any walking paths of anything that might make someone trip, such as rocks or tools. Regularly check to see if handrails are loose or broken. Make sure that both sides of any steps have handrails. Any raised decks and porches should have guardrails on the edges. Have any leaves, snow, or ice cleared regularly. Use sand or salt on walking paths during winter. Clean up any spills in your garage right away. This includes oil  or grease spills. What can I do in the bathroom? Use night lights. Install grab bars by the toilet and in the tub and shower. Do not use towel bars as grab bars. Use non-skid mats or decals in the tub or shower. If you need to sit down in the shower, use a plastic, non-slip stool. Keep the floor dry. Clean up any water that spills on the floor as soon as it happens. Remove soap buildup in the tub or shower regularly. Attach bath mats securely with double-sided non-slip rug tape. Do not have throw rugs and other things on the floor that can make you trip. What can I do in the bedroom? Use night lights. Make sure that you have a light by your bed that is easy to reach. Do not use any sheets or blankets that are too big for your bed. They should not hang down onto the floor. Have a firm chair that has side arms. You can use this for support while you get dressed. Do not have throw rugs and other things on the floor that can make you trip. What can I do in the kitchen? Clean up any spills right away. Avoid  walking on wet floors. Keep items that you use a lot in easy-to-reach places. If you need to reach something above you, use a strong step stool that has a grab bar. Keep electrical cords out of the way. Do not use floor polish or wax that makes floors slippery. If you must use wax, use non-skid floor wax. Do not have throw rugs and other things on the floor that can make you trip. What can I do with my stairs? Do not leave any items on the stairs. Make sure that there are handrails on both sides of the stairs and use them. Fix handrails that are broken or loose. Make sure that handrails are as long as the stairways. Check any carpeting to make sure that it is firmly attached to the stairs. Fix any carpet that is loose or worn. Avoid having throw rugs at the top or bottom of the stairs. If you do have throw rugs, attach them to the floor with carpet tape. Make sure that you have a light  switch at the top of the stairs and the bottom of the stairs. If you do not have them, ask someone to add them for you. What else can I do to help prevent falls? Wear shoes that: Do not have high heels. Have rubber bottoms. Are comfortable and fit you well. Are closed at the toe. Do not wear sandals. If you use a stepladder: Make sure that it is fully opened. Do not climb a closed stepladder. Make sure that both sides of the stepladder are locked into place. Ask someone to hold it for you, if possible. Clearly mark and make sure that you can see: Any grab bars or handrails. First and last steps. Where the edge of each step is. Use tools that help you move around (mobility aids) if they are needed. These include: Canes. Walkers. Scooters. Crutches. Turn on the lights when you go into a dark area. Replace any light bulbs as soon as they burn out. Set up your furniture so you have a clear path. Avoid moving your furniture around. If any of your floors are uneven, fix them. If there are any pets around you, be aware of where they are. Review your medicines with your doctor. Some medicines can make you feel dizzy. This can increase your chance of falling. Ask your doctor what other things that you can do to help prevent falls. This information is not intended to replace advice given to you by your health care provider. Make sure you discuss any questions you have with your health care provider. Document Released: 12/09/2008 Document Revised: 07/21/2015 Document Reviewed: 03/19/2014 Elsevier Interactive Patient Education  2017 [employer].

## 2022-04-23 NOTE — Progress Notes (Addendum)
I connected with  Savannah [name] on 04/23/2022 at 3:30  EST by telephone and verified that I am speaking with the correct person using two identifiers.  Location: Patient: Home Provider: [place] Persons participating in the virtual visit: [place]   I discussed the limitations, risks, security and privacy concerns of performing an evaluation and management service by telephone and the availability of in person appointments. The patient expressed understanding and agreed to proceed.  Interactive audio and video telecommunications were attempted between this nurse and patient, however failed, due to patient having technical difficulties OR patient did not have access to video capability.  We continued and completed visit with audio only.  Some vital signs may be absent or patient reported.   Sheral Flow, LPN  Subjective:   Savannah [name] is a 67 y.o. female who presents for Medicare Annual (Subsequent) preventive examination.  Patient Medicare AWV questionnaire was completed by the patient on 04/22/2022; I have confirmed that all information answered by patient is correct and no changes since this date.    Review of Systems:     Cardiac Risk Factors include: advanced age (>25mn, >>55women);diabetes mellitus;dyslipidemia;family history of premature cardiovascular disease;hypertension;obesity (BMI >30kg/m2);sedentary lifestyle     Objective:    Today's Vitals   04/23/22 1528  PainSc: 2    There is no height or weight on file to calculate BMI.     04/23/2022    3:31 PM 04/20/2021   10:33 AM 01/13/2021    6:58 AM 01/10/2021    1:07 PM 03/07/2018    9:20 AM 03/06/2018    8:38 PM 06/21/2017   12:09 PM  Advanced Directives  Does Patient Have a Medical Advance Directive? No No No No No No No  Does patient want to make changes to medical advance directive?     No - Patient declined Yes (ED - Information included in AVS)   Would patient like  information on creating a medical advance directive? No - Patient declined No - Patient declined No - Patient declined  No - Patient declined  No - Patient declined    Current Medications (verified) Outpatient Encounter Medications as of 04/23/2022  Medication Sig   albuterol (PROAIR HFA) 108 (90 Base) MCG/ACT inhaler Inhale 2 puffs into the lungs every 4 (four) hours as needed for wheezing or shortness of breath (cough).   atorvastatin (LIPITOR) 40 MG tablet TAKE 1 TABLET BY MOUTH EVERY DAY   cephALEXin (KEFLEX) 500 MG capsule Take 1 capsule (500 mg total) by mouth 3 (three) times daily.   cholecalciferol (VITAMIN D) 1000 units tablet Take 1,000 Units by mouth daily.   citalopram (CELEXA) 20 MG tablet TAKE 1 TABLET BY MOUTH EVERY DAY IN THE MORNING   collagenase (SANTYL) ointment Apply 1 application topically daily.   dapagliflozin propanediol (FARXIGA) 10 MG TABS tablet Take 1 tablet (10 mg total) by mouth daily before breakfast.   eszopiclone (LUNESTA) 2 MG TABS tablet TAKE 1 TABLET BY MOUTH IMMEDIATELY BEFORE BEDTIME AS NEEDED FOR SLEEP   fluticasone-salmeterol (ADVAIR) 100-50 MCG/ACT AEPB Inhale 1 puff into the lungs 2 (two) times daily. (Patient taking differently: Inhale 1 puff into the lungs daily.)   gabapentin (NEURONTIN) 300 MG capsule TAKE 1 CAPSULE BY MOUTH EVERYDAY AT BEDTIME   hyoscyamine (LEVSIN SL) 0.125 MG SL tablet Take 1-2 tablets by mouth or under tongue every 4 hours as needed.   levothyroxine (SYNTHROID) 125 MCG tablet Take 1 tablet (125 mcg total) by  mouth daily.   losartan (COZAAR) 100 MG tablet TAKE 1 TABLET BY MOUTH EVERY DAY   losartan-hydrochlorothiazide (HYZAAR) 100-12.5 MG tablet Take 1 tablet every day by oral route.   metFORMIN (GLUCOPHAGE-XR) 500 MG 24 hr tablet TAKE 2 TABLETS BY MOUTH TWICE A DAY   metoprolol succinate (TOPROL-XL) 25 MG 24 hr tablet TAKE 1/2 TABLET BY MOUTH EVERY DAY   ondansetron (ZOFRAN-ODT) 4 MG disintegrating tablet Take 1 tablet (4 mg  total) by mouth every 8 (eight) hours as needed for nausea or vomiting.   Oxycodone HCl 10 MG TABS Take 1 tablet (10 mg total) by mouth every 6 (six) hours as needed (breakthrogh pain post-operatively).   RESTASIS 0.05 % ophthalmic emulsion Place 1 drop into both eyes 2 (two) times daily.   SUMAtriptan (IMITREX) 100 MG tablet Take 1 tablet (100 mg total) by mouth every 2 (two) hours as needed for migraine or headache. May repeat in 2 hours if headache persists or recurs.   No facility-administered encounter medications on file as of 04/23/2022.    Allergies (verified) Erythromycin   History: Past Medical History:  Diagnosis Date   Allergic rhinitis    Arthritis    arthritis in back   Asthma    B12 deficiency    Cancer ([place]) 2016   kidney    CKD (chronic kidney disease) 04/20/2020   stage 3 per OV note on 04/20/20 from Dr. 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),  clinically  localized w/ negative margins   Hypercholesterolemia    Hypertension    Hypothyroidism    endocrinologist -  dr ellision   Iron deficiency anemia    Lumbar spondylosis    NAFLD (nonalcoholic fatty liver disease)    dx 2010--- last hepatic panel in epic dated 04-17-2017   Nocturia    OA (osteoarthritis of spine)    Lumbar   OSA on CPAP pulmologist-  dr Halford Chessman   sleep study 09-11-2004  very severe osa , AHI 119/hr,  setting 10   Peripheral neuropathy    bottom of feet   Renal calculus, right    Sleep apnea    uses CPAP   Type 2 diabetes mellitus ([place])    endocrinoloigst-  dr Loanne Drilling,  last A1c 5.9 on 02-20-219 in epic   Vitamin B 12 deficiency    Wears glasses    Past Surgical History:  Procedure Laterality Date   BUNIONECTOMY/  HAMMERTOE CORRECTION'S  09-07-2013   SCG    RIGHT FOOT 2ND, 3RD, 4TH, 5TH DIGITS   CARDIAC CATHETERIZATION  06-01-2009  dr Angelena Form   no evidence CAD,  normal LVSF, elevated blood pressure w/ elevated end-diastolic pressure, ef [id number]   CARDIOVASCULAR STRESS TEST  05-11-2009   dr Angelena Form   abnormal lexiscan nuclear study (no exercise) w/ mild ischemia in the distal anteroseptal wall and apex, this defect may be due to shifting breast attenuation   COLONOSCOPY  last 02/08/2014   COLONOSCOPY WITH SNARE POLYPECTOMY WITH ESOPHAGOGASTRODUODENOSCOPY  02/08/2014   with biopsy Dr. Jerilynn Mages. [facility] Plan   CYSTOSCOPY WITH RETROGRADE PYELOGRAM, URETEROSCOPY AND STENT PLACEMENT Right 06/07/2017   Procedure: CYSTOSCOPY WITH RETROGRADE PYELOGRAM, URETEROSCOPY, LITHOTRIPSY,  AND STENT PLACEMENT 1st STAGE;  Surgeon: Alexis Frock, MD;  [facility];  Service: Urology;  Laterality: Right;   CYSTOSCOPY WITH RETROGRADE PYELOGRAM, URETEROSCOPY AND STENT PLACEMENT Right 06/21/2017   Procedure: CYSTOSCOPY WITH RETROGRADE PYELOGRAM, URETEROSCOPY AND STENT PLACEMENT 2ND STAGE  STENT" EXCHANGE";  Surgeon: Alexis Frock, MD;  Location: [facility];  Service: Urology;  Laterality: Right;   CYSTOSCOPY WITH RETROGRADE PYELOGRAM, URETEROSCOPY AND STENT PLACEMENT Right 01/13/2021   Procedure: FIRST STAGE CYSTOSCOPY WITH RETROGRADE PYELOGRAM, URETEROSCOPY AND STENT PLACEMENT;  Surgeon: Alexis Frock, MD;  Location: [facility];  Service: Urology;  Laterality: Right;  85 MINS   CYSTOSCOPY WITH RETROGRADE PYELOGRAM, URETEROSCOPY AND STENT PLACEMENT Right 01/27/2021   Procedure: SECOND STAGE CYSTOSCOPY WITH RETROGRADE PYELOGRAM, URETEROSCOPY AND STENT EXCHANGE;  Surgeon: Alexis Frock, MD;  Location: [facility];  Service: Urology;  Laterality: Right;  90 MINS   FRACTURE SURGERY Right    Compound fracture foot   FRACTURE SURGERY Right 1963   Pt had surgery to repair broken arm.   HOLMIUM LASER APPLICATION Right [id number]    Procedure: HOLMIUM LASER APPLICATION;  Surgeon: Alexis Frock, MD;  Location: [facility];  Service: Urology;  Laterality: Right;   HOLMIUM LASER APPLICATION Right [id number]   Procedure: HOLMIUM LASER APPLICATION;  Surgeon: Alexis Frock, MD;  Location: [facility];  Service: Urology;  Laterality: Right;   HOLMIUM LASER APPLICATION Right [id number]   Procedure: HOLMIUM LASER APPLICATION;  Surgeon: Alexis Frock, MD;  Location: [facility];  Service: Urology;  Laterality: Right;   HOLMIUM LASER APPLICATION Right [id number]   Procedure: HOLMIUM LASER APPLICATION;  Surgeon: Alexis Frock, MD;  Location: [facility];  Service: Urology;  Laterality: Right;   KNEE ARTHROSCOPY Left 1993   POLYPECTOMY     ROBOTIC ASSITED PARTIAL NEPHRECTOMY Right 05/19/2014   Procedure: ROBOTIC ASSITED PARTIAL NEPHRECTOMY;  Surgeon: Alexis Frock, MD;  Location: [facility];  Service: Urology;  Laterality: Right;   Family History  Problem Relation Age of Onset   Colon cancer Mother 61   Diabetes Mother    Kidney disease Mother    Colon polyps Mother    Heart attack Father    Heart disease Father    Allergies Sister    Asthma Sister    Clotting disorder Grandchild    Heart disease Paternal Grandmother    Esophageal cancer Neg Hx    Rectal cancer Neg Hx    Stomach cancer Neg Hx    Social History   Socioeconomic History   Marital status: Married    Spouse name: Not on file   Number of children: 2   Years of education: Not on file   Highest education level: Not on file  Occupational History   Occupation: Scientist, research (physical sciences): UNEMPLOYED  Tobacco Use   Smoking status: Former    Packs/day: 0.30    Years: 2.00    Total pack years: 0.60    Types: Cigarettes    Quit date: 02/27/1975    Years since quitting: 47.1   Smokeless tobacco: Never  Vaping Use   Vaping Use: Never used  Substance and Sexual Activity   Alcohol use: No   Drug use: No   Sexual  activity: Not on file  Other Topics Concern   Not on file  Social History Narrative   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   Social Determinants of Health   Financial Resource Strain: Low Risk  (04/23/2022)   Overall  Financial Resource Strain (CARDIA)    Difficulty of Paying Living Expenses: Not very hard  Food Insecurity: No Food Insecurity (04/23/2022)   Hunger Vital Sign    Worried About Running Out of Food in the Last Year: Never true    Ran Out of Food in the Last Year: Never true  Transportation Needs: No Transportation Needs (04/23/2022)   PRAPARE - Hydrologist (Medical): No    Lack of Transportation (Non-Medical): No  Physical Activity: Insufficiently Active (04/23/2022)   Exercise Vital Sign    Days of Exercise per Week: 1 day    Minutes of Exercise per Session: 20 min  Stress: No Stress Concern Present (04/23/2022)   [place]    Feeling of Stress : Only a little  Social Connections: Moderately Integrated (04/23/2022)   Social Connection and Isolation Panel [NHANES]    Frequency of Communication with Friends and Family: More than three times a week    Frequency of Social Gatherings with Friends and Family: Three times a week    Attends Religious Services: More than 4 times per year    Active Member of Clubs or Organizations: No    Attends Archivist Meetings: Never    Marital Status: Married    Tobacco Counseling Counseling given: Not Answered   Clinical Intake:  Pre-visit preparation completed: Yes  Pain : 0-10 Pain Score: 2  Pain Type: Chronic pain Pain Location: Back Pain Orientation: Lower     BMI - recorded: 34.06 Nutritional Status: BMI > 30  Obese Nutritional Risks: None Diabetes: No  How often do you need to have someone help you when you read instructions, pamphlets, or other written materials from your doctor or pharmacy?: 1 -  Never What is the last grade level you completed in school?: HSG  Nutrition Risk Assessment:  Has the patient had any N/V/D within the last 2 months?  No  Does the patient have any non-healing wounds?  No  Has the patient had any unintentional weight loss or weight gain?  No   Diabetes:  Is the patient diabetic?  Yes  If diabetic, was a CBG obtained today?  No  Did the patient bring in their glucometer from home?  No  How often do you monitor your CBG's? No.   Financial Strains and Diabetes Management:  Are you having any financial strains with the device, your supplies or your medication? No .  Does the patient want to be seen by Chronic Care Management for management of their diabetes?  No  Would the patient like to be referred to a Nutritionist or for Diabetic Management?  No   Diabetic Exams:  Diabetic Eye Exam: Overdue for diabetic eye exam. Pt has been advised about the importance in completing this exam. Patient advised to call and schedule an eye exam. Diabetic Foot Exam: Completed 04/20/2021   Interpreter Needed?: No  Information entered by :: Lisette Abu, LPN.   Activities of Daily Living    04/23/2022    3:32 PM 04/22/2022    2:25 PM  In your present state of health, do you have any difficulty performing the following activities:  Hearing? 0 0  Vision? 0 0  Difficulty concentrating or making decisions? 0 0  Walking or climbing stairs? 1 1  Dressing or bathing? 0 0  Doing errands, shopping? 0 0  Preparing Food and eating ? N N  Using the Toilet? N N  In the past six months, have you accidently leaked urine? N N  Do you have problems with loss of bowel control? N N  Managing your Medications? N N  Managing your Finances? N N  Housekeeping or managing your Housekeeping? N N    Patient Care Team: Biagio Borg, MD as PCP - General (Internal Medicine) Harriet Masson, DPM as Consulting Physician (Podiatry) Suella Broad, MD (Physical Medicine and  Rehabilitation) Carolan Clines, MD (Inactive) as Attending Physician (Urology) Paula Compton, MD (Obstetrics and Gynecology) Clance, Armando Reichert, MD (Pulmonary Disease) Himmelrich, Bryson Ha, RD (Inactive) as Dietitian Willow Ora as Consulting Physician (Optometry)  Indicate any recent Medical Services you may have received from other than [facility] providers in the past year (date may be approximate).     Assessment:   This is a routine wellness examination for [place].  Hearing/Vision screen Hearing Screening - Comments:: Denies hearing difficulties   Vision Screening - Comments:: Wears rx glasses - up to date with routine eye exams with Maryruth Hancock Hutto, OD.   Dietary issues and exercise activities discussed: Current Exercise Habits: Home exercise routine, Type of exercise: walking, Time (Minutes): 20, Frequency (Times/Week): 1, Weekly Exercise (Minutes/Week): 20, Intensity: Moderate, Exercise limited by: orthopedic condition(s)   Goals Addressed             This Visit's Progress    Client will verbalize knowledge of diabetes self-management as evidenced by Hgb A1C <7 or as defined by provider.            Depression Screen    04/23/2022    3:32 PM 04/19/2022   11:16 AM 10/20/2021   10:12 AM 04/20/2021   11:25 AM 04/20/2021   10:36 AM 04/20/2020   11:30 AM 10/16/2019    2:04 PM  PHQ 2/9 Scores  PHQ - 2 Score 0 0 0 0 0 1 0  PHQ- 9 Score 0 0 0        Fall Risk    04/23/2022    3:32 PM 04/22/2022    2:25 PM 04/19/2022   11:16 AM 04/20/2021   11:24 AM 04/20/2021   10:36 AM  Fall Risk   Falls in the past year? 0 0 0 0 0  Number falls in past yr: 0  0 0 0  Injury with Fall? 0 0 0 0 0  Risk for fall due to : No Fall Risks  No Fall Risks  No Fall Risks  Follow up Falls prevention discussed  Falls evaluation completed  Falls evaluation completed    FALL RISK PREVENTION PERTAINING TO THE HOME:  Any stairs in or around the home? No  If so, are there any without handrails? No   Home free of loose throw rugs in walkways, pet beds, electrical cords, etc? Yes  Adequate lighting in your home to reduce risk of falls? Yes   ASSISTIVE DEVICES UTILIZED TO PREVENT FALLS:  Life alert? No  Use of a cane, walker or w/c? Yes  Grab bars in the bathroom? Yes  Shower chair or bench in shower? Yes  Elevated toilet seat or a handicapped toilet? No   TIMED UP AND GO:  Was the test performed? No . Telephonic Visit  Cognitive Function:        04/23/2022    3:33 PM 04/20/2021   10:37 AM  6CIT Screen  What Year? 0 points 0 points  What month? 0 points 0 points  What time? 0 points 0 points  Count back from 20  0 points 0 points  Months in reverse 0 points 0 points  Repeat phrase 0 points 0 points  Total Score 0 points 0 points    Immunizations Immunization History  Administered Date(s) Administered   Fluad Quad(high Dose 65+) 12/09/2019, 12/31/2020, 01/24/2022   Influenza Inj Mdck Quad Pf 12/20/2016   Influenza Split 12/29/2010, 12/28/2011   Influenza Whole 12/16/2007, 12/06/2008, 12/26/2009   Influenza,inj,Quad PF,6+ Mos 04/15/2015, 01/06/2016, 11/07/2017, 02/04/2019   Influenza-Unspecified 11/26/2013, 12/27/2016   PFIZER(Purple Top)SARS-COV-2 Vaccination 05/22/2019, 06/15/2019, 01/01/2020   Pneumococcal Conjugate-13 10/16/2019   Pneumococcal Polysaccharide-23 05/09/2010, 04/17/2017   Td 11/16/2004   Tdap 04/17/2016   Zoster, Live 04/15/2015    TDAP status: Up to date  Flu Vaccine status: Up to date  Pneumococcal vaccine status: Up to date  Covid-19 vaccine status: Completed vaccines  Qualifies for Shingles Vaccine? Yes   Zostavax completed Yes   Shingrix Completed?: No.    Education has been provided regarding the importance of this vaccine. Patient has been advised to call insurance company to determine out of pocket expense if they have not yet received this vaccine. Advised may also receive vaccine at local pharmacy or Health Dept. Verbalized  acceptance and understanding.  Screening Tests Health Maintenance  Topic Date Due   MAMMOGRAM  10/19/2020   COVID-19 Vaccine (4 - 2023-24 season) 05/05/2022 (Originally 10/27/2021)   OPHTHALMOLOGY EXAM  05/28/2022 (Originally 05/09/2021)   Zoster Vaccines- Shingrix (1 of 2) 07/18/2022 (Originally 08/15/1974)   COLONOSCOPY (Pts 45-41yr Insurance coverage will need to be confirmed)  04/20/2023 (Originally 02/11/2022)   HEMOGLOBIN A1C  10/18/2022   Diabetic kidney evaluation - eGFR measurement  04/20/2023   Diabetic kidney evaluation - Urine ACR  04/20/2023   FOOT EXAM  04/20/2023   Medicare Annual Wellness (AWV)  04/24/2023   Pneumonia Vaccine 68 Years old (3 of 3 - PPSV23 or PCV20) 10/15/2024   DTaP/Tdap/Td (3 - Td or Tdap) 04/17/2026   INFLUENZA VACCINE  Completed   DEXA SCAN  Completed   Hepatitis C Screening  Completed   HPV VACCINES  Aged Out    Health Maintenance  Health Maintenance Due  Topic Date Due   MAMMOGRAM  10/19/2020    Colorectal cancer screening: Type of screening: Colonoscopy. Completed 02/12/2019. Repeat every 3 years (postponed until 04/20/2023 per PCP)  Mammogram status: Completed 10/20/2018. Repeat every year (Per patient normally done at OB/GYN office once a tear)  Bine Density status: Never done  Lung Cancer Screening: (Low Dose CT Chest recommended if Age 68-80years, 30 pack-year currently smoking OR have quit w/in 15years.) does not qualify.   Lung Cancer Screening Referral: no  Additional Screening:  Hepatitis C Screening: does qualify; Completed 04/17/2016  Vision Screening: Recommended annual ophthalmology exams for early detection of glaucoma and other disorders of the eye. Is the patient up to date with their annual eye exam?  No  Who is the provider or what is the name of the office in which the patient attends annual eye exams? Neill Hutto, OD. If pt is not established with a provider, would they like to be referred to a provider to establish  care? No .   Dental Screening: Recommended annual dental exams for proper oral hygiene  Community Resource Referral / Chronic Care Management: CRR required this visit?  No   CCM required this visit?  No      Plan:     I have personally reviewed and noted the following in the patient's chart:   Medical  and social history Use of alcohol, tobacco or illicit drugs  Current medications and supplements including opioid prescriptions. Patient is currently taking opioid prescriptions. Information provided to patient regarding non-opioid alternatives. Patient advised to discuss non-opioid treatment plan with their provider. Functional ability and status Nutritional status Physical activity Advanced directives List of other physicians Hospitalizations, surgeries, and ER visits in previous 12 months Vitals Screenings to include cognitive, depression, and falls Referrals and appointments  In addition, I have reviewed and discussed with patient certain preventive protocols, quality metrics, and best practice recommendations. A written personalized care plan for preventive services as well as general preventive health recommendations were provided to patient.     Sheral Flow, LPN   [id number]   Nurse Notes: Normal cognitive status assessed by direct observation by this Nurse Health Advisor. No abnormalities found.  Patient Medicare AWV questionnaire was completed by the patient on 04/22/2022; I have confirmed that all information answered by patient is correct and no changes since this date.      Medical screening examination/treatment/procedure(s) were performed by non-physician practitioner and as supervising physician I was immediately available for consultation/collaboration.  I agree with above. Lew Dawes, MD

## 2022-04-25 ENCOUNTER — Encounter: Payer: Self-pay | Admitting: Internal Medicine

## 2022-04-25 DIAGNOSIS — H9202 Otalgia, left ear: Secondary | ICD-10-CM | POA: Insufficient documentation

## 2022-04-25 NOTE — Assessment & Plan Note (Signed)
Lab Results  Component Value Date   TSH 3.92 04/19/2022   Stable, pt to continue levothyroxine 125 mcg qd

## 2022-04-25 NOTE — Assessment & Plan Note (Signed)
Lab Results  Component Value Date   [id number] 04/19/2022   Stable, cont oral replacement - b12 1000 mcg qd

## 2022-04-25 NOTE — Assessment & Plan Note (Signed)
Lab Results  Component Value Date   LDLCALC 44 04/19/2022   Stable, pt to continue current statin lipitor 40 mg qd

## 2022-04-25 NOTE — Assessment & Plan Note (Signed)
Lab Results  Component Value Date   HGBA1C 7.7 (H) 04/19/2022   Uncontrolled but improved, pt to continue current medical treatment farxiga 10 qd, and metformin ER 500 mg - 2 bid, declines other change today  Current Outpatient Medications (Endocrine & Metabolic):    dapagliflozin propanediol (FARXIGA) 10 MG TABS tablet, Take 1 tablet (10 mg total) by mouth daily before breakfast.   levothyroxine (SYNTHROID) 125 MCG tablet, Take 1 tablet (125 mcg total) by mouth daily.   metFORMIN (GLUCOPHAGE-XR) 500 MG 24 hr tablet, TAKE 2 TABLETS BY MOUTH TWICE A DAY  Current Outpatient Medications (Cardiovascular):    atorvastatin (LIPITOR) 40 MG tablet, TAKE 1 TABLET BY MOUTH EVERY DAY   losartan (COZAAR) 100 MG tablet, TAKE 1 TABLET BY MOUTH EVERY DAY   losartan-hydrochlorothiazide (HYZAAR) 100-12.5 MG tablet, Take 1 tablet every day by oral route.   metoprolol succinate (TOPROL-XL) 25 MG 24 hr tablet, TAKE 1/2 TABLET BY MOUTH EVERY DAY  Current Outpatient Medications (Respiratory):    albuterol (PROAIR HFA) 108 (90 Base) MCG/ACT inhaler, Inhale 2 puffs into the lungs every 4 (four) hours as needed for wheezing or shortness of breath (cough).   fluticasone-salmeterol (ADVAIR) 100-50 MCG/ACT AEPB, Inhale 1 puff into the lungs 2 (two) times daily. (Patient taking differently: Inhale 1 puff into the lungs daily.)  Current Outpatient Medications (Analgesics):    Oxycodone HCl 10 MG TABS, Take 1 tablet (10 mg total) by mouth every 6 (six) hours as needed (breakthrogh pain post-operatively).   SUMAtriptan (IMITREX) 100 MG tablet, Take 1 tablet (100 mg total) by mouth every 2 (two) hours as needed for migraine or headache. May repeat in 2 hours if headache persists or recurs.   Current Outpatient Medications (Other):    cephALEXin (KEFLEX) 500 MG capsule, Take 1 capsule (500 mg total) by mouth 3 (three) times daily.   cholecalciferol (VITAMIN D) 1000 units tablet, Take 1,000 Units by mouth daily.    citalopram (CELEXA) 20 MG tablet, TAKE 1 TABLET BY MOUTH EVERY DAY IN THE MORNING   collagenase (SANTYL) ointment, Apply 1 application topically daily.   eszopiclone (LUNESTA) 2 MG TABS tablet, TAKE 1 TABLET BY MOUTH IMMEDIATELY BEFORE BEDTIME AS NEEDED FOR SLEEP   gabapentin (NEURONTIN) 300 MG capsule, TAKE 1 CAPSULE BY MOUTH EVERYDAY AT BEDTIME   hyoscyamine (LEVSIN SL) 0.125 MG SL tablet, Take 1-2 tablets by mouth or under tongue every 4 hours as needed.   ondansetron (ZOFRAN-ODT) 4 MG disintegrating tablet, Take 1 tablet (4 mg total) by mouth every 8 (eight) hours as needed for nausea or vomiting.   RESTASIS 0.05 % ophthalmic emulsion, Place 1 drop into both eyes 2 (two) times daily.

## 2022-04-25 NOTE — Assessment & Plan Note (Signed)
Last vitamin D Lab Results  Component Value Date   VD25OH 47.23 04/19/2022   Stable, cont oral replacement

## 2022-04-25 NOTE — Assessment & Plan Note (Signed)
BP Readings from Last 3 Encounters:  04/19/22 132/80  10/20/21 (!) 140/72  04/20/21 120/60   Stable, pt to continue medical treatment hyzaar 100 12.5qd, toprol xl 25 qd

## 2022-04-25 NOTE — Assessment & Plan Note (Signed)
Improving s/p uri - for mucinex bid prn

## 2022-04-30 ENCOUNTER — Other Ambulatory Visit: Payer: Self-pay | Admitting: *Deleted

## 2022-04-30 MED ORDER — METFORMIN HCL ER 500 MG PO TB24: 1000.0000 mg | ORAL_TABLET | Freq: Two times a day (BID) | ORAL | 1 refills | Status: DC

## 2022-05-16 ENCOUNTER — Other Ambulatory Visit: Payer: Self-pay | Admitting: Internal Medicine

## 2022-05-19 ENCOUNTER — Other Ambulatory Visit: Payer: Self-pay | Admitting: Internal Medicine

## 2022-05-19 DIAGNOSIS — N183 Chronic kidney disease, stage 3 unspecified: Secondary | ICD-10-CM

## 2022-05-19 DIAGNOSIS — N1831 Chronic kidney disease, stage 3a: Secondary | ICD-10-CM

## 2022-06-05 ENCOUNTER — Other Ambulatory Visit: Payer: Self-pay | Admitting: Internal Medicine

## 2022-06-05 DIAGNOSIS — N1831 Chronic kidney disease, stage 3a: Secondary | ICD-10-CM

## 2022-06-05 DIAGNOSIS — N183 Chronic kidney disease, stage 3 unspecified: Secondary | ICD-10-CM

## 2022-06-05 DIAGNOSIS — E119 Type 2 diabetes mellitus without complications: Secondary | ICD-10-CM

## 2022-06-13 ENCOUNTER — Other Ambulatory Visit: Payer: Self-pay | Admitting: Internal Medicine

## 2022-06-20 ENCOUNTER — Other Ambulatory Visit: Payer: Self-pay | Admitting: Internal Medicine

## 2022-06-22 ENCOUNTER — Encounter: Payer: Self-pay | Admitting: Family Medicine

## 2022-06-22 ENCOUNTER — Ambulatory Visit (INDEPENDENT_AMBULATORY_CARE_PROVIDER_SITE_OTHER): Payer: BC Managed Care – PPO | Admitting: Family Medicine

## 2022-06-22 VITALS — BP 130/64 | HR 80 | Temp 97.6°F | Ht 68.0 in | Wt 223.0 lb

## 2022-06-22 DIAGNOSIS — H66002 Acute suppurative otitis media without spontaneous rupture of ear drum, left ear: Secondary | ICD-10-CM

## 2022-06-22 MED ORDER — CEFDINIR 300 MG PO CAPS: 300.0000 mg | ORAL_CAPSULE | Freq: Two times a day (BID) | ORAL | 0 refills | Status: DC

## 2022-06-22 MED ORDER — CEFTRIAXONE SODIUM 1 G IJ SOLR
1.0000 g | Freq: Once | INTRAMUSCULAR | Status: AC
  Administered 2022-06-22: 1 g via INTRAMUSCULAR

## 2022-06-22 NOTE — Progress Notes (Signed)
Subjective:  Savannah [name] is a 67 y.o. female who presents for bilateral ear pain, worse on left. She went to a [facility] on 06/12/2022. Completed a course of Augmentin without any improvement yesterday.   Denies fever, chills, dizziness, chest pain, palpitations, shortness of breath, N/V/D.   Does not smoke.    No other aggravating or relieving factors.  No other c/o.  ROS as in subjective.   Objective: Vitals:   06/22/22 1457  BP: 130/64  Pulse: 80  Temp: 97.6 F (36.4 C)  SpO2: 96%    General appearance: Alert, WD/WN, no distress, mildly ill appearing                             Skin: warm, no rash                           Head: no sinus tenderness                            Eyes: conjunctiva normal, corneas clear, PERRLA                            Ears: purulent TMs bilateral, worse on L, external ear canals normal                          Nose: septum midline, no drainage              Mouth/throat: MMM, tongue normal, mild pharyngeal erythema                           Neck: supple, no adenopathy, no thyromegaly, nontender                          Heart: RRR                         Lungs: CTA bilaterally, no wheezes, rales, or rhonchi      Assessment: Acute suppurative otitis media of left ear without spontaneous rupture of tympanic membrane, recurrence not specified - Plan: cefdinir (OMNICEF) 300 MG capsule, cefTRIAXone (ROCEPHIN) injection 1 g   Plan: Completed course of Augmentin prescribed by [facility] for same but treatment not helpful.  Rocephin 1G given IM in office. Cefdinir 300 mg prescribed. Recommend Flonase, Mucinex and hydration.  Nasal saline spray for congestion.  Tylenol prn  Call/return if worsening or in one week for recheck.

## 2022-06-28 ENCOUNTER — Encounter: Payer: Self-pay | Admitting: Family Medicine

## 2022-06-28 ENCOUNTER — Ambulatory Visit (INDEPENDENT_AMBULATORY_CARE_PROVIDER_SITE_OTHER): Payer: BC Managed Care – PPO | Admitting: Family Medicine

## 2022-06-28 VITALS — BP 140/88 | HR 85 | Temp 97.6°F | Ht 68.0 in

## 2022-06-28 DIAGNOSIS — H66002 Acute suppurative otitis media without spontaneous rupture of ear drum, left ear: Secondary | ICD-10-CM

## 2022-06-28 DIAGNOSIS — H9192 Unspecified hearing loss, left ear: Secondary | ICD-10-CM | POA: Diagnosis not present

## 2022-06-28 LAB — LAB REPORT - SCANNED
A1c: 7.7
Albumin, Urine POC: 26.3
EGFR: 52
Microalb Creat Ratio: 23

## 2022-06-28 NOTE — Patient Instructions (Signed)
I have placed an urgent referral to ENT.  If you have not heard from anyone by Monday, let me know

## 2022-06-28 NOTE — Progress Notes (Signed)
Subjective:  Savannah [name] is a 67 y.o. female who presents for a 1 wk f/u on left ear infection.  She was given Rocephin injection at her last visit as well as cefdinir 300 mg prescription.  She has 2 days of antibiotics left.  She has noticed improvement in her right ear but not in the left.  Denies fever, chills, dizziness, headache, ST, N/V/D   No other aggravating or relieving factors.  No other c/o.  ROS as in subjective.   Objective: Vitals:   06/28/22 1458  BP: (!) 140/88  Pulse: 85  Temp: 97.6 F (36.4 C)  SpO2: 97%    General appearance: Alert, WD/WN, no distress                             Skin: warm, no rash                           Head: no sinus tenderness                            Eyes: conjunctiva normal, corneas clear, PERRLA                            Ears: purulent left TM, right TM fairly normal appearing.  external ear canals normal. Decreased hearing on left.                           Nose: septum midline, no drainage             Mouth/throat: MMM                           Neck: supple, no adenopathy, nontender                                                Assessment: Acute suppurative otitis media of left ear without spontaneous rupture of tympanic membrane, recurrence not specified - Plan: Ambulatory referral to ENT  Hearing decreased, left - Plan: Ambulatory referral to ENT   Plan: Unfortunately, her left ear has not progressed as well as we hoped.  Prior to coming in last week she had completed a course [facility].  She did not notice any improvement symptoms. Last week she was given Rocephin 1 g IM in the office.  Cefdinir prescribed. She has been using Flonase, Mucinex and treating her symptoms. I have placed a referral to ENT for further evaluation of her left ear.

## 2022-07-09 ENCOUNTER — Encounter: Payer: Self-pay | Admitting: Gastroenterology

## 2022-07-26 ENCOUNTER — Ambulatory Visit (AMBULATORY_SURGERY_CENTER): Payer: Medicare Other

## 2022-07-26 ENCOUNTER — Telehealth: Payer: Self-pay

## 2022-07-26 VITALS — Ht 68.0 in | Wt 215.0 lb

## 2022-07-26 DIAGNOSIS — Z86010 Personal history of colonic polyps: Secondary | ICD-10-CM

## 2022-07-26 MED ORDER — PEG 3350-KCL-NA BICARB-NACL 420 G PO SOLR
4000.0000 mL | Freq: Once | ORAL | 0 refills | Status: AC
Start: 2022-07-26 — End: 2022-07-26

## 2022-07-26 NOTE — Telephone Encounter (Signed)
Called back and patient answered.  Pre visit complete.

## 2022-07-26 NOTE — Progress Notes (Signed)

## 2022-07-26 NOTE — Telephone Encounter (Signed)
Called patient and left message that she had a pre visit appointment with the nurse and that I would call back in 5 minutes to see if we could do her PV.

## 2022-08-01 ENCOUNTER — Other Ambulatory Visit: Payer: Self-pay | Admitting: Internal Medicine

## 2022-08-06 LAB — HM DIABETES EYE EXAM

## 2022-08-20 DIAGNOSIS — H6502 Acute serous otitis media, left ear: Secondary | ICD-10-CM | POA: Insufficient documentation

## 2022-08-20 DIAGNOSIS — H906 Mixed conductive and sensorineural hearing loss, bilateral: Secondary | ICD-10-CM | POA: Insufficient documentation

## 2022-08-23 ENCOUNTER — Ambulatory Visit (AMBULATORY_SURGERY_CENTER): Payer: BC Managed Care – PPO | Admitting: Gastroenterology

## 2022-08-23 ENCOUNTER — Encounter: Payer: Self-pay | Admitting: Gastroenterology

## 2022-08-23 VITALS — BP 139/70 | HR 69 | Temp 98.2°F | Resp 14 | Ht 68.0 in | Wt 215.0 lb

## 2022-08-23 DIAGNOSIS — K635 Polyp of colon: Secondary | ICD-10-CM | POA: Diagnosis not present

## 2022-08-23 DIAGNOSIS — Z800 Family history of malignant neoplasm of digestive organs: Secondary | ICD-10-CM

## 2022-08-23 DIAGNOSIS — Z09 Encounter for follow-up examination after completed treatment for conditions other than malignant neoplasm: Secondary | ICD-10-CM | POA: Diagnosis present

## 2022-08-23 DIAGNOSIS — Z86010 Personal history of colonic polyps: Secondary | ICD-10-CM | POA: Diagnosis not present

## 2022-08-23 DIAGNOSIS — D123 Benign neoplasm of transverse colon: Secondary | ICD-10-CM | POA: Diagnosis not present

## 2022-08-23 DIAGNOSIS — D125 Benign neoplasm of sigmoid colon: Secondary | ICD-10-CM

## 2022-08-23 MED ORDER — SODIUM CHLORIDE 0.9 % IV SOLN
500.0000 mL | Freq: Once | INTRAVENOUS | Status: DC
Start: 2022-08-23 — End: 2022-08-23

## 2022-08-23 NOTE — Progress Notes (Signed)
Report to PACU, RN, vss, BBS= Clear.  

## 2022-08-23 NOTE — Progress Notes (Signed)
Called to room to assist during endoscopic procedure.  Patient ID and intended procedure confirmed with present staff. Received instructions for my participation in the procedure from the performing physician.  

## 2022-08-23 NOTE — Progress Notes (Signed)
Pt's states no medical or surgical changes since previsit or office visit. 

## 2022-08-23 NOTE — Op Note (Addendum)
[facility] Patient Name: Savannah [name] Procedure Date: 08/23/2022 8:57 AM MRN: [id number] Endoscopist: Meryl Dare , MD, [phone] Age: 67 Referring MD:  Date of Birth: 16-Oct-1955 Gender: Female Account #: [id number] Procedure:                Colonoscopy Indications:              Surveillance: Personal history of multiple                            adenomatous polyps on last colonoscopy > 3 years                            ago, Family history of colon cancer, 1st degree                            relative Medicines:                Monitored Anesthesia Care Procedure:                Pre-Anesthesia Assessment:                           - Prior to the procedure, a History and Physical                            was performed, and patient medications and                            allergies were reviewed. The patient's tolerance of                            previous anesthesia was also reviewed. The risks                            and benefits of the procedure and the sedation                            options and risks were discussed with the patient.                            All questions were answered, and informed consent                            was obtained. Prior Anticoagulants: The patient has                            taken no anticoagulant or antiplatelet agents. ASA                            Grade Assessment: III - A patient with severe                            systemic disease. After reviewing the risks and  benefits, the patient was deemed in satisfactory                            condition to undergo the procedure.                           After obtaining informed consent, the colonoscope                            was passed under direct vision. Throughout the                            procedure, the patient's blood pressure, pulse, and                            oxygen saturations were monitored continuously. The                             Olympus CF-HQ190L ([id number]) Colonoscope was                            introduced through the anus and advanced to the the                            cecum, identified by appendiceal orifice and                            ileocecal valve. The ileocecal valve, appendiceal                            orifice, and rectum were photographed. The quality                            of the bowel preparation was adequate after                            extensive lavage, suction. The colonoscopy was                            performed without difficulty. The patient tolerated                            the procedure well. Scope In: 9:03:01 AM Scope Out: 9:29:42 AM Scope Withdrawal Time: 0 hours 19 minutes 13 seconds  Total Procedure Duration: 0 hours 26 minutes 41 seconds  Findings:                 The perianal and digital rectal examinations were                            normal.                           Five sessile polyps were found in the sigmoid colon                            (  1) and transverse colon (4). The polyps were 6 to                            7 mm in size. These polyps were removed with a cold                            snare. Resection and retrieval were complete.                           Internal hemorrhoids were found during                            retroflexion. The hemorrhoids were small and Grade                            I (internal hemorrhoids that do not prolapse).                           The exam was otherwise without abnormality on                            direct and retroflexion views. Complications:            No immediate complications. Estimated blood loss:                            None. Estimated Blood Loss:     Estimated blood loss: none. Impression:               - Five 6 to 7 mm polyps in the sigmoid colon and in                            the transverse colon, removed with a cold snare.                            Resected  and retrieved.                           - Internal hemorrhoids.                           - The examination was otherwise normal on direct                            and retroflexion views. Recommendation:           - Repeat colonoscopy, likely 3 years, after studies                            are complete for surveillance based on pathology                            results with a more extensive bowel prep.                           -  Patient has a contact number available for                            emergencies. The signs and symptoms of potential                            delayed complications were discussed with the                            patient. Return to normal activities tomorrow.                            Written discharge instructions were provided to the                            patient.                           - Resume previous diet.                           - Continue present medications.                           - Await pathology results. Meryl Dare, MD 08/23/2022 9:33:21 AM This report has been signed electronically.

## 2022-08-23 NOTE — Patient Instructions (Signed)

## 2022-08-23 NOTE — Progress Notes (Signed)
History & Physical  Primary Care Physician:  Corwin Levins, MD Primary Gastroenterologist: Claudette Head, MD  Impression / Plan:  Personal history of multiple adenomatous colon polyps, family history of colon cancer in a first-degree relative for colonoscopy.  CHIEF COMPLAINT:  [facility], Personal history of colon polyps   HPI: Savannah [name] is a 67 y.o. female with a personal history of multiple adenomatous colon polyps, family history of colon cancer in a first-degree relative for colonoscopy.   Past Medical History:  Diagnosis Date   Allergic rhinitis    Arthritis    arthritis in back   Asthma    B12 deficiency    Cancer (HCC) 2016   kidney    CKD (chronic kidney disease) 04/20/2020   stage 3 per OV note on 04/20/20 from Dr. 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),  clinically  localized w/ negative margins   Hypercholesterolemia    Hypertension    Hypothyroidism    endocrinologist -  dr ellision   Iron deficiency anemia    Lumbar spondylosis    NAFLD (nonalcoholic fatty liver disease)    dx 2010--- last hepatic panel in epic dated 04-17-2017   Nocturia    OA (osteoarthritis of spine)    Lumbar   OSA on CPAP pulmologist-  dr Craige Cotta   sleep study 09-11-2004  very severe osa , AHI 119/hr,  setting 10   Peripheral neuropathy    bottom of feet   Renal calculus, right    Sleep apnea    uses CPAP   Type 2 diabetes mellitus (HCC)    endocrinoloigst-  dr Everardo All,  last A1c 5.9 on 02-20-219 in epic   Vitamin B 12 deficiency    Wears glasses     Past Surgical History:  Procedure Laterality Date   BUNIONECTOMY/  HAMMERTOE CORRECTION'S  09-07-2013   SCG   RIGHT FOOT 2ND, 3RD, 4TH, 5TH DIGITS   CARDIAC CATHETERIZATION  06-01-2009  dr  Clifton James   no evidence CAD,  normal LVSF, elevated blood pressure w/ elevated end-diastolic pressure, ef 60-65%   CARDIOVASCULAR STRESS TEST  05-11-2009   dr Clifton James   abnormal lexiscan nuclear study (no exercise) w/ mild ischemia in the distal anteroseptal wall and apex, this defect may be due to shifting breast attenuation   COLONOSCOPY  last 02/08/2014   COLONOSCOPY WITH SNARE POLYPECTOMY WITH ESOPHAGOGASTRODUODENOSCOPY  02/08/2014   with biopsy Dr. Judie Petit. Russella Dar   CYSTOSCOPY WITH RETROGRADE PYELOGRAM, URETEROSCOPY AND STENT PLACEMENT Right 06/07/2017   Procedure: CYSTOSCOPY WITH RETROGRADE PYELOGRAM, URETEROSCOPY, LITHOTRIPSY,  AND STENT PLACEMENT 1st STAGE;  Surgeon: Sebastian Ache, MD;  Location: [facility];  Service: Urology;  Laterality: Right;   CYSTOSCOPY WITH RETROGRADE PYELOGRAM, URETEROSCOPY AND STENT PLACEMENT Right 06/21/2017   Procedure: CYSTOSCOPY WITH RETROGRADE PYELOGRAM, URETEROSCOPY AND STENT PLACEMENT 2ND STAGE  STENT" EXCHANGE";  Surgeon: Sebastian Ache, MD;  Location: [facility];  Service: Urology;  Laterality: Right;   CYSTOSCOPY WITH RETROGRADE PYELOGRAM, URETEROSCOPY AND STENT PLACEMENT Right 01/13/2021   Procedure: FIRST STAGE CYSTOSCOPY WITH RETROGRADE PYELOGRAM, URETEROSCOPY AND STENT PLACEMENT;  Surgeon: Sebastian Ache, MD;  Location: [facility];  Service: Urology;  Laterality: Right;  90 MINS   CYSTOSCOPY WITH RETROGRADE PYELOGRAM, URETEROSCOPY AND STENT PLACEMENT Right 01/27/2021   Procedure: SECOND STAGE CYSTOSCOPY WITH RETROGRADE PYELOGRAM, URETEROSCOPY AND STENT EXCHANGE;  Surgeon: Sebastian Ache, MD;  Location: [facility];  Service: Urology;  Laterality: Right;  90 MINS   FRACTURE SURGERY Right    Compound fracture foot   FRACTURE SURGERY Right 1963   Pt had surgery to repair broken arm.   HOLMIUM LASER APPLICATION Right 06/07/2017   Procedure: HOLMIUM LASER APPLICATION;  Surgeon: Sebastian Ache, MD;  Location:  [facility];  Service: Urology;  Laterality: Right;   HOLMIUM LASER APPLICATION Right 06/21/2017   Procedure: HOLMIUM LASER APPLICATION;  Surgeon: Sebastian Ache, MD;  Location: [facility];  Service: Urology;  Laterality: Right;   HOLMIUM LASER APPLICATION Right 01/13/2021   Procedure: HOLMIUM LASER APPLICATION;  Surgeon: Sebastian Ache, MD;  Location: [facility];  Service: Urology;  Laterality: Right;   HOLMIUM LASER APPLICATION Right 01/27/2021   Procedure: HOLMIUM LASER APPLICATION;  Surgeon: Sebastian Ache, MD;  Location: [facility];  Service: Urology;  Laterality: Right;   KNEE ARTHROSCOPY Left 1993   POLYPECTOMY     ROBOTIC ASSITED PARTIAL NEPHRECTOMY Right 05/19/2014   Procedure: ROBOTIC ASSITED PARTIAL NEPHRECTOMY;  Surgeon: Sebastian Ache, MD;  Location: [facility];  Service: Urology;  Laterality: Right;    Prior to Admission medications   Medication Sig Start Date End Date Taking? Authorizing Provider  atorvastatin (LIPITOR) 40 MG tablet TAKE 1 TABLET BY MOUTH EVERY DAY 07/06/22  Yes Corwin Levins, MD  cholecalciferol (VITAMIN D) 1000 units tablet Take 1,000 Units by mouth daily.   Yes [provider]  citalopram (CELEXA) 20 MG tablet TAKE 1 TABLET BY MOUTH EVERY DAY IN THE MORNING 07/06/22  Yes Corwin Levins, MD  dapagliflozin propanediol (FARXIGA) 10 MG TABS tablet Take 1 tablet (10 mg total) by mouth daily before breakfast. 10/25/21  Yes Corwin Levins, MD  eszopiclone (LUNESTA) 2 MG TABS tablet TAKE 1 TABLET BY MOUTH IMMEDIATELY BEFORE BEDTIME AS NEEDED FOR SLEEP 05/16/22  Yes Corwin Levins, MD  gabapentin (NEURONTIN) 300 MG capsule TAKE 1 CAPSULE BY MOUTH EVERYDAY AT BEDTIME 08/01/22  Yes Corwin Levins, MD  levothyroxine (SYNTHROID) 125 MCG tablet Take 1 tablet (125 mcg total) by mouth daily. 10/25/21  Yes Corwin Levins, MD  losartan (COZAAR) 100 MG tablet TAKE 1 TABLET BY MOUTH EVERY DAY 06/05/22  Yes Corwin Levins, MD  metFORMIN  (GLUCOPHAGE-XR) 500 MG 24 hr tablet Take 2 tablets (1,000 mg total) by mouth 2 (two) times daily. 04/30/22  Yes Corwin Levins, MD  metoprolol succinate (TOPROL-XL) 25 MG 24 hr tablet TAKE 1/2 TABLET BY MOUTH DAILY 06/05/22  Yes Corwin Levins, MD  RESTASIS 0.05 % ophthalmic emulsion Place 1 drop into both eyes 2 (two) times daily. 06/14/20  Yes [provider]  albuterol (PROAIR HFA) 108 (90 Base) MCG/ACT inhaler Inhale 2 puffs into the lungs every 4 (four) hours as needed for wheezing or shortness of breath (cough). 08/04/20   Corwin Levins, MD  collagenase (SANTYL) ointment Apply 1 application topically daily.    [provider]  fluticasone-salmeterol (ADVAIR) 100-50 MCG/ACT AEPB Inhale 1 puff into the lungs 2 (two) times daily. Patient taking differently: Inhale 1 puff into the lungs daily. 08/04/20   Corwin Levins, MD  ondansetron (ZOFRAN-ODT) 4 MG disintegrating tablet Take 1 tablet (4 mg total) by mouth every 8 (eight) hours  as needed for nausea or vomiting. 08/04/20   Corwin Levins, MD  Oxycodone HCl 10 MG TABS Take 1 tablet (10 mg total) by mouth every 6 (six) hours as needed (breakthrogh pain post-operatively). 01/27/21   Loletta Parish., MD  OZEMPIC, 0.25 OR 0.5 MG/DOSE, 2 MG/3ML SOPN Inject into the skin. 06/27/22   [provider]  SUMAtriptan (IMITREX) 100 MG tablet Take 1 tablet (100 mg total) by mouth every 2 (two) hours as needed for migraine or headache. May repeat in 2 hours if headache persists or recurs. 05/21/21   Corwin Levins, MD    Current Outpatient Medications  Medication Sig Dispense Refill   atorvastatin (LIPITOR) 40 MG tablet TAKE 1 TABLET BY MOUTH EVERY DAY 90 tablet 1   cholecalciferol (VITAMIN D) 1000 units tablet Take 1,000 Units by mouth daily.     citalopram (CELEXA) 20 MG tablet TAKE 1 TABLET BY MOUTH EVERY DAY IN THE MORNING 90 tablet 2   dapagliflozin propanediol (FARXIGA) 10 MG TABS tablet Take 1 tablet (10 mg total) by mouth daily before  breakfast. 90 tablet 3   eszopiclone (LUNESTA) 2 MG TABS tablet TAKE 1 TABLET BY MOUTH IMMEDIATELY BEFORE BEDTIME AS NEEDED FOR SLEEP 90 tablet 1   gabapentin (NEURONTIN) 300 MG capsule TAKE 1 CAPSULE BY MOUTH EVERYDAY AT BEDTIME 90 capsule 1   levothyroxine (SYNTHROID) 125 MCG tablet Take 1 tablet (125 mcg total) by mouth daily. 90 tablet 3   losartan (COZAAR) 100 MG tablet TAKE 1 TABLET BY MOUTH EVERY DAY 90 tablet 3   metFORMIN (GLUCOPHAGE-XR) 500 MG 24 hr tablet Take 2 tablets (1,000 mg total) by mouth 2 (two) times daily. 360 tablet 1   metoprolol succinate (TOPROL-XL) 25 MG 24 hr tablet TAKE 1/2 TABLET BY MOUTH DAILY 45 tablet 1   RESTASIS 0.05 % ophthalmic emulsion Place 1 drop into both eyes 2 (two) times daily.     albuterol (PROAIR HFA) 108 (90 Base) MCG/ACT inhaler Inhale 2 puffs into the lungs every 4 (four) hours as needed for wheezing or shortness of breath (cough). 3 each 3   collagenase (SANTYL) ointment Apply 1 application topically daily.     fluticasone-salmeterol (ADVAIR) 100-50 MCG/ACT AEPB Inhale 1 puff into the lungs 2 (two) times daily. (Patient taking differently: Inhale 1 puff into the lungs daily.) 3 each 3   ondansetron (ZOFRAN-ODT) 4 MG disintegrating tablet Take 1 tablet (4 mg total) by mouth every 8 (eight) hours as needed for nausea or vomiting. 20 tablet 0   Oxycodone HCl 10 MG TABS Take 1 tablet (10 mg total) by mouth every 6 (six) hours as needed (breakthrogh pain post-operatively). 10 tablet 0   OZEMPIC, 0.25 OR 0.5 MG/DOSE, 2 MG/3ML SOPN Inject into the skin.     SUMAtriptan (IMITREX) 100 MG tablet Take 1 tablet (100 mg total) by mouth every 2 (two) hours as needed for migraine or headache. May repeat in 2 hours if headache persists or recurs. 10 tablet 5   Current Facility-Administered Medications  Medication Dose Route Frequency Provider Last Rate Last Admin   0.9 %  sodium chloride infusion  500 mL Intravenous Once Meryl Dare, MD        Allergies as  of 08/23/2022 - Review Complete 08/23/2022  Allergen Reaction Noted   Erythromycin Nausea And Vomiting 04/23/2008    Family History  Problem Relation Age of Onset   Colon cancer Mother 34   Diabetes Mother    Kidney disease  Mother    Colon polyps Mother    Heart attack Father    Heart disease Father    Allergies Sister    Asthma Sister    Clotting disorder Grandchild    Heart disease Paternal Grandmother    Esophageal cancer Neg Hx    Rectal cancer Neg Hx    Stomach cancer Neg Hx     Social History   Socioeconomic History   Marital status: Married    Spouse name: Not on file   Number of children: 2   Years of education: Not on file   Highest education level: Not on file  Occupational History   Occupation: Dentist: UNEMPLOYED  Tobacco Use   Smoking status: Former    Packs/day: 0.30    Years: 2.00    Additional pack years: 0.00    Total pack years: 0.60    Types: Cigarettes    Quit date: 02/27/1975    Years since quitting: 47.5   Smokeless tobacco: Never  Vaping Use   Vaping Use: Never used  Substance and Sexual Activity   Alcohol use: No   Drug use: No   Sexual activity: Not on file  Other Topics Concern   Not on file  Social History Narrative   Pt has completed training as a Energy manager   Social Determinants of Health   Financial Resource Strain: Low Risk  (04/23/2022)   Overall Financial Resource Strain (CARDIA)    Difficulty of Paying Living Expenses: Not very hard  Food Insecurity: No Food Insecurity (04/23/2022)   Hunger Vital Sign    Worried About Running Out of Food in the Last Year: Never true    Ran Out of Food in the Last Year: Never true  Transportation Needs: No Transportation Needs (04/23/2022)   PRAPARE - Administrator, Civil Service (Medical): No    Lack of Transportation (Non-Medical): No  Physical Activity: Insufficiently Active (04/23/2022)   Exercise Vital Sign    Days of Exercise per Week: 1 day    Minutes  of Exercise per Session: 20 min  Stress: No Stress Concern Present (04/23/2022)   [employer] of Occupational Health - Occupational Stress Questionnaire    Feeling of Stress : Only a little  Social Connections: Moderately Integrated (04/23/2022)   Social Connection and Isolation Panel [NHANES]    Frequency of Communication with Friends and Family: More than three times a week    Frequency of Social Gatherings with Friends and Family: Three times a week    Attends Religious Services: More than 4 times per year    Active Member of Clubs or Organizations: No    Attends Banker Meetings: Never    Marital Status: Married  Catering manager Violence: Not At Risk (04/23/2022)   Humiliation, Afraid, Rape, and Kick questionnaire    Fear of Current or Ex-Partner: No    Emotionally Abused: No    Physically Abused: No    Sexually Abused: No    Review of Systems:  All systems reviewed were negative except where noted in HPI.   Physical Exam:  General:  Alert, well-developed, in NAD Head:  Normocephalic and atraumatic. Eyes:  Sclera clear, no icterus.   Conjunctiva pink. Ears:  Normal auditory acuity. Mouth:  No deformity or lesions.  Neck:  Supple; no masses. Lungs:  Clear throughout to auscultation.   No wheezes, crackles, or rhonchi.  Heart:  Regular rate and rhythm; no murmurs. Abdomen:  Soft,  nondistended, nontender. No masses, hepatomegaly. No palpable masses.  Normal bowel sounds.    Rectal:  Deferred   Msk:  Symmetrical without gross deformities. Extremities:  Without edema. Neurologic:  Alert and  oriented x 4; grossly normal neurologically. Skin:  Intact without significant lesions or rashes. Psych:  Alert and cooperative. Normal mood and affect.   Venita Lick. Russella Dar  08/23/2022, 8:57 AM See Loretha Stapler, [facility], to contact our on call provider

## 2022-08-24 ENCOUNTER — Telehealth: Payer: Self-pay | Admitting: *Deleted

## 2022-08-24 NOTE — Telephone Encounter (Signed)
  Follow up Call-     08/23/2022    8:16 AM  Call back number  Post procedure Call Back phone  # [phone]  Permission to leave phone message Yes     Patient questions:  Do you have a fever, pain , or abdominal swelling? No. Pain Score  0 *  Have you tolerated food without any problems? Yes.    Have you been able to return to your normal activities? Yes.    Do you have any questions about your discharge instructions: Diet   No. Medications  No. Follow up visit  No.  Do you have questions or concerns about your Care? No.  Actions: * If pain score is 4 or above: No action needed, pain <4.

## 2022-09-11 ENCOUNTER — Encounter: Payer: Self-pay | Admitting: Gastroenterology

## 2022-09-26 ENCOUNTER — Encounter (INDEPENDENT_AMBULATORY_CARE_PROVIDER_SITE_OTHER): Payer: Self-pay

## 2022-10-01 DIAGNOSIS — H65492 Other chronic nonsuppurative otitis media, left ear: Secondary | ICD-10-CM | POA: Insufficient documentation

## 2022-10-13 ENCOUNTER — Other Ambulatory Visit: Payer: Self-pay | Admitting: Internal Medicine

## 2022-10-15 ENCOUNTER — Other Ambulatory Visit: Payer: Self-pay

## 2022-10-18 ENCOUNTER — Other Ambulatory Visit: Payer: Self-pay | Admitting: Internal Medicine

## 2022-10-18 ENCOUNTER — Encounter: Payer: Self-pay | Admitting: Internal Medicine

## 2022-10-18 ENCOUNTER — Other Ambulatory Visit: Payer: Self-pay

## 2022-10-18 ENCOUNTER — Ambulatory Visit (INDEPENDENT_AMBULATORY_CARE_PROVIDER_SITE_OTHER): Payer: BC Managed Care – PPO | Admitting: Internal Medicine

## 2022-10-18 VITALS — BP 130/78 | HR 88 | Temp 98.8°F | Ht 68.0 in | Wt 206.0 lb

## 2022-10-18 DIAGNOSIS — R300 Dysuria: Secondary | ICD-10-CM

## 2022-10-18 DIAGNOSIS — Z7984 Long term (current) use of oral hypoglycemic drugs: Secondary | ICD-10-CM

## 2022-10-18 DIAGNOSIS — E559 Vitamin D deficiency, unspecified: Secondary | ICD-10-CM

## 2022-10-18 DIAGNOSIS — F5101 Primary insomnia: Secondary | ICD-10-CM

## 2022-10-18 DIAGNOSIS — N1831 Chronic kidney disease, stage 3a: Secondary | ICD-10-CM

## 2022-10-18 DIAGNOSIS — E1165 Type 2 diabetes mellitus with hyperglycemia: Secondary | ICD-10-CM

## 2022-10-18 DIAGNOSIS — E7800 Pure hypercholesterolemia, unspecified: Secondary | ICD-10-CM

## 2022-10-18 DIAGNOSIS — I10 Essential (primary) hypertension: Secondary | ICD-10-CM | POA: Diagnosis not present

## 2022-10-18 DIAGNOSIS — E538 Deficiency of other specified B group vitamins: Secondary | ICD-10-CM

## 2022-10-18 DIAGNOSIS — E039 Hypothyroidism, unspecified: Secondary | ICD-10-CM

## 2022-10-18 LAB — CBC WITH DIFFERENTIAL/PLATELET
Basophils Absolute: 0.1 10*3/uL (ref 0.0–0.1)
Basophils Relative: 0.8 % (ref 0.0–3.0)
Eosinophils Absolute: 0.4 10*3/uL (ref 0.0–0.7)
Eosinophils Relative: 4.4 % (ref 0.0–5.0)
HCT: 42.9 % (ref 36.0–46.0)
Hemoglobin: 14.2 g/dL (ref 12.0–15.0)
Lymphocytes Relative: 28.2 % (ref 12.0–46.0)
Lymphs Abs: 2.3 10*3/uL (ref 0.7–4.0)
MCHC: 33.1 g/dL (ref 30.0–36.0)
MCV: 88.8 fl (ref 78.0–100.0)
Monocytes Absolute: 0.5 10*3/uL (ref 0.1–1.0)
Monocytes Relative: 5.4 % (ref 3.0–12.0)
Neutro Abs: 5.1 10*3/uL (ref 1.4–7.7)
Neutrophils Relative %: 61.2 % (ref 43.0–77.0)
Platelets: 213 10*3/uL (ref 150.0–400.0)
RBC: 4.83 Mil/uL (ref 3.87–5.11)
RDW: 13.4 % (ref 11.5–15.5)
WBC: 8.3 10*3/uL (ref 4.0–10.5)

## 2022-10-18 LAB — LIPID PANEL
Cholesterol: 112 mg/dL (ref 0–200)
HDL: 44.9 mg/dL (ref >39.00)
LDL Cholesterol: 43 mg/dL (ref 0–99)
NonHDL: 67.51
Total CHOL/HDL Ratio: 3
Triglycerides: 124 mg/dL (ref 0.0–149.0)
VLDL: 24.8 mg/dL (ref 0.0–40.0)

## 2022-10-18 LAB — URINALYSIS, ROUTINE W REFLEX MICROSCOPIC
Bilirubin Urine: NEGATIVE
Hgb urine dipstick: NEGATIVE
Ketones, ur: NEGATIVE
Leukocytes,Ua: NEGATIVE
Nitrite: NEGATIVE
Specific Gravity, Urine: 1.025 (ref 1.000–1.030)
Total Protein, Urine: NEGATIVE
Urine Glucose: >=1000 — AB
Urobilinogen, UA: 0.2 (ref 0.0–1.0)
pH: 6 (ref 5.0–8.0)

## 2022-10-18 LAB — BASIC METABOLIC PANEL
BUN: 15 mg/dL (ref 6–23)
CO2: 28 meq/L (ref 19–32)
Calcium: 9.6 mg/dL (ref 8.4–10.5)
Chloride: 104 meq/L (ref 96–112)
Creatinine, Ser: 1.01 mg/dL (ref 0.40–1.20)
GFR: 57.34 mL/min — ABNORMAL LOW (ref >60.00)
Glucose, Bld: 111 mg/dL — ABNORMAL HIGH (ref 70–99)
Potassium: 4.6 meq/L (ref 3.5–5.1)
Sodium: 140 meq/L (ref 135–145)

## 2022-10-18 LAB — HEPATIC FUNCTION PANEL
ALT: 20 U/L (ref 0–35)
AST: 20 U/L (ref 0–37)
Albumin: 4.4 g/dL (ref 3.5–5.2)
Alkaline Phosphatase: 68 U/L (ref 39–117)
Bilirubin, Direct: 0.4 mg/dL — ABNORMAL HIGH (ref 0.0–0.3)
Total Bilirubin: 2.3 mg/dL — ABNORMAL HIGH (ref 0.2–1.2)
Total Protein: 7.1 g/dL (ref 6.0–8.3)

## 2022-10-18 LAB — HEMOGLOBIN A1C: Hgb A1c MFr Bld: 5.4 % (ref 4.6–6.5)

## 2022-10-18 MED ORDER — BELSOMRA 15 MG PO TABS: 15.0000 mg | ORAL_TABLET | Freq: Every evening | ORAL | 5 refills | Status: DC | PRN

## 2022-10-18 NOTE — Patient Instructions (Signed)
Please take all new medication as prescribed - the belsomra 15 mg at bedtime  Please continue all other medications as before, and refills have been done if requested.  Please have the pharmacy call with any other refills you may need.  Please continue your efforts at being more active, low cholesterol diet, and weight control.  Please keep your appointments with your specialists as you may have planned  Please go to the LAB at the blood drawing area for the tests to be done  You will be contacted by phone if any changes need to be made immediately.  Otherwise, you will receive a letter about your results with an explanation, but please check with MyChart first.

## 2022-10-18 NOTE — Progress Notes (Signed)
Patient ID: Savannah [name], female   DOB: [birth year], 67 y.o.   MRN: [id number]        Chief Complaint: follow up dysuria, low thyroid, insomnia, dm, low b12, htn, hld, ckd3a, low vit d       HPI:  Savannah [name] is a 67 y.o. female here with c/o 3 days onset mild intermittent dysuria but Denies urinary symptoms such as frequency, urgency, flank pain, hematuria or n/v, fever, chills.  Pt denies chest pain, increased sob or doe, wheezing, orthopnea, PND, increased LE swelling, palpitations, dizziness or syncope.   Pt denies polydipsia, polyuria, or new focal neuro s/s.  Also started ozempic per renal, lower wt down to 206 today. Denies hyper or hypo thyroid symptoms such as voice, skin or hair change.  Also worsening difficulty with sleeping, lunesta no longer working well.    Wt Readings from Last 3 Encounters:  10/18/22 206 lb (93.4 kg)  08/23/22 215 lb (97.5 kg)  07/26/22 215 lb (97.5 kg)   BP Readings from Last 3 Encounters:  10/18/22 130/78  08/23/22 139/70  06/28/22 (!) 140/88         Past Medical History:  Diagnosis Date   Allergic rhinitis    Arthritis    arthritis in back   Asthma    B12 deficiency    Cancer (HCC) 2016   kidney    CKD (chronic kidney disease) 04/20/2020   stage 3 per OV note on 04/20/20 from Dr. 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),  clinically  localized w/ negative margins   Hypercholesterolemia    Hypertension    Hypothyroidism    endocrinologist -  dr ellision   Iron deficiency anemia    Lumbar spondylosis    NAFLD (nonalcoholic fatty liver disease)    dx 2010--- last hepatic panel in epic dated 04-17-2017   Nocturia    OA (osteoarthritis of spine)    Lumbar   OSA on CPAP pulmologist-  dr Craige Cotta   sleep  study 09-11-2004  very severe osa , AHI 119/hr,  setting 10   Peripheral neuropathy    bottom of feet   Renal calculus, right    Sleep apnea    uses CPAP   Type 2 diabetes mellitus (HCC)    endocrinoloigst-  dr Everardo All,  last A1c 5.9 on 02-20-219 in epic   Vitamin B 12 deficiency    Wears glasses    Past Surgical History:  Procedure Laterality Date   BUNIONECTOMY/  HAMMERTOE CORRECTION'S  09-07-2013   SCG   RIGHT FOOT 2ND, 3RD, 4TH, 5TH DIGITS   CARDIAC CATHETERIZATION  06-01-2009  dr Clifton Johaan Ryser   no evidence CAD,  normal LVSF, elevated blood pressure w/ elevated end-diastolic pressure, ef 60-65%   CARDIOVASCULAR STRESS TEST  05-11-2009   dr Clifton Jaydrien Wassenaar   abnormal lexiscan nuclear study (no exercise) w/ mild ischemia in the distal anteroseptal wall and apex, this defect may be due to shifting breast attenuation   COLONOSCOPY  last 02/08/2014   COLONOSCOPY WITH SNARE POLYPECTOMY WITH ESOPHAGOGASTRODUODENOSCOPY  02/08/2014   with biopsy Dr. Judie Petit. Russella Dar   CYSTOSCOPY WITH RETROGRADE PYELOGRAM, URETEROSCOPY AND STENT PLACEMENT Right 06/07/2017   Procedure: CYSTOSCOPY WITH  RETROGRADE PYELOGRAM, URETEROSCOPY, LITHOTRIPSY,  AND STENT PLACEMENT 1st STAGE;  Surgeon: Sebastian Ache, MD;  Location[facility];  Service: Urology;  Laterality: Right;   CYSTOSCOPY WITH RETROGRADE PYELOGRAM, URETEROSCOPY AND STENT PLACEMENT Right 06/21/2017   Procedure: CYSTOSCOPY WITH RETROGRADE PYELOGRAM, URETEROSCOPY AND STENT PLACEMENT 2ND STAGE  STENT" EXCHANGE";  Surgeon: Sebastian Ache, MD;  Location: [facility];  Service: Urology;  Laterality: Right;   CYSTOSCOPY WITH RETROGRADE PYELOGRAM, URETEROSCOPY AND STENT PLACEMENT Right 01/13/2021   Procedure: FIRST STAGE CYSTOSCOPY WITH RETROGRADE PYELOGRAM, URETEROSCOPY AND STENT PLACEMENT;  Surgeon: Sebastian Ache, MD;  Location: [facility];  Service: Urology;  Laterality: Right;  90 MINS   CYSTOSCOPY WITH RETROGRADE  PYELOGRAM, URETEROSCOPY AND STENT PLACEMENT Right 01/27/2021   Procedure: SECOND STAGE CYSTOSCOPY WITH RETROGRADE PYELOGRAM, URETEROSCOPY AND STENT EXCHANGE;  Surgeon: Sebastian Ache, MD;  Location: [facility];  Service: Urology;  Laterality: Right;  90 MINS   FRACTURE SURGERY Right    Compound fracture foot   FRACTURE SURGERY Right 1963   Pt had surgery to repair broken arm.   HOLMIUM LASER APPLICATION Right 06/07/2017   Procedure: HOLMIUM LASER APPLICATION;  Surgeon: Sebastian Ache, MD;  Location: [facility];  Service: Urology;  Laterality: Right;   HOLMIUM LASER APPLICATION Right 06/21/2017   Procedure: HOLMIUM LASER APPLICATION;  Surgeon: Sebastian Ache, MD;  Location: [facility];  Service: Urology;  Laterality: Right;   HOLMIUM LASER APPLICATION Right 01/13/2021   Procedure: HOLMIUM LASER APPLICATION;  Surgeon: Sebastian Ache, MD;  Location: [facility];  Service: Urology;  Laterality: Right;   HOLMIUM LASER APPLICATION Right 01/27/2021   Procedure: HOLMIUM LASER APPLICATION;  Surgeon: Sebastian Ache, MD;  Location: [facility];  Service: Urology;  Laterality: Right;   KNEE ARTHROSCOPY Left 1993   POLYPECTOMY     ROBOTIC ASSITED PARTIAL NEPHRECTOMY Right 05/19/2014   Procedure: ROBOTIC ASSITED PARTIAL NEPHRECTOMY;  Surgeon: Sebastian Ache, MD;  Location: [facility];  Service: Urology;  Laterality: Right;    reports that she quit smoking about 47 years ago. Her smoking use included cigarettes. She started smoking about 49 years ago. She has a 0.6 pack-year smoking history. She has never used smokeless tobacco. She reports that she does not drink alcohol and does not use drugs. family history includes Allergies in her sister; Asthma in her sister; Clotting disorder in her grandchild; Colon cancer (age of onset: 41) in her mother; Colon polyps in her mother; Diabetes in her mother; Heart attack in her father; Heart disease in her father and paternal  grandmother; Kidney disease in her mother. Allergies  Allergen Reactions   Erythromycin Nausea And Vomiting   Current Outpatient Medications on File Prior to Visit  Medication Sig Dispense Refill   albuterol (PROAIR HFA) 108 (90 Base) MCG/ACT inhaler Inhale 2 puffs into the lungs every 4 (four) hours as needed for wheezing or shortness of breath (cough). 3 each 3   atorvastatin (LIPITOR) 40 MG tablet TAKE 1 TABLET BY MOUTH EVERY DAY 90 tablet 1   cholecalciferol (VITAMIN D) 1000 units tablet Take 1,000 Units by mouth daily.     citalopram (CELEXA) 20 MG tablet TAKE 1 TABLET BY MOUTH EVERY DAY IN THE MORNING 90 tablet 2   collagenase (SANTYL) ointment Apply 1 application topically daily.     eszopiclone (LUNESTA) 2 MG TABS tablet TAKE 1 TABLET BY MOUTH IMMEDIATELY BEFORE BEDTIME AS NEEDED FOR SLEEP 90 tablet 1   fluticasone-salmeterol (ADVAIR) 100-50 MCG/ACT  AEPB Inhale 1 puff into the lungs 2 (two) times daily. (Patient taking differently: Inhale 1 puff into the lungs daily.) 3 each 3   gabapentin (NEURONTIN) 300 MG capsule TAKE 1 CAPSULE BY MOUTH EVERYDAY AT BEDTIME 90 capsule 1   levothyroxine (SYNTHROID) 125 MCG tablet TAKE 1 TABLET BY MOUTH EVERY DAY 90 tablet 3   losartan (COZAAR) 100 MG tablet TAKE 1 TABLET BY MOUTH EVERY DAY 90 tablet 3   metFORMIN (GLUCOPHAGE-XR) 500 MG 24 hr tablet Take 2 tablets (1,000 mg total) by mouth 2 (two) times daily. 360 tablet 1   metoprolol succinate (TOPROL-XL) 25 MG 24 hr tablet TAKE 1/2 TABLET BY MOUTH DAILY 45 tablet 1   ondansetron (ZOFRAN-ODT) 4 MG disintegrating tablet Take 1 tablet (4 mg total) by mouth every 8 (eight) hours as needed for nausea or vomiting. 20 tablet 0   Oxycodone HCl 10 MG TABS Take 1 tablet (10 mg total) by mouth every 6 (six) hours as needed (breakthrogh pain post-operatively). 10 tablet 0   OZEMPIC, 0.25 OR 0.5 MG/DOSE, 2 MG/3ML SOPN Inject into the skin.     RESTASIS 0.05 % ophthalmic emulsion Place 1 drop into both eyes 2  (two) times daily.     SUMAtriptan (IMITREX) 100 MG tablet Take 1 tablet (100 mg total) by mouth every 2 (two) hours as needed for migraine or headache. May repeat in 2 hours if headache persists or recurs. 10 tablet 5   No current facility-administered medications on file prior to visit.        ROS:  All others reviewed and negative.  Objective        PE:  BP 130/78 (BP Location: Left Arm, Patient Position: Sitting, Cuff Size: Normal)   Pulse 88   Temp 98.8 F (37.1 C) (Oral)   Ht 5\' 8"  (1.727 m)   Wt 206 lb (93.4 kg)   SpO2 98%   BMI 31.32 kg/m                 Constitutional: Pt appears in NAD               HENT: Head: NCAT.                Right Ear: External ear normal.                 Left Ear: External ear normal.                Eyes: . Pupils are equal, round, and reactive to light. Conjunctivae and EOM are normal               Nose: without d/c or deformity               Neck: Neck supple. Gross normal ROM               Cardiovascular: Normal rate and regular rhythm.                 Pulmonary/Chest: Effort normal and breath sounds without rales or wheezing.                Abd:  Soft, NT, ND, + BS, no organomegaly               Neurological: Pt is alert. At baseline orientation, motor grossly intact               Skin: Skin is warm. No rashes, no other new lesions, LE edema -  none               Psychiatric: Pt behavior is normal without agitation   Micro: none  Cardiac tracings I have personally interpreted today:  none  Pertinent Radiological findings (summarize): none   Lab Results  Component Value Date   WBC 8.3 10/18/2022   HGB 14.2 10/18/2022   HCT 42.9 10/18/2022   PLT 213.0 10/18/2022   GLUCOSE 111 (H) 10/18/2022   CHOL 112 10/18/2022   TRIG 124.0 10/18/2022   HDL 44.90 10/18/2022   LDLDIRECT 58.0 04/20/2021   LDLCALC 43 10/18/2022   ALT 20 10/18/2022   AST 20 10/18/2022   NA 140 10/18/2022   K 4.6 10/18/2022   CL 104 10/18/2022   CREATININE 1.01  10/18/2022   BUN 15 10/18/2022   CO2 28 10/18/2022   TSH 3.92 04/19/2022   INR 1.1 ratio (H) 05/23/2009   HGBA1C 5.4 10/18/2022   MICROALBUR 2.7 (H) 04/19/2022   Assessment/Plan:  STARLYN GABOUREL is a 67 y.o. White or Caucasian [1] female with  has a past medical history of Allergic rhinitis, Arthritis, Asthma, B12 deficiency, Cancer (HCC) (2016), CKD (chronic kidney disease) (04/20/2020), Depression, Grade I internal hemorrhoids, Hiatal hernia, History of colon polyps, History of kidney stones, History of renal cell carcinoma (urologist-  dr Berneice Heinrich), Hypercholesterolemia, Hypertension, Hypothyroidism, Iron deficiency anemia, Lumbar spondylosis, NAFLD (nonalcoholic fatty liver disease), Nocturia, OA (osteoarthritis of spine), OSA on CPAP (pulmologist-  dr Craige Cotta), Peripheral neuropathy, Renal calculus, right, Sleep apnea, Type 2 diabetes mellitus (HCC), Vitamin B 12 deficiency, and Wears glasses.  B12 deficiency Lab Results  Component Value Date   VITAMINB12 672 04/19/2022   Stable, cont oral replacement - b12 1000 mcg qd   Diabetes (HCC) Lab Results  Component Value Date   HGBA1C 5.4 10/18/2022   Stable, pt to continue current medical treatment farxiga 10 every day, metformin Er 500 - 2 bid and ozempic 0.25 weekly   Essential hypertension BP Readings from Last 3 Encounters:  10/18/22 130/78  08/23/22 139/70  06/28/22 (!) 140/88   Stable, pt to continue medical treatment losartan 100 every day, toprol xl 25 - 1/2 every day    HYPERCHOLESTEROLEMIA Lab Results  Component Value Date   LDLCALC 43 10/18/2022   Stable, pt to continue current statin lipitor 40 qd   Hypothyroidism Lab Results  Component Value Date   TSH 3.92 04/19/2022   Stable, pt to continue levothyroxine 125 mcg qd   Stage 3 chronic kidney disease Lab Results  Component Value Date   CREATININE 1.01 10/18/2022   Stable overall, cont to avoid nephrotoxins   Vitamin D deficiency Last vitamin D Lab  Results  Component Value Date   VD25OH 55.65 10/18/2022   Stable, cont oral replacement   Dysuria Mild to mod, for ua and culture, r/o uti,  to f/u any worsening symptoms or concerns  Insomnia Mild to mod worsening, for change lunesta to belsomra at bedtime prn,  to f/u any worsening symptoms or concerns  Followup: No follow-ups on file.  Oliver Barre, MD 10/20/2022 4:40 PM [facility]

## 2022-10-19 LAB — VITAMIN D 25 HYDROXY (VIT D DEFICIENCY, FRACTURES): VITD: 55.65 ng/mL (ref 30.00–100.00)

## 2022-10-19 NOTE — Progress Notes (Signed)
The test results show that your current treatment is OK, as the tests are stable.  Please continue the same plan.  There is no other need for change of treatment or further evaluation based on these results, at this time.  thanks 

## 2022-10-20 ENCOUNTER — Encounter: Payer: Self-pay | Admitting: Internal Medicine

## 2022-10-20 NOTE — Assessment & Plan Note (Signed)
BP Readings from Last 3 Encounters:  10/18/22 130/78  08/23/22 139/70  06/28/22 (!) 140/88   Stable, pt to continue medical treatment losartan 100 every day, toprol xl 25 - 1/2 every day

## 2022-10-20 NOTE — Assessment & Plan Note (Signed)
Lab Results  Component Value Date   HGBA1C 5.4 10/18/2022   Stable, pt to continue current medical treatment farxiga 10 every day, metformin Er 500 - 2 bid and ozempic 0.25 weekly

## 2022-10-20 NOTE — Assessment & Plan Note (Signed)
Lab Results  Component Value Date   TSH 3.92 04/19/2022   Stable, pt to continue levothyroxine 125 mcg qd

## 2022-10-20 NOTE — Assessment & Plan Note (Signed)
Lab Results  Component Value Date   CREATININE 1.01 10/18/2022   Stable overall, cont to avoid nephrotoxins

## 2022-10-20 NOTE — Assessment & Plan Note (Signed)
Last vitamin D Lab Results  Component Value Date   VD25OH 55.65 10/18/2022   Stable, cont oral replacement

## 2022-10-20 NOTE — Assessment & Plan Note (Signed)
Lab Results  Component Value Date   [id number] 04/19/2022   Stable, cont oral replacement - b12 1000 mcg qd

## 2022-10-20 NOTE — Assessment & Plan Note (Signed)
Mild to mod worsening, for change lunesta to belsomra at bedtime prn,  to f/u any worsening symptoms or concerns

## 2022-10-20 NOTE — Assessment & Plan Note (Signed)
Lab Results  Component Value Date   LDLCALC 43 10/18/2022   Stable, pt to continue current statin lipitor 40 qd

## 2022-10-20 NOTE — Assessment & Plan Note (Signed)
Mild to mod, for ua and culture, r/o uti,  to f/u any worsening symptoms or concerns

## 2022-10-22 LAB — URINE CULTURE

## 2022-10-22 LAB — THYROID PANEL WITH TSH

## 2022-10-24 ENCOUNTER — Encounter: Payer: Self-pay | Admitting: Internal Medicine

## 2022-10-24 ENCOUNTER — Other Ambulatory Visit: Payer: Self-pay | Admitting: Internal Medicine

## 2022-10-24 DIAGNOSIS — E039 Hypothyroidism, unspecified: Secondary | ICD-10-CM

## 2022-10-24 MED ORDER — ZOLPIDEM TARTRATE ER 12.5 MG PO TBCR: 12.5000 mg | EXTENDED_RELEASE_TABLET | Freq: Every evening | ORAL | 2 refills | Status: DC | PRN

## 2022-10-24 NOTE — Telephone Encounter (Signed)
Ok to let pt know  Pharmacy told us belsomra was not covered by her insurance, so I sent in Ambien cr 12.5 mg prn - done erx

## 2022-10-25 ENCOUNTER — Other Ambulatory Visit: Payer: Medicare Other

## 2022-10-25 DIAGNOSIS — E039 Hypothyroidism, unspecified: Secondary | ICD-10-CM

## 2022-10-26 ENCOUNTER — Other Ambulatory Visit: Payer: Self-pay | Admitting: Internal Medicine

## 2022-10-26 LAB — THYROID PANEL WITH TSH
Free Thyroxine Index: 4 — ABNORMAL HIGH (ref 1.4–3.8)
T3 Uptake: 31 % (ref 22–35)
T4, Total: 12.8 ug/dL — ABNORMAL HIGH (ref 5.1–11.9)
TSH: 0.16 m[IU]/L — ABNORMAL LOW (ref 0.40–4.50)

## 2022-10-26 MED ORDER — LEVOTHYROXINE SODIUM 112 MCG PO TABS: 112.0000 ug | ORAL_TABLET | Freq: Every day | ORAL | 3 refills | Status: DC

## 2022-10-31 ENCOUNTER — Other Ambulatory Visit: Payer: Self-pay

## 2022-10-31 ENCOUNTER — Other Ambulatory Visit: Payer: Self-pay | Admitting: Internal Medicine

## 2022-12-29 ENCOUNTER — Other Ambulatory Visit: Payer: Self-pay | Admitting: Internal Medicine

## 2022-12-31 ENCOUNTER — Other Ambulatory Visit: Payer: Self-pay

## 2023-01-23 ENCOUNTER — Other Ambulatory Visit: Payer: Self-pay

## 2023-01-23 ENCOUNTER — Other Ambulatory Visit: Payer: Self-pay | Admitting: Internal Medicine

## 2023-01-23 DIAGNOSIS — E119 Type 2 diabetes mellitus without complications: Secondary | ICD-10-CM

## 2023-01-25 ENCOUNTER — Other Ambulatory Visit: Payer: Self-pay | Admitting: Internal Medicine

## 2023-01-28 ENCOUNTER — Other Ambulatory Visit: Payer: Self-pay

## 2023-01-30 ENCOUNTER — Other Ambulatory Visit: Payer: Self-pay | Admitting: Internal Medicine

## 2023-02-27 ENCOUNTER — Other Ambulatory Visit: Payer: Self-pay | Admitting: Emergency Medicine

## 2023-02-28 ENCOUNTER — Telehealth: Payer: Self-pay

## 2023-02-28 MED ORDER — ZOLPIDEM TARTRATE ER 12.5 MG PO TBCR
12.5000 mg | EXTENDED_RELEASE_TABLET | Freq: Every evening | ORAL | 0 refills | Status: DC | PRN
Start: 2023-02-28 — End: 2023-04-08

## 2023-02-28 NOTE — Telephone Encounter (Signed)
Ok this is done thanks

## 2023-02-28 NOTE — Telephone Encounter (Signed)
 Copied from CRM [id number]. Topic: Clinical - Medication Question >> Feb 28, 2023 11:41 AM Burnard DEL wrote: Reason for CRM: patient called in to make an appointment due to her medication refill being denied. She is scheduled for 03/08/2023/She would like to know if her medication zolpidem  (AMBIEN  CR) 12.5 MG CR tablet could be filled before than so that she able to sleep? She would also like to know if theres any blood work that may need to be done before her appointment on 03/08/23.

## 2023-03-08 ENCOUNTER — Encounter: Payer: Self-pay | Admitting: Internal Medicine

## 2023-03-08 ENCOUNTER — Ambulatory Visit (INDEPENDENT_AMBULATORY_CARE_PROVIDER_SITE_OTHER): Payer: BC Managed Care – PPO | Admitting: Internal Medicine

## 2023-03-08 VITALS — BP 124/78 | HR 80 | Temp 97.8°F | Ht 68.0 in | Wt 215.0 lb

## 2023-03-08 DIAGNOSIS — E538 Deficiency of other specified B group vitamins: Secondary | ICD-10-CM | POA: Diagnosis not present

## 2023-03-08 DIAGNOSIS — E1165 Type 2 diabetes mellitus with hyperglycemia: Secondary | ICD-10-CM | POA: Diagnosis not present

## 2023-03-08 DIAGNOSIS — Z0001 Encounter for general adult medical examination with abnormal findings: Secondary | ICD-10-CM | POA: Insufficient documentation

## 2023-03-08 DIAGNOSIS — E559 Vitamin D deficiency, unspecified: Secondary | ICD-10-CM | POA: Diagnosis not present

## 2023-03-08 DIAGNOSIS — Z7985 Long-term (current) use of injectable non-insulin antidiabetic drugs: Secondary | ICD-10-CM

## 2023-03-08 DIAGNOSIS — I10 Essential (primary) hypertension: Secondary | ICD-10-CM

## 2023-03-08 DIAGNOSIS — E7800 Pure hypercholesterolemia, unspecified: Secondary | ICD-10-CM

## 2023-03-08 DIAGNOSIS — Z7984 Long term (current) use of oral hypoglycemic drugs: Secondary | ICD-10-CM

## 2023-03-08 LAB — MICROALBUMIN / CREATININE URINE RATIO
Creatinine,U: 73.4 mg/dL
Microalb Creat Ratio: 2.1 mg/g (ref 0.0–30.0)
Microalb, Ur: 1.5 mg/dL (ref 0.0–1.9)

## 2023-03-08 LAB — URINALYSIS, ROUTINE W REFLEX MICROSCOPIC
Bilirubin Urine: NEGATIVE
Hgb urine dipstick: NEGATIVE
Ketones, ur: NEGATIVE
Leukocytes,Ua: NEGATIVE
Nitrite: NEGATIVE
RBC / HPF: NONE SEEN (ref 0–?)
Specific Gravity, Urine: 1.02 (ref 1.000–1.030)
Total Protein, Urine: NEGATIVE
Urine Glucose: >=1000 — AB
Urobilinogen, UA: 0.2 (ref 0.0–1.0)
pH: 5.5 (ref 5.0–8.0)

## 2023-03-08 LAB — CBC WITH DIFFERENTIAL/PLATELET
Basophils Absolute: 0 10*3/uL (ref 0.0–0.1)
Basophils Relative: 0.5 % (ref 0.0–3.0)
Eosinophils Absolute: 0.4 10*3/uL (ref 0.0–0.7)
Eosinophils Relative: 6 % — ABNORMAL HIGH (ref 0.0–5.0)
HCT: 41.1 % (ref 36.0–46.0)
Hemoglobin: 13.8 g/dL (ref 12.0–15.0)
Lymphocytes Relative: 27.5 % (ref 12.0–46.0)
Lymphs Abs: 1.7 10*3/uL (ref 0.7–4.0)
MCHC: 33.5 g/dL (ref 30.0–36.0)
MCV: 89.4 fL (ref 78.0–100.0)
Monocytes Absolute: 0.3 10*3/uL (ref 0.1–1.0)
Monocytes Relative: 5.4 % (ref 3.0–12.0)
Neutro Abs: 3.7 10*3/uL (ref 1.4–7.7)
Neutrophils Relative %: 60.6 % (ref 43.0–77.0)
Platelets: 187 10*3/uL (ref 150.0–400.0)
RBC: 4.6 Mil/uL (ref 3.87–5.11)
RDW: 13.6 % (ref 11.5–15.5)
WBC: 6.2 10*3/uL (ref 4.0–10.5)

## 2023-03-08 LAB — BASIC METABOLIC PANEL
BUN: 18 mg/dL (ref 6–23)
CO2: 27 meq/L (ref 19–32)
Calcium: 9.8 mg/dL (ref 8.4–10.5)
Chloride: 105 meq/L (ref 96–112)
Creatinine, Ser: 1.01 mg/dL (ref 0.40–1.20)
GFR: 57.18 mL/min — ABNORMAL LOW (ref >60.00)
Glucose, Bld: 145 mg/dL — ABNORMAL HIGH (ref 70–99)
Potassium: 4.1 meq/L (ref 3.5–5.1)
Sodium: 140 meq/L (ref 135–145)

## 2023-03-08 LAB — HEPATIC FUNCTION PANEL
ALT: 19 U/L (ref 0–35)
AST: 22 U/L (ref 0–37)
Albumin: 4.5 g/dL (ref 3.5–5.2)
Alkaline Phosphatase: 77 U/L (ref 39–117)
Bilirubin, Direct: 0.3 mg/dL (ref 0.0–0.3)
Total Bilirubin: 1.8 mg/dL — ABNORMAL HIGH (ref 0.2–1.2)
Total Protein: 7.3 g/dL (ref 6.0–8.3)

## 2023-03-08 LAB — LIPID PANEL
Cholesterol: 137 mg/dL (ref 0–200)
HDL: 53.7 mg/dL (ref >39.00)
LDL Cholesterol: 54 mg/dL (ref 0–99)
NonHDL: 83.34
Total CHOL/HDL Ratio: 3
Triglycerides: 145 mg/dL (ref 0.0–149.0)
VLDL: 29 mg/dL (ref 0.0–40.0)

## 2023-03-08 LAB — TSH: TSH: 1.29 u[IU]/mL (ref 0.35–5.50)

## 2023-03-08 LAB — HEMOGLOBIN A1C: Hgb A1c MFr Bld: 7 % — ABNORMAL HIGH (ref 4.6–6.5)

## 2023-03-08 LAB — VITAMIN B12: Vitamin B-12: 271 pg/mL (ref 211–911)

## 2023-03-08 LAB — VITAMIN D 25 HYDROXY (VIT D DEFICIENCY, FRACTURES): VITD: 34.75 ng/mL (ref 30.00–100.00)

## 2023-03-08 MED ORDER — OZEMPIC (0.25 OR 0.5 MG/DOSE) 2 MG/3ML ~~LOC~~ SOPN: PEN_INJECTOR | SUBCUTANEOUS | 11 refills | Status: DC

## 2023-03-08 NOTE — Patient Instructions (Addendum)
 Ok to restart the Ozempic , and let us  know in 1 month if you need the next higher dose  Please continue all other medications as before, and refills have been done if requested.  Please have the pharmacy call with any other refills you may need.  Please continue your efforts at being more active, low cholesterol diet, and weight control.  You are otherwise up to date with prevention measures today.  Please keep your appointments with your specialists as you may have planned  Please go to the LAB at the blood drawing area for the tests to be done  You will be contacted by phone if any changes need to be made immediately.  Otherwise, you will receive a letter about your results with an explanation, but please check with MyChart first.  Please make an Appointment to return in 6 months, or sooner if needed

## 2023-03-08 NOTE — Progress Notes (Signed)
 Patient ID: Savannah [name], female   DOB: 1955/06/06, 68 y.o.   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 female here for wellness exam; decliens all immunizations, plans for mammogram soon with GYN, o/w up to date                        Also out of ozempci x 2 mo due to cost.  But willitng to restart with the new year  Pt denies chest pain, increased sob or doe, wheezing, orthopnea, PND, increased LE swelling, palpitations, or syncope but did have mild dizziness intermittent last wk.  Does c/o ongoing fatigue, but denies signficant daytime hypersomnolence.   Pt denies polydipsia, polyuria, or new focal neuro s/s.    Pt denies fever, wt loss, night sweats, loss of appetite, or other constitutional symptoms     Wt Readings from Last 3 Encounters:  03/08/23 215 lb (97.5 kg)  10/18/22 206 lb (93.4 kg)  08/23/22 215 lb (97.5 kg)   BP Readings from Last 3 Encounters:  03/08/23 124/78  10/18/22 130/78  08/23/22 139/70   Immunization History  Administered Date(s) Administered   Fluad Quad(high Dose 65+) 12/09/2019, 12/31/2020, 01/24/2022   Influenza Inj Mdck Quad Pf 12/20/2016   Influenza Split 12/29/2010, 12/28/2011   Influenza Whole 12/16/2007, 12/06/2008, 12/26/2009   Influenza,inj,Quad PF,6+ Mos 04/15/2015, 01/06/2016, 11/07/2017, 02/04/2019   Influenza-Unspecified 11/26/2013, 12/27/2016   PFIZER(Purple Top)SARS-COV-2 Vaccination 05/22/2019, 06/15/2019, 01/01/2020   Pneumococcal Conjugate-13 10/16/2019   Pneumococcal Polysaccharide-23 05/09/2010, 04/17/2017   Td 11/16/2004   Tdap 04/17/2016   Zoster, Live 04/15/2015   Health Maintenance Due  Topic Date Due   Zoster Vaccines- Shingrix (1 of 2) 08/15/1973   MAMMOGRAM  10/19/2020   Pneumonia Vaccine 44+ Years old (4 of 4 - PPSV23 or PCV20) 04/17/2022    COVID-19 Vaccine (4 - 2024-25 season) 10/28/2022      Past Medical History:  Diagnosis Date   Allergic rhinitis    Arthritis    arthritis in back   Asthma    B12 deficiency    Cancer (HCC) 2016   kidney    CKD (chronic kidney disease) 04/20/2020   stage 3 per OV note on 04/20/20 from Dr. 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   Hypercholesterolemia    Hypertension    Hypothyroidism    endocrinologist -  dr ellision   Iron deficiency anemia    Lumbar spondylosis    NAFLD (nonalcoholic fatty liver disease)    dx 2010--- last hepatic panel in epic dated 04-17-2017   Nocturia    OA (osteoarthritis of spine)    Lumbar   OSA on CPAP pulmologist-  dr shellia   sleep study 09-11-2004  very severe osa , AHI 119/hr,  setting 10   Peripheral neuropathy    bottom of feet   Renal calculus, right    Sleep apnea  uses CPAP   Type 2 diabetes mellitus (HCC)    endocrinoloigst-  dr kassie,  last A1c 5.9 on 02-20-219 in epic   Vitamin B 12 deficiency    Wears glasses    Past Surgical History:  Procedure Laterality Date   BUNIONECTOMY/  HAMMERTOE CORRECTION'S  09-07-2013   SCG   RIGHT FOOT 2ND, 3RD, 4TH, 5TH DIGITS   CARDIAC CATHETERIZATION  06-01-2009  dr verlin   no evidence CAD,  normal LVSF, elevated blood pressure w/ elevated end-diastolic pressure, ef 60-65%   CARDIOVASCULAR STRESS TEST  05-11-2009   dr verlin   abnormal lexiscan  nuclear study (no exercise) w/ mild ischemia in the distal anteroseptal wall and apex, this defect may be due to shifting breast attenuation   COLONOSCOPY  last 02/08/2014   COLONOSCOPY WITH SNARE POLYPECTOMY WITH ESOPHAGOGASTRODUODENOSCOPY  02/08/2014   with biopsy Dr.  CHRISTELLA. Aneita   CYSTOSCOPY WITH RETROGRADE PYELOGRAM, URETEROSCOPY AND STENT PLACEMENT Right 06/07/2017   Procedure: CYSTOSCOPY WITH RETROGRADE PYELOGRAM, URETEROSCOPY, LITHOTRIPSY,  AND STENT PLACEMENT 1st STAGE;  Surgeon: Alvaro Hummer, MD;  Location[facility];  Service: Urology;  Laterality: Right;   CYSTOSCOPY WITH RETROGRADE PYELOGRAM, URETEROSCOPY AND STENT PLACEMENT Right 06/21/2017   Procedure: CYSTOSCOPY WITH RETROGRADE PYELOGRAM, URETEROSCOPY AND STENT PLACEMENT 2ND STAGE  STENT EXCHANGE;  Surgeon: Alvaro Hummer, MD;  Location: [facility];  Service: Urology;  Laterality: Right;   CYSTOSCOPY WITH RETROGRADE PYELOGRAM, URETEROSCOPY AND STENT PLACEMENT Right 01/13/2021   Procedure: FIRST STAGE CYSTOSCOPY WITH RETROGRADE PYELOGRAM, URETEROSCOPY AND STENT PLACEMENT;  Surgeon: Alvaro Hummer, MD;  Location: [facility];  Service: Urology;  Laterality: Right;  90 MINS   CYSTOSCOPY WITH RETROGRADE PYELOGRAM, URETEROSCOPY AND STENT PLACEMENT Right 01/27/2021   Procedure: SECOND STAGE CYSTOSCOPY WITH RETROGRADE PYELOGRAM, URETEROSCOPY AND STENT EXCHANGE;  Surgeon: Alvaro Hummer, MD;  Location: [facility];  Service: Urology;  Laterality: Right;  90 MINS   FRACTURE SURGERY Right    Compound fracture foot   FRACTURE SURGERY Right 1963   Pt had surgery to repair broken arm.   HOLMIUM LASER APPLICATION Right 06/07/2017   Procedure: HOLMIUM LASER APPLICATION;  Surgeon: Alvaro Hummer, MD;  Location: [facility];  Service: Urology;  Laterality: Right;   HOLMIUM LASER APPLICATION Right 06/21/2017   Procedure: HOLMIUM LASER APPLICATION;  Surgeon: Alvaro Hummer, MD;  Location: [facility];  Service: Urology;  Laterality: Right;   HOLMIUM LASER APPLICATION Right 01/13/2021   Procedure: HOLMIUM LASER APPLICATION;  Surgeon: Alvaro Hummer, MD;  Location: [facility];  Service: Urology;  Laterality: Right;   HOLMIUM  LASER APPLICATION Right 01/27/2021   Procedure: HOLMIUM LASER APPLICATION;  Surgeon: Alvaro Hummer, MD;  Location: [facility];  Service: Urology;  Laterality: Right;   KNEE ARTHROSCOPY Left 1993   POLYPECTOMY     ROBOTIC ASSITED PARTIAL NEPHRECTOMY Right 05/19/2014   Procedure: ROBOTIC ASSITED PARTIAL NEPHRECTOMY;  Surgeon: Hummer Alvaro, MD;  Location: [facility];  Service: Urology;  Laterality: Right;    reports that she quit smoking about 48 years ago. Her smoking use included cigarettes. She started smoking about 50 years ago. She has a 0.6 pack-year smoking history. She has never used smokeless tobacco. She reports that she does not drink alcohol and does not use drugs. family history includes Allergies in her sister; Asthma in her sister; Clotting disorder in her grandchild; Colon cancer (age of onset: 34) in her mother; Colon polyps in her mother; Diabetes in her mother;  Heart attack in her father; Heart disease in her father and paternal grandmother; Kidney disease in her mother. Allergies  Allergen Reactions   Erythromycin Nausea And Vomiting   Current Outpatient Medications on File Prior to Visit  Medication Sig Dispense Refill   albuterol  (PROAIR  HFA) 108 (90 Base) MCG/ACT inhaler Inhale 2 puffs into the lungs every 4 (four) hours as needed for wheezing or shortness of breath (cough). 3 each 3   atorvastatin  (LIPITOR) 40 MG tablet TAKE 1 TABLET BY MOUTH EVERY DAY 90 tablet 1   cholecalciferol (VITAMIN D ) 1000 units tablet Take 1,000 Units by mouth daily.     citalopram  (CELEXA ) 20 MG tablet TAKE 1 TABLET BY MOUTH EVERY DAY IN THE MORNING 90 tablet 2   collagenase (SANTYL) ointment Apply 1 application topically daily.     eszopiclone  (LUNESTA ) 2 MG TABS tablet TAKE 1 TABLET BY MOUTH IMMEDIATELY BEFORE BEDTIME AS NEEDED FOR SLEEP 90 tablet 1   FARXIGA  10 MG TABS tablet TAKE 1 TABLET BY MOUTH DAILY BEFORE BREAKFAST. 90 tablet 3   fluticasone -salmeterol (ADVAIR) 100-50 MCG/ACT AEPB Inhale 1  puff into the lungs 2 (two) times daily. (Patient taking differently: Inhale 1 puff into the lungs daily.) 3 each 3   gabapentin  (NEURONTIN ) 300 MG capsule TAKE 1 CAPSULE BY MOUTH EVERYDAY AT BEDTIME 90 capsule 1   levothyroxine  (SYNTHROID ) 112 MCG tablet Take 1 tablet (112 mcg total) by mouth daily. 90 tablet 3   losartan  (COZAAR ) 100 MG tablet TAKE 1 TABLET BY MOUTH EVERY DAY 90 tablet 3   metFORMIN  (GLUCOPHAGE -XR) 500 MG 24 hr tablet TAKE 2 TABLETS BY MOUTH TWICE A DAY 360 tablet 3   metoprolol  succinate (TOPROL -XL) 25 MG 24 hr tablet TAKE 1/2 TABLET BY MOUTH DAILY 45 tablet 1   ondansetron  (ZOFRAN -ODT) 4 MG disintegrating tablet Take 1 tablet (4 mg total) by mouth every 8 (eight) hours as needed for nausea or vomiting. 20 tablet 0   Oxycodone  HCl 10 MG TABS Take 1 tablet (10 mg total) by mouth every 6 (six) hours as needed (breakthrogh pain post-operatively). 10 tablet 0   RESTASIS 0.05 % ophthalmic emulsion Place 1 drop into both eyes 2 (two) times daily.     SUMAtriptan  (IMITREX ) 100 MG tablet Take 1 tablet (100 mg total) by mouth every 2 (two) hours as needed for migraine or headache. May repeat in 2 hours if headache persists or recurs. 10 tablet 5   zolpidem  (AMBIEN  CR) 12.5 MG CR tablet Take 1 tablet (12.5 mg total) by mouth at bedtime as needed. for sleep 30 tablet 0   No current facility-administered medications on file prior to visit.        ROS:  All others reviewed and negative.  Objective        PE:  BP 124/78 (BP Location: Right Arm, Patient Position: Sitting, Cuff Size: Normal)   Pulse 80   Temp 97.8 F (36.6 C) (Oral)   Ht 5' 8 (1.727 m)   Wt 215 lb (97.5 kg)   SpO2 98%   BMI 32.69 kg/m                 Constitutional: Pt appears in NAD               HENT: Head: NCAT.                Right Ear: External ear normal.  Left Ear: External ear normal.                Eyes: . Pupils are equal, round, and reactive to light. Conjunctivae and EOM are normal                Nose: without d/c or deformity               Neck: Neck supple. Gross normal ROM               Cardiovascular: Normal rate and regular rhythm.                 Pulmonary/Chest: Effort normal and breath sounds without rales or wheezing.                Abd:  Soft, NT, ND, + BS, no organomegaly               Neurological: Pt is alert. At baseline orientation, motor grossly intact               Skin: Skin is warm. No rashes, no other new lesions, LE edema - none               Psychiatric: Pt behavior is normal without agitation   Micro: none  Cardiac tracings I have personally interpreted today:  none  Pertinent Radiological findings (summarize): none   Lab Results  Component Value Date   WBC 6.2 03/08/2023   HGB 13.8 03/08/2023   HCT 41.1 03/08/2023   PLT 187.0 03/08/2023   GLUCOSE 145 (H) 03/08/2023   CHOL 137 03/08/2023   TRIG 145.0 03/08/2023   HDL 53.70 03/08/2023   LDLDIRECT 58.0 04/20/2021   LDLCALC 54 03/08/2023   ALT 19 03/08/2023   AST 22 03/08/2023   NA 140 03/08/2023   K 4.1 03/08/2023   CL 105 03/08/2023   CREATININE 1.01 03/08/2023   BUN 18 03/08/2023   CO2 27 03/08/2023   TSH 1.29 03/08/2023   INR 1.1 ratio (H) 05/23/2009   HGBA1C 7.0 (H) 03/08/2023   MICROALBUR 1.5 03/08/2023   Assessment/Plan:  Savannah [name] is a 68 y.o. White or Caucasian [1] female with  has a past medical history of Allergic rhinitis, Arthritis, Asthma, B12 deficiency, Cancer (HCC) (2016), CKD (chronic kidney disease) (04/20/2020), Depression, Grade I internal hemorrhoids, Hiatal hernia, History of colon polyps, History of kidney stones, History of renal cell carcinoma (urologist-  dr alvaro), Hypercholesterolemia, Hypertension, Hypothyroidism, Iron deficiency anemia, Lumbar spondylosis, NAFLD (nonalcoholic fatty liver disease), Nocturia, OA (osteoarthritis of spine), OSA on CPAP (pulmologist-  dr shellia), Peripheral neuropathy, Renal calculus, right, Sleep apnea, Type 2 diabetes  mellitus (HCC), Vitamin B 12 deficiency, and Wears glasses.  Encounter for well adult exam with abnormal findings Age and sex appropriate education and counseling updated with regular exercise and diet Referrals for preventative services - pt for mammogram with Gyn soon Immunizations addressed - declines all Smoking counseling  - none needed Evidence for depression or other mood disorder - none significant Most recent labs reviewed. I have personally reviewed and have noted: 1) the patient's medical and social history 2) The patient's current medications and supplements 3) The patient's height, weight, and BMI have been recorded in the chart   B12 deficiency Lab Results  Component Value Date   VITAMINB12 271 03/08/2023   Low, to start oral replacement - b12 1000 mcg qd   Diabetes (HCC) Lab Results  Component Value Date   HGBA1C 7.0 (  H) 03/08/2023   uncontrolled, pt to continue current medical treatment farxiga  10 every day, metformin  ER 500 mg - 2 bid, and restart ozemipc 0.5 mg weekly   Essential hypertension BP Readings from Last 3 Encounters:  03/08/23 124/78  10/18/22 130/78  08/23/22 139/70   Stable, pt to continue medical treatment losartan  100 mg every day, toprol  xl 25 mg 1/2 qd   HYPERCHOLESTEROLEMIA Lab Results  Component Value Date   LDLCALC 54 03/08/2023   Stable, pt to continue current statin lipitor 40 qd   Vitamin D  deficiency Last vitamin D  Lab Results  Component Value Date   VD25OH 34.75 03/08/2023   Low, to start oral replacement  Followup: Return in about 6 months (around 09/05/2023).  Lynwood Rush, MD 03/10/2023 9:59 PM [facility]

## 2023-03-10 ENCOUNTER — Encounter: Payer: Self-pay | Admitting: Internal Medicine

## 2023-03-10 NOTE — Assessment & Plan Note (Signed)
 Last vitamin D Lab Results  Component Value Date   VD25OH 34.75 03/08/2023   Low, to start oral replacement

## 2023-03-10 NOTE — Assessment & Plan Note (Signed)
 Lab Results  Component Value Date   LDLCALC 54 03/08/2023   Stable, pt to continue current statin lipitor 40 qd

## 2023-03-10 NOTE — Assessment & Plan Note (Signed)
 BP Readings from Last 3 Encounters:  03/08/23 124/78  10/18/22 130/78  08/23/22 139/70   Stable, pt to continue medical treatment losartan 100 mg every day, toprol xl 25 mg 1/2 qd

## 2023-03-10 NOTE — Assessment & Plan Note (Signed)
 Lab Results  Component Value Date   VITAMINB12 271 03/08/2023   Low, to start oral replacement - b12 1000 mcg qd

## 2023-03-10 NOTE — Assessment & Plan Note (Signed)
 Age and sex appropriate education and counseling updated with regular exercise and diet Referrals for preventative services - pt for mammogram with Gyn soon Immunizations addressed - declines all Smoking counseling  - none needed Evidence for depression or other mood disorder - none significant Most recent labs reviewed. I have personally reviewed and have noted: 1) the patient's medical and social history 2) The patient's current medications and supplements 3) The patient's height, weight, and BMI have been recorded in the chart

## 2023-03-10 NOTE — Assessment & Plan Note (Signed)
 Lab Results  Component Value Date   HGBA1C 7.0 (H) 03/08/2023   uncontrolled, pt to continue current medical treatment farxiga 10 every day, metformin ER 500 mg - 2 bid, and restart ozemipc 0.5 mg weekly

## 2023-03-27 ENCOUNTER — Other Ambulatory Visit: Payer: Self-pay | Admitting: Internal Medicine

## 2023-03-28 ENCOUNTER — Other Ambulatory Visit: Payer: Self-pay

## 2023-04-06 ENCOUNTER — Other Ambulatory Visit: Payer: Self-pay | Admitting: Internal Medicine

## 2023-04-22 LAB — HM DIABETES EYE EXAM

## 2023-04-23 DIAGNOSIS — Z9622 Myringotomy tube(s) status: Secondary | ICD-10-CM | POA: Insufficient documentation

## 2023-04-27 ENCOUNTER — Other Ambulatory Visit: Payer: Self-pay | Admitting: Internal Medicine

## 2023-04-27 DIAGNOSIS — N1831 Chronic kidney disease, stage 3a: Secondary | ICD-10-CM

## 2023-04-27 DIAGNOSIS — N183 Chronic kidney disease, stage 3 unspecified: Secondary | ICD-10-CM

## 2023-04-29 ENCOUNTER — Other Ambulatory Visit: Payer: Self-pay

## 2023-07-10 ENCOUNTER — Other Ambulatory Visit: Payer: Self-pay | Admitting: Internal Medicine

## 2023-07-10 DIAGNOSIS — E119 Type 2 diabetes mellitus without complications: Secondary | ICD-10-CM

## 2023-07-31 ENCOUNTER — Encounter: Payer: Self-pay | Admitting: Podiatry

## 2023-07-31 ENCOUNTER — Ambulatory Visit (INDEPENDENT_AMBULATORY_CARE_PROVIDER_SITE_OTHER): Payer: Self-pay | Admitting: Podiatry

## 2023-07-31 DIAGNOSIS — L97512 Non-pressure chronic ulcer of other part of right foot with fat layer exposed: Secondary | ICD-10-CM

## 2023-07-31 NOTE — Progress Notes (Signed)
 Patient presents follow-up ulcer hallux right.  She has been having problems with it for the past several months I have not seen her in about 2 months.  She has been putting Santyl on and soaking it once a day.  Has not noticed any signs of infection.  Physical Exam:  Patient alert and oriented x 3.  No complaints of nausea, vomiting, fever, or chills  Vascular: DP pulses 2. PT pulses 1.  Moderate edema. Capillary fill time immediate.  Dermatologic: Full-thickness deep ulceration into subcutaneous tissue hallux right. . Measures 16 mm wide x 8 mm long x 5 deep.  Clear drainage.  No erythema.  Moderate exudate.  No undermining.  Ulcer base is about half granulation tissue  Neurologic:   Musculoskeletal: Abductovalgus Forni right.  Radiographs: None today  Diagnoses: 1.  Full-thickness deep ulceration into the subcutaneous tissue great toe right foot ulcer with no signs of infection   Plan: -Discussed the ulcer we will have her continue soaking it for 15 minutes warm Epsom salt water  daily applying Santyl and a light wet-to-dry dressing. - Dispensed surgical shoe right - Sharply debrided devitalized tissue from the deep ulceration into the subcutaneous tissue.  Debrided good bleeding base.  Applied triple antibiotic and a gauze dressing.    Return 2 weeks f/u ulcer

## 2023-08-01 ENCOUNTER — Other Ambulatory Visit: Payer: Self-pay

## 2023-08-01 MED ORDER — GABAPENTIN 300 MG PO CAPS: ORAL_CAPSULE | ORAL | 1 refills | Status: DC

## 2023-08-14 ENCOUNTER — Encounter: Payer: Self-pay | Admitting: Podiatry

## 2023-08-14 ENCOUNTER — Ambulatory Visit (INDEPENDENT_AMBULATORY_CARE_PROVIDER_SITE_OTHER): Admitting: Podiatry

## 2023-08-14 DIAGNOSIS — L97512 Non-pressure chronic ulcer of other part of right foot with fat layer exposed: Secondary | ICD-10-CM | POA: Diagnosis not present

## 2023-08-14 NOTE — Progress Notes (Signed)
 JAP ulceration hallux left.  Is just noticed clear to yellowish drainage.  No purulence or signs of infection.  Still not wearing the surgical shoe  Physical Exam:  Patient alert and oriented x 3.  No complaints of nausea, vomiting, fever, or chills  Vascular: DP pulses 2/4 bilateral. PT pulses 1/4 lateral.  Bilateral lower extremity moderate edema. Capillary fill time immediate.  Dermatologic: Deep full-thickness ulceration penetrating the subcutaneous tissue plantar medial aspect hallux right. Measures 10 mm wide x 6 mm long x 3 deep.  Clear drainage.  No erythema.  Moderate exudate.  None undermining.  Granular granulation tissue in the ulcer base  Neurologic:   Musculoskeletal:   Diagnoses: 1.  Deep full-thickness ulceration penetrating the subcutaneous tissue hallux right-improved.  Plan: -Sharply debrided deep full-thickness ulceration into the subcutaneous tissue.  Debrided any devitalized tissue into the subcutaneous tissue.  Debrided down to a good bleeding base.  Applied triple antibiotic ointment and a light dressing. - Continue soaking daily for 15 minutes warm Epsom salt water , apply Santyl ointment, and apply a light dressing. -Again encouraged to wear surgical shoe   Return 2 f/u ulcer

## 2023-08-28 ENCOUNTER — Encounter: Payer: Self-pay | Admitting: Podiatry

## 2023-08-28 ENCOUNTER — Ambulatory Visit (INDEPENDENT_AMBULATORY_CARE_PROVIDER_SITE_OTHER): Admitting: Podiatry

## 2023-08-28 DIAGNOSIS — L97502 Non-pressure chronic ulcer of other part of unspecified foot with fat layer exposed: Secondary | ICD-10-CM

## 2023-08-28 NOTE — Progress Notes (Signed)
 Patient presents follow-up ulcer hallux right.  No F/C or N/V.  Physical Exam:  Patient alert and oriented x 3.  No complaints of nausea, vomiting, fever, or chills  Vascular: DP pulses 2/4 bilateral. PT pulses 1/4 lateral.  Moderate edema. Capillary fill time immediate bilaterally.  Dermatologic: Full-thickness ulceration penetrating subcutaneous tissue plantar medial aspect hallux right   . Measures 9 mm wide x 5 mm long x 3 deep.  Clear drainage.  No erythema.  Moderate exudate.  No undermining.  Good base of granulation tissue  Neurologic:   Musculokeletal:    Diagnoses: 1.  Deep full-thickness ulceration penetrating the subcutaneous tissue hallux right-improved.  Plan: -Continue wound care soaking twice daily warm Epsom salt water , apply Bactroban, and a light.  Again recommended wearing surgical shoe for all weightbearing -Sharp debridement full-thickness ulceration subcutaneous tissue plantar medial aspect hallux right.  Ridding devitalized tissue with good bleeding base.  Antibiotic ointment and a light dressing.   Return 2 weeks f/u ulcer

## 2023-09-05 ENCOUNTER — Ambulatory Visit (INDEPENDENT_AMBULATORY_CARE_PROVIDER_SITE_OTHER): Payer: PRIVATE HEALTH INSURANCE | Admitting: Internal Medicine

## 2023-09-05 ENCOUNTER — Encounter: Payer: Self-pay | Admitting: Internal Medicine

## 2023-09-05 VITALS — BP 122/66 | HR 85 | Temp 98.0°F | Ht 68.0 in | Wt 197.8 lb

## 2023-09-05 DIAGNOSIS — I10 Essential (primary) hypertension: Secondary | ICD-10-CM

## 2023-09-05 DIAGNOSIS — E1165 Type 2 diabetes mellitus with hyperglycemia: Secondary | ICD-10-CM | POA: Diagnosis not present

## 2023-09-05 DIAGNOSIS — E538 Deficiency of other specified B group vitamins: Secondary | ICD-10-CM

## 2023-09-05 DIAGNOSIS — E559 Vitamin D deficiency, unspecified: Secondary | ICD-10-CM | POA: Diagnosis not present

## 2023-09-05 DIAGNOSIS — J309 Allergic rhinitis, unspecified: Secondary | ICD-10-CM

## 2023-09-05 DIAGNOSIS — E7800 Pure hypercholesterolemia, unspecified: Secondary | ICD-10-CM | POA: Diagnosis not present

## 2023-09-05 DIAGNOSIS — N1831 Chronic kidney disease, stage 3a: Secondary | ICD-10-CM | POA: Diagnosis not present

## 2023-09-05 LAB — URINALYSIS, ROUTINE W REFLEX MICROSCOPIC
Bilirubin Urine: NEGATIVE
Hgb urine dipstick: NEGATIVE
Ketones, ur: NEGATIVE
Nitrite: NEGATIVE
RBC / HPF: NONE SEEN (ref 0–?)
Specific Gravity, Urine: 1.02 (ref 1.000–1.030)
Total Protein, Urine: NEGATIVE
Urine Glucose: >=1000 — AB
Urobilinogen, UA: 0.2 (ref 0.0–1.0)
pH: 6 (ref 5.0–8.0)

## 2023-09-05 LAB — HEPATIC FUNCTION PANEL
ALT: 14 U/L (ref 0–35)
AST: 18 U/L (ref 0–37)
Albumin: 4.7 g/dL (ref 3.5–5.2)
Alkaline Phosphatase: 70 U/L (ref 39–117)
Bilirubin, Direct: 0.3 mg/dL (ref 0.0–0.3)
Total Bilirubin: 2 mg/dL — ABNORMAL HIGH (ref 0.2–1.2)
Total Protein: 7.5 g/dL (ref 6.0–8.3)

## 2023-09-05 LAB — CBC WITH DIFFERENTIAL/PLATELET
Basophils Absolute: 0 K/uL (ref 0.0–0.1)
Basophils Relative: 0.5 % (ref 0.0–3.0)
Eosinophils Absolute: 0.3 K/uL (ref 0.0–0.7)
Eosinophils Relative: 3.2 % (ref 0.0–5.0)
HCT: 41.4 % (ref 36.0–46.0)
Hemoglobin: 14.2 g/dL (ref 12.0–15.0)
Lymphocytes Relative: 25 % (ref 12.0–46.0)
Lymphs Abs: 2.3 K/uL (ref 0.7–4.0)
MCHC: 34.3 g/dL (ref 30.0–36.0)
MCV: 86.2 fl (ref 78.0–100.0)
Monocytes Absolute: 0.5 K/uL (ref 0.1–1.0)
Monocytes Relative: 5.3 % (ref 3.0–12.0)
Neutro Abs: 6 K/uL (ref 1.4–7.7)
Neutrophils Relative %: 66 % (ref 43.0–77.0)
Platelets: 235 K/uL (ref 150.0–400.0)
RBC: 4.8 Mil/uL (ref 3.87–5.11)
RDW: 14.3 % (ref 11.5–15.5)
WBC: 9.1 K/uL (ref 4.0–10.5)

## 2023-09-05 LAB — BASIC METABOLIC PANEL WITH GFR
BUN: 23 mg/dL (ref 6–23)
CO2: 27 meq/L (ref 19–32)
Calcium: 10 mg/dL (ref 8.4–10.5)
Chloride: 100 meq/L (ref 96–112)
Creatinine, Ser: 1.03 mg/dL (ref 0.40–1.20)
GFR: 55.66 mL/min — ABNORMAL LOW (ref >60.00)
Glucose, Bld: 104 mg/dL — ABNORMAL HIGH (ref 70–99)
Potassium: 4.1 meq/L (ref 3.5–5.1)
Sodium: 137 meq/L (ref 135–145)

## 2023-09-05 LAB — LIPID PANEL
Cholesterol: 131 mg/dL (ref 0–200)
HDL: 53.5 mg/dL (ref >39.00)
LDL Cholesterol: 49 mg/dL (ref 0–99)
NonHDL: 77.31
Total CHOL/HDL Ratio: 2
Triglycerides: 141 mg/dL (ref 0.0–149.0)
VLDL: 28.2 mg/dL (ref 0.0–40.0)

## 2023-09-05 LAB — MICROALBUMIN / CREATININE URINE RATIO
Creatinine,U: 107.8 mg/dL
Microalb Creat Ratio: 13.1 mg/g (ref 0.0–30.0)
Microalb, Ur: 1.4 mg/dL (ref 0.0–1.9)

## 2023-09-05 LAB — VITAMIN B12: Vitamin B-12: 174 pg/mL — ABNORMAL LOW (ref 211–911)

## 2023-09-05 LAB — HEMOGLOBIN A1C: Hgb A1c MFr Bld: 5.2 % (ref 4.6–6.5)

## 2023-09-05 LAB — VITAMIN D 25 HYDROXY (VIT D DEFICIENCY, FRACTURES): VITD: 30.29 ng/mL (ref 30.00–100.00)

## 2023-09-05 LAB — TSH: TSH: 0.44 u[IU]/mL (ref 0.35–5.50)

## 2023-09-05 MED ORDER — ATORVASTATIN CALCIUM 40 MG PO TABS: 40.0000 mg | ORAL_TABLET | Freq: Every day | ORAL | 3 refills | Status: AC

## 2023-09-05 MED ORDER — SUMATRIPTAN SUCCINATE 100 MG PO TABS: 100.0000 mg | ORAL_TABLET | ORAL | 5 refills | Status: AC | PRN

## 2023-09-05 NOTE — Patient Instructions (Signed)
 Please have your Shingrix (shingles) shots done at your local pharmacy.  Please continue all other medications as before, and refills have been done if requested.  Please have the pharmacy call with any other refills you may need.  Please continue your efforts at being more active, low cholesterol diet, and weight control.  Please keep your appointments with your specialists as you may have planned  Please go to the LAB at the blood drawing area for the tests to be done  You will be contacted by phone if any changes need to be made immediately.  Otherwise, you will receive a letter about your results with an explanation, but please check with MyChart first.  Please make an Appointment to return in 6 months, or sooner if needed

## 2023-09-05 NOTE — Progress Notes (Signed)
 Chief Complaint: follow up HTN, HLD and DM, ckd3a, low vit d       HPI:  Savannah [name] is a 68 y.o. female here overall doing ok, Pt denies chest pain, increased sob or doe, wheezing, orthopnea, PND, increased LE swelling, palpitations, dizziness or syncope.   Pt denies polydipsia, polyuria, or new focal neuro s/s.    Pt denies fever, wt loss, night sweats, loss of appetite, or other constitutional symptoms  Does have several wks ongoing nasal allergy symptoms with clearish congestion, itch and sneezing, without fever, pain, ST, cough, swelling or wheezing.        Wt Readings from Last 3 Encounters:  09/05/23 197 lb 12.8 oz (89.7 kg)  03/08/23 215 lb (97.5 kg)  10/18/22 206 lb (93.4 kg)   BP Readings from Last 3 Encounters:  09/05/23 122/66  03/08/23 124/78  10/18/22 130/78         Past Medical History:  Diagnosis Date   Allergic rhinitis    Arthritis    arthritis in back   Asthma    B12 deficiency    Cancer (HCC) 2016   kidney    CKD (chronic kidney disease) 04/20/2020   stage 3 per OV note on 04/20/20 from Dr. 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   Hypercholesterolemia    Hypertension    Hypothyroidism    endocrinologist -  dr ellision   Iron deficiency anemia    Lumbar spondylosis    NAFLD (nonalcoholic fatty liver disease)    dx 2010--- last hepatic panel in epic dated 04-17-2017   Nocturia    OA (osteoarthritis of spine)    Lumbar   OSA on CPAP pulmologist-  dr shellia   sleep study 09-11-2004  very severe osa , AHI 119/hr,  setting 10   Peripheral neuropathy    bottom of feet   Renal calculus, right    Sleep apnea    uses CPAP   Type 2 diabetes mellitus (HCC)     endocrinoloigst-  dr kassie,  last A1c 5.9 on 02-20-219 in epic   Vitamin B 12 deficiency    Wears glasses    Past Surgical History:  Procedure Laterality Date   BUNIONECTOMY/  HAMMERTOE CORRECTION'S  09-07-2013   SCG   RIGHT FOOT 2ND, 3RD, 4TH, 5TH DIGITS   CARDIAC CATHETERIZATION  06-01-2009  dr verlin   no evidence CAD,  normal LVSF, elevated blood pressure w/ elevated end-diastolic pressure, ef 60-65%   CARDIOVASCULAR STRESS TEST  05-11-2009   dr verlin   abnormal lexiscan  nuclear study (no exercise) w/ mild ischemia in the distal anteroseptal wall and apex, this defect may be due to shifting breast attenuation   COLONOSCOPY  last 02/08/2014   COLONOSCOPY WITH SNARE POLYPECTOMY WITH ESOPHAGOGASTRODUODENOSCOPY  02/08/2014   with biopsy Dr. CHRISTELLA. Aneita   CYSTOSCOPY WITH RETROGRADE PYELOGRAM, URETEROSCOPY AND STENT PLACEMENT Right 06/07/2017   Procedure: CYSTOSCOPY WITH RETROGRADE PYELOGRAM, URETEROSCOPY, LITHOTRIPSY,  AND STENT PLACEMENT 1st STAGE;  Surgeon: alvaro Hummer, MD;  Location: [facility]  Service: Urology;  Laterality: Right;   CYSTOSCOPY WITH RETROGRADE PYELOGRAM, URETEROSCOPY AND  STENT PLACEMENT Right 06/21/2017   Procedure: CYSTOSCOPY WITH RETROGRADE PYELOGRAM, URETEROSCOPY AND STENT PLACEMENT 2ND STAGE  STENT EXCHANGE;  Surgeon: Alvaro Hummer, MD;  Location: [facility];  Service: Urology;  Laterality: Right;   CYSTOSCOPY WITH RETROGRADE PYELOGRAM, URETEROSCOPY AND STENT PLACEMENT Right 01/13/2021   Procedure: FIRST STAGE CYSTOSCOPY WITH RETROGRADE PYELOGRAM, URETEROSCOPY AND STENT PLACEMENT;  Surgeon: Alvaro Hummer, MD;  Location: [facility];  Service: Urology;  Laterality: Right;  90 MINS   CYSTOSCOPY WITH RETROGRADE PYELOGRAM, URETEROSCOPY AND STENT PLACEMENT Right 01/27/2021   Procedure: SECOND STAGE CYSTOSCOPY WITH RETROGRADE PYELOGRAM, URETEROSCOPY AND STENT EXCHANGE;  Surgeon: Alvaro Hummer, MD;  Location: [facility];  Service: Urology;  Laterality: Right;  90 MINS   FRACTURE SURGERY Right    Compound fracture foot   FRACTURE SURGERY Right 1963   Pt had surgery to repair broken arm.   HOLMIUM LASER APPLICATION Right 06/07/2017   Procedure: HOLMIUM LASER APPLICATION;  Surgeon: Alvaro Hummer, MD;  Location: [facility];  Service: Urology;  Laterality: Right;   HOLMIUM LASER APPLICATION Right 06/21/2017   Procedure: HOLMIUM LASER APPLICATION;  Surgeon: Alvaro Hummer, MD;  Location: [facility];  Service: Urology;  Laterality: Right;   HOLMIUM LASER APPLICATION Right 01/13/2021   Procedure: HOLMIUM LASER APPLICATION;  Surgeon: Alvaro Hummer, MD;  Location: [facility];  Service: Urology;  Laterality: Right;   HOLMIUM LASER APPLICATION Right 01/27/2021   Procedure: HOLMIUM LASER APPLICATION;  Surgeon: Alvaro Hummer, MD;  Location: [facility];  Service: Urology;  Laterality: Right;   KNEE ARTHROSCOPY Left 1993   POLYPECTOMY     ROBOTIC ASSITED PARTIAL NEPHRECTOMY Right 05/19/2014   Procedure: ROBOTIC ASSITED PARTIAL NEPHRECTOMY;  Surgeon: Hummer Alvaro, MD;  Location: [facility];  Service: Urology;  Laterality: Right;    reports that she quit smoking about 48 years ago. Her smoking use included cigarettes. She started smoking about 50 years ago. She has a 0.6 pack-year smoking history. She has never used smokeless tobacco. She reports that she does not drink alcohol and does not use drugs. family history includes Allergies in her sister; Asthma in her sister; Clotting disorder in her grandchild; Colon cancer (age of onset: 25) in her mother; Colon polyps in her mother; Diabetes in her mother; Heart attack in her father; Heart disease in her father and paternal grandmother; Kidney disease in her mother. Allergies  Allergen Reactions   Erythromycin Nausea And Vomiting   Current Outpatient Medications on File Prior to Visit  Medication Sig Dispense Refill    albuterol  (PROAIR  HFA) 108 (90 Base) MCG/ACT inhaler Inhale 2 puffs into the lungs every 4 (four) hours as needed for wheezing or shortness of breath (cough). 3 each 3   cholecalciferol (VITAMIN D ) 1000 units tablet Take 1,000 Units by mouth daily.     citalopram  (CELEXA ) 20 MG tablet TAKE 1 TABLET BY MOUTH EVERY DAY IN THE MORNING 90 tablet 2   collagenase (SANTYL) ointment Apply 1 application topically daily.     eszopiclone  (LUNESTA ) 2 MG TABS tablet TAKE 1 TABLET BY MOUTH IMMEDIATELY BEFORE BEDTIME AS NEEDED FOR SLEEP 90 tablet 1   FARXIGA  10 MG TABS tablet TAKE 1 TABLET BY MOUTH DAILY BEFORE BREAKFAST. 90 tablet 3   fluticasone -salmeterol (ADVAIR) 100-50 MCG/ACT AEPB Inhale 1 puff into the lungs 2 (two) times daily. (Patient taking differently: Inhale 1 puff into the lungs daily.) 3 each 3   gabapentin  (NEURONTIN ) 300 MG capsule TAKE 1 CAPSULE  BY MOUTH EVERYDAY AT BEDTIME 90 capsule 1   levothyroxine  (SYNTHROID ) 112 MCG tablet Take 1 tablet (112 mcg total) by mouth daily. 90 tablet 3   losartan  (COZAAR ) 100 MG tablet TAKE 1 TABLET BY MOUTH EVERY DAY 90 tablet 3   metFORMIN  (GLUCOPHAGE -XR) 500 MG 24 hr tablet TAKE 2 TABLETS BY MOUTH TWICE A DAY 360 tablet 3   metoprolol  succinate (TOPROL -XL) 25 MG 24 hr tablet TAKE 1/2 TABLET BY MOUTH DAILY 45 tablet 1   MOUNJARO 2.5 MG/0.5ML Pen SMARTSIG:2.5 Milligram(s) SUB-Q Once a Week     ondansetron  (ZOFRAN -ODT) 4 MG disintegrating tablet Take 1 tablet (4 mg total) by mouth every 8 (eight) hours as needed for nausea or vomiting. 20 tablet 0   Oxycodone  HCl 10 MG TABS Take 1 tablet (10 mg total) by mouth every 6 (six) hours as needed (breakthrogh pain post-operatively). 10 tablet 0   RESTASIS 0.05 % ophthalmic emulsion Place 1 drop into both eyes 2 (two) times daily.     Semaglutide ,0.25 or 0.5MG /DOS, (OZEMPIC , 0.25 OR 0.5 MG/DOSE,) 2 MG/3ML SOPN Take 0.5 mg subcutaneous once weekly E11.9 3 mL 11   zolpidem  (AMBIEN  CR) 12.5 MG CR tablet TAKE 1 TABLET (12.5  MG TOTAL) BY MOUTH AT BEDTIME AS NEEDED. FOR SLEEP 90 tablet 1   No current facility-administered medications on file prior to visit.        ROS:  All others reviewed and negative.  Objective        PE:  BP 122/66   Pulse 85   Temp 98 F (36.7 C)   Ht 5' 8 (1.727 m)   Wt 197 lb 12.8 oz (89.7 kg)   SpO2 98%   BMI 30.08 kg/m                 Constitutional: Pt appears in NAD               HENT: Head: NCAT.                Right Ear: External ear normal.                 Left Ear: External ear normal.                Eyes: . Pupils are equal, round, and reactive to light. Conjunctivae and EOM are normal               Nose: without d/c or deformity               Neck: Neck supple. Gross normal ROM               Cardiovascular: Normal rate and regular rhythm.                 Pulmonary/Chest: Effort normal and breath sounds without rales or wheezing.                Abd:  Soft, NT, ND, + BS, no organomegaly               Neurological: Pt is alert. At baseline orientation, motor grossly intact               Skin: Skin is warm. No rashes, no other new lesions, LE edema - none               Psychiatric: Pt behavior is normal without agitation   Micro: none  Cardiac tracings I have personally interpreted today:  none  Pertinent Radiological findings (summarize): none   Lab Results  Component Value Date   WBC 9.1 09/05/2023   HGB 14.2 09/05/2023   HCT 41.4 09/05/2023   PLT 235.0 09/05/2023   GLUCOSE 104 (H) 09/05/2023   CHOL 131 09/05/2023   TRIG 141.0 09/05/2023   HDL 53.50 09/05/2023   LDLDIRECT 58.0 04/20/2021   LDLCALC 49 09/05/2023   ALT 14 09/05/2023   AST 18 09/05/2023   NA 137 09/05/2023   K 4.1 09/05/2023   CL 100 09/05/2023   CREATININE 1.03 09/05/2023   BUN 23 09/05/2023   CO2 27 09/05/2023   TSH 0.44 09/05/2023   INR 1.1 ratio (H) 05/23/2009   HGBA1C 5.2 09/05/2023   MICROALBUR 1.4 09/05/2023   Assessment/Plan:  KHARISMA GLASNER is a 68 y.o. White or  Caucasian [1] female with  has a past medical history of Allergic rhinitis, Arthritis, Asthma, B12 deficiency, Cancer (HCC) (2016), CKD (chronic kidney disease) (04/20/2020), Depression, Grade I internal hemorrhoids, Hiatal hernia, History of colon polyps, History of kidney stones, History of renal cell carcinoma (urologist-  dr alvaro), Hypercholesterolemia, Hypertension, Hypothyroidism, Iron deficiency anemia, Lumbar spondylosis, NAFLD (nonalcoholic fatty liver disease), Nocturia, OA (osteoarthritis of spine), OSA on CPAP (pulmologist-  dr shellia), Peripheral neuropathy, Renal calculus, right, Sleep apnea, Type 2 diabetes mellitus (HCC), Vitamin B 12 deficiency, and Wears glasses.  Vitamin D  deficiency Last vitamin D  Lab Results  Component Value Date   VD25OH 30.29 09/05/2023   Low, to start oral replacement   Stage 3 chronic kidney disease Lab Results  Component Value Date   CREATININE 1.03 09/05/2023   Stable overall, cont to avoid nephrotoxins   HYPERCHOLESTEROLEMIA Lab Results  Component Value Date   LDLCALC 49 09/05/2023   Stable, pt to continue current statin lipitor 40 mg qd   Essential hypertension BP Readings from Last 3 Encounters:  09/05/23 122/66  03/08/23 124/78  10/18/22 130/78   Stable, pt to continue medical treatment losartan  100 mg every day, toprol  xl 12.5 qd   Allergic rhinitis Mild to mod, for otc zyrtec 10 every day prn, also nasacort  asd daily,  to f/u any worsening symptoms or concerns Followup: Return in about 6 months (around 03/07/2024).  Lynwood Rush, MD 09/06/2023 8:40 PM [facility]

## 2023-09-06 ENCOUNTER — Encounter: Payer: Self-pay | Admitting: Internal Medicine

## 2023-09-06 ENCOUNTER — Ambulatory Visit: Payer: Self-pay | Admitting: Internal Medicine

## 2023-09-06 NOTE — Assessment & Plan Note (Signed)
 Mild to mod, for otc zyrtec 10 every day prn, also nasacort  asd daily,  to f/u any worsening symptoms or concerns

## 2023-09-06 NOTE — Assessment & Plan Note (Signed)
 BP Readings from Last 3 Encounters:  09/05/23 122/66  03/08/23 124/78  10/18/22 130/78   Stable, pt to continue medical treatment losartan  100 mg every day, toprol  xl 12.5 qd

## 2023-09-06 NOTE — Assessment & Plan Note (Signed)
 Lab Results  Component Value Date   CREATININE 1.03 09/05/2023   Stable overall, cont to avoid nephrotoxins

## 2023-09-06 NOTE — Assessment & Plan Note (Signed)
 Last vitamin D  Lab Results  Component Value Date   VD25OH 30.29 09/05/2023   Low, to start oral replacement

## 2023-09-06 NOTE — Assessment & Plan Note (Addendum)
 Lab Results  Component Value Date   LDLCALC 49 09/05/2023   Stable, pt to continue current statin lipitor 40 mg qd

## 2023-09-09 ENCOUNTER — Other Ambulatory Visit: Payer: Self-pay | Admitting: Internal Medicine

## 2023-09-11 ENCOUNTER — Ambulatory Visit (INDEPENDENT_AMBULATORY_CARE_PROVIDER_SITE_OTHER): Admitting: Podiatry

## 2023-09-11 DIAGNOSIS — L97512 Non-pressure chronic ulcer of other part of right foot with fat layer exposed: Secondary | ICD-10-CM | POA: Diagnosis not present

## 2023-09-11 NOTE — Progress Notes (Signed)
 Patient presents follow-up ulceration great toe right.  Has only noticed some clear drainage from it.  No pain.  No signs of infection.  Is wearing her surgical shoe today.  Physical Exam:  Patient alert and oriented x 3.  No complaints of nausea, vomiting, fever, or chills  Vascular: DP pulses 2/4 bilateral. PT pulses 1/4 lateral.  Mild to moderate edema hallux right. Capillary fill time immediate.  Dermatologic: Full-thickness ulceration plantar medial aspect hallux right with penetration into subcutaneous tissue and good base of granulation tissue.  Moderate clear drainage.  No surrounding erythema.  No tenderness.  Measures 8 mm wide x 5 mm long x 4 deep.    Neurologic:   Musculoskeletal:     Diagnoses: 1.  Full-thickness ulcer penetrating the subcutaneous tissue Wagner grade 2.-Improved.  Plan: -Continue current wound care soaking foot once daily warm Epsom salt water  for 15 minutes, apply Santyl ointment light dressing.  Continue wearing surgical shoe for all weightbearing -Sharp debridement full-thickness ulceration into the subcutaneous tissue hallux right.  Sharply debrided and devitalized tissue to a good bleeding base.  Applied antibiotic and a light dressing.  Return 2 weeks f/u ulcer

## 2023-09-25 ENCOUNTER — Ambulatory Visit (INDEPENDENT_AMBULATORY_CARE_PROVIDER_SITE_OTHER): Admitting: Podiatry

## 2023-09-25 ENCOUNTER — Encounter: Payer: Self-pay | Admitting: Podiatry

## 2023-09-25 DIAGNOSIS — L97512 Non-pressure chronic ulcer of other part of right foot with fat layer exposed: Secondary | ICD-10-CM

## 2023-09-25 NOTE — Progress Notes (Signed)
 Today follow-up ulceration right foot.  Swelling and some clear drainage from the ulcer.  Has been doing wound care as instructed. she is wearing a surgical shoe today.  Physical Exam:  Patient alert and oriented x 3.  No complaints of nausea, vomiting, fever, or chills  Vascular: DP pulses 2/4 bilateral. PT pulses 2/4 lateral.  Mild edema hallux right. Capillary fill time immediate bilaterally.  Dermatologic: Both this ulceration penetrating the subcutaneous tissue plantar medial aspect hallux right at IPJ.  Ulcer is a good base of granulation tissue with no signs of infection.  Moderate clear drainage present.  No undermining.. Measures 7mm wide x 6 mm long x 3mm deep.   Neurologic:   Musculoskeletal:   Diagnoses: 1.  Full-thickness ulceration penetrating subcutaneous tissue Wagner grade 2 hallux right.  Plan: -Continue wound care soaking once daily warm Epsom salt water  15 minutes, apply Santyl ointment, and apply dressing.  -Sharp debridement full-thickness ulceration into the subcutaneous tissue hallux right.  Debrided and devitalized tissue down to a good bleeding base.  Applied antibiotic cream and a light dressing.   Return 2 weeks f/u ulcer hallux right

## 2023-09-27 ENCOUNTER — Other Ambulatory Visit: Payer: Self-pay | Admitting: Internal Medicine

## 2023-10-03 ENCOUNTER — Ambulatory Visit

## 2023-10-03 VITALS — Ht 68.0 in | Wt 197.0 lb

## 2023-10-03 DIAGNOSIS — Z1231 Encounter for screening mammogram for malignant neoplasm of breast: Secondary | ICD-10-CM

## 2023-10-03 DIAGNOSIS — Z0000 Encounter for general adult medical examination without abnormal findings: Secondary | ICD-10-CM | POA: Diagnosis not present

## 2023-10-03 NOTE — Progress Notes (Signed)
 Subjective:   Savannah [name] is a 68 y.o. who presents for a Medicare Wellness preventive visit.  As a reminder, Annual Wellness Visits don't include a physical exam, and some assessments may be limited, especially if this visit is performed virtually. We may recommend an in-person follow-up visit with your provider if needed.  Visit Complete: Virtual I connected with  [name] on 10/03/23 by a audio enabled telemedicine application and verified that I am speaking with the correct person using two identifiers.  Patient Location: Home  Provider Location: Office/Clinic  I discussed the limitations of evaluation and management by telemedicine. The patient expressed understanding and agreed to proceed.  Vital Signs: Because this visit was a virtual/telehealth visit, some criteria may be missing or patient reported. Any vitals not documented were not able to be obtained and vitals that have been documented are patient reported.  VideoDeclined- This patient declined Librarian, academic. Therefore the visit was completed with audio only.  Persons Participating in Visit: Patient.  AWV Questionnaire: Yes: Patient Medicare AWV questionnaire was completed by the patient on 10/02/2023; I have confirmed that all information answered by patient is correct and no changes since this date.  Cardiac Risk Factors include: advanced age (>35men, >34 women);hypertension     Objective:    Today's Vitals   10/03/23 1356  Weight: 197 lb (89.4 kg)  Height: 5' 8 (1.727 m)   Body mass index is 29.95 kg/m.     10/03/2023    2:01 PM 04/23/2022    3:31 PM 04/20/2021   10:33 AM 01/13/2021    6:58 AM 01/10/2021    1:07 PM 03/07/2018    9:20 AM 03/06/2018    8:38 PM  Advanced Directives  Does Patient Have a Medical Advance Directive? No No No No No No  No   Does patient want to make changes to medical advance directive?      No - Patient declined  Yes (ED - Information  included in AVS)   Would patient like information on creating a medical advance directive?  No - Patient declined No - Patient declined No - Patient declined  No - Patient declined       Data saved with a previous flowsheet row definition    Current Medications (verified) Outpatient Encounter Medications as of 10/03/2023  Medication Sig   albuterol  (PROAIR  HFA) 108 (90 Base) MCG/ACT inhaler Inhale 2 puffs into the lungs every 4 (four) hours as needed for wheezing or shortness of breath (cough).   atorvastatin  (LIPITOR) 40 MG tablet Take 1 tablet (40 mg total) by mouth daily.   cholecalciferol (VITAMIN D ) 1000 units tablet Take 1,000 Units by mouth daily.   citalopram  (CELEXA ) 20 MG tablet TAKE 1 TABLET BY MOUTH EVERY DAY IN THE MORNING   collagenase (SANTYL) ointment Apply 1 application topically daily.   eszopiclone  (LUNESTA ) 2 MG TABS tablet TAKE 1 TABLET BY MOUTH IMMEDIATELY BEFORE BEDTIME AS NEEDED FOR SLEEP   FARXIGA  10 MG TABS tablet TAKE 1 TABLET BY MOUTH DAILY BEFORE BREAKFAST.   fluticasone -salmeterol (ADVAIR) 100-50 MCG/ACT AEPB Inhale 1 puff into the lungs 2 (two) times daily. (Patient taking differently: Inhale 1 puff into the lungs daily.)   gabapentin  (NEURONTIN ) 300 MG capsule TAKE 1 CAPSULE BY MOUTH EVERYDAY AT BEDTIME   levothyroxine  (SYNTHROID ) 112 MCG tablet TAKE 1 TABLET BY MOUTH EVERY DAY   losartan  (COZAAR ) 100 MG tablet TAKE 1 TABLET BY MOUTH EVERY DAY   metFORMIN  (GLUCOPHAGE -XR)  500 MG 24 hr tablet TAKE 2 TABLETS BY MOUTH TWICE A DAY   metoprolol  succinate (TOPROL -XL) 25 MG 24 hr tablet TAKE 1/2 TABLET BY MOUTH DAILY   MOUNJARO 2.5 MG/0.5ML Pen SMARTSIG:2.5 Milligram(s) SUB-Q Once a Week   ondansetron  (ZOFRAN -ODT) 4 MG disintegrating tablet Take 1 tablet (4 mg total) by mouth every 8 (eight) hours as needed for nausea or vomiting.   Oxycodone  HCl 10 MG TABS Take 1 tablet (10 mg total) by mouth every 6 (six) hours as needed (breakthrogh pain post-operatively).   RESTASIS  0.05 % ophthalmic emulsion Place 1 drop into both eyes 2 (two) times daily.   SUMAtriptan  (IMITREX ) 100 MG tablet Take 1 tablet (100 mg total) by mouth every 2 (two) hours as needed for migraine or headache. May repeat in 2 hours if headache persists or recurs.   zolpidem  (AMBIEN  CR) 12.5 MG CR tablet TAKE 1 TABLET (12.5 MG TOTAL) BY MOUTH AT BEDTIME AS NEEDED. FOR SLEEP   Semaglutide ,0.25 or 0.5MG /DOS, (OZEMPIC , 0.25 OR 0.5 MG/DOSE,) 2 MG/3ML SOPN Take 0.5 mg subcutaneous once weekly E11.9   No facility-administered encounter medications on file as of 10/03/2023.    Allergies (verified) Erythromycin   History: Past Medical History:  Diagnosis Date   Allergic rhinitis    Arthritis    arthritis in back   Asthma    B12 deficiency    Cancer (HCC) 2016   kidney    CKD (chronic kidney disease) 04/20/2020   stage 3 per OV note on 04/20/20 from Dr. 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   Hypercholesterolemia    Hypertension    Hypothyroidism    endocrinologist -  dr ellision   Iron deficiency anemia    Lumbar spondylosis    NAFLD (nonalcoholic fatty liver disease)    dx 2010--- last hepatic panel in epic dated 04-17-2017   Nocturia    OA (osteoarthritis of spine)    Lumbar   OSA on CPAP pulmologist-  dr shellia   sleep study 09-11-2004  very severe osa , AHI 119/hr,  setting 10   Peripheral neuropathy    bottom of feet   Renal calculus, right    Sleep apnea    uses CPAP   Type 2 diabetes mellitus (HCC)    endocrinoloigst-  dr kassie,  last A1c 5.9 on 02-20-219 in epic   Vitamin B 12 deficiency    Wears glasses    Past Surgical History:  Procedure Laterality Date   BUNIONECTOMY/  HAMMERTOE  CORRECTION'S  09-07-2013   SCG   RIGHT FOOT 2ND, 3RD, 4TH, 5TH DIGITS   CARDIAC CATHETERIZATION  06-01-2009  dr verlin   no evidence CAD,  normal LVSF, elevated blood pressure w/ elevated end-diastolic pressure, ef 60-65%   CARDIOVASCULAR STRESS TEST  05-11-2009   dr verlin   abnormal lexiscan  nuclear study (no exercise) w/ mild ischemia in the distal anteroseptal wall and apex, this defect may be due to shifting breast attenuation   COLONOSCOPY  last 02/08/2014   COLONOSCOPY WITH SNARE POLYPECTOMY WITH ESOPHAGOGASTRODUODENOSCOPY  02/08/2014   with biopsy Dr. CHRISTELLA. Aneita   CYSTOSCOPY WITH RETROGRADE PYELOGRAM, URETEROSCOPY AND STENT  PLACEMENT Right 06/07/2017   Procedure: CYSTOSCOPY WITH RETROGRADE PYELOGRAM, URETEROSCOPY, LITHOTRIPSY,  AND STENT PLACEMENT 1st STAGE;  Surgeon: Alvaro Hummer, MD;  Location[facility];  Service: Urology;  Laterality: Right;   CYSTOSCOPY WITH RETROGRADE PYELOGRAM, URETEROSCOPY AND STENT PLACEMENT Right 06/21/2017   Procedure: CYSTOSCOPY WITH RETROGRADE PYELOGRAM, URETEROSCOPY AND STENT PLACEMENT 2ND STAGE  STENT EXCHANGE;  Surgeon: Alvaro Hummer, MD;  Location: [facility];  Service: Urology;  Laterality: Right;   CYSTOSCOPY WITH RETROGRADE PYELOGRAM, URETEROSCOPY AND STENT PLACEMENT Right 01/13/2021   Procedure: FIRST STAGE CYSTOSCOPY WITH RETROGRADE PYELOGRAM, URETEROSCOPY AND STENT PLACEMENT;  Surgeon: Alvaro Hummer, MD;  Location: [facility];  Service: Urology;  Laterality: Right;  90 MINS   CYSTOSCOPY WITH RETROGRADE PYELOGRAM, URETEROSCOPY AND STENT PLACEMENT Right 01/27/2021   Procedure: SECOND STAGE CYSTOSCOPY WITH RETROGRADE PYELOGRAM, URETEROSCOPY AND STENT EXCHANGE;  Surgeon: Alvaro Hummer, MD;  Location: [facility];  Service: Urology;  Laterality: Right;  90 MINS   FRACTURE SURGERY Right    Compound fracture foot   FRACTURE SURGERY Right 1963   Pt had surgery to repair broken arm.   HOLMIUM LASER  APPLICATION Right 06/07/2017   Procedure: HOLMIUM LASER APPLICATION;  Surgeon: Alvaro Hummer, MD;  [facility];  Service: Urology;  Laterality: Right;   HOLMIUM LASER APPLICATION Right 06/21/2017   Procedure: HOLMIUM LASER APPLICATION;  Surgeon: Alvaro Hummer, MD;  Location: [facility];  Service: Urology;  Laterality: Right;   HOLMIUM LASER APPLICATION Right 01/13/2021   Procedure: HOLMIUM LASER APPLICATION;  Surgeon: Alvaro Hummer, MD;  Location: [facility];  Service: Urology;  Laterality: Right;   HOLMIUM LASER APPLICATION Right 01/27/2021   Procedure: HOLMIUM LASER APPLICATION;  Surgeon: Alvaro Hummer, MD;  Location: [facility];  Service: Urology;  Laterality: Right;   KNEE ARTHROSCOPY Left 1993   POLYPECTOMY     ROBOTIC ASSITED PARTIAL NEPHRECTOMY Right 05/19/2014   Procedure: ROBOTIC ASSITED PARTIAL NEPHRECTOMY;  Surgeon: Hummer Alvaro, MD;  Location: [facility];  Service: Urology;  Laterality: Right;   Family History  Problem Relation Age of Onset   Colon cancer Mother 77   Diabetes Mother    Kidney disease Mother    Colon polyps Mother    Heart attack Father    Heart disease Father    Allergies Sister    Asthma Sister    Clotting disorder Grandchild    Heart disease Paternal Grandmother    Esophageal cancer Neg Hx    Rectal cancer Neg Hx    Stomach cancer Neg Hx    Social History   Socioeconomic History   Marital status: Married    Spouse name: Ubaldo   Number of children: 2   Years of education: Not on file   Highest education level: Associate degree: academic program  Occupational History   Occupation: Dentist: UNEMPLOYED   Occupation: RETIRED  Tobacco Use   Smoking status: Former    Current packs/day: 0.00    Average packs/day: 0.3 packs/day for 2.0 years (0.6 ttl pk-yrs)    Types: Cigarettes    Start date: 02/26/1973    Quit date: 02/27/1975    Years since quitting: 48.6   Smokeless tobacco:  Never  Vaping Use   Vaping status: Never Used  Substance and Sexual Activity   Alcohol use: No   Drug use: No   Sexual activity: Not on file  Other Topics Concern   Not on file  Social  History Narrative   Pt has completed training as a Energy manager      Lives with husband and mother and 2 grandchildren/2025   Social Drivers of Corporate investment banker Strain: Low Risk  (10/02/2023)   Overall Financial Resource Strain (CARDIA)    Difficulty of Paying Living Expenses: Not very hard  Food Insecurity: Food Insecurity Present (10/02/2023)   Hunger Vital Sign    Worried About Running Out of Food in the Last Year: Patient declined    Ran Out of Food in the Last Year: Sometimes true  Transportation Needs: No Transportation Needs (10/02/2023)   PRAPARE - Administrator, Civil Service (Medical): No    Lack of Transportation (Non-Medical): No  Physical Activity: Inactive (10/02/2023)   Exercise Vital Sign    Days of Exercise per Week: 0 days    Minutes of Exercise per Session: Not on file  Stress: Stress Concern Present (10/02/2023)   [employer] of Occupational Health - Occupational Stress Questionnaire    Feeling of Stress: To some extent  Social Connections: Moderately Integrated (10/02/2023)   Social Connection and Isolation Panel    Frequency of Communication with Friends and Family: More than three times a week    Frequency of Social Gatherings with Friends and Family: More than three times a week    Attends Religious Services: 1 to 4 times per year    Active Member of [employer] or Organizations: No    Attends Engineer, structural: Not on file    Marital Status: Married    Tobacco Counseling Counseling given: Not Answered    Clinical Intake:  Pre-visit preparation completed: Yes  Pain : No/denies pain     BMI - recorded: 29.95 Nutritional Status: BMI 25 -29 Overweight Nutritional Risks: None Diabetes: Yes CBG done?: No Did pt. bring in CBG  monitor from home?: No  Lab Results  Component Value Date   HGBA1C 5.2 09/05/2023   HGBA1C 7.0 (H) 03/08/2023   HGBA1C 5.4 10/18/2022     How often do you need to have someone help you when you read instructions, pamphlets, or other written materials from your doctor or pharmacy?: 1 - Never  Interpreter Needed?: No  Information entered by :: Kirin Pastorino, RMA   Activities of Daily Living     10/02/2023    4:21 PM  In your present state of health, do you have any difficulty performing the following activities:  Hearing? 0  Vision? 0  Difficulty concentrating or making decisions? 0  Walking or climbing stairs? 1  Dressing or bathing? 0  Doing errands, shopping? 0  Preparing Food and eating ? N  Using the Toilet? N  In the past six months, have you accidently leaked urine? Y  Do you have problems with loss of bowel control? N  Managing your Medications? N  Managing your Finances? N  Housekeeping or managing your Housekeeping? Y    Patient Care Team: Norleen Lynwood ORN, MD as PCP - General (Internal Medicine) Joya Ade, DPM as Consulting Physician (Podiatry) Bonner Ade, MD (Physical Medicine and Rehabilitation) Chales Idol, MD as Attending Physician (Urology) Estelle Service, MD (Obstetrics and Gynecology) Clance, Francis HERO, MD (Pulmonary Disease) Himmelrich, Camie RAMAN, RD (Inactive) as Dietitian Raelyn Betters as Consulting Physician (Optometry)  I have updated your Care Teams any recent Medical Services you may have received from other providers in the past year.     Assessment:   This is a routine wellness examination  for [place].  Hearing/Vision screen Hearing Screening - Comments:: Denies hearing difficulties   Vision Screening - Comments:: Wears eyeglasses/[facility]   Goals Addressed   None    Depression Screen     10/03/2023    2:04 PM 03/08/2023    1:13 PM 10/18/2022   11:30 AM 04/23/2022    3:32 PM 04/19/2022   11:16 AM 10/20/2021   10:12  AM 04/20/2021   11:25 AM  PHQ 2/9 Scores  PHQ - 2 Score 0 0 0 0 0 0 0  PHQ- 9 Score 1   0 0 0     Fall Risk     10/02/2023    4:21 PM 03/08/2023    1:13 PM 10/18/2022   11:30 AM 04/23/2022    3:32 PM 04/22/2022    2:25 PM  Fall Risk   Falls in the past year? 1 0 1 0 0  Number falls in past yr: 1 0 0 0   Injury with Fall? 1 0 0 0 0  Risk for fall due to :  No Fall Risks History of fall(s) No Fall Risks   Follow up Falls evaluation completed;Falls prevention discussed Falls evaluation completed Falls evaluation completed Falls prevention discussed     MEDICARE RISK AT HOME:  Medicare Risk at Home Any stairs in or around the home?: (Patient-Rptd) Yes If so, are there any without handrails?: (Patient-Rptd) No Home free of loose throw rugs in walkways, pet beds, electrical cords, etc?: (Patient-Rptd) Yes Adequate lighting in your home to reduce risk of falls?: (Patient-Rptd) Yes Life alert?: (Patient-Rptd) No Use of a cane, walker or w/c?: (Patient-Rptd) Yes Grab bars in the bathroom?: (Patient-Rptd) Yes Shower chair or bench in shower?: (Patient-Rptd) Yes Elevated toilet seat or a handicapped toilet?: (Patient-Rptd) Yes  TIMED UP AND GO:  Was the test performed?  No  Cognitive Function: Declined/Normal: No cognitive concerns noted by patient or family. Patient alert, oriented, able to answer questions appropriately and recall recent events. No signs of memory loss or confusion.        04/23/2022    3:33 PM 04/20/2021   10:37 AM  6CIT Screen  What Year? 0 points 0 points  What month? 0 points 0 points  What time? 0 points 0 points  Count back from 20 0 points 0 points  Months in reverse 0 points 0 points  Repeat phrase 0 points 0 points  Total Score 0 points 0 points    Immunizations Immunization History  Administered Date(s) Administered   Fluad Quad(high Dose 65+) 12/09/2019, 12/31/2020, 01/24/2022   Influenza Inj Mdck Quad Pf 12/20/2016   Influenza Split 12/29/2010,  12/28/2011   Influenza Whole 12/16/2007, 12/06/2008, 12/26/2009   Influenza,inj,Quad PF,6+ Mos 04/15/2015, 01/06/2016, 11/07/2017, 02/04/2019   Influenza-Unspecified 11/26/2013, 12/27/2016   PFIZER(Purple Top)SARS-COV-2 Vaccination 05/22/2019, 06/15/2019, 01/01/2020   Pneumococcal Conjugate-13 10/16/2019   Pneumococcal Polysaccharide-23 05/09/2010, 04/17/2017   Td 11/16/2004   Tdap 04/17/2016   Zoster, Live 04/15/2015    Screening Tests Health Maintenance  Topic Date Due   Zoster Vaccines- Shingrix (1 of 2) 08[birth year]15/1973   MAMMOGRAM  10/19/2020   Medicare Annual Wellness (AWV)  04/24/2023   INFLUENZA VACCINE  09/27/2023   FOOT EXAM  03/07/2024   HEMOGLOBIN A1C  03/07/2024   OPHTHALMOLOGY EXAM  06/30/2024   Diabetic kidney evaluation - eGFR measurement  09/04/2024   Diabetic kidney evaluation - Urine ACR  09/04/2024   Pneumococcal Vaccine: 50+ Years (4 of 4 - PCV20 or PCV21)  10/15/2024   Colonoscopy  08/22/2025   DTaP/Tdap/Td (3 - Td or Tdap) 04/17/2026   DEXA SCAN  Completed   Hepatitis C Screening  Completed   Hepatitis B Vaccines  Aged Out   HPV VACCINES  Aged Out   Meningococcal B Vaccine  Aged Out   COVID-19 Vaccine  Discontinued    Health Maintenance  Health Maintenance Due  Topic Date Due   Zoster Vaccines- Shingrix (1 of 2) 08/15/1973   MAMMOGRAM  10/19/2020   Medicare Annual Wellness (AWV)  04/24/2023   INFLUENZA VACCINE  09/27/2023   Health Maintenance Items Addressed: Mammogram ordered, See Nurse Notes at the end of this note  Additional Screening:  Vision Screening: Recommended annual ophthalmology exams for early detection of glaucoma and other disorders of the eye. Would you like a referral to an eye doctor? No    Dental Screening: Recommended annual dental exams for proper oral hygiene  Community Resource Referral / Chronic Care Management: CRR required this visit?  No   CCM required this visit?  No   Plan:    I have personally reviewed and  noted the following in the patient's chart:   Medical and social history Use of alcohol, tobacco or illicit drugs  Current medications and supplements including opioid prescriptions. Patient is not currently taking opioid prescriptions. Functional ability and status Nutritional status Physical activity Advanced directives List of other physicians Hospitalizations, surgeries, and ER visits in previous 12 months Vitals Screenings to include cognitive, depression, and falls Referrals and appointments  In addition, I have reviewed and discussed with patient certain preventive protocols, quality metrics, and best practice recommendations. A written personalized care plan for preventive services as well as general preventive health recommendations were provided to patient.   Quintella Mura L Callaghan Laverdure, CMA   10/03/2023   After Visit Summary: (MyChart) Due to this being a telephonic visit, the after visit summary with patients personalized plan was offered to patient via MyChart   Notes: Patient is due for a Shingrix vaccine.  She is due for a mammogram and order has been placed today.  Patient stated that she has had a recent eye exam.  I have sent a request for records out today.  She had no other concerns to address today.

## 2023-10-03 NOTE — Patient Instructions (Signed)
 Ms. Rayman , Thank you for taking time out of your busy schedule to complete your Annual Wellness Visit with me. I enjoyed our conversation and look forward to speaking with you again next year. I, as well as your care team,  appreciate your ongoing commitment to your health goals. Please review the following plan we discussed and let me know if I can assist you in the future. Your Game plan/ To Do List    Referrals: If you haven't heard from the office you've been referred to, please reach out to them at the phone provided.  You have an order for:  [x]   3D Mammogram   Please call for appointment:  [facility] [address] [place], [place] [address] [phone]  Make sure to wear two-piece clothing.  No lotions, powders, or deodorants the day of the appointment. Make sure to bring picture ID and insurance card.  Bring list of medications you are currently taking including any supplements.    Follow up Visits: We will see or speak with you next year for your Next Medicare AWV with our clinical staff Have you seen your provider in the last 6 months (3 months if uncontrolled diabetes)? Yes  Clinician Recommendations:  Aim for 30 minutes of exercise or brisk walking, 6-8 glasses of water , and 5 servings of fruits and vegetables each day. You are due for a Shingles vaccine and can get that done at your local pharmacy.       This is a list of the screenings recommended for you:  Health Maintenance  Topic Date Due   Zoster (Shingles) Vaccine (1 of 2) 08/15/1973   Mammogram  10/19/2020   Medicare Annual Wellness Visit  04/24/2023   Flu Shot  09/27/2023   Complete foot exam   03/07/2024   Hemoglobin A1C  03/07/2024   Eye exam for diabetics  06/30/2024   Yearly kidney function blood test for diabetes  09/04/2024   Yearly kidney health urinalysis for diabetes  09/04/2024   Pneumococcal Vaccine for age over 21 (4 of 4 - PCV20 or PCV21) 10/15/2024   Colon Cancer  Screening  08/22/2025   DTaP/Tdap/Td vaccine (3 - Td or Tdap) 04/17/2026   DEXA scan (bone density measurement)  Completed   Hepatitis C Screening  Completed   Hepatitis B Vaccine  Aged Out   HPV Vaccine  Aged Out   Meningitis B Vaccine  Aged Out   COVID-19 Vaccine  Discontinued    Advanced directives: (Declined) Advance directive discussed with you today. Even though you declined this today, please call our office should you change your mind, and we can give you the proper paperwork for you to fill out. Advance Care Planning is important because it:  [x]  Makes sure you receive the medical care that is consistent with your values, goals, and preferences  [x]  It provides guidance to your family and loved ones and reduces their decisional burden about whether or not they are making the right decisions based on your wishes.  Follow the link provided in your after visit summary or read over the paperwork we have mailed to you to help you started getting your Advance Directives in place. If you need assistance in completing these, please reach out to us  so that we can help you!  See attachments for Preventive Care and Fall Prevention Tips.

## 2023-10-07 ENCOUNTER — Other Ambulatory Visit: Payer: Self-pay | Admitting: Internal Medicine

## 2023-10-09 ENCOUNTER — Ambulatory Visit: Admitting: Podiatry

## 2023-10-09 ENCOUNTER — Encounter: Payer: Self-pay | Admitting: Podiatry

## 2023-10-09 DIAGNOSIS — L97512 Non-pressure chronic ulcer of other part of right foot with fat layer exposed: Secondary | ICD-10-CM | POA: Diagnosis not present

## 2023-10-09 NOTE — Progress Notes (Signed)
 Patient presents follow-up ulceration hallux right.  Still doing wound care with soaks and using the Santyl ointment.  Tissue to using the surgical shoe daily.  No fever chills or nausea or vomiting.  Physical Exam:  Patient alert and oriented x 3.  No complaints of nausea, vomiting, fever, or chills  Vascular: DP pulses 2/4 bilateral. PT pulses 2/4 lateral. Mild edema. Capillary fill time immediate.  Dermatologic: Full-thickness ulceration penetrating into the subcutaneous tissue on the plantar medial aspect of the hallux right.  Thick base of granulation tissue.  Moderate clear drainage.  No signs of infection.  No odors.. Measures 3 mm wide x 10 mm long x 3 mm deep  Neurologic:   Musculoskeletal: Hallux abductovalgus deformity right hammertoe deformity second toe right   Diagnoses: 1.  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  Plan: -Continue current wound care soaking once a day and 15 minutes warm lukewarm Epsom salt water , apply the Santyl ointment, and a light dressing. -Sharp debridement full-thickness ulceration hallux right.  Sharp debridement of devitalized tissue into the subcutaneous level to a good bleeding base.  Applied antibiotic ointment and light dressing   Return 2 weeks f/u ulcer 1 right

## 2023-10-10 DIAGNOSIS — M25562 Pain in left knee: Secondary | ICD-10-CM | POA: Insufficient documentation

## 2023-10-16 ENCOUNTER — Ambulatory Visit

## 2023-10-22 ENCOUNTER — Other Ambulatory Visit: Payer: Self-pay | Admitting: Internal Medicine

## 2023-10-23 ENCOUNTER — Ambulatory Visit (INDEPENDENT_AMBULATORY_CARE_PROVIDER_SITE_OTHER): Admitting: Podiatry

## 2023-10-23 ENCOUNTER — Ambulatory Visit (INDEPENDENT_AMBULATORY_CARE_PROVIDER_SITE_OTHER)

## 2023-10-23 ENCOUNTER — Encounter: Payer: Self-pay | Admitting: Podiatry

## 2023-10-23 DIAGNOSIS — L97512 Non-pressure chronic ulcer of other part of right foot with fat layer exposed: Secondary | ICD-10-CM | POA: Diagnosis not present

## 2023-10-23 DIAGNOSIS — M7989 Other specified soft tissue disorders: Secondary | ICD-10-CM

## 2023-10-23 DIAGNOSIS — S92301A Fracture of unspecified metatarsal bone(s), right foot, initial encounter for closed fracture: Secondary | ICD-10-CM | POA: Diagnosis not present

## 2023-10-23 NOTE — Progress Notes (Signed)
 Pnt presents with complaint of extreme pain in the right foot has had a lot of swelling in the last.  Injured it 1 week ago.  [place] the bank.  Had immediate pain.  Was originally black and blue now it is turning somewhat green.  Also presents for follow-up on an ulcer on the hallux right.  No fever or chills or nausea or vomiting.  Physical Exam:  Patient alert and oriented x 3.  No complaints of nausea, vomiting, fever, or chills  Vascular: DP pulses 2/4 bilateral. PT pulses 1/4 lateral. Severe edema right foot.  Capillary fill time immediate.  Dermatologic: Full-thickness ulcer penetrating subcutaneous tissue plantar medial aspect hallux right.  Good base of granulation tissue present.  Devitalized tissue overlying this and around the periphery of the ulcer..  Predebridement ulcer measures 6 mm wide x 2 mm long x 3 deep.  Mild drainage present.  No signs of infection.  Ecchymosis dorsal aspect foot right.  Neurologic: Light touch intact right foot.  Monofilament sensation intact foot right  Musculoskeletal: Tenderness on the right foot over the base of the 2nd and 3rd metatarsals.  Some tenderness along the fifth metatarsal right at the insertion of the peroneus brevis.  Radiographs: 3 views foot right: Closed transverse fractures at the base of the 2nd and 3rd metatarsals.  Minimal displacement.  No bony callus noted yet.  No evidence of any TMT dislocations.  Decreased bone density foot right  Diagnoses: 1.  Closed nondisplaced fractures base of 2nd and 3rd metatarsals right 2.  Full-thickness ulceration Wagner grade 2 plantar medial aspect hallux right.  Plan: -Closed treatment closed fracture 2nd and 3rd metatarsal right foot.  Discussed at length over the treatment of the fractures.  Will keep her nonweightbearing with pneumatic cast for 4 to 6 weeks, then weightbearing with pneumatic cast for another 4 weeks. -Rx for knee scooter nonweightbearing right -Dispensed pneumatic  walker right  -Sharp debridement full-thickness ulceration hallux right.  Sharply debrided any devitalized tissue using 312 blade and tissue nipper.  Debrided any devitalized tissue down to a good bleeding base.  Minimal bleeding with minute.  Postdebridement measurements of the ulcer were eight 8 mm x 4 mm x 4 mm deep.  Applied dressing and antibiotic ointment.  Return 2 weeks f/u ulcer and follow-up fractures 2nd and 3rd metatarsals right

## 2023-10-24 ENCOUNTER — Ambulatory Visit: Admitting: Nurse Practitioner

## 2023-10-30 DIAGNOSIS — Z85528 Personal history of other malignant neoplasm of kidney: Secondary | ICD-10-CM | POA: Diagnosis not present

## 2023-10-30 DIAGNOSIS — E785 Hyperlipidemia, unspecified: Secondary | ICD-10-CM | POA: Diagnosis not present

## 2023-10-30 DIAGNOSIS — N1831 Chronic kidney disease, stage 3a: Secondary | ICD-10-CM | POA: Diagnosis not present

## 2023-10-30 DIAGNOSIS — E1122 Type 2 diabetes mellitus with diabetic chronic kidney disease: Secondary | ICD-10-CM | POA: Diagnosis not present

## 2023-10-31 DIAGNOSIS — M25562 Pain in left knee: Secondary | ICD-10-CM | POA: Diagnosis not present

## 2023-11-04 ENCOUNTER — Ambulatory Visit (INDEPENDENT_AMBULATORY_CARE_PROVIDER_SITE_OTHER)

## 2023-11-04 ENCOUNTER — Ambulatory Visit (INDEPENDENT_AMBULATORY_CARE_PROVIDER_SITE_OTHER): Admitting: Podiatry

## 2023-11-04 DIAGNOSIS — S92301A Fracture of unspecified metatarsal bone(s), right foot, initial encounter for closed fracture: Secondary | ICD-10-CM

## 2023-11-04 DIAGNOSIS — L97512 Non-pressure chronic ulcer of other part of right foot with fat layer exposed: Secondary | ICD-10-CM

## 2023-11-04 DIAGNOSIS — S92321D Displaced fracture of second metatarsal bone, right foot, subsequent encounter for fracture with routine healing: Secondary | ICD-10-CM

## 2023-11-04 NOTE — Progress Notes (Signed)
 Subjective:  Patient ID: Savannah [name], female    DOB: 09-27-1955,  MRN: [id number]  No chief complaint on file.   68 y.o. female presents for wound care.  Follow-up ulcer plantar hallux right.  Also today for follow-up on closed fractures proximal 2nd and 3rd metatarsals right.  Still in pain at the fracture sites.  She has been doing treatment for 2 weeks.  She is using a wheelchair for nonweightbearing since she fell on the scooter was very safe for her.   Review of Systems: Negative except as noted in the HPI. Denies N/V/F/Ch.  Past Medical History:  Diagnosis Date   Allergic rhinitis    Arthritis    arthritis in back   Asthma    B12 deficiency    Cancer (HCC) 2016   kidney    CKD (chronic kidney disease) 04/20/2020   stage 3 per OV note on 04/20/20 from Dr. 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   Hypercholesterolemia    Hypertension    Hypothyroidism    endocrinologist -  dr ellision   Iron deficiency anemia    Lumbar spondylosis    NAFLD (nonalcoholic fatty liver disease)    dx 2010--- last hepatic panel in epic dated 04-17-2017   Nocturia    OA (osteoarthritis of spine)    Lumbar   OSA on CPAP pulmologist-  dr shellia   sleep study 09-11-2004  very severe osa , AHI 119/hr,  setting 10   Peripheral neuropathy    bottom of feet   Renal calculus, right    Sleep apnea    uses CPAP   Type 2 diabetes mellitus (HCC)    endocrinoloigst-  dr kassie,  last A1c 5.9 on 02-20-219 in epic   Vitamin B 12 deficiency    Wears glasses     Current Outpatient Medications:    albuterol  (PROAIR  HFA) 108 (90 Base) MCG/ACT inhaler, Inhale 2 puffs into the lungs every 4 (four) hours as needed for wheezing  or shortness of breath (cough)., Disp: 3 each, Rfl: 3   atorvastatin  (LIPITOR) 40 MG tablet, Take 1 tablet (40 mg total) by mouth daily., Disp: 90 tablet, Rfl: 3   cholecalciferol (VITAMIN D ) 1000 units tablet, Take 1,000 Units by mouth daily., Disp: , Rfl:    citalopram  (CELEXA ) 20 MG tablet, TAKE 1 TABLET BY MOUTH EVERY DAY IN THE MORNING, Disp: 90 tablet, Rfl: 2   collagenase (SANTYL) ointment, Apply 1 application topically daily., Disp: , Rfl:    eszopiclone  (LUNESTA ) 2 MG TABS tablet, TAKE 1 TABLET BY MOUTH IMMEDIATELY BEFORE BEDTIME AS NEEDED FOR SLEEP, Disp: 90 tablet, Rfl: 1   FARXIGA  10 MG TABS tablet, TAKE 1 TABLET BY MOUTH DAILY BEFORE BREAKFAST., Disp: 90 tablet, Rfl: 3   fluticasone -salmeterol (ADVAIR) 100-50 MCG/ACT AEPB, Inhale 1 puff into the lungs 2 (two) times daily., Disp: 3 each, Rfl: 3   gabapentin  (NEURONTIN ) 300 MG capsule, TAKE 1 CAPSULE BY MOUTH EVERYDAY AT BEDTIME, Disp: 90 capsule, Rfl: 1   levothyroxine  (SYNTHROID ) 112 MCG tablet, TAKE 1 TABLET BY MOUTH EVERY DAY, Disp: 90 tablet, Rfl: 3   losartan  (COZAAR ) 100 MG tablet, TAKE  1 TABLET BY MOUTH EVERY DAY, Disp: 90 tablet, Rfl: 3   metFORMIN  (GLUCOPHAGE -XR) 500 MG 24 hr tablet, TAKE 2 TABLETS BY MOUTH TWICE A DAY, Disp: 360 tablet, Rfl: 3   metoprolol  succinate (TOPROL -XL) 25 MG 24 hr tablet, TAKE 1/2 TABLET BY MOUTH DAILY, Disp: 45 tablet, Rfl: 1   MOUNJARO 2.5 MG/0.5ML Pen, SMARTSIG:2.5 Milligram(s) SUB-Q Once a Week, Disp: , Rfl:    ondansetron  (ZOFRAN -ODT) 4 MG disintegrating tablet, Take 1 tablet (4 mg total) by mouth every 8 (eight) hours as needed for nausea or vomiting., Disp: 20 tablet, Rfl: 0   Oxycodone  HCl 10 MG TABS, Take 1 tablet (10 mg total) by mouth every 6 (six) hours as needed (breakthrogh pain post-operatively)., Disp: 10 tablet, Rfl: 0   RESTASIS 0.05 % ophthalmic emulsion, Place 1 drop into both eyes 2 (two) times daily., Disp: , Rfl:    Semaglutide ,0.25 or 0.5MG /DOS, (OZEMPIC , 0.25 OR 0.5 MG/DOSE,) 2  MG/3ML SOPN, Take 0.5 mg subcutaneous once weekly E11.9, Disp: 3 mL, Rfl: 11   SUMAtriptan  (IMITREX ) 100 MG tablet, Take 1 tablet (100 mg total) by mouth every 2 (two) hours as needed for migraine or headache. May repeat in 2 hours if headache persists or recurs., Disp: 10 tablet, Rfl: 5   zolpidem  (AMBIEN  CR) 12.5 MG CR tablet, TAKE 1 TABLET (12.5 MG TOTAL) BY MOUTH AT BEDTIME AS NEEDED. FOR SLEEP, Disp: 90 tablet, Rfl: 1  Social History   Tobacco Use  Smoking Status Former   Current packs/day: 0.00   Average packs/day: 0.3 packs/day for 2.0 years (0.6 ttl pk-yrs)   Types: Cigarettes   Start date: 02/26/1973   Quit date: 02/27/1975   Years since quitting: 48.7  Smokeless Tobacco Never    Allergies  Allergen Reactions   Erythromycin Nausea And Vomiting   Objective:  There were no vitals filed for this visit. There is no height or weight on file to calculate BMI. Constitutional Well developed. Well nourished.  Vascular Dorsalis pedis pulses palpable bilaterally. Posterior tibial pulses palpable bilaterally. Capillary refill normal to all digits.  No cyanosis or clubbing noted. Pedal hair growth normal.  Neurologic Normal speech. Oriented to person, place, and time. Protective sensation absent  Dermatologic Wound Location: Plantar hallux right Wound Base: Mixed Granular/Fibrotic Peri-wound: Macerated Exudate: Moderate amount Serous exudate Wound Measurements: - 4 mm x 2 mm x 3 mm deep  Ecchymosis dorsal and plantar aspect foot from fracture resolving.  Orthopedic: Pain with palpation over fracture sites 2nd and 3rd metatarsal right   Radiographs: 3 views right foot: Good healing at fracture sites with good alignment and minimal displacement. Assessment:  1.  Closed nondisplaced fracture base of 2nd and 3rd metatarsals right, subsequent encounter. 2.  Full-thickness Wagner grade 2 ulceration hallux right  Plan:  Patient was evaluated and treated and all questions  answered.  Continue nonweightbearing right foot with pneumatic cast in place for close fractures proximal 2nd and 3rd metatarsals right.  Will keep nonweightbearing for another 4 weeks  Ulcer plantar hallux right -Debridement as below. -Dressed with Silvadene cream, DSD. -Continue off-loading with pneumatic cast right and nonweightbearing for fractures metatarsals right  Procedure: Excisional Debridement of Wound Tool: Sharp #312 chisel blade/tissue nipper Type of Debridement: Sharp Excisional Frequency: @2  weeks until appropriately healed.  Dressing is to be changed daily/keeping the wound clean and dry Rationale: Removal of non-viable soft tissue from the wound to promote healing.  Anesthesia: none Pre-Debridement Wound Measurements: 0.4 cm x 0.2 cm x 0.3  cm  Post-Debridement Wound Measurements: 0.6 cm x 0.3 cm x 0.4 cm  Area devitalized tissue removed(nonviable tissue only): 0.6 cm x 0.3 cm.  Blood loss: Minimal (<50cc) Depth of Debridement: with fat layer exposed Description of tissue removed: Devitalized Tissue Technique: The wound and the surrounding skin were prepped and draped in usual aseptic fashion.  Aseptic technique was maintained throughout the procedure.  Using #312 blade/tissue nipper sharp debridement of necrotic/nonviable tissue was performed until healthy bleeding wound bed was achieved.  No underlying bone or tendon was exposed during debridement.  The wound was thoroughly irrigated with normal saline solution Wound Progress:  Current Wound Volume: Debridement was performed of the chronic nonhealing diabetic foot wound on left foot great toe. debridement removed 0.6 cm x 0.3 cm of the necrotic tissue and subcutaneous tissue and light amount of purulent drainage was not present. Presence/absence of tissue: Necrotic tissue/nonviable tissue present at the base of the wound.  Sharp debridement was performed to remove the necrotic tissue/nonviable tissue back to viable tissue.   No devitalized/nonviable tissue present postdebridement.  Wound appeared clean and clear of infection No material in the wound was present that was identified to be inhibiting healing. Dressing: Dry, sterile, compression dressing. Disposition: Patient tolerated procedure well. Patient to return in 2 week for follow-up or as listed above.  No follow-ups on file.

## 2023-11-06 ENCOUNTER — Ambulatory Visit

## 2023-11-20 ENCOUNTER — Ambulatory Visit: Admitting: Podiatry

## 2023-12-04 ENCOUNTER — Encounter: Payer: Self-pay | Admitting: Podiatry

## 2023-12-04 ENCOUNTER — Ambulatory Visit: Admitting: Podiatry

## 2023-12-04 ENCOUNTER — Ambulatory Visit

## 2023-12-04 DIAGNOSIS — S92321D Displaced fracture of second metatarsal bone, right foot, subsequent encounter for fracture with routine healing: Secondary | ICD-10-CM

## 2023-12-04 DIAGNOSIS — L97512 Non-pressure chronic ulcer of other part of right foot with fat layer exposed: Secondary | ICD-10-CM

## 2023-12-04 NOTE — Progress Notes (Signed)
 Subjective:  Patient ID: Savannah [name], female    DOB: 28-Jan-1956,  MRN: [id number]  No chief complaint on file.   68 y.o. female presents for wound care.  Ulceration hallux right.  Also here for follow-up fracture base of 2nd and 3rd metatarsal right foot.  Still in pain in the foot but is less than it was initially.   Review of Systems: Negative except as noted in the HPI. Denies N/V/F/Ch.  Past Medical History:  Diagnosis Date   Allergic rhinitis    Arthritis    arthritis in back   Asthma    B12 deficiency    Cancer (HCC) 2016   kidney    CKD (chronic kidney disease) 04/20/2020   stage 3 per OV note on 04/20/20 from Dr. 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   Hypercholesterolemia    Hypertension    Hypothyroidism    endocrinologist -  dr ellision   Iron deficiency anemia    Lumbar spondylosis    NAFLD (nonalcoholic fatty liver disease)    dx 2010--- last hepatic panel in epic dated 04-17-2017   Nocturia    OA (osteoarthritis of spine)    Lumbar   OSA on CPAP pulmologist-  dr shellia   sleep study 09-11-2004  very severe osa , AHI 119/hr,  setting 10   Peripheral neuropathy    bottom of feet   Renal calculus, right    Sleep apnea    uses CPAP   Type 2 diabetes mellitus (HCC)    endocrinoloigst-  dr kassie,  last A1c 5.9 on 02-20-219 in epic   Vitamin B 12 deficiency    Wears glasses     Current Outpatient Medications:    albuterol  (PROAIR  HFA) 108 (90 Base) MCG/ACT inhaler, Inhale 2 puffs into the lungs every 4 (four) hours as needed for wheezing or shortness of breath (cough)., Disp: 3 each, Rfl: 3   atorvastatin  (LIPITOR) 40 MG tablet, Take 1 tablet (40 mg total) by mouth daily.,  Disp: 90 tablet, Rfl: 3   cholecalciferol (VITAMIN D ) 1000 units tablet, Take 1,000 Units by mouth daily., Disp: , Rfl:    citalopram  (CELEXA ) 20 MG tablet, TAKE 1 TABLET BY MOUTH EVERY DAY IN THE MORNING, Disp: 90 tablet, Rfl: 2   collagenase (SANTYL) ointment, Apply 1 application topically daily., Disp: , Rfl:    eszopiclone  (LUNESTA ) 2 MG TABS tablet, TAKE 1 TABLET BY MOUTH IMMEDIATELY BEFORE BEDTIME AS NEEDED FOR SLEEP, Disp: 90 tablet, Rfl: 1   FARXIGA  10 MG TABS tablet, TAKE 1 TABLET BY MOUTH DAILY BEFORE BREAKFAST., Disp: 90 tablet, Rfl: 3   fluticasone -salmeterol (ADVAIR) 100-50 MCG/ACT AEPB, Inhale 1 puff into the lungs 2 (two) times daily., Disp: 3 each, Rfl: 3   gabapentin  (NEURONTIN ) 300 MG capsule, TAKE 1 CAPSULE BY MOUTH EVERYDAY AT BEDTIME, Disp: 90 capsule, Rfl: 1   levothyroxine  (SYNTHROID ) 112 MCG tablet, TAKE 1 TABLET BY MOUTH EVERY DAY, Disp: 90 tablet, Rfl: 3   losartan  (COZAAR ) 100 MG tablet, TAKE 1 TABLET BY MOUTH EVERY DAY, Disp: 90 tablet, Rfl: 3   metFORMIN  (GLUCOPHAGE -XR) 500 MG 24 hr tablet, TAKE 2 TABLETS BY  MOUTH TWICE A DAY, Disp: 360 tablet, Rfl: 3   metoprolol  succinate (TOPROL -XL) 25 MG 24 hr tablet, TAKE 1/2 TABLET BY MOUTH DAILY, Disp: 45 tablet, Rfl: 1   MOUNJARO 2.5 MG/0.5ML Pen, SMARTSIG:2.5 Milligram(s) SUB-Q Once a Week, Disp: , Rfl:    ondansetron  (ZOFRAN -ODT) 4 MG disintegrating tablet, Take 1 tablet (4 mg total) by mouth every 8 (eight) hours as needed for nausea or vomiting., Disp: 20 tablet, Rfl: 0   Oxycodone  HCl 10 MG TABS, Take 1 tablet (10 mg total) by mouth every 6 (six) hours as needed (breakthrogh pain post-operatively)., Disp: 10 tablet, Rfl: 0   RESTASIS 0.05 % ophthalmic emulsion, Place 1 drop into both eyes 2 (two) times daily., Disp: , Rfl:    Semaglutide ,0.25 or 0.5MG /DOS, (OZEMPIC , 0.25 OR 0.5 MG/DOSE,) 2 MG/3ML SOPN, Take 0.5 mg subcutaneous once weekly E11.9, Disp: 3 mL, Rfl: 11   SUMAtriptan  (IMITREX ) 100 MG tablet, Take 1 tablet (100 mg  total) by mouth every 2 (two) hours as needed for migraine or headache. May repeat in 2 hours if headache persists or recurs., Disp: 10 tablet, Rfl: 5   zolpidem  (AMBIEN  CR) 12.5 MG CR tablet, TAKE 1 TABLET (12.5 MG TOTAL) BY MOUTH AT BEDTIME AS NEEDED. FOR SLEEP, Disp: 90 tablet, Rfl: 1  Social History   Tobacco Use  Smoking Status Former   Current packs/day: 0.00   Average packs/day: 0.3 packs/day for 2.0 years (0.6 ttl pk-yrs)   Types: Cigarettes   Start date: 02/26/1973   Quit date: 02/27/1975   Years since quitting: 48.8  Smokeless Tobacco Never    Allergies  Allergen Reactions   Erythromycin Nausea And Vomiting   Objective:  There were no vitals filed for this visit. There is no height or weight on file to calculate BMI. Constitutional Well developed. Well nourished.  Vascular Dorsalis pedis pulses palpable bilaterally. Posterior tibial pulses palpable bilaterally. Capillary refill normal to all digits.  No cyanosis or clubbing noted. Pedal hair growth normal.  Neurologic Normal speech. Oriented to person, place, and time. Protective sensation absent  Dermatologic Wound Location: Right great toe Wound Base: Mixed Granular/Fibrotic Peri-wound: Normal Exudate: Moderate amount Serous exudate Wound Measurements: - 0.4 cm x 0.4 cm predebridement  Orthopedic: No pain to palpation either foot.   Radiographs:  3 views foot right: Good healing and good alignment fractures base of 2nd and 3rd metatarsal.  Some early callus formation present.  No subcutaneous emphysema noted.  No evidence osteomyelitis hallux right.  Assessment:   1. Closed displaced fracture of second metatarsal bone of right foot with routine healing, subsequent encounter   2.  Full-thickness ulceration Wagner grade 2 hallux right Plan:  Patient was evaluated and treated and all questions answered.  Ulcer Wagner grade 2 hallux right penetrating the subcutaneous tissue. -Debridement as below. -Dressed  with Betadine ointment, DSD. -Continue nonweightbearing right limb.  She is using a wheelchair.  Says she could not manage with the knee scooter.  Procedure: Excisional Debridement of Wound Tool: Sharp #312 chisel blade/tissue nipper Type of Debridement: Sharp Excisional Frequency: @Every  2 weeks until appropriately healed.  Dressing is to be changed daily/keeping the wound clean and dry Rationale: Removal of non-viable soft tissue from the wound to promote healing.  Anesthesia: none Pre-Debridement Wound Measurements: 0.4 cm x 0.4 cm x 0.2 cm  Post-Debridement Wound Measurements: 0.5 cm x 0.5 cm x 0.3 cm  Are a devitalized tissue removed(nonviable tissue only): 0.5 cm x 0.5 cm.  Blood loss: Minimal (<  50cc) Depth of Debridement: with fat layer exposed Description of tissue removed: Devitalized Tissue Technique: The wound and the surrounding skin were prepped and draped in usual aseptic fashion.  Aseptic technique was maintained throughout the procedure.  Using #312 blade/tissue nipper sharp debridement of necrotic/nonviable tissue was performed until healthy bleeding wound bed was achieved.  No underlying bone or tendon was exposed during debridement.  The wound was thoroughly irrigated with normal saline solution Wound Progress:  Current Wound Volume: Debridement was performed of the chronic nonhealing diabetic foot wound on right foot hallux right.  Debridement removed 0.5 cm x 0.5 cm of the necrotic tissue and subcutaneous tissue and none amount of purulent drainage was not present. Presence/absence of tissue: Necrotic tissue/nonviable tissue present at the base of the wound.  Sharp debridement was performed to remove the necrotic tissue/nonviable tissue back to viable tissue.  No devitalized/nonviable tissue present postdebridement.  Wound appeared clean and clear of infection No material in the wound was present that was identified to be inhibiting healing. Dressing: Dry, sterile,  compression dressing. Disposition: Patient tolerated procedure well. Patient to return in 1 week for follow-up or as listed above.  Clinically and radiographically the fracture of the 2nd and 3rd metatarsal bases right appear to be healing well.  Return 2 weeks follow-up ulceration the fifth toe right and fracture 2nd and 3rd metatarsal bases right.  Radiographs 3 views right foot

## 2023-12-06 ENCOUNTER — Other Ambulatory Visit: Payer: Self-pay

## 2023-12-06 ENCOUNTER — Other Ambulatory Visit: Payer: Self-pay | Admitting: Internal Medicine

## 2023-12-19 ENCOUNTER — Ambulatory Visit

## 2023-12-19 ENCOUNTER — Ambulatory Visit (INDEPENDENT_AMBULATORY_CARE_PROVIDER_SITE_OTHER): Admitting: Podiatry

## 2023-12-19 DIAGNOSIS — S92321D Displaced fracture of second metatarsal bone, right foot, subsequent encounter for fracture with routine healing: Secondary | ICD-10-CM

## 2023-12-19 DIAGNOSIS — L97522 Non-pressure chronic ulcer of other part of left foot with fat layer exposed: Secondary | ICD-10-CM | POA: Diagnosis not present

## 2023-12-19 DIAGNOSIS — S92301D Fracture of unspecified metatarsal bone(s), right foot, subsequent encounter for fracture with routine healing: Secondary | ICD-10-CM

## 2023-12-19 NOTE — Progress Notes (Signed)
 Subjective:  Patient ID: Savannah [name], female    DOB: [birth year],  MRN: [id number]  No chief complaint on file.   68 y.o. female presents for wound care.  Presents for follow-up ulceration hallux right and also closed fractures of 2nd and 3rd metatarsal bases right.  Still doing wound care.  She does note that she had 2 falls where she got extremely dizzy.  Less pain at the fracture sites   Review of Systems: Negative except as noted in the HPI. Denies N/V/F/Ch.  Past Medical History:  Diagnosis Date   Allergic rhinitis    Arthritis    arthritis in back   Asthma    B12 deficiency    Cancer (HCC) 2016   kidney    CKD (chronic kidney disease) 04/20/2020   stage 3 per OV note on 04/20/20 from Dr. 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   Hypercholesterolemia    Hypertension    Hypothyroidism    endocrinologist -  dr ellision   Iron deficiency anemia    Lumbar spondylosis    NAFLD (nonalcoholic fatty liver disease)    dx 2010--- last hepatic panel in epic dated 04-17-2017   Nocturia    OA (osteoarthritis of spine)    Lumbar   OSA on CPAP pulmologist-  dr shellia   sleep study 09-11-2004  very severe osa , AHI 119/hr,  setting 10   Peripheral neuropathy    bottom of feet   Renal calculus, right    Sleep apnea    uses CPAP   Type 2 diabetes mellitus (HCC)    endocrinoloigst-  dr kassie,  last A1c 5.9 on 02-20-219 in epic   Vitamin B 12 deficiency    Wears glasses     Current Outpatient Medications:    albuterol  (PROAIR  HFA) 108 (90 Base) MCG/ACT inhaler, Inhale 2 puffs into the lungs every 4 (four) hours as needed for wheezing or shortness of breath (cough)., Disp: 3 each, Rfl: 3    atorvastatin  (LIPITOR) 40 MG tablet, Take 1 tablet (40 mg total) by mouth daily., Disp: 90 tablet, Rfl: 3   cholecalciferol (VITAMIN D ) 1000 units tablet, Take 1,000 Units by mouth daily., Disp: , Rfl:    citalopram  (CELEXA ) 20 MG tablet, TAKE 1 TABLET BY MOUTH EVERY DAY IN THE MORNING, Disp: 90 tablet, Rfl: 2   collagenase (SANTYL) ointment, Apply 1 application topically daily., Disp: , Rfl:    eszopiclone  (LUNESTA ) 2 MG TABS tablet, TAKE 1 TABLET BY MOUTH IMMEDIATELY BEFORE BEDTIME AS NEEDED FOR SLEEP, Disp: 90 tablet, Rfl: 1   FARXIGA  10 MG TABS tablet, TAKE 1 TABLET BY MOUTH DAILY BEFORE BREAKFAST., Disp: 90 tablet, Rfl: 3   fluticasone -salmeterol (ADVAIR) 100-50 MCG/ACT AEPB, Inhale 1 puff into the lungs 2 (two) times daily., Disp: 3 each, Rfl: 3   gabapentin  (NEURONTIN ) 300 MG capsule, TAKE 1 CAPSULE BY MOUTH EVERYDAY AT BEDTIME, Disp: 90 capsule, Rfl: 1   levothyroxine  (SYNTHROID ) 112 MCG tablet, TAKE 1 TABLET BY MOUTH EVERY DAY, Disp: 90 tablet, Rfl: 3   losartan  (COZAAR ) 100 MG tablet, TAKE 1 TABLET BY MOUTH EVERY DAY, Disp: 90 tablet, Rfl: 3  metFORMIN  (GLUCOPHAGE -XR) 500 MG 24 hr tablet, TAKE 2 TABLETS BY MOUTH TWICE A DAY, Disp: 360 tablet, Rfl: 3   metoprolol  succinate (TOPROL -XL) 25 MG 24 hr tablet, TAKE 1/2 TABLET BY MOUTH DAILY, Disp: 45 tablet, Rfl: 1   MOUNJARO 2.5 MG/0.5ML Pen, SMARTSIG:2.5 Milligram(s) SUB-Q Once a Week, Disp: , Rfl:    ondansetron  (ZOFRAN -ODT) 4 MG disintegrating tablet, Take 1 tablet (4 mg total) by mouth every 8 (eight) hours as needed for nausea or vomiting., Disp: 20 tablet, Rfl: 0   Oxycodone  HCl 10 MG TABS, Take 1 tablet (10 mg total) by mouth every 6 (six) hours as needed (breakthrogh pain post-operatively)., Disp: 10 tablet, Rfl: 0   RESTASIS 0.05 % ophthalmic emulsion, Place 1 drop into both eyes 2 (two) times daily., Disp: , Rfl:    Semaglutide ,0.25 or 0.5MG /DOS, (OZEMPIC , 0.25 OR 0.5 MG/DOSE,) 2 MG/3ML SOPN, Take 0.5 mg subcutaneous once weekly E11.9,  Disp: 3 mL, Rfl: 11   SUMAtriptan  (IMITREX ) 100 MG tablet, Take 1 tablet (100 mg total) by mouth every 2 (two) hours as needed for migraine or headache. May repeat in 2 hours if headache persists or recurs., Disp: 10 tablet, Rfl: 5   zolpidem  (AMBIEN  CR) 12.5 MG CR tablet, TAKE 1 TABLET (12.5 MG TOTAL) BY MOUTH AT BEDTIME AS NEEDED. FOR SLEEP, Disp: 90 tablet, Rfl: 1  Social History   Tobacco Use  Smoking Status Former   Current packs/day: 0.00   Average packs/day: 0.3 packs/day for 2.0 years (0.6 ttl pk-yrs)   Types: Cigarettes   Start date: 02/26/1973   Quit date: 02/27/1975   Years since quitting: 48.8  Smokeless Tobacco Never    Allergies  Allergen Reactions   Erythromycin Nausea And Vomiting   Objective:  There were no vitals filed for this visit. There is no height or weight on file to calculate BMI. Constitutional Well developed. Well nourished.  Vascular Dorsalis pedis pulses palpable bilaterally. Posterior tibial pulses palpable bilaterally. Capillary refill normal to all digits.  No cyanosis or clubbing noted. Pedal hair growth normal.  Neurologic Normal speech. Oriented to person, place, and time. Protective sensation absent  Dermatologic Wound Location: Plantar medial aspect hallux right Wound Base: Mixed Granular/Fibrotic Peri-wound: Calloused Exudate: Moderate amount Serous exudate Wound Measurements: - 4 mm x 3 mm x 3 mm deep predebridement  Orthopedic: No pain to palpation either foot.   Radiographs: 3 views right foot reveal good healing at metatarsal base fractures second third metatarsals.  No displacement noted appears to be osseous bridging present.  Inking is increased consolidation and bony callus. Assessment:   1. 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  Closed fracture base 2nd and 3rd metatarsals right, routine healing, subsequent visit right Plan:  Patient was evaluated and treated and all questions answered.  Ulcer plantar medial  aspect hallux right Wagner grade 2 -Debridement as below. -Dressed with Betadine ointment, DSD. -Continue off-loading with pneumatic cast right. -Continue closed treatment fracture 2nd and 3rd metatarsals right with pneumatic cast.  Patient appears to be healing fractures well  Procedure: Excisional Debridement of Wound Tool: Sharp #312 chisel blade/tissue nipper Type of Debridement: Sharp Excisional Frequency: @periodically  until appropriately healed.  Dressing is to be changed daily/keeping the wound clean and dry Rationale: Removal of non-viable soft tissue from the wound to promote healing.  Anesthesia: none Pre-Debridement Wound Measurements: 0.4 cm x 0.3 cm x 0.3 cm  Post-Debridement Wound Measurements: 0.7 cm x 0.5 cm x 0.4 cm  Area devitalized tissue removed(nonviable tissue only): 0.7 cm x 0.5 cm.  Blood loss: Minimal (<50cc) Depth of Debridement: with fat layer exposed Description of tissue removed: Devitalized Tissue Technique: The wound and the surrounding skin were prepped and draped in usual aseptic fashion.  Aseptic technique was maintained throughout the procedure.  Using #312 blade/tissue nipper sharp debridement of necrotic/nonviable tissue was performed until healthy bleeding wound bed was achieved.  No underlying bone or tendon was exposed during debridement.  The wound was thoroughly irrigated with normal saline solution Wound Progress:  Current Wound Volume: Debridement was performed of the chronic nonhealing diabetic foot wound on right foot hallux right.  Debridement removed 0.7 cm x 0.5 cm of the necrotic tissue and subcutaneous tissue and none amount of purulent drainage was not present. Presence/absence of tissue: Necrotic tissue/nonviable tissue present at the base of the wound.  Sharp debridement was performed to remove the necrotic tissue/nonviable tissue back to viable tissue.  No devitalized/nonviable tissue present postdebridement.  Wound appeared clean and clear  of infection No material in the wound was present that was identified to be inhibiting healing. Dressing: Dry, sterile, compression dressing. Disposition: Patient tolerated procedure well. Patient to return in 2 week for follow-up follow-up ulcer and fracture 2nd and 3rd metatarsals right.  Radiographs 3 views right foot next visit

## 2023-12-20 ENCOUNTER — Other Ambulatory Visit: Payer: Self-pay

## 2023-12-20 ENCOUNTER — Other Ambulatory Visit: Payer: Self-pay | Admitting: Internal Medicine

## 2023-12-29 ENCOUNTER — Other Ambulatory Visit: Payer: Self-pay | Admitting: Internal Medicine

## 2023-12-29 DIAGNOSIS — E119 Type 2 diabetes mellitus without complications: Secondary | ICD-10-CM

## 2023-12-30 ENCOUNTER — Other Ambulatory Visit: Payer: Self-pay

## 2024-01-01 ENCOUNTER — Ambulatory Visit: Admitting: Nurse Practitioner

## 2024-01-01 ENCOUNTER — Encounter: Payer: Self-pay | Admitting: Nurse Practitioner

## 2024-01-01 VITALS — BP 124/77 | HR 101 | Temp 98.0°F | Ht 68.0 in | Wt 174.0 lb

## 2024-01-01 DIAGNOSIS — J309 Allergic rhinitis, unspecified: Secondary | ICD-10-CM | POA: Diagnosis not present

## 2024-01-01 DIAGNOSIS — F5101 Primary insomnia: Secondary | ICD-10-CM

## 2024-01-01 DIAGNOSIS — G4700 Insomnia, unspecified: Secondary | ICD-10-CM

## 2024-01-01 DIAGNOSIS — G4733 Obstructive sleep apnea (adult) (pediatric): Secondary | ICD-10-CM | POA: Diagnosis not present

## 2024-01-01 DIAGNOSIS — Z87891 Personal history of nicotine dependence: Secondary | ICD-10-CM

## 2024-01-01 NOTE — Patient Instructions (Signed)
 Continue to use CPAP every night, minimum of 4-6 hours a night.  Change equipment as directed. Wash your tubing with warm soap and water  daily, hang to dry. Wash humidifier portion weekly. Use bottled, distilled water  and change daily Be aware of reduced alertness and do not drive or operate heavy machinery if experiencing this or drowsiness.  Exercise encouraged, as tolerated. Healthy weight management discussed.  Avoid or decrease alcohol consumption and medications that make you more sleepy, if possible. Notify if persistent daytime sleepiness occurs even with consistent use of PAP therapy.  Change CPAP supplies... Every month Mask cushions and/or nasal pillows CPAP machine filters Every 3 months Mask frame (not including the headgear) CPAP tubing Every 6 months Mask headgear Chin strap (if applicable) Humidifier water  tub  Set up your CPAP with [facility] to establish a medical supply company and so we can get data off your machine You'll be due for a new machine in 2027  Try flonase nasal spray or store brand version 2 sprays each nostril daily as needed for congestion/allergies Recommend over the counter non drowsy antihistamine such as claritin, zyrtec, xyzal   Follow up in 1 year, or sooner, if needed

## 2024-01-01 NOTE — Progress Notes (Signed)
 @Patient  ID: Savannah [name], female    DOB: [birth year], 68 y.o.   MRN: [id number]  Chief Complaint  Patient presents with   Consult    Used to see dr shellia needs a new cpap machine but still uses cpap machine from him     Referring provider: Norleen Lynwood LELON, MD  HPI: 68 year old female, former smoker previously followed for OSA on CPAP. She is a former patient of Dr. Magdaleno and last seen in office 2022. Past medical history significant for HTN, cardiomegaly, asthma, thyroid  disease, NASH, CKD.   TEST/EVENTS:  09/11/2004 PSG: AHI 119/h, SpO2 low 75%  07/2020: OV with Dr. Shellia. Has nasal mask with CPAP. Machine is old. Orders placed for new CPAP machine. Uses trazodone  for sleep.   01/01/2024: Today - overdue follow up Discussed the use of AI scribe software for clinical note transcription with the patient, who gave verbal consent to proceed.  History of Present Illness Savannah [name] is a 68 year old female with sleep apnea who presents for follow-up regarding her CPAP machine. She is accompanied by her granddaughter, Izetta.  She received a new CPAP machine after a delay due to manufacturing issues sometime in 2022. She uses the CPAP consistently, even while watching TV at night, as she feels 'smothered' when lying down. The light on the front of the machine is dim, making it difficult to see the program settings, but her husband helps with this. She previously obtained supplies from [facility] but now orders them online. She uses a nose cushion mask and her husband adjusted the machine to make the air warmer, which she finds more comfortable.  She experiences insomnia, often staying awake until 1 or 2 AM despite taking a sleeping pill around 8 or 9 PM. She reports that she enjoys sleeping in the morning and usually feels rested upon waking. She shares similar sleep patterns with her granddaughter. She denies any issues with drowsy driving, sleep parasomnias. Pressure on CPAP feels  appropriate. No issues with use.   She has a history of allergies, and reports that her allergies have been 'kicking'. She has a lot of sneezing. No issues with her breathing. She does not take any antihistamines/allergy meds consistently.   No download available     Allergies  Allergen Reactions   Erythromycin Nausea And Vomiting    Immunization History  Administered Date(s) Administered   Fluad Quad(high Dose 65+) 12/09/2019, 12/31/2020, 01/24/2022   Influenza Inj Mdck Quad Pf 12/20/2016   Influenza Split 12/29/2010, 12/28/2011   Influenza Whole 12/16/2007, 12/06/2008, 12/26/2009   Influenza,inj,Quad PF,6+ Mos 04/15/2015, 01/06/2016, 11/07/2017, 02/04/2019   Influenza-Unspecified 11/26/2013, 12/27/2016   PFIZER(Purple Top)SARS-COV-2 Vaccination 05/22/2019, 06/15/2019, 01/01/2020   Pneumococcal Conjugate-13 10/16/2019   Pneumococcal Polysaccharide-23 05/09/2010, 04/17/2017   Td 11/16/2004   Tdap 04/17/2016   Zoster, Live 04/15/2015    Past Medical History:  Diagnosis Date   Allergic rhinitis    Arthritis    arthritis in back   Asthma    B12 deficiency    Cancer (HCC) 2016   kidney    CKD (chronic kidney disease) 04/20/2020   stage 3 per OV note on 04/20/20 from Dr. Lynwood Norleen   Depression    Grade I internal hemorrhoids    Hiatal hernia    History of colon polyps    History of kidney stones    History of renal cell carcinoma urologist-  dr alvaro   dx 2016--  s/p 05-19-2014 partial right  nephrectomy -- Clear Cell carcinoma (pT1aNxMx),  clinically  localized w/ negative margins   Hypercholesterolemia    Hypertension    Hypothyroidism    endocrinologist -  dr ellision   Iron deficiency anemia    Lumbar spondylosis    NAFLD (nonalcoholic fatty liver disease)    dx 2010--- last hepatic panel in epic dated 04-17-2017   Nocturia    OA (osteoarthritis of spine)    Lumbar   OSA on CPAP pulmologist-  dr shellia   sleep study 09-11-2004  very severe osa , AHI 119/hr,   setting 10   Peripheral neuropathy    bottom of feet   Renal calculus, right    Sleep apnea    uses CPAP   Type 2 diabetes mellitus (HCC)    endocrinoloigst-  dr kassie,  last A1c 5.9 on 02-20-219 in epic   Vitamin B 12 deficiency    Wears glasses     Tobacco History: Social History   Tobacco Use  Smoking Status Former   Current packs/day: 0.00   Average packs/day: 0.3 packs/day for 2.0 years (0.6 ttl pk-yrs)   Types: Cigarettes   Start date: 02/26/1973   Quit date: 02/27/1975   Years since quitting: 48.8  Smokeless Tobacco Never   Counseling given: Not Answered   Outpatient Medications Prior to Visit  Medication Sig Dispense Refill   albuterol  (PROAIR  HFA) 108 (90 Base) MCG/ACT inhaler Inhale 2 puffs into the lungs every 4 (four) hours as needed for wheezing or shortness of breath (cough). 3 each 3   atorvastatin  (LIPITOR) 40 MG tablet Take 1 tablet (40 mg total) by mouth daily. 90 tablet 3   cholecalciferol (VITAMIN D ) 1000 units tablet Take 1,000 Units by mouth daily.     citalopram  (CELEXA ) 20 MG tablet TAKE 1 TABLET BY MOUTH EVERY DAY IN THE MORNING 90 tablet 2   collagenase (SANTYL) ointment Apply 1 application topically daily.     eszopiclone  (LUNESTA ) 2 MG TABS tablet TAKE 1 TABLET BY MOUTH IMMEDIATELY BEFORE BEDTIME AS NEEDED FOR SLEEP 90 tablet 1   FARXIGA  10 MG TABS tablet TAKE 1 TABLET BY MOUTH DAILY BEFORE BREAKFAST. 90 tablet 3   fluticasone -salmeterol (ADVAIR) 100-50 MCG/ACT AEPB Inhale 1 puff into the lungs 2 (two) times daily. 3 each 3   gabapentin  (NEURONTIN ) 300 MG capsule TAKE 1 CAPSULE BY MOUTH EVERYDAY AT BEDTIME 90 capsule 1   levothyroxine  (SYNTHROID ) 112 MCG tablet TAKE 1 TABLET BY MOUTH EVERY DAY 90 tablet 3   losartan  (COZAAR ) 100 MG tablet TAKE 1 TABLET BY MOUTH EVERY DAY 90 tablet 3   metFORMIN  (GLUCOPHAGE -XR) 500 MG 24 hr tablet TAKE 2 TABLETS BY MOUTH TWICE A DAY 360 tablet 3   metoprolol  succinate (TOPROL -XL) 25 MG 24 hr tablet TAKE 1/2 TABLET BY  MOUTH DAILY 45 tablet 1   MOUNJARO 2.5 MG/0.5ML Pen SMARTSIG:2.5 Milligram(s) SUB-Q Once a Week     ondansetron  (ZOFRAN -ODT) 4 MG disintegrating tablet Take 1 tablet (4 mg total) by mouth every 8 (eight) hours as needed for nausea or vomiting. 20 tablet 0   Oxycodone  HCl 10 MG TABS Take 1 tablet (10 mg total) by mouth every 6 (six) hours as needed (breakthrogh pain post-operatively). 10 tablet 0   RESTASIS 0.05 % ophthalmic emulsion Place 1 drop into both eyes 2 (two) times daily.     SUMAtriptan  (IMITREX ) 100 MG tablet Take 1 tablet (100 mg total) by mouth every 2 (two) hours as needed for migraine or headache.  May repeat in 2 hours if headache persists or recurs. 10 tablet 5   zolpidem  (AMBIEN  CR) 12.5 MG CR tablet TAKE 1 TABLET (12.5 MG TOTAL) BY MOUTH AT BEDTIME AS NEEDED. FOR SLEEP 90 tablet 1   Semaglutide ,0.25 or 0.5MG /DOS, (OZEMPIC , 0.25 OR 0.5 MG/DOSE,) 2 MG/3ML SOPN Take 0.5 mg subcutaneous once weekly E11.9 3 mL 11   No facility-administered medications prior to visit.     Review of Systems: as above    Physical Exam:  BP 124/77   Pulse (!) 101   Temp 98 F (36.7 C) (Oral)   Ht 5' 8 (1.727 m) Comment: per patient  Wt 276 lb (125.2 kg)   SpO2 97%   BMI 41.97 kg/m   GEN: Pleasant, interactive, well-kempt; obese; in no acute distress HEENT:  Normocephalic and atraumatic. PERRLA. Sclera white. Nasal turbinates erythematous, moist and patent bilaterally. No rhinorrhea present. Oropharynx pink and moist, without exudate or edema. No lesions, ulcerations, or postnasal drip. Mallampati III NECK:  Supple w/ fair ROM. No JVD present. No lymphadenopathy.   CV: RRR, no m/r/g PULMONARY:  Unlabored, regular breathing. Clear bilaterally A&P w/o wheezes/rales/rhonchi. No accessory muscle use.  GI: BS present and normoactive. Soft, non-tender to palpation.  Neuro: A/Ox3. No focal deficits noted.   Skin: Warm, no lesions or rashe Psych: Normal affect and behavior. Judgement and thought  content appropriate.     Lab Results:  CBC    Component Value Date/Time   WBC 9.1 09/05/2023 1405   RBC 4.80 09/05/2023 1405   HGB 14.2 09/05/2023 1405   HCT 41.4 09/05/2023 1405   PLT 235.0 09/05/2023 1405   MCV 86.2 09/05/2023 1405   MCH 29.6 01/10/2021 1326   MCHC 34.3 09/05/2023 1405   RDW 14.3 09/05/2023 1405   LYMPHSABS 2.3 09/05/2023 1405   MONOABS 0.5 09/05/2023 1405   EOSABS 0.3 09/05/2023 1405   BASOSABS 0.0 09/05/2023 1405    BMET    Component Value Date/Time   NA 137 09/05/2023 1405   K 4.1 09/05/2023 1405   CL 100 09/05/2023 1405   CO2 27 09/05/2023 1405   GLUCOSE 104 (H) 09/05/2023 1405   BUN 23 09/05/2023 1405   CREATININE 1.03 09/05/2023 1405   CREATININE 1.08 (H) 10/16/2019 1453   CALCIUM  10.0 09/05/2023 1405   CALCIUM  9.2 07/24/2011 0943   GFRNONAA 58 (L) 01/10/2021 1326   GFRNONAA 54 (L) 10/16/2019 1453   GFRAA 62 10/16/2019 1453    BNP No results found for: BNP   Imaging:  DG Foot Complete Right Result Date: 12/19/2023 Please see detailed radiograph report in office note.  DG Foot Complete Right Result Date: 12/05/2023 Please see detailed radiograph report in office note.   Administration History     None           No data to display          No results found for: NITRICOXIDE      Assessment & Plan:   No problem-specific Assessment & Plan notes found for this encounter. Assessment and Plan Assessment & Plan Obstructive sleep apnea Managed with CPAP therapy. Current CPAP machine is functional but has a dim display light. CPAP usage is consistent, and she feels well-rested when using it. Insurance will cover a new CPAP machine every 5 years; will not be due until 2027. Plan to get her set up with local DME company. Control and instrumentation engineer. Aware of risks of untreated OSA. Understands proper care/use of device. Safe driving practices reviewed.  Healthy weight loss encouraged  - Ordered CPAP data retrieval and  troubleshooting with [facility]. - Continue CPAP therapy as currently used. - Will schedule follow-up in one year unless issues arise.  Allergic rhinitis Symptoms exacerbated during fall and spring seasons. She does not consistently use antihistamines.  - Recommended consistent use of non-drowsy antihistamine during allergy season. - Advised use of Flonase nasal spray as needed for sneezing or congestion.  Insomnia Chronic insomnia with difficulty falling asleep despite taking a sleeping pill at night. She has a different sleep schedule, often staying up late and sleeping during the day. She feels well-rested when using CPAP. - Continue current sleep aid - Sleep hygiene reviewed    Advised if symptoms do not improve or worsen, to please contact office for sooner follow up or seek emergency care.   I spent 35 minutes of dedicated to the care of this patient on the date of this encounter to include pre-visit review of records, face-to-face time with the patient discussing conditions above, post visit ordering of testing, clinical documentation with the electronic health record, making appropriate referrals as documented, and communicating necessary findings to members of the patients care team.  Comer LULLA Rouleau, NP 01/01/2024  Pt aware and understands NP's role.

## 2024-01-02 ENCOUNTER — Ambulatory Visit

## 2024-01-02 ENCOUNTER — Other Ambulatory Visit: Payer: Self-pay | Admitting: Podiatry

## 2024-01-02 ENCOUNTER — Ambulatory Visit (INDEPENDENT_AMBULATORY_CARE_PROVIDER_SITE_OTHER): Admitting: Podiatry

## 2024-01-02 DIAGNOSIS — L97512 Non-pressure chronic ulcer of other part of right foot with fat layer exposed: Secondary | ICD-10-CM

## 2024-01-02 DIAGNOSIS — S92301D Fracture of unspecified metatarsal bone(s), right foot, subsequent encounter for fracture with routine healing: Secondary | ICD-10-CM

## 2024-01-02 NOTE — Progress Notes (Signed)
 Subjective:  Patient ID: Savannah [name], female    DOB: 22-Dec-1955,  MRN: [id number]  No chief complaint on file.   68 y.o. female presents for wound care ulcer hallux right and 10 weeks status post fracture 2nd and 3rd metatarsals right.  .  Still doing wound care as instructed.  Slight pain at the fracture sites at this point  Review of Systems: Negative except as noted in the HPI. Denies N/V/F/Ch.  Past Medical History:  Diagnosis Date   Allergic rhinitis    Arthritis    arthritis in back   Asthma    B12 deficiency    Cancer (HCC) 2016   kidney    CKD (chronic kidney disease) 04/20/2020   stage 3 per OV note on 04/20/20 from Dr. 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   Hypercholesterolemia    Hypertension    Hypothyroidism    endocrinologist -  dr ellision   Iron deficiency anemia    Lumbar spondylosis    NAFLD (nonalcoholic fatty liver disease)    dx 2010--- last hepatic panel in epic dated 04-17-2017   Nocturia    OA (osteoarthritis of spine)    Lumbar   OSA on CPAP pulmologist-  dr shellia   sleep study 09-11-2004  very severe osa , AHI 119/hr,  setting 10   Peripheral neuropathy    bottom of feet   Renal calculus, right    Sleep apnea    uses CPAP   Type 2 diabetes mellitus (HCC)    endocrinoloigst-  dr kassie,  last A1c 5.9 on 02-20-219 in epic   Vitamin B 12 deficiency    Wears glasses     Current Outpatient Medications:    albuterol  (PROAIR  HFA) 108 (90 Base) MCG/ACT inhaler, Inhale 2 puffs into the lungs every 4 (four) hours as needed for wheezing or shortness of breath (cough)., Disp: 3 each, Rfl: 3   atorvastatin  (LIPITOR) 40 MG tablet, Take 1 tablet (40 mg total) by mouth  daily., Disp: 90 tablet, Rfl: 3   cholecalciferol (VITAMIN D ) 1000 units tablet, Take 1,000 Units by mouth daily., Disp: , Rfl:    citalopram  (CELEXA ) 20 MG tablet, TAKE 1 TABLET BY MOUTH EVERY DAY IN THE MORNING, Disp: 90 tablet, Rfl: 2   collagenase (SANTYL) ointment, Apply 1 application topically daily., Disp: , Rfl:    eszopiclone  (LUNESTA ) 2 MG TABS tablet, TAKE 1 TABLET BY MOUTH IMMEDIATELY BEFORE BEDTIME AS NEEDED FOR SLEEP, Disp: 90 tablet, Rfl: 1   FARXIGA  10 MG TABS tablet, TAKE 1 TABLET BY MOUTH DAILY BEFORE BREAKFAST., Disp: 90 tablet, Rfl: 3   fluticasone -salmeterol (ADVAIR) 100-50 MCG/ACT AEPB, Inhale 1 puff into the lungs 2 (two) times daily., Disp: 3 each, Rfl: 3   gabapentin  (NEURONTIN ) 300 MG capsule, TAKE 1 CAPSULE BY MOUTH EVERYDAY AT BEDTIME, Disp: 90 capsule, Rfl: 1   levothyroxine  (SYNTHROID ) 112 MCG tablet, TAKE 1 TABLET BY MOUTH EVERY DAY, Disp: 90 tablet, Rfl: 3   losartan  (COZAAR ) 100 MG tablet, TAKE 1 TABLET BY MOUTH EVERY DAY, Disp: 90 tablet, Rfl: 3   metFORMIN  (GLUCOPHAGE -XR) 500 MG 24 hr tablet, TAKE 2 TABLETS BY  MOUTH TWICE A DAY, Disp: 360 tablet, Rfl: 3   metoprolol  succinate (TOPROL -XL) 25 MG 24 hr tablet, TAKE 1/2 TABLET BY MOUTH DAILY, Disp: 45 tablet, Rfl: 1   MOUNJARO 2.5 MG/0.5ML Pen, SMARTSIG:2.5 Milligram(s) SUB-Q Once a Week, Disp: , Rfl:    ondansetron  (ZOFRAN -ODT) 4 MG disintegrating tablet, Take 1 tablet (4 mg total) by mouth every 8 (eight) hours as needed for nausea or vomiting., Disp: 20 tablet, Rfl: 0   Oxycodone  HCl 10 MG TABS, Take 1 tablet (10 mg total) by mouth every 6 (six) hours as needed (breakthrogh pain post-operatively)., Disp: 10 tablet, Rfl: 0   RESTASIS 0.05 % ophthalmic emulsion, Place 1 drop into both eyes 2 (two) times daily., Disp: , Rfl:    SUMAtriptan  (IMITREX ) 100 MG tablet, Take 1 tablet (100 mg total) by mouth every 2 (two) hours as needed for migraine or headache. May repeat in 2 hours if headache persists or recurs., Disp: 10  tablet, Rfl: 5   zolpidem  (AMBIEN  CR) 12.5 MG CR tablet, TAKE 1 TABLET (12.5 MG TOTAL) BY MOUTH AT BEDTIME AS NEEDED. FOR SLEEP, Disp: 90 tablet, Rfl: 1  Social History   Tobacco Use  Smoking Status Former   Current packs/day: 0.00   Average packs/day: 0.3 packs/day for 2.0 years (0.6 ttl pk-yrs)   Types: Cigarettes   Start date: 02/26/1973   Quit date: 02/27/1975   Years since quitting: 48.8  Smokeless Tobacco Never    Allergies  Allergen Reactions   Erythromycin Nausea And Vomiting   Objective:  There were no vitals filed for this visit. There is no height or weight on file to calculate BMI. Constitutional Well developed. Well nourished.  Vascular Dorsalis pedis pulses palpable bilaterally. Posterior tibial pulses palpable bilaterally. Capillary refill normal to all digits.  No cyanosis or clubbing noted. Pedal hair growth normal.  Neurologic Normal speech. Oriented to person, place, and time. Protective sensation absent  Dermatologic Wound Location: Plantar medial aspect hallux right Wound Base: Mixed Granular/Fibrotic Peri-wound: Calloused Exudate: Moderate amount Serous exudate Wound Measurements: - 4 mm x 3 mm x 3 mm deep predebridement  Orthopedic: Slight soreness at fracture site 2nd and 3rd metatarsal bases right.  No tenderness with stressing of the 2nd or 3rd metatarsal right    Radiographs: 3 views right foot reveal good healing at metatarsal base fractures second third metatarsals.  No displacement noted.  Good osseous bridging is present at the fracture sites with consolidation of bone callus.  Good alignment metatarsal Assessment:    1. 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  Closed fracture base 2nd and 3rd metatarsals right, routine healing, subsequent visit right Plan:  Patient was evaluated and treated and all questions answered.   Ulcer plantar medial aspect hallux right Wagner grade 2 -Debridement as below. -Dressed with Betadine  ointment, DSD. -Wound care: Continue soaking in lukewarm Epsom salt water  15 minutes, apply Santyl ointment, and a light dressing., - Discontinue pneumatic cast right and switch to surgical shoe right for weightbearing. - Discontinue pneumatic cast for fracture healing.  Patient appears to be healing fractures well   Procedure: Excisional Debridement of Wound Tool: Sharp #312 chisel blade/tissue nipper Type of Debridement: Sharp Excisional Frequency: @periodically  until appropriately healed.  Dressing is to be changed daily/keeping the wound clean and dry Rationale: Removal of non-viable soft tissue from the wound to promote healing.  Anesthesia: none Pre-Debridement Wound Measurements: 0.4 cm x 0.3 cm x 0.2 cm  Post-Debridement  Wound Measurements: 0.6 cm x 0.4 cm x 0.3 cm  Area devitalized tissue removed(nonviable tissue only): 0.6 cm x 0.4 cm.  Blood loss: Minimal (<50cc) Depth of Debridement: with fat layer exposed Description of tissue removed: Devitalized Tissue Technique: The wound and the surrounding skin were prepped and draped in usual aseptic fashion.  Aseptic technique was maintained throughout the procedure.  Using #312 blade/tissue nipper sharp debridement of necrotic/nonviable tissue was performed until healthy bleeding wound bed was achieved.  No underlying bone or tendon was exposed during debridement.  The wound was thoroughly irrigated with normal saline solution Wound Progress:  Current Wound Volume: Debridement was performed of the chronic nonhealing diabetic foot wound on right foot hallux right.  Debridement removed 0.6 cm x 0.4 cm of the necrotic tissue and subcutaneous tissue and none amount of purulent drainage was not present. Presence/absence of tissue: Necrotic tissue/nonviable tissue present at the base of the wound.  Sharp debridement was performed to remove the necrotic tissue/nonviable tissue back to viable tissue.  No devitalized/nonviable tissue present  postdebridement.  Wound appeared clean and clear of infection No material in the wound was present that was identified to be inhibiting healing. Dressing: Dry, sterile, compression dressing. Disposition: Patient tolerated procedure well. Patient to return in 2 week for follow-up follow-up ulcer.

## 2024-01-06 ENCOUNTER — Ambulatory Visit
Admission: RE | Admit: 2024-01-06 | Discharge: 2024-01-06 | Disposition: A | Source: Ambulatory Visit | Attending: Internal Medicine | Admitting: Internal Medicine

## 2024-01-06 DIAGNOSIS — Z1231 Encounter for screening mammogram for malignant neoplasm of breast: Secondary | ICD-10-CM

## 2024-01-17 ENCOUNTER — Encounter: Payer: Self-pay | Admitting: Podiatry

## 2024-01-17 ENCOUNTER — Ambulatory Visit (INDEPENDENT_AMBULATORY_CARE_PROVIDER_SITE_OTHER): Admitting: Podiatry

## 2024-01-17 DIAGNOSIS — L97512 Non-pressure chronic ulcer of other part of right foot with fat layer exposed: Secondary | ICD-10-CM | POA: Diagnosis not present

## 2024-01-17 NOTE — Progress Notes (Signed)
 HPI: Patient presents for ulcer follow-up.  Ulcer hallux right.  Still looks like he is doing better.  Slight aching where the fracture sites were on the 2nd and 3rd metatarsals proximally right    Denies N/V/F/Ch.   Objective:   Vascular:  DP pulses 2/4 bilaterally. PT pulses 2/4 bilaterally.  Capillary refill time immediate bilaterally.   Neurologic: Decreased protective sensation feet bilaterally  Dermatologic: Full-thickness ulceration plantar medial aspect hallux right.. Wound base mixed fibrinous and granulation tissue but mostly granulation tissue..  Mild serous exudate.  Predebridement ulcer measures  3 mm x 3 mm x 2 deep.  Musculoskeletal:      Assessment:  Full-thickness Wagner grade 2 ulceration hallux right.  Plan:  Patient was evaluated and treated and all questions answered.  Ulcer full-thickness hallux right -Debridement as below. -Dressed with antibiotic ointment and DSD. -Continue off-loading with surgical shoe. -Wound care: Soak once daily luke warm epsom salt water  15 min . Apply Santyl ointment and dressing.  Procedure: Excisional Debridement of Wound Tool: Sharp #312 chisel blade/tissue nipper Type of Debridement: Sharp Excisional Frequency: Every 1 to 2 weeks until appropriately healed.   Rationale: Removal of non-viable soft tissue from the wound to promote healing.  Anesthesia: none  Pre-Debridement Wound Measurements: 3 mm x 3 mm x 2 mm deep Post-Debridement Wound Measurements: 6 mm x 4 mm x 3 mm deep Area devitalized tissue removed(nonviable tissue only): 6 mm x 4 mm.  Blood loss: Minimal (<50cc) Depth of Debridement: Full-thickness into subcutaneous tissue  Description of tissue removed: Devitalized tissue Technique: The wound and the surrounding skin were prepped and draped in usual aseptic fashion.  Aseptic technique was maintained throughout the procedure.  Using #312 blade/tissue nipper sharp debridement of necrotic/nonviable tissue was  performed until healthy bleeding wound bed was achieved.  No underlying bone or tendon was exposed during debridement.  The wound was thoroughly irrigated with normal saline solution Wound Progress:  Debridement removed 6 mm x 4 mm of the necrotic tissue and subcutaneous tissue.  Purulent drainage not present. Nonviable tissue present at the base of the wound.  Sharp debridement was performed to remove the necrotic tissue/nonviable tissue back to viable tissue.  No devitalized/nonviable tissue present postdebridement.  Wound appeared clean and clear of infection No material in the wound was present that was identified to be inhibiting healing. Dressing: Dry, sterile, compression dressing. Disposition: Patient tolerated procedure well.   Return 2 weeks f/u ulcer

## 2024-02-04 ENCOUNTER — Other Ambulatory Visit: Payer: Self-pay | Admitting: Internal Medicine

## 2024-02-04 ENCOUNTER — Ambulatory Visit: Admitting: Podiatry

## 2024-02-05 ENCOUNTER — Other Ambulatory Visit: Payer: Self-pay

## 2024-02-13 ENCOUNTER — Ambulatory Visit: Payer: Self-pay | Admitting: Internal Medicine

## 2024-02-13 ENCOUNTER — Telehealth: Payer: Self-pay | Admitting: Nurse Practitioner

## 2024-02-13 ENCOUNTER — Ambulatory Visit (INDEPENDENT_AMBULATORY_CARE_PROVIDER_SITE_OTHER): Admitting: Internal Medicine

## 2024-02-13 ENCOUNTER — Ambulatory Visit

## 2024-02-13 ENCOUNTER — Encounter: Payer: Self-pay | Admitting: Internal Medicine

## 2024-02-13 VITALS — BP 130/78 | HR 100 | Temp 98.2°F | Ht 68.0 in | Wt 174.0 lb

## 2024-02-13 DIAGNOSIS — E538 Deficiency of other specified B group vitamins: Secondary | ICD-10-CM

## 2024-02-13 DIAGNOSIS — Z23 Encounter for immunization: Secondary | ICD-10-CM

## 2024-02-13 DIAGNOSIS — Z7985 Long-term (current) use of injectable non-insulin antidiabetic drugs: Secondary | ICD-10-CM

## 2024-02-13 DIAGNOSIS — I10 Essential (primary) hypertension: Secondary | ICD-10-CM | POA: Diagnosis not present

## 2024-02-13 DIAGNOSIS — E7800 Pure hypercholesterolemia, unspecified: Secondary | ICD-10-CM | POA: Diagnosis not present

## 2024-02-13 DIAGNOSIS — M25511 Pain in right shoulder: Secondary | ICD-10-CM | POA: Diagnosis not present

## 2024-02-13 DIAGNOSIS — E1165 Type 2 diabetes mellitus with hyperglycemia: Secondary | ICD-10-CM | POA: Diagnosis not present

## 2024-02-13 DIAGNOSIS — Z7984 Long term (current) use of oral hypoglycemic drugs: Secondary | ICD-10-CM

## 2024-02-13 DIAGNOSIS — J0140 Acute pansinusitis, unspecified: Secondary | ICD-10-CM | POA: Diagnosis not present

## 2024-02-13 DIAGNOSIS — G8929 Other chronic pain: Secondary | ICD-10-CM | POA: Diagnosis not present

## 2024-02-13 DIAGNOSIS — E039 Hypothyroidism, unspecified: Secondary | ICD-10-CM | POA: Diagnosis not present

## 2024-02-13 DIAGNOSIS — E559 Vitamin D deficiency, unspecified: Secondary | ICD-10-CM

## 2024-02-13 DIAGNOSIS — G4733 Obstructive sleep apnea (adult) (pediatric): Secondary | ICD-10-CM

## 2024-02-13 LAB — CBC WITH DIFFERENTIAL/PLATELET
Basophils Absolute: 0.1 K/uL (ref 0.0–0.1)
Basophils Relative: 0.6 % (ref 0.0–3.0)
Eosinophils Absolute: 0.4 K/uL (ref 0.0–0.7)
Eosinophils Relative: 3.2 % (ref 0.0–5.0)
HCT: 44.2 % (ref 36.0–46.0)
Hemoglobin: 14.9 g/dL (ref 12.0–15.0)
Lymphocytes Relative: 25.2 % (ref 12.0–46.0)
Lymphs Abs: 2.9 K/uL (ref 0.7–4.0)
MCHC: 33.8 g/dL (ref 30.0–36.0)
MCV: 86.9 fl (ref 78.0–100.0)
Monocytes Absolute: 0.6 K/uL (ref 0.1–1.0)
Monocytes Relative: 5.7 % (ref 3.0–12.0)
Neutro Abs: 7.5 K/uL (ref 1.4–7.7)
Neutrophils Relative %: 65.3 % (ref 43.0–77.0)
Platelets: 265 K/uL (ref 150.0–400.0)
RBC: 5.08 Mil/uL (ref 3.87–5.11)
RDW: 13.9 % (ref 11.5–15.5)
WBC: 11.4 K/uL — ABNORMAL HIGH (ref 4.0–10.5)

## 2024-02-13 LAB — VITAMIN B12: Vitamin B-12: 767 pg/mL (ref 211–911)

## 2024-02-13 LAB — LIPID PANEL
Cholesterol: 110 mg/dL (ref 28–200)
HDL: 52 mg/dL (ref >39.00)
LDL Cholesterol: 35 mg/dL (ref 10–99)
NonHDL: 57.83
Total CHOL/HDL Ratio: 2
Triglycerides: 115 mg/dL (ref 10.0–149.0)
VLDL: 23 mg/dL (ref 0.0–40.0)

## 2024-02-13 LAB — MICROALBUMIN / CREATININE URINE RATIO
Creatinine,U: 214 mg/dL
Microalb Creat Ratio: 117.7 mg/g — ABNORMAL HIGH (ref 0.0–30.0)
Microalb, Ur: 25.2 mg/dL — ABNORMAL HIGH (ref 0.7–1.9)

## 2024-02-13 LAB — VITAMIN D 25 HYDROXY (VIT D DEFICIENCY, FRACTURES): VITD: 82.29 ng/mL (ref 30.00–100.00)

## 2024-02-13 LAB — HEPATIC FUNCTION PANEL
ALT: 9 U/L (ref 3–35)
AST: 16 U/L (ref 5–37)
Albumin: 4.6 g/dL (ref 3.5–5.2)
Alkaline Phosphatase: 58 U/L (ref 39–117)
Bilirubin, Direct: 0.3 mg/dL (ref 0.1–0.3)
Total Bilirubin: 1.7 mg/dL — ABNORMAL HIGH (ref 0.2–1.2)
Total Protein: 7.2 g/dL (ref 6.0–8.3)

## 2024-02-13 LAB — BASIC METABOLIC PANEL WITH GFR
BUN: 13 mg/dL (ref 6–23)
CO2: 26 meq/L (ref 19–32)
Calcium: 9.4 mg/dL (ref 8.4–10.5)
Chloride: 104 meq/L (ref 96–112)
Creatinine, Ser: 0.86 mg/dL (ref 0.40–1.20)
GFR: 68.89 mL/min (ref >60.00)
Glucose, Bld: 88 mg/dL (ref 70–99)
Potassium: 4.1 meq/L (ref 3.5–5.1)
Sodium: 143 meq/L (ref 135–145)

## 2024-02-13 LAB — URINALYSIS, ROUTINE W REFLEX MICROSCOPIC
Hgb urine dipstick: NEGATIVE
Nitrite: NEGATIVE
Specific Gravity, Urine: 1.025 (ref 1.000–1.030)
Total Protein, Urine: 100 — AB
Urine Glucose: >=1000 — AB
Urobilinogen, UA: 1 (ref 0.0–1.0)
pH: 6 (ref 5.0–8.0)

## 2024-02-13 LAB — TSH: TSH: 0.61 u[IU]/mL (ref 0.35–5.50)

## 2024-02-13 LAB — HEMOGLOBIN A1C: Hgb A1c MFr Bld: 4.3 % — ABNORMAL LOW (ref 4.6–6.5)

## 2024-02-13 MED ORDER — CEFDINIR 300 MG PO CAPS: 300.0000 mg | ORAL_CAPSULE | Freq: Two times a day (BID) | ORAL | 0 refills | Status: AC

## 2024-02-13 NOTE — Assessment & Plan Note (Signed)
 Lab Results  Component Value Date   VITAMINB12 174 (L) 09/05/2023   Low, to start oral replacement - b12 1000 mcg qd

## 2024-02-13 NOTE — Assessment & Plan Note (Signed)
 Mild to mod, for antibx course omnicef  300 bid course,  to f/u any worsening symptoms or concerns

## 2024-02-13 NOTE — Assessment & Plan Note (Signed)
 Last vitamin D  Lab Results  Component Value Date   VD25OH 30.29 09/05/2023   Low, to start oral replacement

## 2024-02-13 NOTE — Assessment & Plan Note (Signed)
 Lab Results  Component Value Date   TSH 0.44 09/05/2023   Stable, pt to continue levothyroxine  112 mcg qd

## 2024-02-13 NOTE — Assessment & Plan Note (Signed)
 With hyperglycemia, without insulin   Lab Results  Component Value Date   HGBA1C 5.2 09/05/2023   Stable, pt to continue current medical treatment farxiga  10 every day, metformin  ER 500 mg - 2 bid, mounjaro 5 mg weekly

## 2024-02-13 NOTE — Assessment & Plan Note (Signed)
 Lab Results  Component Value Date   LDLCALC 49 09/05/2023   Stable, pt to continue current statin lipitor 40 mg qd

## 2024-02-13 NOTE — Patient Instructions (Addendum)
 Please take all new medication as prescribed - the antibiotic  Please continue all other medications as before, and refills have been done if requested.  Please have the pharmacy call with any other refills you may need.  Please continue your efforts at being more active, low cholesterol diet, and weight control.  Please keep your appointments with your specialists as you may have planned  Please also stop by the Sports Medicine to make an appt for the right shoulder  Please go to the XRAY Department in the first floor for the x-ray testing  Please go to the LAB at the blood drawing area for the tests to be done  You will be contacted by phone if any changes need to be made immediately.  Otherwise, you will receive a letter about your results with an explanation, but please check with MyChart first.

## 2024-02-13 NOTE — Assessment & Plan Note (Signed)
 BP Readings from Last 3 Encounters:  02/13/24 130/78  01/01/24 124/77  09/05/23 122/66   Stable, pt to continue medical treatment losartan  100 mg every day, toprol  xl 12.5 qd

## 2024-02-13 NOTE — Telephone Encounter (Signed)
 I called and spoke with patient, she is using an extra CPAP machine that her husband has, so she is wearing the CPAP machine.  She spoke with [employer] and was told her machine is >69 years old.  She would like to get a new machine if she could. I told her I would place the order now and have Katie sign it.  She verbalized understanding.   Per Katie's note from 11/5, she was going to order a new machine, so I am placing the order now for a new machine.  Nothing further needed.

## 2024-02-13 NOTE — Assessment & Plan Note (Signed)
 With 3 mo worsening pain, pt with impingement sign, likely bursitis - pt encourage to make appt with sports medicine todayt

## 2024-02-13 NOTE — Progress Notes (Signed)
 Patient ID: Savannah [name], female   DOB: 05-May-1955, 68 y.o.   MRN: [id number]        Chief Complaint: follow up sinusitis, fatigue and hypersomnolence, low vit d and b12, right shoulder pain       HPI:  Savannah [name] is a 68 y.o. female here overall doing ok, but  Here with 2-3 days acute onset fever, facial pain, pressure, headache, general weakness and malaise, and greenish d/c, with mild ST and cough, but pt denies chest pain, wheezing, increased sob or doe, orthopnea, PND, increased LE swelling, palpitations, dizziness or syncope.   Pt denies polydipsia, polyuria, or new focal neuro s/s.    Pt denies fever, wt loss, night sweats, loss of appetite, or other constitutional symptoms  Has been using CPAP nightly and has recent wt loss, does not believe this is causing fatigue.   Does also have 3 mo right shoulder pain and soreness, worse to abduct to 90 degrees or lie on at night.  No falls or trauma       Wt Readings from Last 3 Encounters:  02/13/24 174 lb (78.9 kg)  01/01/24 276 lb (125.2 kg)  10/03/23 197 lb (89.4 kg)   BP Readings from Last 3 Encounters:  02/13/24 130/78  01/01/24 124/77  09/05/23 122/66         Past Medical History:  Diagnosis Date   Allergic rhinitis    Arthritis    arthritis in back   Asthma    B12 deficiency    Cancer (HCC) 2016   kidney    CKD (chronic kidney disease) 04/20/2020   stage 3 per OV note on 04/20/20 from Dr. 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   Hypercholesterolemia    Hypertension    Hypothyroidism    endocrinologist -  dr ellision   Iron deficiency anemia    Lumbar spondylosis    NAFLD (nonalcoholic fatty liver disease)    dx 2010--- last hepatic  panel in epic dated 04-17-2017   Nocturia    OA (osteoarthritis of spine)    Lumbar   OSA on CPAP pulmologist-  dr shellia   sleep study 09-11-2004  very severe osa , AHI 119/hr,  setting 10   Peripheral neuropathy    bottom of feet   Renal calculus, right    Sleep apnea    uses CPAP   Type 2 diabetes mellitus (HCC)    endocrinoloigst-  dr kassie,  last A1c 5.9 on 02-20-219 in epic   Vitamin B 12 deficiency    Wears glasses    Past Surgical History:  Procedure Laterality Date   BUNIONECTOMY/  HAMMERTOE CORRECTION'S  09-07-2013   SCG   RIGHT FOOT 2ND, 3RD, 4TH, 5TH DIGITS   CARDIAC CATHETERIZATION  06-01-2009  dr verlin   no evidence CAD,  normal LVSF, elevated blood pressure w/ elevated end-diastolic pressure, ef 60-65%   CARDIOVASCULAR STRESS TEST  05-11-2009   dr verlin   abnormal lexiscan  nuclear study (no exercise) w/ mild ischemia in the distal anteroseptal wall and apex, this defect may be due to shifting breast attenuation   COLONOSCOPY  last 02/08/2014  COLONOSCOPY WITH SNARE POLYPECTOMY WITH ESOPHAGOGASTRODUODENOSCOPY  02/08/2014   with biopsy Dr. CHRISTELLA. Aneita   CYSTOSCOPY WITH RETROGRADE PYELOGRAM, URETEROSCOPY AND STENT PLACEMENT Right 06/07/2017   Procedure: CYSTOSCOPY WITH RETROGRADE PYELOGRAM, URETEROSCOPY, LITHOTRIPSY,  AND STENT PLACEMENT 1st STAGE;  Surgeon: Alvaro Hummer, MD;  Location: [facility];  Service: Urology;  Laterality: Right;   CYSTOSCOPY WITH RETROGRADE PYELOGRAM, URETEROSCOPY AND STENT PLACEMENT Right 06/21/2017   Procedure: CYSTOSCOPY WITH RETROGRADE PYELOGRAM, URETEROSCOPY AND STENT PLACEMENT 2ND STAGE  STENT EXCHANGE;  Surgeon: Alvaro Hummer, MD;  Location: [facility];  Service: Urology;  Laterality: Right;   CYSTOSCOPY WITH RETROGRADE PYELOGRAM, URETEROSCOPY AND STENT PLACEMENT Right 01/13/2021   Procedure: FIRST STAGE CYSTOSCOPY WITH RETROGRADE PYELOGRAM, URETEROSCOPY AND STENT PLACEMENT;  Surgeon: Alvaro Hummer, MD;  Location: [facility];  Service: Urology;  Laterality: Right;  90 MINS   CYSTOSCOPY WITH RETROGRADE PYELOGRAM, URETEROSCOPY AND STENT PLACEMENT Right 01/27/2021   Procedure: SECOND STAGE CYSTOSCOPY WITH RETROGRADE PYELOGRAM, URETEROSCOPY AND STENT EXCHANGE;  Surgeon: Alvaro Hummer, MD;  Location: [facility];  Service: Urology;  Laterality: Right;  90 MINS   FRACTURE SURGERY Right    Compound fracture foot   FRACTURE SURGERY Right 1963   Pt had surgery to repair broken arm.   HOLMIUM LASER APPLICATION Right 06/07/2017   Procedure: HOLMIUM LASER APPLICATION;  Surgeon: Alvaro Hummer, MD;  Location: [facility];  Service: Urology;  Laterality: Right;   HOLMIUM LASER APPLICATION Right 06/21/2017   Procedure: HOLMIUM LASER APPLICATION;  Surgeon: Alvaro Hummer, MD;  Location: [facility];  Service: Urology;  Laterality: Right;   HOLMIUM LASER APPLICATION Right 01/13/2021   Procedure: HOLMIUM LASER APPLICATION;  Surgeon: Alvaro Hummer, MD;  Location: [facility];  Service: Urology;  Laterality: Right;   HOLMIUM LASER APPLICATION Right 01/27/2021   Procedure: HOLMIUM LASER APPLICATION;  Surgeon: Alvaro Hummer, MD;  Location: [facility];  Service: Urology;  Laterality: Right;   KNEE ARTHROSCOPY Left 1993   POLYPECTOMY     ROBOTIC ASSITED PARTIAL NEPHRECTOMY Right 05/19/2014   Procedure: ROBOTIC ASSITED PARTIAL NEPHRECTOMY;  Surgeon: Hummer Alvaro, MD;  Location: [facility];  Service: Urology;  Laterality: Right;    reports that she quit smoking about 48 years ago. Her smoking use included cigarettes. She started smoking about 50 years ago. She has a 0.6 pack-year smoking history. She has never used smokeless tobacco. She reports that she does not drink alcohol and does not use drugs. family history includes Allergies in her sister; Asthma in her sister; Clotting disorder in her grandchild; Colon cancer (age of onset: 28) in her  mother; Colon polyps in her mother; Diabetes in her mother; Heart attack in her father; Heart disease in her father and paternal grandmother; Kidney disease in her mother. Allergies[1] Medications Ordered Prior to Encounter[2]      ROS:  All others reviewed and negative.  Objective        PE:  BP 130/78 (BP Location: Right Arm, Patient Position: Sitting, Cuff Size: Normal)   Pulse 100   Temp 98.2 F (36.8 C) (Oral)   Ht 5' 8 (1.727 m)   Wt 174 lb (78.9 kg)   SpO2 99%   BMI 26.46 kg/m                 Constitutional: Pt appears mild ill               HENT: Head: NCAT.  Right Ear: External ear normal.                 Left Ear: External ear normal. Bilat tm's with mild erythema.  Max sinus areas mild tender.  Pharynx with mild erythema, no exudate                 Eyes: . Pupils are equal, round, and reactive to light. Conjunctivae and EOM are normal               Nose: without d/c or deformity               Neck: Neck supple. Gross normal ROM               Cardiovascular: Normal rate and regular rhythm.                 Pulmonary/Chest: Effort normal and breath sounds without rales or wheezing.                Abd:  Soft, NT, ND, + BS, no organomegaly               Neurological: Pt is alert. At baseline orientation, motor grossly intact  Right shoulder with marked tender and mild swelling to the subacromlal area with impingement sign.                Skin: Skin is warm. No rashes, no other new lesions, LE edema - none               Psychiatric: Pt behavior is normal without agitation   Micro: none  Cardiac tracings I have personally interpreted today:  none  Pertinent Radiological findings (summarize): none   Lab Results  Component Value Date   WBC 9.1 09/05/2023   HGB 14.2 09/05/2023   HCT 41.4 09/05/2023   PLT 235.0 09/05/2023   GLUCOSE 104 (H) 09/05/2023   CHOL 131 09/05/2023   TRIG 141.0 09/05/2023   HDL 53.50 09/05/2023   LDLDIRECT 58.0 04/20/2021    LDLCALC 49 09/05/2023   ALT 14 09/05/2023   AST 18 09/05/2023   NA 137 09/05/2023   K 4.1 09/05/2023   CL 100 09/05/2023   CREATININE 1.03 09/05/2023   BUN 23 09/05/2023   CO2 27 09/05/2023   TSH 0.44 09/05/2023   INR 1.1 ratio (H) 05/23/2009   HGBA1C 5.2 09/05/2023   MICROALBUR 1.4 09/05/2023   Assessment/Plan:  IZABELLE DAUS is a 68 y.o. White or Caucasian [1] female with  has a past medical history of Allergic rhinitis, Arthritis, Asthma, B12 deficiency, Cancer (HCC) (2016), CKD (chronic kidney disease) (04/20/2020), Depression, Grade I internal hemorrhoids, Hiatal hernia, History of colon polyps, History of kidney stones, History of renal cell carcinoma (urologist-  dr alvaro), Hypercholesterolemia, Hypertension, Hypothyroidism, Iron deficiency anemia, Lumbar spondylosis, NAFLD (nonalcoholic fatty liver disease), Nocturia, OA (osteoarthritis of spine), OSA on CPAP (pulmologist-  dr shellia), Peripheral neuropathy, Renal calculus, right, Sleep apnea, Type 2 diabetes mellitus (HCC), Vitamin B 12 deficiency, and Wears glasses.  B12 deficiency Lab Results  Component Value Date   VITAMINB12 174 (L) 09/05/2023   Low, to start oral replacement - b12 1000 mcg qd   Diabetes (HCC) With hyperglycemia, without insulin   Lab Results  Component Value Date   HGBA1C 5.2 09/05/2023   Stable, pt to continue current medical treatment farxiga  10 every day, metformin  ER 500 mg - 2 bid, mounjaro 5 mg weekly   Essential hypertension BP Readings from  Last 3 Encounters:  02/13/24 130/78  01/01/24 124/77  09/05/23 122/66   Stable, pt to continue medical treatment losartan  100 mg every day, toprol  xl 12.5 qd   HYPERCHOLESTEROLEMIA Lab Results  Component Value Date   LDLCALC 49 09/05/2023   Stable, pt to continue current statin lipitor 40 mg qd   Hypothyroidism Lab Results  Component Value Date   TSH 0.44 09/05/2023   Stable, pt to continue levothyroxine  112 mcg qd   Vitamin D   deficiency Last vitamin D  Lab Results  Component Value Date   VD25OH 30.29 09/05/2023   Low, to start oral replacement   Chronic right shoulder pain With 3 mo worsening pain, pt with impingement sign, likely bursitis - pt encourage to make appt with sports medicine todayt  Sinusitis, acute Mild to mod, for antibx course omnicef  300 bid course,  to f/u any worsening symptoms or concerns  Followup: Return in about 6 months (around 08/13/2024).  Lynwood Rush, MD 02/13/2024 1:13 PM [facility]     [1]  Allergies Allergen Reactions   Erythromycin Nausea And Vomiting   Zolpidem  Other (See Comments)    Up at night, fallling  [2]  Current Outpatient Medications on File Prior to Visit  Medication Sig Dispense Refill   albuterol  (PROAIR  HFA) 108 (90 Base) MCG/ACT inhaler Inhale 2 puffs into the lungs every 4 (four) hours as needed for wheezing or shortness of breath (cough). 3 each 3   atorvastatin  (LIPITOR) 40 MG tablet Take 1 tablet (40 mg total) by mouth daily. 90 tablet 3   cholecalciferol (VITAMIN D ) 1000 units tablet Take 1,000 Units by mouth daily.     citalopram  (CELEXA ) 20 MG tablet TAKE 1 TABLET BY MOUTH EVERY DAY IN THE MORNING 90 tablet 2   collagenase (SANTYL) ointment Apply 1 application topically daily.     eszopiclone  (LUNESTA ) 2 MG TABS tablet TAKE 1 TABLET BY MOUTH IMMEDIATELY BEFORE BEDTIME AS NEEDED FOR SLEEP 90 tablet 1   FARXIGA  10 MG TABS tablet TAKE 1 TABLET BY MOUTH DAILY BEFORE BREAKFAST. 90 tablet 3   fluticasone -salmeterol (ADVAIR) 100-50 MCG/ACT AEPB Inhale 1 puff into the lungs 2 (two) times daily. 3 each 3   gabapentin  (NEURONTIN ) 300 MG capsule TAKE 1 CAPSULE BY MOUTH EVERYDAY AT BEDTIME 90 capsule 1   levothyroxine  (SYNTHROID ) 112 MCG tablet TAKE 1 TABLET BY MOUTH EVERY DAY 90 tablet 3   losartan  (COZAAR ) 100 MG tablet TAKE 1 TABLET BY MOUTH EVERY DAY 90 tablet 3   metFORMIN   (GLUCOPHAGE -XR) 500 MG 24 hr tablet TAKE 2 TABLETS BY MOUTH TWICE A DAY 360 tablet 3   metoprolol  succinate (TOPROL -XL) 25 MG 24 hr tablet TAKE 1/2 TABLET BY MOUTH DAILY 45 tablet 1   MOUNJARO 2.5 MG/0.5ML Pen SMARTSIG:2.5 Milligram(s) SUB-Q Once a Week     MOUNJARO 5 MG/0.5ML Pen SMARTSIG:5 Milligram(s) SUB-Q Once a Week     ondansetron  (ZOFRAN -ODT) 4 MG disintegrating tablet Take 1 tablet (4 mg total) by mouth every 8 (eight) hours as needed for nausea or vomiting. 20 tablet 0   Oxycodone  HCl 10 MG TABS Take 1 tablet (10 mg total) by mouth every 6 (six) hours as needed (breakthrogh pain post-operatively). 10 tablet 0   RESTASIS 0.05 % ophthalmic emulsion Place 1 drop into both eyes 2 (two) times daily.     SUMAtriptan  (IMITREX ) 100 MG tablet Take 1 tablet (100 mg total) by mouth every 2 (two) hours  as needed for migraine or headache. May repeat in 2 hours if headache persists or recurs. 10 tablet 5   No current facility-administered medications on file prior to visit.

## 2024-02-13 NOTE — Telephone Encounter (Signed)
 Copied from CRM #[id number]. Topic: Clinical - Order For Equipment >> Feb 13, 2024 12:45 PM Rozanna G wrote: Reason for CRM: pt stated her CPAP machine is having some malfunctioning. Stated the pressure is not good. And stated she advised provider on 12/2023, but stated she called [facility] and they do not have any record about machine malfunctioning.

## 2024-02-14 ENCOUNTER — Telehealth: Payer: Self-pay | Admitting: *Deleted

## 2024-02-14 NOTE — Telephone Encounter (Signed)
 Pts weight updated. LM   Copied from CRM #[id number]. Topic: Clinical - Medication Question >> Feb 13, 2024 12:49 PM Rozanna G wrote: Reason for CRM: pt wants to know if someone change her weight from 11/05 visit which she stated is incorrect and should 174. Thanks

## 2024-03-01 ENCOUNTER — Other Ambulatory Visit: Payer: Self-pay | Admitting: Internal Medicine

## 2024-03-01 DIAGNOSIS — G8929 Other chronic pain: Secondary | ICD-10-CM

## 2024-03-09 ENCOUNTER — Emergency Department (HOSPITAL_COMMUNITY)

## 2024-03-09 ENCOUNTER — Emergency Department (HOSPITAL_COMMUNITY)
Admission: EM | Admit: 2024-03-09 | Discharge: 2024-03-09 | Disposition: A | Discharge status: Home / Self Care | Attending: Emergency Medicine | Admitting: Emergency Medicine

## 2024-03-09 ENCOUNTER — Other Ambulatory Visit: Payer: Self-pay

## 2024-03-09 ENCOUNTER — Ambulatory Visit: Admitting: Internal Medicine

## 2024-03-09 DIAGNOSIS — N132 Hydronephrosis with renal and ureteral calculous obstruction: Secondary | ICD-10-CM | POA: Insufficient documentation

## 2024-03-09 DIAGNOSIS — W1830XA Fall on same level, unspecified, initial encounter: Secondary | ICD-10-CM | POA: Diagnosis not present

## 2024-03-09 DIAGNOSIS — M542 Cervicalgia: Secondary | ICD-10-CM | POA: Insufficient documentation

## 2024-03-09 DIAGNOSIS — N183 Chronic kidney disease, stage 3 unspecified: Secondary | ICD-10-CM | POA: Insufficient documentation

## 2024-03-09 DIAGNOSIS — E039 Hypothyroidism, unspecified: Secondary | ICD-10-CM | POA: Insufficient documentation

## 2024-03-09 DIAGNOSIS — I129 Hypertensive chronic kidney disease with stage 1 through stage 4 chronic kidney disease, or unspecified chronic kidney disease: Secondary | ICD-10-CM | POA: Diagnosis not present

## 2024-03-09 DIAGNOSIS — Z79890 Hormone replacement therapy (postmenopausal): Secondary | ICD-10-CM | POA: Insufficient documentation

## 2024-03-09 DIAGNOSIS — E1122 Type 2 diabetes mellitus with diabetic chronic kidney disease: Secondary | ICD-10-CM | POA: Insufficient documentation

## 2024-03-09 DIAGNOSIS — Z7984 Long term (current) use of oral hypoglycemic drugs: Secondary | ICD-10-CM | POA: Insufficient documentation

## 2024-03-09 DIAGNOSIS — N200 Calculus of kidney: Secondary | ICD-10-CM

## 2024-03-09 DIAGNOSIS — M545 Low back pain, unspecified: Secondary | ICD-10-CM | POA: Diagnosis present

## 2024-03-09 DIAGNOSIS — W19XXXA Unspecified fall, initial encounter: Secondary | ICD-10-CM

## 2024-03-09 MED ORDER — KETOROLAC TROMETHAMINE 15 MG/ML IJ SOLN
15.0000 mg | Freq: Once | INTRAMUSCULAR | Status: AC
  Administered 2024-03-09: 15 mg via INTRAMUSCULAR
  Filled 2024-03-09: qty 1

## 2024-03-09 NOTE — ED Provider Notes (Signed)
 " [place] EMERGENCY DEPARTMENT AT [facility] Provider Note   CSN: [id number] Arrival date & time: 03/09/24  1616     Patient presents with: Savannah [name] is a 69 y.o. female.  With pertinent medical history of diabetes, hypertension, hypothyroidism, iron deficiency anemia, OSA, asthma, lumbar spondylosis, chronic lumbar back pain, stage III CKD, cervical radiculopathy, hearing loss.   Patient is here for evaluation of pain after fall.  She had a mechanical fall earlier today while making her bed.  She fell onto a wood floor hitting her bottom and then falling back hitting her cervical spine on a nearby nightstand.  Denies head injury or LOC.  Not taking blood thinners.  Complaining of lumbar back pain and cervical spine pain as well as pain to bilateral neck.  She denies numbness or tingling of the lower extremities.  Denies saddle anesthesia, urinary or fecal incontinence.  She was able to stand with assistance off the floor by EMS personnel.  She has been able to stand and pivot with some discomfort.   Denies urinary symptoms, dysuria and hematuria.  Patient informed of kidney stone found on imaging today.  She is already aware of this kidney stone as she saw her nephrologist last week.  The history is provided by the patient.  Fall This is a new problem.       Prior to Admission medications  Medication Sig Start Date End Date Taking? Authorizing Provider  albuterol  (PROAIR  HFA) 108 (90 Base) MCG/ACT inhaler Inhale 2 puffs into the lungs every 4 (four) hours as needed for wheezing or shortness of breath (cough). 08/04/20   Norleen Lynwood LELON, MD  atorvastatin  (LIPITOR) 40 MG tablet Take 1 tablet (40 mg total) by mouth daily. 09/05/23   Norleen Lynwood LELON, MD  cefdinir  (OMNICEF ) 300 MG capsule Take 1 capsule (300 mg total) by mouth 2 (two) times daily. 02/13/24   Norleen Lynwood LELON, MD  cholecalciferol (VITAMIN D ) 1000 units tablet Take 1,000 Units by mouth daily.    [provider]  citalopram  (CELEXA ) 20 MG tablet TAKE 1 TABLET BY MOUTH EVERY DAY IN THE MORNING 12/20/23   Norleen Lynwood LELON, MD  collagenase (SANTYL) ointment Apply 1 application topically daily.    [provider]  eszopiclone  (LUNESTA ) 2 MG TABS tablet TAKE 1 TABLET BY MOUTH IMMEDIATELY BEFORE BEDTIME AS NEEDED FOR SLEEP 05/16/22   Norleen Lynwood LELON, MD  FARXIGA  10 MG TABS tablet TAKE 1 TABLET BY MOUTH DAILY BEFORE BREAKFAST. 12/06/23   Norleen Lynwood LELON, MD  fluticasone -salmeterol (ADVAIR) 100-50 MCG/ACT AEPB Inhale 1 puff into the lungs 2 (two) times daily. 08/04/20   Norleen Lynwood LELON, MD  gabapentin  (NEURONTIN ) 300 MG capsule TAKE 1 CAPSULE BY MOUTH EVERYDAY AT BEDTIME 02/05/24   Norleen Lynwood LELON, MD  levothyroxine  (SYNTHROID ) 112 MCG tablet TAKE 1 TABLET BY MOUTH EVERY DAY 09/10/23   Norleen Lynwood LELON, MD  losartan  (COZAAR ) 100 MG tablet TAKE 1 TABLET BY MOUTH EVERY DAY 04/29/23   Norleen Lynwood LELON, MD  metFORMIN  (GLUCOPHAGE -XR) 500 MG 24 hr tablet TAKE 2 TABLETS BY MOUTH TWICE A DAY 10/23/23   Norleen Lynwood LELON, MD  metoprolol  succinate (TOPROL -XL) 25 MG 24 hr tablet TAKE 1/2 TABLET BY MOUTH DAILY 12/30/23   Norleen Lynwood LELON, MD  MOUNJARO 2.5 MG/0.5ML Pen SMARTSIG:2.5 Milligram(s) SUB-Q Once a Week 07/11/23   [provider]  MOUNJARO 5 MG/0.5ML Pen SMARTSIG:5 Milligram(s) SUB-Q Once a Week 02/04/24  [provider]  ondansetron  (ZOFRAN -ODT) 4 MG disintegrating tablet Take 1 tablet (4 mg total) by mouth every 8 (eight) hours as needed for nausea or vomiting. 08/04/20   Norleen Lynwood ORN, MD  Oxycodone  HCl 10 MG TABS Take 1 tablet (10 mg total) by mouth every 6 (six) hours as needed (breakthrogh pain post-operatively). 01/27/21   Alvaro Ricardo KATHEE Mickey., MD  RESTASIS 0.05 % ophthalmic emulsion Place 1 drop into both eyes 2 (two) times daily. 06/14/20   [provider]  SUMAtriptan  (IMITREX ) 100 MG tablet Take 1 tablet (100 mg total) by mouth every 2 (two) hours as needed for migraine or headache. May repeat  in 2 hours if headache persists or recurs. 09/05/23   Norleen Lynwood ORN, MD    Allergies: Erythromycin and Zolpidem     Review of Systems  Musculoskeletal:  Positive for back pain.    Updated Vital Signs BP 130/74   Pulse 98   Temp 98.1 F (36.7 C)   Resp 16   SpO2 100%   Physical Exam Vitals and nursing note reviewed.  Constitutional:      General: She is not in acute distress.    Appearance: Normal appearance. She is not ill-appearing, toxic-appearing or diaphoretic.  HENT:     Head: Normocephalic and atraumatic.     Nose: Nose normal.     Mouth/Throat:     Mouth: Mucous membranes are moist.  Eyes:     General: No scleral icterus.    Extraocular Movements: Extraocular movements intact.     Conjunctiva/sclera: Conjunctivae normal.     Pupils: Pupils are equal, round, and reactive to light.  Cardiovascular:     Rate and Rhythm: Normal rate and regular rhythm.     Pulses: Normal pulses.     Heart sounds: Normal heart sounds.  Pulmonary:     Effort: Pulmonary effort is normal. No respiratory distress.     Breath sounds: Normal breath sounds. No stridor. No wheezing, rhonchi or rales.  Abdominal:     General: Abdomen is flat. There is no distension.     Palpations: Abdomen is soft.     Tenderness: There is no abdominal tenderness. There is no guarding.  Musculoskeletal:        General: Tenderness present. No swelling or deformity.     Cervical back: Normal range of motion and neck supple. Tenderness and bony tenderness present. No swelling, edema, deformity, erythema, lacerations, rigidity, spasms, torticollis or crepitus. Pain with movement present.     Thoracic back: Normal.     Lumbar back: Tenderness and bony tenderness present. No swelling, edema, deformity, lacerations or spasms.     Right hip: No deformity, tenderness, bony tenderness or crepitus. Normal range of motion. Normal strength.     Left hip: No deformity, tenderness, bony tenderness or crepitus. Normal range  of motion. Normal strength.     Right lower leg: No edema.     Left lower leg: No edema.     Comments: Equal strength bilaterally in lower extremities.  Skin:    General: Skin is warm and dry.     Capillary Refill: Capillary refill takes less than 2 seconds.     Coloration: Skin is not jaundiced or pale.  Neurological:     Mental Status: She is alert and oriented to person, place, and time.     Sensory: No sensory deficit.     Motor: No weakness.     Coordination: Coordination normal.     (all  labs ordered are listed, but only abnormal results are displayed) Labs Reviewed - No data to display  EKG: None  Radiology: CT Lumbar Spine Wo Contrast Result Date: 03/09/2024 EXAM: CT OF THE LUMBAR SPINE WITHOUT CONTRAST 03/09/2024 05:55:39 PM TECHNIQUE: CT of the lumbar spine was performed without the administration of intravenous contrast. Multiplanar reformatted images are provided for review. Automated exposure control, iterative reconstruction, and/or weight based adjustment of the mA/kV was utilized to reduce the radiation dose to as low as reasonably achievable. COMPARISON: CT abdomen and pelvis 03/06/2018. CLINICAL HISTORY: Back trauma, no prior imaging. Fall. Lower back and buttock pain. FINDINGS: BONES AND ALIGNMENT: 5 lumbar type vertebrae. Slight lumbar dextroscoliosis. Trace facet mediated anterolisthesis of L4 on L5. No acute fracture or suspicious bone lesion. DEGENERATIVE CHANGES: Focally advanced disc degeneration at L3-L4 with disc bulging and posterior element hypertrophy resulting in moderate spinal stenosis and mild to moderate bilateral neural foraminal stenosis. Markedly severe right greater than left facet arthrosis at L4-L5. Moderate left greater than right neural foraminal stenosis at L5-S1 due to disc bulging and spurring. SOFT TISSUES: 1 cm stone in the proximal to mid right ureter with associated mild hydroureteronephrosis and periureteral stranding. Mild abdominal aortic  atherosclerosis without aneurysm. IMPRESSION: 1. No acute lumbar spine fracture. 2. Moderate spinal stenosis at L3-L4. 3. Severe facet arthrosis at L4-L5. 4. 1 cm stone in the proximal to mid right ureter with mild hydroureteronephrosis. Electronically signed by: Dasie Hamburg MD MD 03/09/2024 07:16 PM EST RP Workstation: [id number]   CT Cervical Spine Wo Contrast Result Date: 03/09/2024 EXAM: CT CERVICAL SPINE WITHOUT CONTRAST 03/09/2024 05:55:39 PM TECHNIQUE: CT of the cervical spine was performed without the administration of intravenous contrast. Multiplanar reformatted images are provided for review. Automated exposure control, iterative reconstruction, and/or weight based adjustment of the mA/kV was utilized to reduce the radiation dose to as low as reasonably achievable. COMPARISON: MRI cervical spine 06/25/2007. CLINICAL HISTORY: Neck trauma (Age >= 65y). Fall. FINDINGS: BONES AND ALIGNMENT: Chronic straightening/slight reversal of the normal cervical lordosis. No traumatic subluxation. No acute fracture or suspicious lesion. DEGENERATIVE CHANGES: Diffuse congenital and acquired spinal stenosis. Prominent disc osteophyte complexes at C5-C6 and C6-C7 resulting in severe spinal stenosis and severe neural foraminal stenosis. SOFT TISSUES: No prevertebral soft tissue swelling. IMPRESSION: 1. No acute cervical spine fracture or traumatic malalignment. 2. Diffuse disc degeneration with severe spinal stenosis at C5-C6 and C6-C7. Electronically signed by: Dasie Hamburg MD MD 03/09/2024 07:10 PM EST RP Workstation: [id number]     Procedures   Medications Ordered in the ED  ketorolac  (TORADOL ) 15 MG/ML injection 15 mg (15 mg Intramuscular Given 03/09/24 1822)    Patient presents to the ED for concern of mechanical fall, this involves an extensive number of treatment options, and is a complaint that carries with it a high risk of complications and morbidity.  The differential diagnosis includes fracture,  dislocation, soft tissue injury, cauda equina syndrome.  Based on HPI, I have a low suspicion of cauda equina syndrome.  Imaging shows no acute findings.  Co morbidities that complicate the patient evaluation  Cervical disorder with radiculopathy Lumbar spondylosis   Imaging Studies ordered:  I ordered imaging studies including,  CT lumbar spine without contrast: No acute spine fracture.  Moderate spinal stenosis at L3 and L4.  Severe facet arthrosis at L4 and L5.  Incidental finding of 1 cm stone in the proximal to mid right ureter with mild hydroureteronephrosis. CT cervical spine without contrast: No acute cervical spine  fracture or traumatic malalignment.  Diffuse disc degeneration with severe spinal stenosis at C5-C6 and C6-C7.   Medicines ordered and prescription drug management:  I ordered medication including toradol   for pain management  Reevaluation of the patient after these medicines showed that the patient improved I have reviewed the patients home medicines and have made adjustments as needed   Problem List / ED Course:     Fall with injury. No obvious deformities and no neuro deficits on physical exam. Imaging showing no acute findings. I do not feel additional emergent work up warranted at this time. Encouraged follow up with primary care for ongoing evaluation. Return precautions discussed and patient verbalized understanding. Incidental kidney stone found on imaging.  Patient already aware of this finding as she met with her nephrologist last week.  She will continue to follow with nephrology.   Reevaluation:  After the interventions noted above, I reevaluated the patient and found that they have :improved   Dispostion:  After consideration of the diagnostic results and the patients response to treatment, I feel that the patent would benefit from supportive care in the home setting and close follow-up with established primary care and nephrology.  Return  precautions given.                                  Medical Decision Making Amount and/or Complexity of Data Reviewed Radiology: ordered.  Risk Prescription drug management.   This note was produced using [employer] Engineer. While the provider has reviewed and verified all clinical information, transcription errors may remain.    Final diagnoses:  Fall, initial encounter  Kidney stone    ED Discharge Orders     None          Savannah [name] 03/10/24 0006    Cottie Donnice PARAS, MD 03/10/24 2307  "

## 2024-03-09 NOTE — ED Triage Notes (Signed)
 atient BIB EMS c/o fall. Patient reports she slipped and fell while making her bed, landing on her buttocks. Patient denies loss of consciousness and denies hitting her head. Patient denies blood thinners. Patient complains of lower back and buttock pain.

## 2024-03-09 NOTE — Discharge Instructions (Addendum)
 It was a pleasure meeting with you today.  As we discussed there were no fractures or dislocations seen on imaging today.  There was a kidney stone seen on imaging.  Follow-up with primary care and nephrology for ongoing management.  Please return to ED if you have any further falls or any other new or concerning symptoms.

## 2024-03-26 ENCOUNTER — Ambulatory Visit: Admitting: Family Medicine
